# Patient Record
Sex: Male | Born: 1955
Health system: Western US, Academic
[De-identification: ages and names within clinical notes are randomized; demographics above are authoritative.]

---

## 2012-01-09 DIAGNOSIS — R269 Unspecified abnormalities of gait and mobility: Secondary | ICD-10-CM

## 2012-03-11 DIAGNOSIS — Z952 Presence of prosthetic heart valve: Secondary | ICD-10-CM

## 2019-12-23 IMAGING — MR COL LOMBAR^ROTINAS
5 series · 35 of 48 positions shown · non-contrast
Comparison: none

[Series 2: sagital_t2 · sagittal · 4.0mm · 0.65mm/px · 8 of 12 slices shown]
[im 1/12]
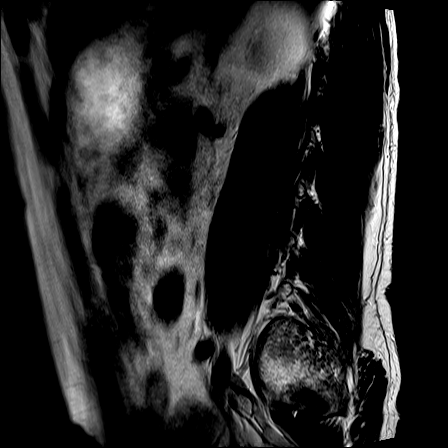
[im 2/12]
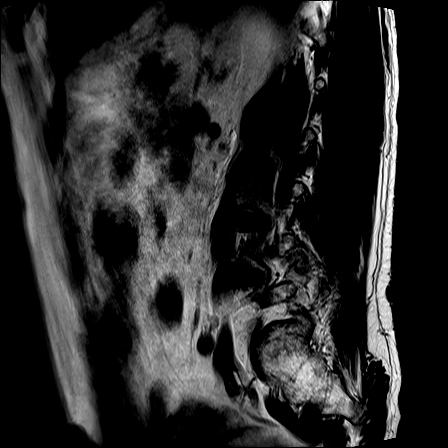
[im 4/12]
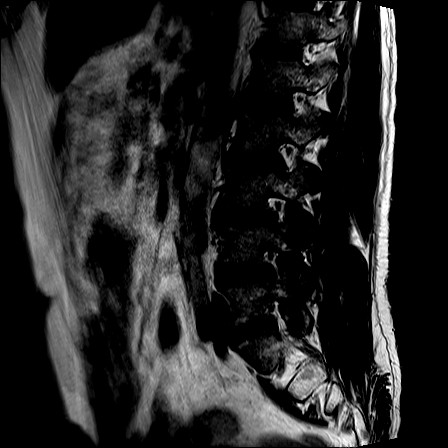
[im 5/12]
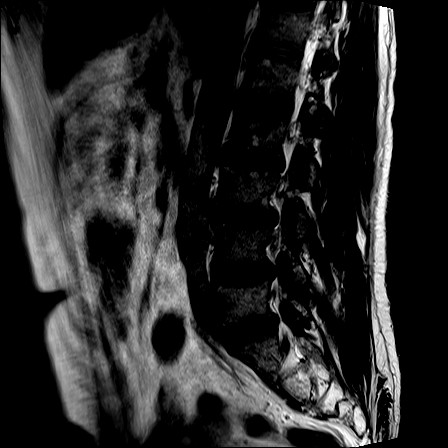
[im 7/12]
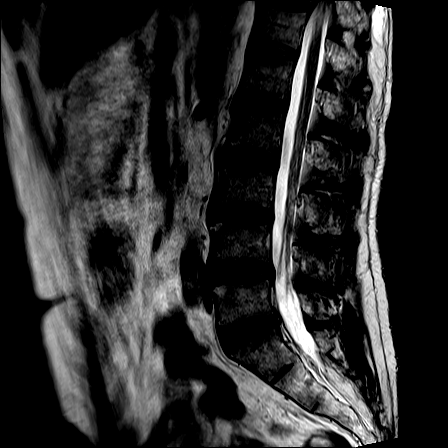
[im 8/12]
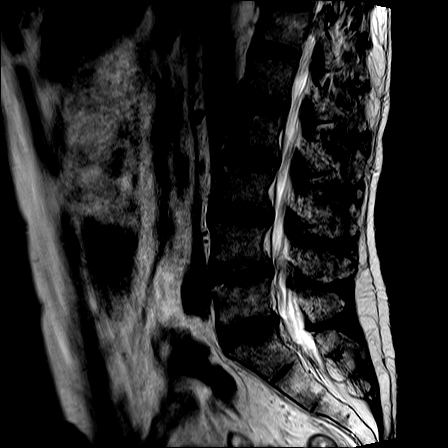
[im 10/12]
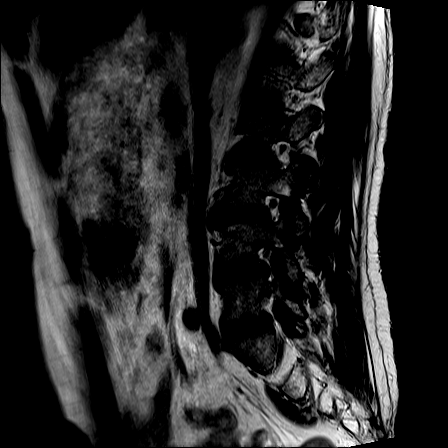
[im 12/12]
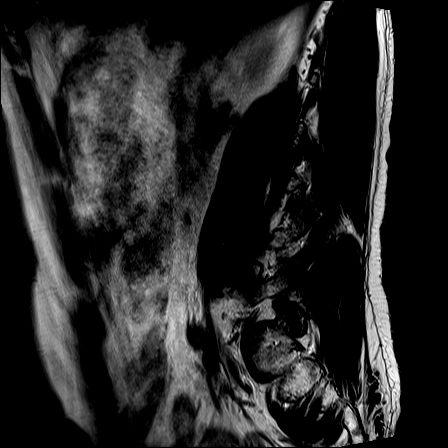

[Series 3: sagital_t1 · sagittal · 4.0mm · 0.76mm/px · 8 of 12 slices shown]
[im 1/12]
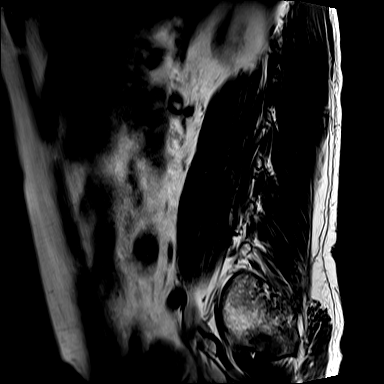
[im 2/12]
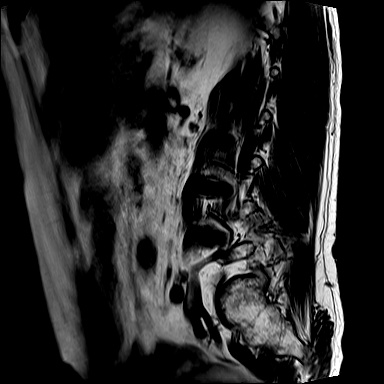
[im 4/12]
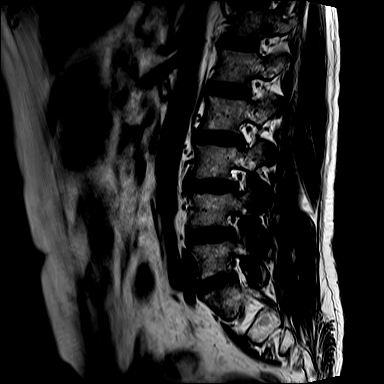
[im 5/12]
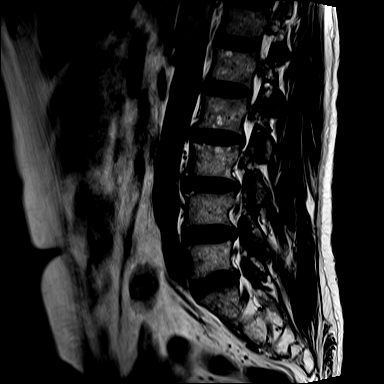
[im 7/12]
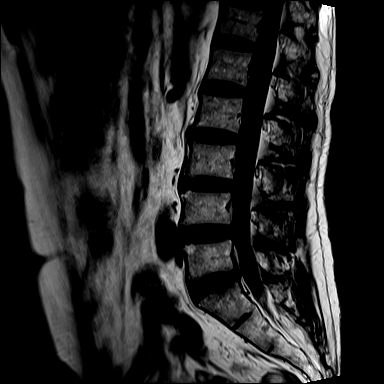
[im 8/12]
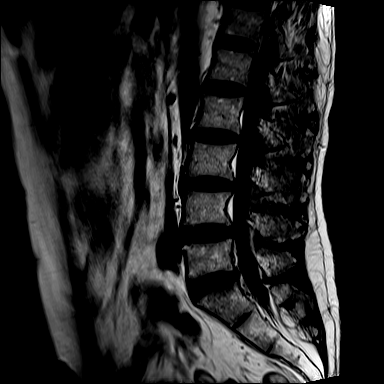
[im 10/12]
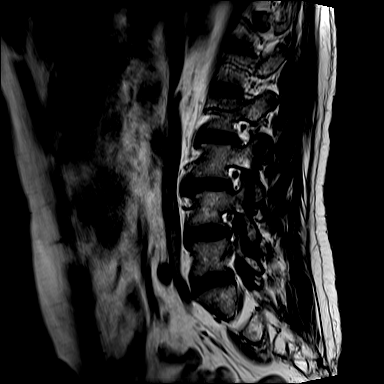
[im 12/12]
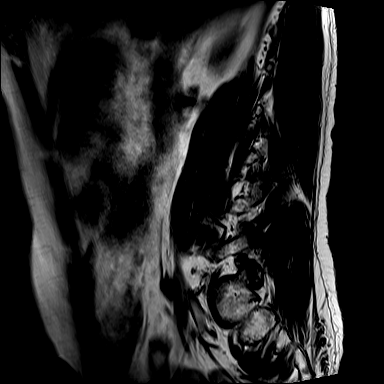

[Series 4: axial_t2_bloco · axial · 4.0mm · 0.62mm/px · z∈[-71,+62]mm · 10 of 30 slices shown]
[im 2/30]
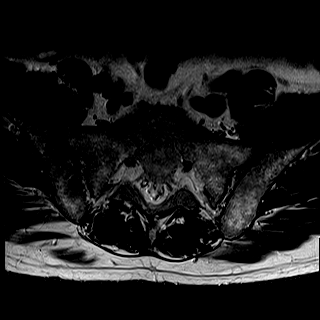
[im 6/30]
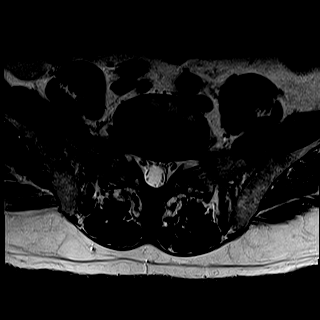
[im 9/30]
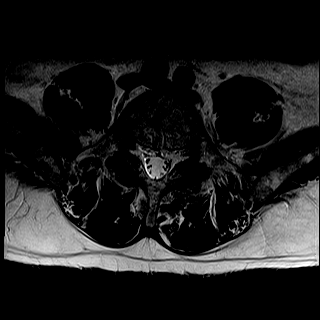
[im 12/30]
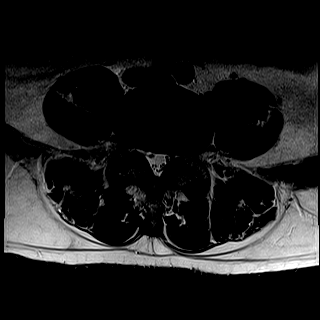
[im 16/30]
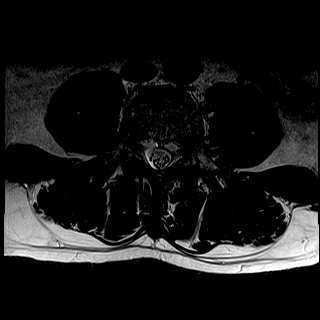
[im 18/30]
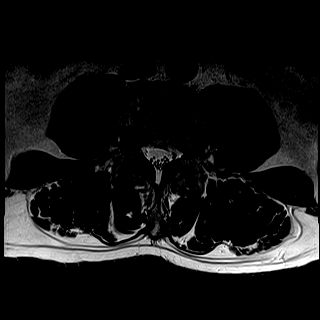
[im 21/30]
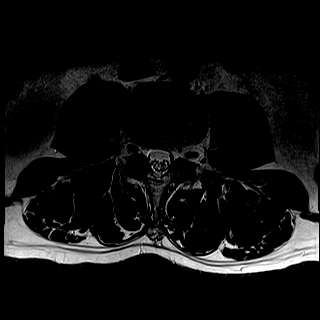
[im 24/30]
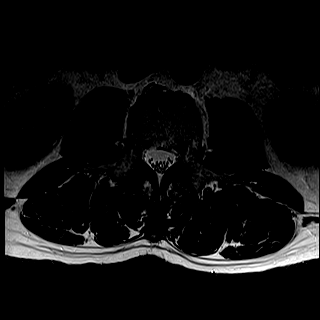
[im 26/30]
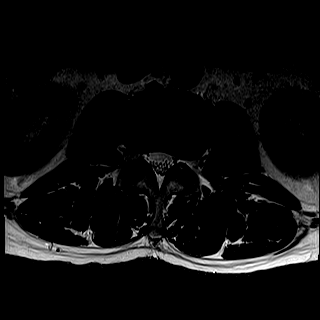
[im 28/30]
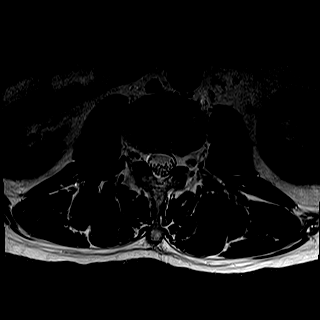

[Series 5: sagital_stir · sagittal · 4.0mm · 0.91mm/px · 7 of 12 slices shown]
[im 1/12]
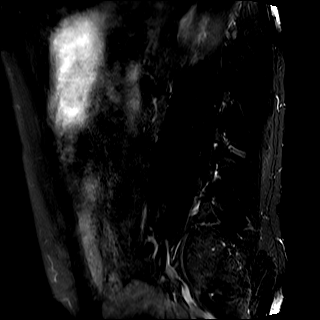
[im 2/12]
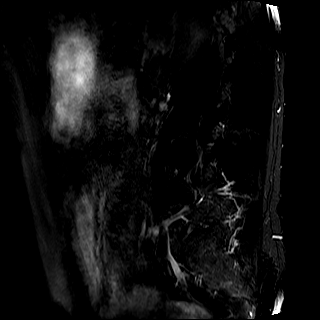
[im 4/12]
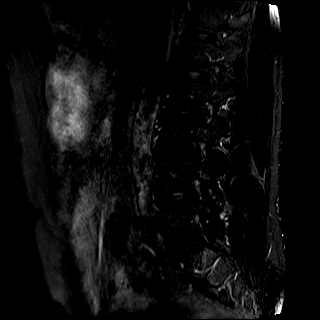
[im 6/12]
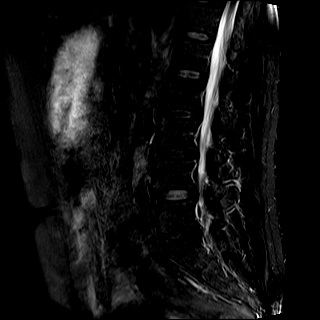
[im 8/12]
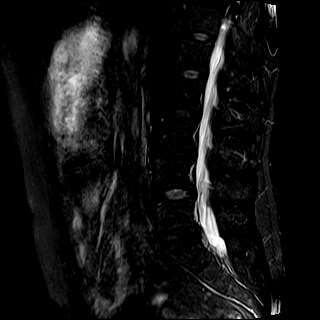
[im 10/12]
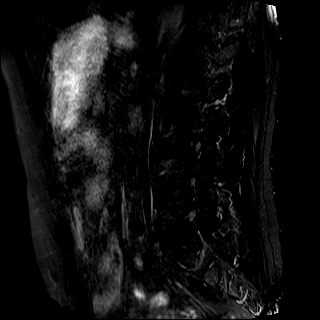
[im 12/12]
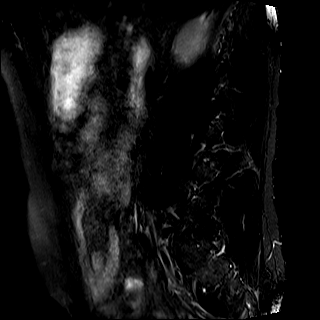

[Series 6: coronal_t2 · coronal · 4.0mm · 0.71mm/px · 2 of 12 slices shown]
[im 1/12]
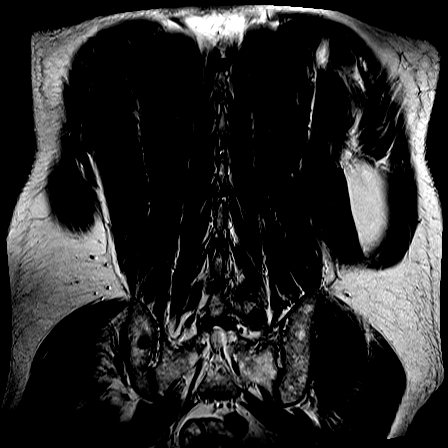
[im 2/12]
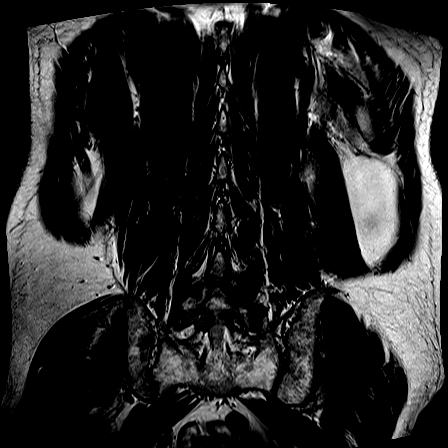

[35 of 48 positions shown; findings below may reference images not displayed]

METODOLOGIA:
Exame  realizado  com  sequências  ponderadas  em  T1  e  T2,  sem  a  administração  intravenosa  do  agente  de  contraste 
paramagnético.
ANÁLISE:
O canal vertebral exibe dimensões normais por toda extensão avaliada.
Os corpos vertebrais apresentam altura e alinhamento posterior preservados.
Discretas reações osteofitarias marginais anteriores.
RESSONÂNCIA MAGNÉTICA DA COLUNA LOMBOSSACRA
Desidratação discal em L2-L3, L3-L4, L4-L5 e L5-S1.
Protrusão discal posterior e difusa em L4-L5 que determina leve compressão sobre a face ventral do saco dural, com 
componentes ínferoforaminais, reduzindo de maneira discreta a amplitude dos respectivos forames de conjugação, sem 
aparentes repercussões radiculares.
Discreta protrusão discal posterior em L5-S1 que retifica a face ventral do saco dural, com fissuras do ânulo fibroso. 
Sinais de artrose facetaria bilateralmente em L3-L4, L4-L5 e L5-S1.
Demais forames de conjugação livres.
O cone medular é tópico, sendo de calibre e intensidade de sinal normais.
Raízes nervosas da cauda eqüina de morfologia e distribuição anatômicas.
IMPRESSÃO:
Avaliação por ressonância magnética da coluna lombossacra evidenciando:
Sinais de espondilodiscopatia degenerativa.
Protrusão discal posterior e difusa em L4-L5 que determina leve compressão sobre a face ventral do saco dural, com 
componentes ínferoforaminais, reduzindo de maneira discreta a amplitude dos respectivos forames de conjugação, sem 
aparentes repercussões radiculares.
Discreta protrusão discal posterior em L5-S1 que retifica a face ventral do saco dural, com fissuras do ânulo fibroso. 
Artrose facetaria bilateralmente em L3-L4, L4-L5 e L5-S1.

## 2020-02-07 IMAGING — MR QUADRIL^ESQUERDO
6 of 7 series · 44 of 48 positions shown · non-contrast
Comparison: none

[Series 2: coronal_dp_fs · coronal · left · 3.5mm · 0.62mm/px · 6 of 20 slices shown]
[im 1/20]
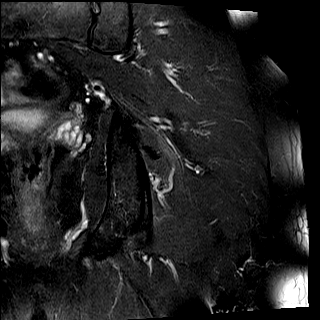
[im 4/20]
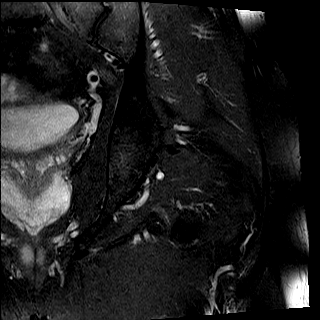
[im 8/20]
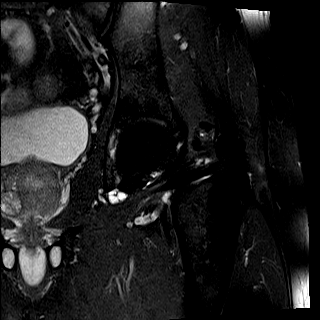
[im 12/20]
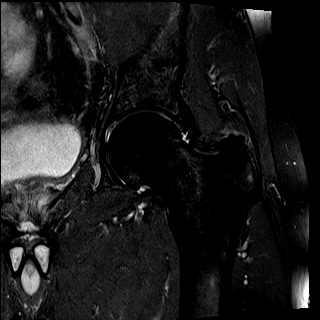
[im 16/20]
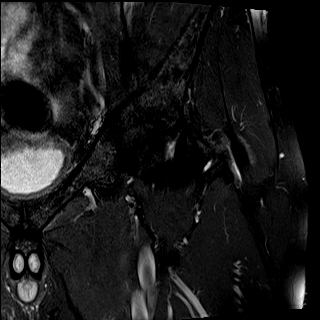
[im 20/20]
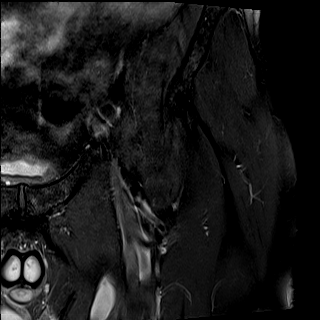

[Series 3: coronal_t1 · coronal · left · 3.5mm · 0.62mm/px · 7 of 20 slices shown]
[im 1/20]
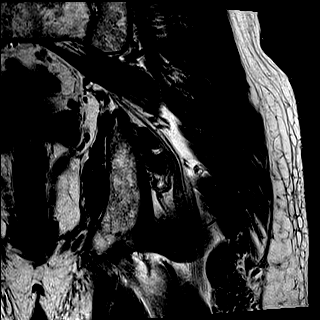
[im 4/20]
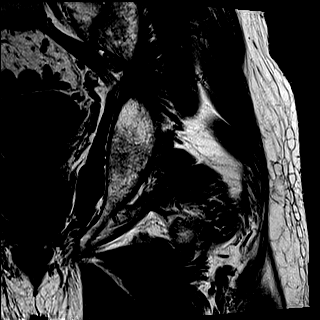
[im 7/20]
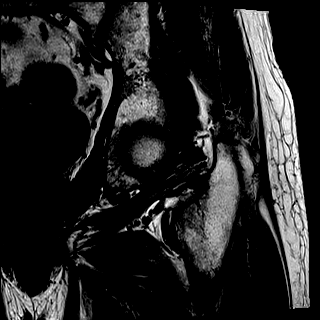
[im 10/20]
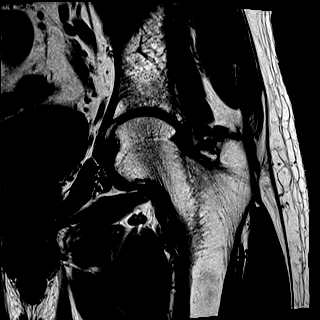
[im 13/20]
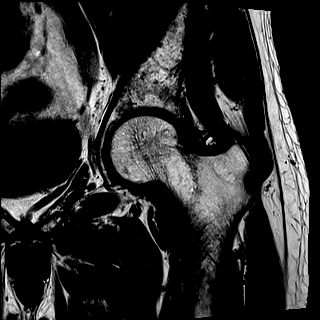
[im 16/20]
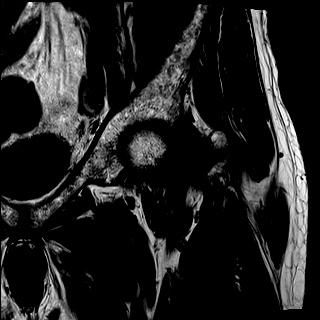
[im 20/20]
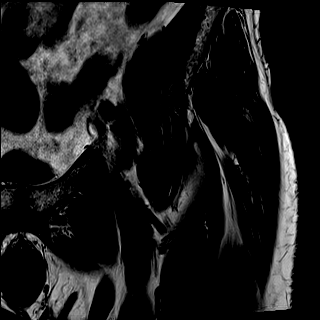

[Series 4: axial_dp_fs · axial · left · 3.5mm · 0.62mm/px · z∈[-57,+44]mm · 9 of 25 slices shown]
[im 1/25]
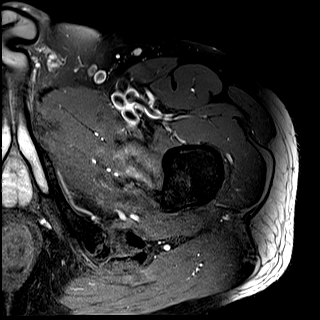
[im 4/25]
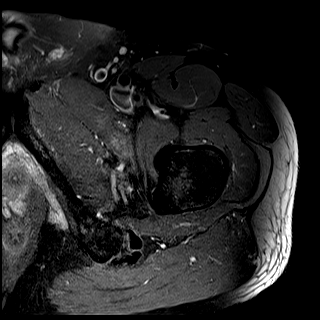
[im 7/25]
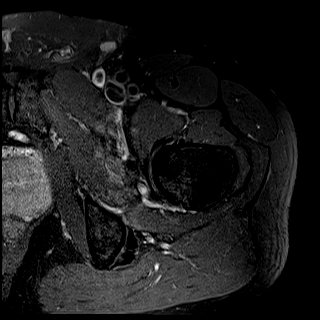
[im 10/25]
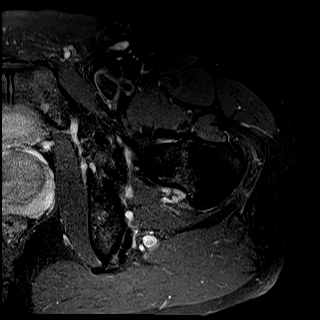
[im 13/25]
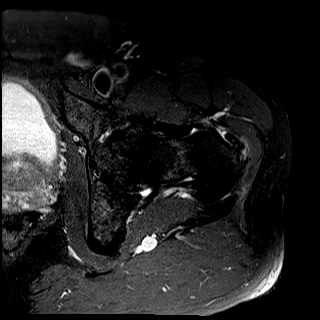
[im 16/25]
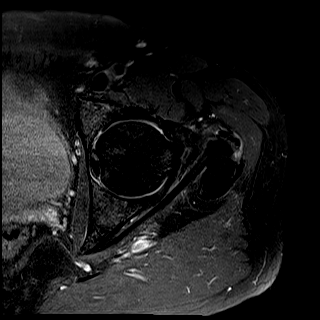
[im 19/25]
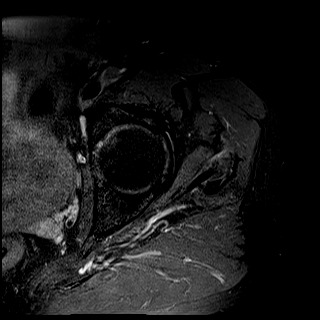
[im 22/25]
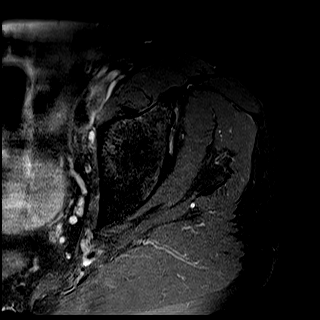
[im 25/25]
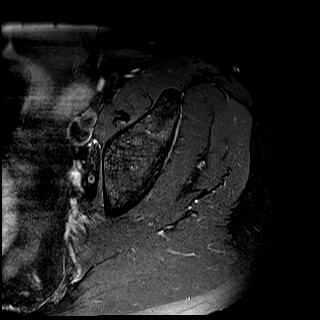

[Series 5: sagital_dp_fs · sagittal · left · 4.0mm · 0.62mm/px · 7 of 20 slices shown]
[im 1/20]
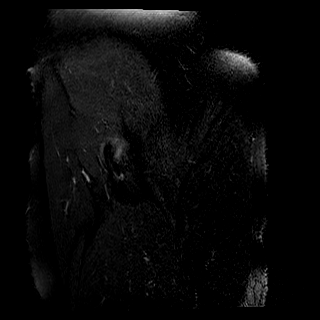
[im 4/20]
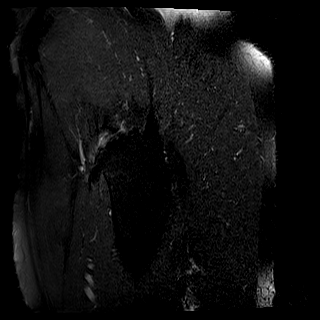
[im 7/20]
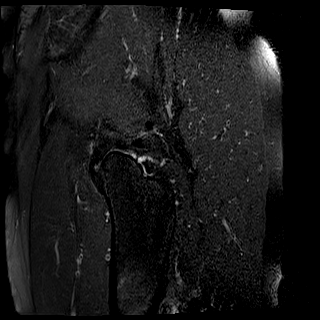
[im 10/20]
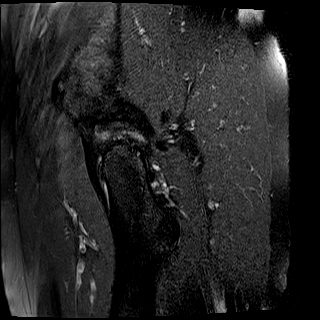
[im 13/20]
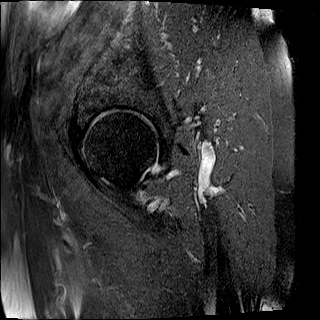
[im 16/20]
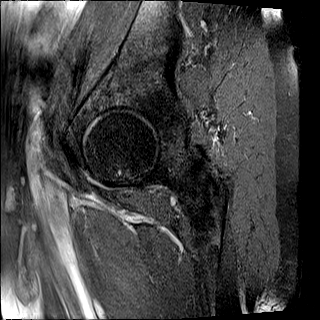
[im 20/20]
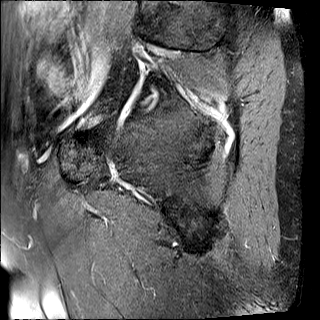

[Series 6: axial_t1 · axial · left · 3.5mm · 0.62mm/px · z∈[-57,+44]mm · 8 of 25 slices shown]
[im 1/25]
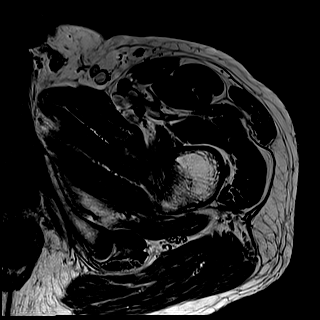
[im 4/25]
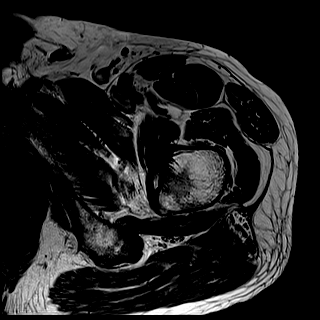
[im 7/25]
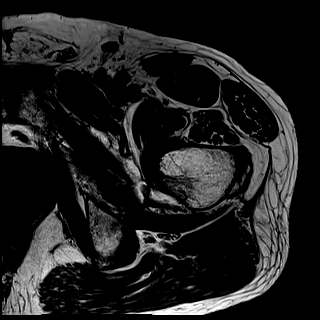
[im 10/25]
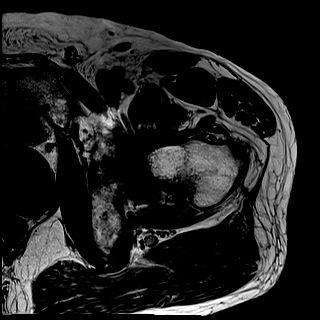
[im 16/25]
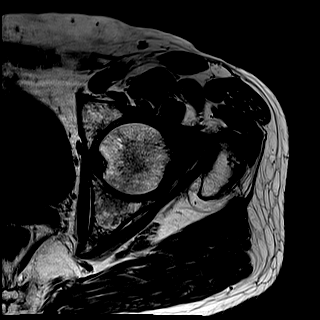
[im 19/25]
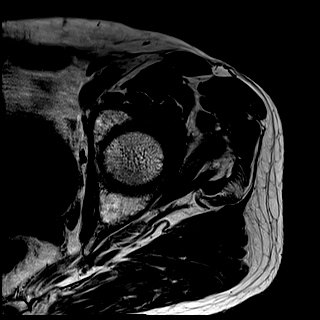
[im 22/25]
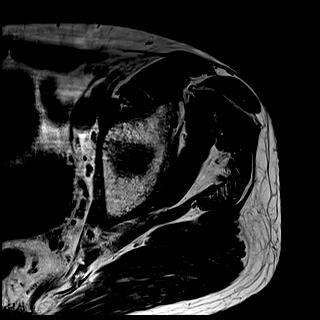
[im 25/25]
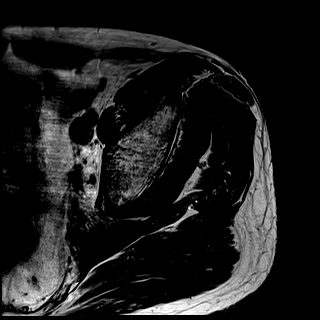

[Series 7: axial_dp_fs_obliquo · axial · left · 3.5mm · 0.62mm/px · z∈[+30,+93]mm · 7 of 20 slices shown]
[im 1/20]
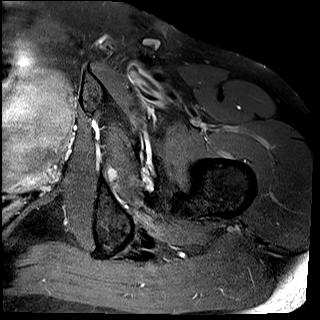
[im 4/20]
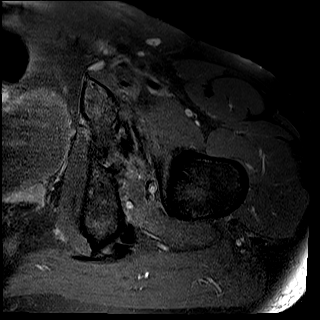
[im 7/20]
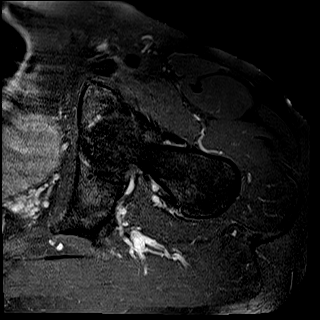
[im 10/20]
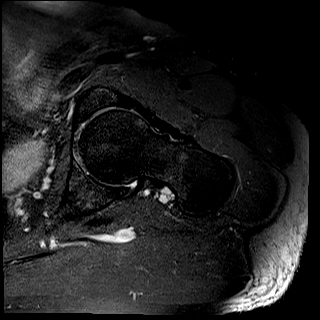
[im 13/20]
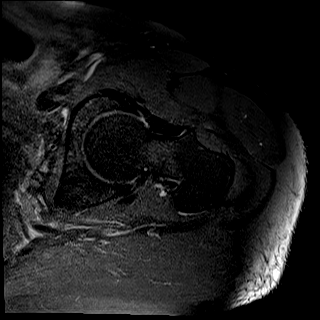
[im 16/20]
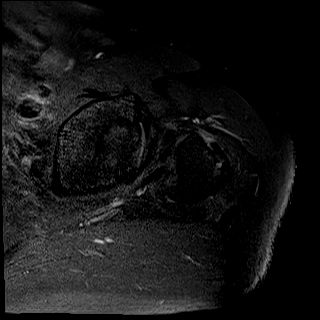
[im 20/20]
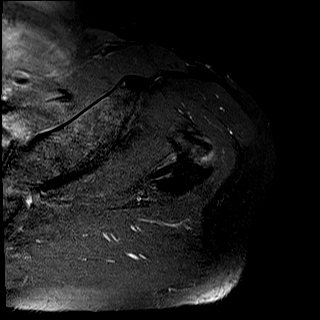

[44 of 48 positions shown; findings below may reference images not displayed]

TÉCNICA: 
Exame realizado com aquisições multiplanares, algumas com supressão de gordura, sem a administração do meio de 
contraste.
ANÁLISE:
Tendinopatia (tendinose) e peritendinite dos glúteos médio e mínimo, com lesão parcial intrassubstancial 
justainsercional do mínimo com 0,7 cm de largura e estendendo-se a junção miotendínea por 1,5 cm, sem desinserções, 
associando-se e distensão líquida das bursas peritrocantéricase e edema das junções miotendíneas, bem como lâminas 
líquidas peri faciais, estendendo-se desde a topografia da bursa trocantérica até a interface anterolateral da aponeurose 
glútea correspondente, adjacente a fascia lata.
RESSONÂNCIA MAGNÉTICA DO QUADRIL ESQUERDO
Tendinopatia e peritendinite da origem dos isquiotibiais, com fissuras intrínsecas na origem do tendão conjunto.
Afilamento condral nas porções posteroinferior, superolateral e superomedial de carga do quadril, com tênue edema 
medular óssea na margem externa acetabular, acompanhado de reações osteofitárias marginais em ambos os 
componentes, determinando aumento da cobertura acetabular. Diminutos osteófitos perifoveais.
Alteração de sinal, irregularidades dos contornos e áreas de redução volumétrica do lábio anterossuperior e 
posterossuperior, sem destacamentos.
Discreto aumento da quantidade de líquido intra-articular e espessamento inespecífico da cápsula articular anterior e 
superior do quadril.
O ângulo alfa mede cerca de 57º, e o ângulo de cobertura acetabular 48º.
Degeneração incipiente inserção foveal do ligamento redondo.
Pequeno derrame articular nos quadris.
Estrias de gordura de permeio a musculatura glútea.
Discretas alterações degenerativas da sacroilíaca esquerda e da sínfise púbica.
Demais estruturas ósseas apresentam corticais íntegras e intensidade de sinal medular usual.
Demais estruturas musculares com intensidade de sinal normal e aspecto simétrico.

## 2020-03-29 ENCOUNTER — Telehealth: Payer: Commercial Managed Care - HMO

## 2020-03-29 NOTE — Telephone Encounter
I contacted this patient by phone to notify them that. Patient did not answer, therefore, I left a voicemail with instructions to call back.     If patient calls back, please provide patient with the following information: Dr Jarrett Ables is no longer accepting new patients. Patients appointment was made in error after the cut off date of 03/02/20, therefore patient's appointment must be CANCELLED. Please pass along the clinics sincerest apologies for any inconvenience this may cause.    Please HELP patient schedule a new appointment w/ another available provider that is accepting new patients. Thank you.

## 2020-04-29 ENCOUNTER — Ambulatory Visit: Payer: Commercial Managed Care - HMO

## 2020-05-02 ENCOUNTER — Ambulatory Visit: Payer: Commercial Managed Care - HMO

## 2020-05-02 DIAGNOSIS — M109 Gout, unspecified: Secondary | ICD-10-CM

## 2020-05-02 DIAGNOSIS — E782 Mixed hyperlipidemia: Secondary | ICD-10-CM

## 2020-05-02 DIAGNOSIS — R350 Frequency of micturition: Secondary | ICD-10-CM

## 2020-05-02 DIAGNOSIS — I639 Cerebral infarction, unspecified: Secondary | ICD-10-CM

## 2020-05-02 DIAGNOSIS — Z952 Presence of prosthetic heart valve: Secondary | ICD-10-CM

## 2020-05-02 DIAGNOSIS — I350 Nonrheumatic aortic (valve) stenosis: Secondary | ICD-10-CM

## 2020-05-02 DIAGNOSIS — Z0000 Encounter for general adult medical examination without abnormal findings: Secondary | ICD-10-CM

## 2020-05-02 DIAGNOSIS — R7303 Prediabetes: Secondary | ICD-10-CM

## 2020-05-02 DIAGNOSIS — I10 Essential (primary) hypertension: Secondary | ICD-10-CM

## 2020-05-02 MED ORDER — ATORVASTATIN CALCIUM 20 MG PO TABS
20 mg | ORAL_TABLET | Freq: Every evening | ORAL | 1 refills | Status: AC
Start: 2020-05-02 — End: ?
  Filled 2020-06-02: qty 90, 90d supply, fill #0

## 2020-05-02 MED ORDER — AMLODIPINE BESYLATE 5 MG PO TABS
5 mg | ORAL_TABLET | Freq: Every day | ORAL | 3 refills | Status: AC
Start: 2020-05-02 — End: ?
  Filled 2020-06-02: qty 90, 90d supply, fill #0

## 2020-05-02 MED ORDER — LOSARTAN POTASSIUM 25 MG PO TABS
25 mg | ORAL_TABLET | Freq: Two times a day (BID) | ORAL | 12 refills | Status: AC
Start: 2020-05-02 — End: ?
  Filled 2020-06-02: qty 180, 90d supply, fill #0

## 2020-05-02 MED ORDER — ALLOPURINOL 100 MG PO TABS
200 mg | ORAL_TABLET | Freq: Every day | ORAL | 1 refills | Status: AC
Start: 2020-05-02 — End: ?
  Filled 2020-06-02: qty 180, 90d supply, fill #0

## 2020-05-02 NOTE — Progress Notes
DEPARTMENT OF MEDICINE INITIAL VISIT NOTE    PATIENT:  Andrew [name]  MRN:  [id number]  DOB:  [birth year]  DATE OF SERVICE:  05/02/2020  PRIMARY CARE PROVIDER: Thayer Headings., MD    Cc:    Chief Complaint   Patient presents with   ??? Establish Care       HPI:    Andrew [name] is a a 65 y.o. male who presents for establishment of care.    H/o bicuspid AV, s/p AVR Jan 2013 c/b peri-op CVA with left sided weakness.    Possible STEMI Mar 2013 with coronary angio with normal coronaries.    A1c 6.02 Nov 2019  LDL C 59 Aug 2021  UA 7.01 Nov 2019  PSA 0.04 Feb 2019  TSH 1.48 Nov 2020    Throbbing pain in the back. BP went up at that time, but improved with pain control with tylenol. Upper back, right side.    Urinary frequency, during day 6-10 times, 3 times at night, no sudden urges, incontinence at night because of difficulty getting to the bathroom, no leakage in between episodes of urge, urine comes out easily, no hesitancy, no stopping or starting.    Does stationary bike, 5 days a week, 20 minutes    Sometimes vegetable salad, heavy meal, not much fried food, always eats vegetable with every meal, cooks with canola oil, chicken and fish, sometimes beef and pork, air fryer     SOCIAL HISTORY:  Social History     Tobacco Use   ??? Smoking status: Former Smoker     Packs/day: 1.00     Start date: 05/03/1975     Quit date: 05/02/1990     Years since quitting: 30.0   ??? Smokeless tobacco: Never Used   Substance Use Topics   ??? Alcohol use: Not Currently     Comment: prior heavy use when he was much younger   ??? Drug use: Not Currently       PAST MEDICAL/SURGICAL HISTORY:   Past Medical History:   Diagnosis Date   ??? CVA (cerebral vascular accident) (HCC/RAF)    ??? H/O aortic valve replacement    ??? Kidney stone    ??? Urinary frequency       Past Surgical History:   Procedure Laterality Date   ??? AORTIC VALVE REPLACEMENT         FAMILY HISTORY:   Family History   Problem Relation Age of Onset   ??? No Known Problems Mother    ??? Other (gastric ulcer) Father    ??? Uterine cancer Sister    ??? Stroke Brother    ??? Kidney failure Brother    ??? Colon cancer Neg Hx    ??? Lung cancer Neg Hx    ??? Prostate cancer Neg Hx        REVIEW OF SYSTEMS:  14 point review of systems was performed which was negative with the exception of that noted in the HPI above.     PHYSICAL EXAMINATION:  Last Recorded Vital Signs:    05/02/20 1448   BP: 118/80   Pulse: 73   Resp: 19   Temp: 36.7 ???C (98 ???F)   SpO2: 97%     Vitals:    05/02/20 1448   Weight: 189 lb (85.7 kg)   Height: 5' 5'' (1.651 m)    Weight Change (lbs): 40.8  Wt Readings from Last 3 Encounters:   05/02/20 189 lb (85.7 kg)   03/11/12  148 lb 3.2 oz (67.2 kg)     Body mass index is 31.45 kg/m???.   General: well developed, in no acute distress  HEENT: sclera anicteric, oropharynx clear, moist mucous membranes  Neck: no jugular venous distention. No lymphadenopathy  Heart: regular rhythm, normal S1, S2. no rubs, murmurs, or gallops  Lungs: clear to auscultation bilaterally  Abd: soft, non tender, non distended  Ext: no cyanosis or edema  Skin: warm, dry, and well perfused  Psych: normal affect      LABORATORY/DIAGNOSTIC STUDIES:    All labs and diagnostic imaging were reviewed in care connect.     ---------------------------------------------------------------------------------------------------------------------    ASSESSMENT/PLAN:    Encounter Diagnoses and Orders    1. Aortic valve stenosis, etiology of cardiac valve disease unspecified  Referral to Cardiology   2. Primary hypertension     3. History of aortic valve replacement  Referral to Cardiology   4. Prediabetes     5. Chronic gout without tophus, unspecified cause, unspecified site     6. Mixed hyperlipidemia     7. Urinary frequency  Referral to Urology, Adult Urology   8. Cerebrovascular accident (CVA), unspecified mechanism (HCC/RAF)  Referral to Neurology, Stroke   9. H/O aortic valve replacement     10. Preventative health care  Hgb A1c - HPLC    Lipid Panel    Uric Acid    PSA,Screening    CBC & Plt & Diff    Comprehensive Metabolic Panel    Lipoprotein (a)    Apolipoprotein Evaluation    Fecal Occult Blood Immunoassay (FIT)       Updated Problem List    1. Aortic valve stenosis, etiology of cardiac valve disease unspecified  - Referral to Cardiology    2. Primary hypertension  Continue coreg, losartan. Controlled    3. History of aortic valve replacement  - Referral to Cardiology    4. Prediabetes  Check A1c    5. Chronic gout without tophus, unspecified cause, unspecified site  Check UA. Continue allopurinol.    6. Mixed hyperlipidemia  Continue statin. Recheck now    7. Urinary frequency  - Referral to Urology, Adult Urology    8. Cerebrovascular accident (CVA), unspecified mechanism (HCC/RAF)  Continue aspirin/statin  - Referral to Neurology, Stroke    9. H/O aortic valve replacement  Cardiology. Continue aspirin/statin    10. Preventative health care  Lifestyle discussed in detail  - Hgb A1c - HPLC; Future  - Lipid Panel; Future  - Uric Acid; Future  - PSA,Screening; Future  - CBC & Plt & Diff; Future  - Comprehensive Metabolic Panel; Future  - Lipoprotein (a); Future  - Apolipoprotein Evaluation; Future  - Fecal Occult Blood Immunoassay (FIT); Future      Follow-up: Return in about 3 months (around 07/30/2020).    The above plan of care, diagnosis, orders, and follow-up were discussed with the patient.  Questions related to this recommended plan of care were answered.  See AVS for additional information and counseling materials provided to the patient.    Author: Thayer Headings 05/02/2020 4:06 PM  [facility] Extensivist Physician  Department of Medicine

## 2020-05-02 NOTE — Patient Instructions
   Blood tests fasting    Stool test   Referrals to cardiology, urology, neurology

## 2020-05-10 ENCOUNTER — Telehealth: Payer: Commercial Managed Care - HMO

## 2020-05-10 NOTE — Telephone Encounter
New Neurology Specialty Intake    Reason for visit:  o Confirmed Diagnosis  YES [x]     I63.9 (ICD-10-CM) - Cerebrovascular accident (CVA), unspecified mechanism (HCC/RAF)   NO []   o Symptoms: Left side weakness    Have you seen a Neurologist before?  o YES [x]      NO []   o Name of Neurologist:Dr.  o Date: 10.13.21  o Reason for previous Neurology visit:Sleep Study    Are you self referring?     o YES []   NO [x]   o If no, is the referral in Care Connect? YES [x]   NO []   o Name of referring physician:Dr.  o Specialty of referring physician:PCP   Are pertinent records in Care Connect?  o YES [x]   o NO  []   Advised patient to fax records within 24 hours to: [phone]   Is a specific program being requested?     o YES [x]  Neu StrokeNO []    o Is a specific provider being requested?   YES []   ___________ NO [x]    For MS or Stroke  Were MRI Images Received?  YES []   NO []   N/A [x]      Reason for routing to coordinator pool:   o (Check box) [If at least 3 questions checked, route to coordinator pool]  - Diagnosis is appropriate: [x]   - Records in Care Connect: [x]   - Referral in Care Connect: [x]   - Patient requested a specific provider or subspecialty program? [x] 

## 2020-05-12 ENCOUNTER — Ambulatory Visit: Payer: PRIVATE HEALTH INSURANCE

## 2020-05-12 DIAGNOSIS — R350 Frequency of micturition: Secondary | ICD-10-CM

## 2020-05-12 DIAGNOSIS — R3915 Urgency of urination: Secondary | ICD-10-CM

## 2020-05-12 DIAGNOSIS — I639 Cerebral infarction, unspecified: Secondary | ICD-10-CM

## 2020-05-12 NOTE — Progress Notes
PATIENT:  Andrew [name]  MRN:  [id number]  DOB:  [birth year]  DATE OF SERVICE:  05/12/2020    REFERRING PRACTITIONER: Thayer Headings., MD  PRIMARY CARE PROVIDER: Thayer Headings., MD    REASON FOR REFERRAL: urinary frequency     CHIEF COMPLAINT: same    Subjective:     History of Present Illness:  Andrew [name] is a 65 y.o. 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 Denies any sudden urges or incontinence, leakage in between episodes of urge. These symptoms were previously attributed to BPH/OAB; he had been previously seen by an outside urologist, Dr. Lindie Spruce in June 2016. Previously on trospium 60mg  po qday.    Today, his wife confirms the history as above- his most bothersome complaint is urinary frequency. At night, he gets up to urinate at least 2x/nightly, previously 5x. In the daytime, he also urinates about 5x. This does not particularly disturb his sleep. Force of stream is good. He is no longer taking trospium, discontinued on advice from a previous urologist. Currently, he does not have any issues with urinary incontinence, although he sometimes has issues holding in urine as it is slower for him to get to the bathroom.    He is able to ambulate with a walker. Currently in a wheelchairdue to previous CVA in 2013 with residual left-sided weakness.  He can however walk with a walker    He is relatively active and rides a stationary bike for exercise, 5 days per week, 20 minutes daily.    PSA History:  02/28/19 - 0.4   04/28/20 - 0.4    MEDS  Outpatient Medications Prior to Visit   Medication Sig   ??? acetaminophen (TYLENOL) 325 mg tablet Take 650 mg by mouth every six (6) hours as needed. SKD TEST   ??? acetaminophen (TYLENOL) 500 mg tablet Take 500 mg by mouth every six (6) hours as needed.   ??? allopurinol 100 mg tablet Take 2 tablets (200 mg total) by mouth daily.   ??? amLODIPine 5 mg tablet Take 1 tablet (5 mg total) by mouth daily.   ??? aspirin 81 mg EC tablet Take 81 mg by mouth daily.   ??? atorvastatin 20 mg tablet Take 1 tablet (20 mg total) by mouth at bedtime.   ??? carvedilol (COREG) 6.25 mg tablet Take 1 tablet (6.25 mg total) by mouth two (2) times daily.   ??? FOLIC ACID PO Take 1,000 mcg by mouth daily.   ??? losartan 25 mg tablet Take 1 tablet (25 mg total) by mouth two (2) times daily.   ??? Multiple Vitamins-Minerals (HCA SUPER THERAVITE-M PO) Take by mouth daily.     No facility-administered medications prior to visit.       PMH:  Past Medical History:   Diagnosis Date   ??? CVA (cerebral vascular accident) (HCC/RAF)    ??? H/O aortic valve replacement    ??? Kidney stone    ??? Urinary frequency         PSH:  Past Surgical History:   Procedure Laterality Date   ??? AORTIC VALVE REPLACEMENT          Allergies:  No Known Allergies    FAM HX:   Family History   Problem  Relation Age of Onset   ??? No Known Problems Mother    ??? Other (gastric ulcer) Father    ??? Uterine cancer Sister    ??? Stroke Brother    ??? Kidney failure Brother    ??? Colon cancer Neg Hx    ??? Lung cancer Neg Hx    ??? Prostate cancer Neg Hx        Social HX:  Social History     Socioeconomic History   ??? Marital status: Married     Spouse name: Not on file   ??? Number of children: Not on file   ??? Years of education: Not on file   ??? Highest education level: Not on file   Occupational History   ??? Not on file   Tobacco Use   ??? Smoking status: Former Smoker     Packs/day: 1.00     Start date: 05/03/1975     Quit date: 05/02/1990     Years since quitting: 30.0   ??? Smokeless tobacco: Never Used   Substance and Sexual Activity   ??? Alcohol use: Not Currently     Comment: prior heavy use when he was much younger   ??? Drug use: Not Currently   ??? Sexual activity: Not on file   Other Topics Concern   ??? Not on file   Social History Narrative   ??? Not on file     Social Determinants of Health     Physical Activity: Not on file   Stress: Not on file   Financial Resource Strain: Not on file       Review of Systems:  Review of Systems: A 14 point review of systems was negative.  Pertinent items are noted in HPI.    Objective:     There were no vitals filed for this visit.    Physical Exam:  Constitutional: Patient is oriented. Appears well-developed and well-nourished.  HENT:   Head: Normocephalic and atraumatic.   Neck: Normal range of motion. Neck supple.  Pulmonary/Chest: Effort normal  Abdominal: Soft., Nontender, no masses   Musculoskeletal:  Left-sided weakness, patient has difficulty ambulating and uses a wheelchair  Neurological:  alert and oriented.   Skin: Skin is warm and dry.   Psychiatric: normal mood and affect and behavior. Judgment and thought content normal.  GU:  Both testes are descended and are small.  Phallus is normal.  There are no inguinal hernias.  Anal sphincter tone is normal.  Prostate is about 30 g smooth benign-feeling    Procedures:  Results for orders placed or performed in visit on 05/12/20   URO IN CLINIC UROFLOWMETRY   Result Value Ref Range    Peak Flow (Uroflow)      Mean Flow (Uroflow)      Voiding Time (Uroflow)      Flow Time (Uroflow)      Time to peak flow (Uroflow)      Voided Volume (Uroflow)      Voiding Time (Flowstar) 79.3 s    Flow Time (Flowstar) 31.8 s    Time peakflow (Flowstar) 7.1 s    Peak flowrate (Flowstar) 17.3 ml/s    Average Flowrate (Flowstar) 6.2 ml/s    Intervals (Flowstar) 6     Voideded Volume (Flowstar) 198 ml   URO IN CLINIC PVR   Result Value Ref Range    Urine Volume 65 ml     Normal bell-shaped curve    Assessment:   Andrew [name]  is a 65 y.o. male with history of prior STEMI 06/2011, bicuspid AV, hypertension, dyslipidemia, previous CVA in 2013 with left hemiparesis, kidney stones, pre-diabetes presenting here to establish urologic care.  He has an unremarkable physical exam with respect to GU tract.  The  His most bothersome complaint is urinary frequency. With normal UF and PVR of 65cc today, he is emptying well, more likely related to OAB than enlargement of the prostate.     I explained to the patient and his wife that is current number of voids per day, about 5 per day and 2 at night, is within the range of normal for his age.  He does not appear to have obstructive voiding based on his uroflow.  If he were bothered by urgency we could consider treating him for overactive bladder and therefore.  I offered to start him on an OAB medication to help with his lower urinary tract symptoms, however the patient declines and prefers to maintain a course of watchful waiting which is certainly reasonable.      He will contact me if he has any new concerns, and otherwise return in 6 months for recheck.    Plan/ Recommendation:     1. RTC in 6 months for symptoms reassessment    Medical Decision Making:  The patient's urinary frequency after CVA is a(n) stable chronic illness. Prior records, labs, and imaging were reviewed and analyzed. Tests were ordered as listed above. There is a moderate risk of morbidity from additional testing or treatment. Based on these criteria, the patient is of moderate complexity.     Sissy Hoff, MD  Professor of Urology and Obstetrics and Gynecology  [employer] at [facility] SIGNATURE:  I, Chestine Spore, have assisted Dr. Sissy Hoff, MD with the documentation for Andrew [name] on 05/11/2020.

## 2020-05-16 ENCOUNTER — Telehealth: Payer: Commercial Managed Care - HMO

## 2020-05-16 NOTE — Telephone Encounter
PDL Call to Practice    Reason for Call: Pt wife following up on scheduling    Appointment Related?  []  Yes  [x]  No     If yes;  Date:  Time:    Call warm transferred to PDL: [x]  Yes  []  No    Call Received by Practice Representative: Maricela

## 2020-05-16 NOTE — Telephone Encounter
CALLED [facility] TO INFORM THAT REFERRALS COMPLETED AND provided dept phone numbers

## 2020-05-16 NOTE — Telephone Encounter
Call Back Request    Reason for call back: [name] wife, Andrew [name], called and wanted to see if there is anyway that Dr. Raquel James or his team could please help update the referrals that were submitted, especially, for the referral to the Neuro Stroke team. Andrew [name] said that the patient needs to schedule, she called Neurology and they told her that they cannot scheduled, unless the referral is completed. At the moment, it is showing incomplete.     Andrew [name] is requesting a callback request once the referrals are completed.     Thank you.     Any Symptoms:  []  Yes  [x]  No       If yes, what symptoms are you experiencing:    o Duration of symptoms (how long):    o Have you taken medication for symptoms (OTC or Rx):      Patient or caller has been notified of the 24-48 hour turnaround time.

## 2020-05-16 NOTE — Telephone Encounter
Call Back Request      Reason for call back: Pt's spouse [name] called to follow up on appt request. [name] states she has a called a few times and has yet to hear back. In the meantime [name] will follow up to have auth completed. Please reach out to [facility] for scheduling, thank you.    Any Symptoms:  []  Yes  [x]  No       If yes, what symptoms are you experiencing:    o Duration of symptoms (how long):    o Have you taken medication for symptoms (OTC or Rx):      Patient or caller has been notified of the 24-48 hour turnaround time.

## 2020-05-17 NOTE — Telephone Encounter
Please assist with scheduling

## 2020-05-20 NOTE — Telephone Encounter
Patients spouse calling for assistance to schedule in stroke clinic

## 2020-06-01 MED ORDER — CARVEDILOL 6.25 MG PO TABS
6.25 mg | ORAL_TABLET | Freq: Two times a day (BID) | ORAL | 2 refills | Status: AC
Start: 2020-06-01 — End: ?

## 2020-06-03 ENCOUNTER — Telehealth: Payer: PRIVATE HEALTH INSURANCE

## 2020-06-03 NOTE — Telephone Encounter
Patient called to follow up on Intake. I advised to follow up Tuesday. I sent the referral in for urgent review.

## 2020-06-03 NOTE — Telephone Encounter
Opened in error

## 2020-06-07 ENCOUNTER — Ambulatory Visit: Payer: Commercial Managed Care - HMO | Attending: Cardiovascular Disease

## 2020-06-07 DIAGNOSIS — I2510 Atherosclerotic heart disease of native coronary artery without angina pectoris: Secondary | ICD-10-CM

## 2020-06-07 DIAGNOSIS — Z952 Presence of prosthetic heart valve: Secondary | ICD-10-CM

## 2020-06-07 NOTE — Telephone Encounter
Patient was scheduled this morning with Dr. Trixie Deis fellow on 4/21 at 10:30 am via zoom.

## 2020-06-07 NOTE — Progress Notes
Cardiology Clinic    PATIENT: Andrew [name]  MRN: [id number]  DOB: 09-14-1955  DATE OF SERVICE: 06/07/2020      REFERRING PHYSICIAN: Thayer Headings., MD    REASON FOR VISIT:  History of aortic valve replacement, initial consultation    HISTORY OF PRESENT ILLNESS:  Andrew [name] is a delightful 65 year old gentleman with past medical history significant for bicuspid aortic valve status post aortic valve replacement in January of 2013 with perioperative CVA, chronic ST elevation in inferior leads, hypertension.  He was referred to our Cardiology Clinic by Dr. Tora Duck to establish care.  I reviewed his previous records in detail.    Andrew [name] has a history of bicuspid aortic valve.  In January 2013 he underwent aortic valve replacement with bioprosthetic valve at [facility].  Unfortunately, after the procedure he had CVA with residual left leg paresis.  In the perioperative period he was found to have ST elevations in the inferior leads for which he underwent coronary angiogram which was normal.  He had repeat angiogram in May of 2013 for similar findings.  He also has a history of hypertension for which he is on amlodipine and losartan.    Andrew [name] reports of doing well from cardiovascular standpoint.  He denies of having any chest pain, shortness of breath, orthopnea or paroxysmal nocturnal dyspnea.  Furthermore, he denies of having any dizziness, palpitations or lower extremity edema.  His blood pressure has been well controlled at home.    Andrew [name] is originally from [place]  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  He lives in [place].  He stopped smoking 40 years ago.  He drinks alcohol seldomly in social situations.  There is no history of illicit drug use.  His family history significant for brother who had stroke in his 77s.    PAST MEDICAL HISTORY:  Patient Active Problem List   Diagnosis   ??? Abnormality of gait   ??? Hx of medication noncompliance   ??? Muscle weakness (generalized)   ??? 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)   ??? Aortic stenosis   ??? History of aortic valve replacement   ??? Hypertension   ??? Gout   ??? Seborrheic dermatitis   ??? Urinary frequency   ??? CVA (cerebral vascular accident) (HCC/RAF)   ??? H/O aortic valve replacement   ??? Bicuspid aortic valve       PAST SURGICAL HISTORY:  Past Surgical History:   Procedure Laterality Date   ??? AORTIC VALVE REPLACEMENT         ALLERGIES:  No Known Allergies    SOCIAL HISTORY:  Social History     Tobacco Use   ??? Smoking status: Former Smoker     Packs/day: 1.00     Start date: 05/03/1975     Quit date: 05/02/1990     Years since quitting: 30.1   ??? Smokeless tobacco: Never Used   Substance Use Topics   ??? Alcohol use: Not Currently     Comment: prior heavy use when he was much younger   ??? Drug use: Not Currently       FAMILY HISTORY:  Family History   Problem Relation Age of Onset   ??? No Known Problems Mother    ??? Other (gastric ulcer) Father    ??? Uterine cancer Sister    ??? Stroke Brother    ??? Kidney failure Brother    ???  Colon cancer Neg Hx    ??? Lung cancer Neg Hx    ??? Prostate cancer Neg Hx        REVIEW OF SYSTEMS:  All 14 systems were reviewed and were negative except as mentioned in the HPI    PHYSICAL EXAMINATION:  VITALS: BP 108/74  ~ Pulse 73  ~ Temp 36.5 ???C (97.7 ???F) (Forehead)  ~ Resp 16  ~ Ht 5' 5'' (1.651 m)  ~ Wt 193 lb 8 oz (87.8 kg)  ~ SpO2 94% Comment: RA ~ BMI 32.20 kg/m???    General: Alert and oriented, not in acute distress.  HEENT: Normocephalic, atraumatic. Sclerae are anicteric.   Neck: Supple, no lymphadenopathy, JVP flat  Respiratory: Clear to auscultation bilaterally, no wheezing or rales.   Cardiac: RRR, S1/S2 normal, no murmurs, rubs or gallops. Peripheral pulses present.  Abdomen: Soft, non-tender, non-distended, normoactive bowel sounds.  Extremities: No cyanosis, clubbing or edema.    Integument: Warm, no petechiae. No acute rash.  Neurologic/Psychiatric: Alert and oriented. Affect is appropriate.       OUTPATIENT MEDICATIONS:  Current Outpatient Medications   Medication Sig   ??? acetaminophen (TYLENOL) 325 mg tablet Take 650 mg by mouth every six (6) hours as needed. SKD TEST   ??? acetaminophen (TYLENOL) 500 mg tablet Take 500 mg by mouth every six (6) hours as needed.   ??? allopurinol 100 mg tablet Take 2 tablets (200 mg total) by mouth daily.   ??? amLODIPine 5 mg tablet Take 1 tablet (5 mg total) by mouth daily.   ??? aspirin 81 mg EC tablet Take 81 mg by mouth daily.   ??? atorvastatin 20 mg tablet Take 1 tablet (20 mg total) by mouth at bedtime.   ??? carvedilol 6.25 mg tablet Take 1 tablet (6.25 mg total) by mouth two (2) times daily.   ??? FOLIC ACID PO Take 1,000 mcg by mouth daily.   ??? losartan 25 mg tablet Take 1 tablet (25 mg total) by mouth two (2) times daily.   ??? Multiple Vitamins-Minerals (HCA SUPER THERAVITE-M PO) Take by mouth daily.     No current facility-administered medications for this visit.       LABORATORY:  No results found for: WBC, HGB, HCT, MCV, PLT   Lab Results   Component Value Date    CREAT 0.7 03/11/2012    BUN 16 03/11/2012    NA 141 03/11/2012    K 4.0 03/11/2012    CL 105 03/11/2012    CO2 28 03/11/2012    No results found for: HGBA1C   Lab Results   Component Value Date    ALT 34 03/11/2012    AST 27 03/11/2012    ALKPHOS 67 03/11/2012    BILITOT 0.6 03/11/2012    No results found for: TSH   Lab Results   Component Value Date    CALCIUM 10.0 03/11/2012      Lab Results   Component Value Date    CHOL 111 03/11/2012    CHOLHDL 39 03/11/2012    CHOLDLQ 61 03/11/2012           DIAGNOSTIC STUDIES:  ECHO 04/20/2019:  1. Left ventricle is normal in size with mild hypertrophy. Left ventricular   function is preserved and visually estimated to be 60-65%. There are no wall   motion abnormalities.   2. Normal biatrial???size.   3. Normal right ventricular size and systolic function.   4. Bioprosthetic valve (25-mm Carpentier-Edwards Magna Ease ;  1/13; Dr. Charyl Dancer)   is well seated in the aortic position. Peak velocity of 2.26 m/sec, mean   gradient of 11 mmHg, DI of 0.46, effective orifice area of 1.44 cm^2 (based   on LVOT diameter of 2cm)--consistent with normal prosthetic function.   5. Lack of an adequate TR signal precludes accurate estimation of right   ventricular systolic pressure. IVC diameter is = 2.1 cm with a > 50%   inspiratory collapse, suggestive of a right atrial pressure of 0-5 mmHg.     ECG 06/07/2020:  Normal sinus rhythm at ventricular rate of 74 beats per minute.  Mild ST elevation in inferior leads.  Described before.     []  Reviewed/ordered radiology  []  Independently interpreted studies    [x]  Reviewed/ordered labs  [x]  Reviewed & summarized old records    [x]  Reviewed/ordered diag med test   []  Decided to get outside medical records      []  Obtained history from someone other than patient    ASSESSMENT/PLAN:  1. Aortic valve replacement in 2013  Normal valve function on most recent echocardiogram in January 2021  Repeat echocardiogram prior to our next office visit.    We discussed that some degeneration of the bioprosthetic valve can be expected 10 years after surgery so he will need close follow-up with serial echocardiograms  Continue aspirin    2. Bicuspid aortic valve  We discussed with there is an increased risk of having aortic aneurysm in patients with bicuspid aortic valve  He will need screening with CT angiogram.  I will await lab results prior to ordering this test to make sure that kidney function is normal  First-degree family members should be screened with echocardiograms    3. History of CVA  Residual leg weakness    4. Hyperlipidemia  On atorvastatin    5. Abnormal EKG  Patient has ST elevations in the inferior leads which have been present before.  I reviewed the records.  No further testing is necessary in this regard.  He had 2 negative coronary angiograms    Please note, I spent a total of 60 minutes for this visit including chart review, test interpretation, history taking, physical examination, counseling, coordination of care and documentation.    Plan of care was discussed with the patient and family in detail. All questions answered.    Charlton Amor, MD  Advanced Heart Failure and Transplantation Cardiology  Pager [phone]

## 2020-06-07 NOTE — Telephone Encounter
PDL Call to Practice    Reason for Call: [name] spouse [name] calling back to follow up on encounter below. Caller would like to know if she can please schedule new patient appointment.    Appointment Related?[x] ? Yes  [] ? No    If yes;  Date: TBD  Time:    Call warm transferred to PDL: [x] ? Yes  [] ? No    Call Received by Practice Representative: 

## 2020-06-07 NOTE — Telephone Encounter
PDL Call to Practice    Reason for Call: [name] spouse [name] calling back to follow up on encounter below. Caller would like to know if she can please schedule new patient appointment.    Appointment Related?  [x]  Yes  []  No     If yes;  Date: TBD  Time:    Call warm transferred to PDL: [x]  Yes  []  No    Call Received by Practice Representative: 

## 2020-06-09 DIAGNOSIS — Q231 Congenital insufficiency of aortic valve: Secondary | ICD-10-CM

## 2020-06-09 DIAGNOSIS — I10 Essential (primary) hypertension: Secondary | ICD-10-CM

## 2020-06-09 DIAGNOSIS — Q2381 Bicuspid aortic valve: Secondary | ICD-10-CM

## 2020-06-09 DIAGNOSIS — I639 Cerebral infarction, unspecified: Secondary | ICD-10-CM

## 2020-07-19 ENCOUNTER — Ambulatory Visit: Payer: Commercial Managed Care - Pharmacy Benefit Manager

## 2020-07-21 ENCOUNTER — Telehealth: Payer: Commercial Managed Care - HMO

## 2020-07-21 DIAGNOSIS — I639 Cerebral infarction, unspecified: Secondary | ICD-10-CM

## 2020-07-21 NOTE — Progress Notes
Stroke Neurology Clinic Initial Visit - Teleneurology Encounter    The encounter was conducted via HIPAA-compliant Zoom conferencing due to the ongoing COVID-19 pandemic.  The patient's identity was verified using two unique identifiers.  The patient agreed to having the visit conducted virtually via Zoom prior to beginning the encounter.      DATE OF SERVICE: 07/21/2020  Andrew [name] MRN: [id number], DOB: 07/03/1955   REF Provider Thayer Headings., MD PCP: Thayer Headings., MD     Andrew [name] is a 65 y.o. right-handed male with a history of HTN, bicuspid aortic valve s/p replacement (Jan 2013) with perioperative ischemic stroke resulting in left leg weakness who presents to stroke clinic for evaluation.    Today, patient's partner reports having the stroke in 2013 after heart valve procedure. He had a seizure following the procedure, has since stopped keppra and has not any recurrence of episodes. His left side was affected by the stroke. He had trouble getting up and sitting initially but now he can walk with a walker, but only short distances, poor balance.     Prior imaging is not available today.    Patient is interested in physical therapy.  ???  Social History per prior records:  ''Mr. Gentle is originally from [place]  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  He lives in [place].  He stopped smoking 40 years ago.  He drinks alcohol seldomly in social situations.  There is no history of illicit drug use.  His family history significant for brother who had stroke in his 24s.''     =========================  Collapsible Objective section includes meds, problem list, history, vitals, exam, labs, imaging  Objective (click to expand/collapse)      Review of Systems: 14 point review of systems is as above in HPI or negative/non-contributory.    Active Problems/PMH:  Patient Active Problem List   Diagnosis   ??? Abnormality of gait   ??? Hx of medication noncompliance   ??? Muscle weakness (generalized)   ??? 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)   ??? Aortic stenosis   ??? History of aortic valve replacement   ??? Hypertension   ??? Gout   ??? Seborrheic dermatitis   ??? Urinary frequency   ??? CVA (cerebral vascular accident) (HCC/RAF)   ??? H/O aortic valve replacement   ??? Bicuspid aortic valve     Past Medical History:   Diagnosis Date   ??? CVA (cerebral vascular accident) (HCC/RAF)    ??? H/O aortic valve replacement    ??? Kidney stone    ??? Urinary frequency        Medications:  Current Outpatient Medications (Pain Meds)   Medication Sig Dispense Refill   ??? acetaminophen (TYLENOL) 325 mg tablet Take 650 mg by mouth every six (6) hours as needed. SKD TEST     ??? acetaminophen (TYLENOL) 500 mg tablet Take 500 mg by mouth every six (6) hours as needed.     ??? allopurinol 100 mg tablet Take 2 tablets (200 mg total) by mouth daily. 180 tablet 1   ??? aspirin 81 mg EC tablet Take 81 mg by mouth daily.       Current Outpatient Medications (Cardiac Meds)   Medication Sig Dispense Refill   ??? amLODIPine 5 mg tablet Take 1 tablet (5 mg total) by mouth daily. 90 tablet 3   ??? atorvastatin 20 mg tablet Take 1 tablet (20 mg  total) by mouth at bedtime. 90 tablet 1   ??? carvedilol 6.25 mg tablet Take 1 tablet (6.25 mg total) by mouth two (2) times daily. 180 tablet 2   ??? losartan 25 mg tablet Take 1 tablet (25 mg total) by mouth two (2) times daily. 180 tablet 12     Current Outpatient Medications (Nutrition)   Medication Sig Dispense Refill   ??? Multiple Vitamins-Minerals (HCA SUPER THERAVITE-M PO) Take by mouth daily.       Current Outpatient Medications (Heme Meds)   Medication Sig Dispense Refill   ??? FOLIC ACID PO Take 1,000 mcg by mouth daily.          Allergies: No Known Allergies     Family History   Problem Relation Age of Onset   ??? No Known Problems Mother    ??? Other (gastric ulcer) Father    ??? Uterine cancer Sister    ??? Stroke Brother    ??? Kidney failure Brother    ??? Colon cancer Neg Hx    ??? Lung cancer Neg Hx    ??? Prostate cancer Neg Hx        Social History     Socioeconomic History   ??? Marital status: Married     Spouse name: Not on file   ??? Number of children: Not on file   ??? Years of education: Not on file   ??? Highest education level: Not on file   Occupational History   ??? Not on file   Tobacco Use   ??? Smoking status: Former Smoker     Packs/day: 1.00     Start date: 05/03/1975     Quit date: 05/02/1990     Years since quitting: 30.2   ??? Smokeless tobacco: Never Used   Substance and Sexual Activity   ??? Alcohol use: Not Currently     Comment: prior heavy use when he was much younger   ??? Drug use: Not Currently   ??? Sexual activity: Not on file   Other Topics Concern   ??? Not on file   Social History Narrative   ??? Not on file     Social Determinants of Health     Physical Activity: Not on file   Stress: Not on file   Financial Resource Strain: Not on file        Vitals: There were no vitals taken for this visit.    Physical Exam  General:  Well nourished, well developed. Awake and alert.  NAD.  HEENT: Normocephalic, atraumatic, EOMI, nares patent, oropharynx clear  Pulm: Breathing easily on room air.   Extremities: Moving both upper extremities throughout the encounter.  Derm: No rashes noted on exposed skin surfaces that are visible on the video.    Neurologic:   Mental Status: Awake and alert.  Oriented to self, age, month, year, and place.  Language: Speech is fluent and without evidence of dysarthria or aphasia.  Naming and repetition intact.  Attention and Concentration: Good attention and concentration throughout.      Cranial Nerves:  CN II: VFF  CN III, IV, V: EOMI.   CN V: sensation intact V1-V3 to light touch  CN VII: Face is symmetric with symmetric smile.  CN VIII: Hearing is grossly intact.  CN IX, X: Unable to assess due to the limitations of teleneurology encounter.  CN XI: Turns head from side to side without difficulty.  Equal bilateral shoulder shrug.  CN XII: Tongue protrudes midline.  Extremities: No drift of the bilateral upper extremities    Neurologic Data:   Personally reviewed by me--  No results found for this or any previous visit.    Laboratory:           =========================  Assessment/Plan   Treyden Hakim is a 65 y.o. right-handed male with a history of HTN, bicuspid aortic valve s/p replacement (Jan 2013) with perioperative ischemic stroke resulting in left leg weakness who presents to stroke clinic for evaluation. Patient had periprocedural stroke in 2013 with no recurrent episodes. Currently on ASA 81mg . Patient has some contractures and using orthotics. We will refer to neuro-rehab and PT for further evaluation of functional impairment.    Recommendations as follows:  - referral to Neuro-rehab  - referral to Physical therapy  - continue ASA  - Goal BP <130/80 mmHg  - goal LDL <70  - goal HgbA1c <7%  - defer long term management of chronic secondary risk factors to PMD - goals as outlined above.    - counseled and encouraged healthy eating with a focus on a Mediterranean type diet  - counseled and encouraged regular exercise  - counseled the patient that should new signs or symptoms concerning for stroke occur, they should present immediately to the nearest emergency room for evaluation  - all questions answered to the patient's satisfaction prior to the conclusion of the visit.      Time spent: 25 minutes was spent with the patient, and more than half the time was spent on counseling or coordinating care.  An additional 37 minutes was spent prior to the encounter reviewing the chart, available imaging, labs, and reports in preparation for today's clinical encounter.    Case discussed with supervising attending physician, Dr. Sharman Crate, who also saw and evaluated the patient via HIPAA-compliant Zoom web-conferencing.    Charles B. Estelle Grumbles, MD  Vascular Neurology/Stroke Fellow  07/21/2020 10:20 AM    Cc:   Thayer Headings., MD  Thayer Headings., MD Thank you for this interesting consult. Total time spent 70 minutes, including chart review, face-to-face visit, care coordination and charting work on day of visit. We performed face to face counseling on risks, benefits, alternatives, and side effects of medications, treatments, and interventions pertaining to the patient's diagnoses. Mr. Maden has had all questions answered satisfactorily and is in agreement with this recommended plan of care. Expressed understanding of these and our contact information was given should further questions arise.    Author:  Teodora Medici, MD, [facility] 07/21/2020 2:18 PM

## 2020-08-01 ENCOUNTER — Ambulatory Visit: Payer: Commercial Managed Care - HMO

## 2020-08-01 DIAGNOSIS — Z0000 Encounter for general adult medical examination without abnormal findings: Secondary | ICD-10-CM

## 2020-08-01 DIAGNOSIS — I639 Cerebral infarction, unspecified: Secondary | ICD-10-CM

## 2020-08-01 DIAGNOSIS — I10 Essential (primary) hypertension: Secondary | ICD-10-CM

## 2020-08-01 DIAGNOSIS — R7303 Prediabetes: Secondary | ICD-10-CM

## 2020-08-01 DIAGNOSIS — R350 Frequency of micturition: Secondary | ICD-10-CM

## 2020-08-01 NOTE — Progress Notes
DEPARTMENT OF MEDICINE PROGRESS NOTE    PATIENT:  Andrew [name]  MRN:  [id number]  DOB:  06/10/1955  DATE OF SERVICE:  08/01/2020  PRIMARY CARE PROVIDER: Thayer Headings., MD    Cc:    Chief Complaint   Patient presents with   ??? Aortic Valve Stenosis   ??? Hyperlipidemia   ??? Hypertension       HPI:    Andrew [name] is a a 65 y.o. male who presents for establishment of care.    05/02/20:  H/o bicuspid AV, s/p AVR Jan 2013 c/b peri-op CVA with left sided weakness.  Possible STEMI Mar 2013 with coronary angio with normal coronaries.  A1c 6.02 Nov 2019  LDL C 59 Aug 2021  UA 7.01 Nov 2019  PSA 0.04 Feb 2019  TSH 1.48 Nov 2020  Throbbing pain in the back. BP went up at that time, but improved with pain control with tylenol. Upper back, right side.  Urinary frequency, during day 6-10 times, 3 times at night, no sudden urges, incontinence at night because of difficulty getting to the bathroom, no leakage in between episodes of urge, urine comes out easily, no hesitancy, no stopping or starting.  Does stationary bike, 5 days a week, 20 minutes  Sometimes vegetable salad, heavy meal, not much fried food, always eats vegetable with every meal, cooks with canola oil, chicken and fish, sometimes beef and pork, air fryer    08/01/20: Urination is OK. Exercising more 3 times a week, stationary bike, 20 minutes. Eating rice and other carbs.     SOCIAL HISTORY:  Social History     Tobacco Use   ??? Smoking status: Former Smoker     Packs/day: 1.00     Start date: 05/03/1975     Quit date: 05/02/1990     Years since quitting: 30.2   ??? Smokeless tobacco: Never Used   Substance Use Topics   ??? Alcohol use: Not Currently     Comment: prior heavy use when he was much younger   ??? Drug use: Not Currently       PAST MEDICAL/SURGICAL HISTORY:   Past Medical History:   Diagnosis Date   ??? CVA (cerebral vascular accident) (HCC/RAF)    ??? H/O aortic valve replacement    ??? Kidney stone    ??? Urinary frequency       Past Surgical History:   Procedure Laterality Date   ??? AORTIC VALVE REPLACEMENT         FAMILY HISTORY:   Family History   Problem Relation Age of Onset   ??? No Known Problems Mother    ??? Other (gastric ulcer) Father    ??? Uterine cancer Sister    ??? Stroke Brother    ??? Kidney failure Brother    ??? Colon cancer Neg Hx    ??? Lung cancer Neg Hx    ??? Prostate cancer Neg Hx        REVIEW OF SYSTEMS:  14 point review of systems was performed which was negative with the exception of that noted in the HPI above.     PHYSICAL EXAMINATION:  Last Recorded Vital Signs:    08/01/20 1242   BP: 104/62   Pulse: 67   Resp: 20   Temp: 36.3 ???C (97.4 ???F)   SpO2: 99%     Vitals:    08/01/20 1242   Weight: 198 lb 8 oz (90 kg)   Height: 5' 5'' (1.651 m)  Weight Change (lbs): 5  Wt Readings from Last 3 Encounters:   08/01/20 198 lb 8 oz (90 kg)   06/07/20 193 lb 8 oz (87.8 kg)   05/02/20 189 lb (85.7 kg)     Body mass index is 33.03 kg/m???.   GEN: NAD, conversive.   HEENT: anicteric sclerae, moist mucous membranes  PULM: no increased work of breathing, no audible wheezes  SKIN: No visible rash  PSYCH: normal affect and insight/mood, alert and oriented.      LABORATORY/DIAGNOSTIC STUDIES:    All labs and diagnostic imaging were reviewed in care connect.     ---------------------------------------------------------------------------------------------------------------------    ASSESSMENT/PLAN:    Encounter Diagnoses and Orders    1. Preventative health care  Fecal Occult Blood Immunoassay   2. Prediabetes     3. Cerebrovascular accident (CVA), unspecified mechanism (HCC/RAF)     4. Primary hypertension     5. Urinary frequency     6. Bicuspid aortic valve         Updated Problem List    #Aortic valve stenosis, etiology of cardiac valve disease unspecified  #History of aortic valve replacement  #H/O aortic valve replacement  Continue asa/statin. F/u Dr Burnard Bunting    #Primary hypertension  Continue coreg, losartan. Controlled    #Prediabetes  Diet/exercise discussed    #Chronic gout without tophus, unspecified cause, unspecified site  Check UA. Continue allopurinol.    #Mixed hyperlipidemia  Continue statin. At goal. Discussed lp(a) as risk marker.    #Urinary frequency  F/u Dr Aletha Halim. Improved.    #Cerebrovascular accident (CVA), unspecified mechanism (HCC/RAF)  Continue aspirin/statin. F/u Dr Sharman Crate. Referred for neuro-rehab and PT.    FIT given to him  PSA nl Apr 2022    Follow-up: Return in about 3 months (around 11/01/2020).    The above plan of care, diagnosis, orders, and follow-up were discussed with the patient.  Questions related to this recommended plan of care were answered.  See AVS for additional information and counseling materials provided to the patient.    Author: Thayer Headings 08/01/2020 12:57 PM  Scalp Level Extensivist Physician  Department of Medicine

## 2020-08-01 NOTE — Nursing Note
DOCUMENTATION FOR FIT TEST KIT     Fecal Occult Blood Immunoassay (FIT) test kit was provided to the patient by Abi Shoults C Alpha Mysliwiec, LVN at 1:10 PM   Beaker label was placed on the sampling bottle   Instructed the patient to write the collection date and time on the printed FIT test laboratory requisition form   Explained the collection and mailing process to the patient

## 2020-08-01 NOTE — Patient Instructions
To cut back on carbs, make sure at least half of your plate is non-starchy vegetables such as salad greens, tomatoes, peppers, onions, mushrooms, jicama, cucumbers, cabbage, brussel sprouts, asparagus, and many others. Avoid starchy vegetables such as potatoes, peas, and corn. Another quarter of your plate should be meat or tofu for protein. The final quarter of your plate can be from complex carbs. Complex carbs include whole grain breads, brown rice, lentils, quinoa, etc. These digest much more slowly, which will leave you satisfied for longer.    High Carb Foods (REDUCE):  ??? Potatoes  ??? Rice  ??? Pasta  ??? Bread  ??? Tortillas  ??? Sodas and Fruit Juices  ??? Sweets  ??? Pastries/Bakery Goods    Be careful of hidden sugar in products such as pasta sauce, yoghurt, salad dressing, breakfast cereal, and condiments such as ketchup.    Low Carb Foods (GOOD!):  ??? Meat (Lean Meats like Chicken are Better)  ??? Tofu  ??? Fish  ??? Non-Starchy Vegetables  ??? Eggs  ??? Nuts (except Peanuts)  ??? Avocados  ??? Berries  ??? Plant Fats like Olive Oil    Snack Ideas (Low Carbohydrate):  ??? [place] jerky  ??? Cottage cheese with tomatoes and cucumbers  ??? Raw vegetables with salsa  ??? 1/4 cup mixed nuts  ??? Celery or bell pepper slices with almond butter  ??? Hard boiled egg  ??? Scoop of tuna salad and a medium tomato  ??? Ralph's carbohydrate master yogurt  ??? Toasted seaweed snacks  ??? Kale chips  ??? Low fat string cheese  ??? Sunflower seeds  ??? Olives  ??? [place] wrapped in lettuce  ??? Guacamole and veggie strips

## 2020-08-09 ENCOUNTER — Telehealth: Payer: Commercial Managed Care - HMO

## 2020-08-09 ENCOUNTER — Ambulatory Visit: Payer: Commercial Managed Care - HMO

## 2020-08-09 NOTE — Telephone Encounter
New Neurology Specialty Intake    Reason for visit:  o Confirmed Diagnosis  YES [x]                           NO []   o Symptoms: Ischemic stroke     Have you seen a Neurologist before?  o YES [x]      NO []   o Name of Neurologist: Dr.   o Date:   o Reason for previous Neurology visit: same as above    Are you self referring?     o YES []   NO [x]   o If no, is the referral in [facility]? YES [x]   NO []   o Name of referring physician: Dr.  o Specialty of referring physician: neuro   Are pertinent records in [facility]?  o YES [x]   o NO  []   Advised patient to fax records to: [phone]   Is a specific program being requested?     o YES [x]   _____Rehab______ NO []    o Is a specific provider being requested?   YES []   ___________ NO [x]    For MS or Stroke  Were MRI Images Received?  YES []   NO []   N/A [x]      Reason for routing to coordinator pool:   o (Check box) [If at least 3 questions checked, route to coordinator pool]  - Diagnosis is appropriate: [x]   - Records in [facility]: [x]   - Referral in [facility]: [x]   - Patient requested a specific provider or subspecialty program? [x] 

## 2020-08-09 NOTE — Telephone Encounter
Appt sched w/spouse.

## 2020-08-12 ENCOUNTER — Inpatient Hospital Stay: Payer: PRIVATE HEALTH INSURANCE

## 2020-08-12 ENCOUNTER — Inpatient Hospital Stay: Payer: Commercial Managed Care - HMO

## 2020-08-12 ENCOUNTER — Inpatient Hospital Stay: Payer: Commercial Managed Care - Pharmacy Benefit Manager

## 2020-08-12 DIAGNOSIS — R6889 Other general symptoms and signs: Secondary | ICD-10-CM

## 2020-08-12 DIAGNOSIS — Z719 Counseling, unspecified: Secondary | ICD-10-CM

## 2020-08-18 ENCOUNTER — Ambulatory Visit: Payer: Commercial Managed Care - HMO

## 2020-08-18 DIAGNOSIS — G8194 Hemiplegia, unspecified affecting left nondominant side: Secondary | ICD-10-CM

## 2020-08-18 DIAGNOSIS — R2689 Other abnormalities of gait and mobility: Secondary | ICD-10-CM

## 2020-08-18 DIAGNOSIS — I639 Cerebral infarction, unspecified: Secondary | ICD-10-CM

## 2020-08-18 NOTE — Consults
NEURO-REHABILITATION CLINIC CONSULT NOTE  PATIENT:  Andrew [name]  MRN:  [id number]  DOB:  22-Sep-1955  DATE OF SERVICE:  08/18/2020    PHYSICIAN REQUESTING CONSULT: Dr. Sharman Crate  REASON FOR CONSULT: post-stroke rehab needs    Subjective:     History of Present Illness:  41 y RH M w/ a perioperative stroke in Jan 2013 with residual left LE weakness, HTN referred for post-stroke rehab needs.    The patient reports that the main issue at the moment is the gait and balance issue and left LE weakness. He can walk with a walker inside the home but generally uses a WC when going outside. He does only 30 min stationary bike exercises x3/week. He does not do any other exercises and reports gaining weight in the meantime.    Of note, when he first had the stroke, he had complete left hemiplegia. In the first six months, the left arm movements came back to ''almost normal levels''. The gait continued to improve until 2016 when he was able to walk with a walker.    No other complaints. Sleep, bowel/bladder/swallowing ok.      Past Medical History:   Diagnosis Date   ? CVA (cerebral vascular accident) (HCC/RAF)    ? H/O aortic valve replacement    ? Kidney stone    ? Urinary frequency      Past Surgical History:   Procedure Laterality Date   ? AORTIC VALVE REPLACEMENT         Social History:  Lives with wife in [place]. Sometimes goes to [place].   Retired from being Photographer.    Scheduled Meds:  Outpatient Medications Prior to Visit   Medication Sig   ? acetaminophen (TYLENOL) 325 mg tablet Take 650 mg by mouth every six (6) hours as needed. SKD TEST   ? acetaminophen (TYLENOL) 500 mg tablet Take 500 mg by mouth every six (6) hours as needed.   ? allopurinol 100 mg tablet Take 2 tablets (200 mg total) by mouth daily.   ? amLODIPine 5 mg tablet Take 1 tablet (5 mg total) by mouth daily.   ? aspirin 81 mg EC tablet Take 81 mg by mouth daily.   ? atorvastatin 20 mg tablet Take 1 tablet (20 mg total) by mouth at bedtime. ? carvedilol 6.25 mg tablet Take 1 tablet (6.25 mg total) by mouth two (2) times daily.   ? FOLIC ACID PO Take 1,000 mcg by mouth daily.   ? losartan 25 mg tablet Take 1 tablet (25 mg total) by mouth two (2) times daily.   ? Multiple Vitamins-Minerals (HCA SUPER THERAVITE-M PO) Take by mouth daily.     No facility-administered medications prior to visit.       Allergies:  Patient has no known allergies.    Review of Systems:  Fourteen point review of systems negative except as described in HPI.    Objective:   Vitals: BP 107/70  ~ Pulse 72  ~ Temp 36.4 ?C (97.5 ?F)  ~ Ht 5' 5'' (1.651 m)  ~ Wt 195 lb 8 oz (88.7 kg)  ~ BMI 32.53 kg/m?    General: Sitting in WC in no acute distress.   Head: Normocephalic, atraumatic. External ears and nose normal. Oral mucosa moist without exudate or ulceration.  Eyes: Pupils equal, round, reactive to light. Normal sclerae and conjunctivae bilaterally.  Cardiovascular: Regular rate and rhythm.   Musculoskeletal: No joint swelling or edema.  Skin: No rashes or skin  changes.  Psych: Normal mood with congruent affect.    Mental status: Alert and oriented x3. Speech clear and fluent without paraphasias. Answers questions and follows commands appropriately. Serial 7s only one. Fund of knowledge good. Registration and recall 3/3.   Cranial nerves: Visual fields intact. Extraocular movements intact. Facial sensation preserved. Face symmetrical with facial movements preserved. Hearing grossly intact. Palate raise symmetrical. Shoulder shrug/SCM 5/5. Tongue midline.  Motor: Normal bulk and tone. No spasticity. Strength 5/5 throughout except:  Left LE:  HF:4-  HE:4  KE, KF: 4  DF: couldn't evaluate well due to AFO    Reflexes: 2+ and symmetrical throughout.  Sensory: Normal to light touch throughout.  Coordination: No dysmetria on finger to nose   Gait/Station: can stand up, left hemiparetic gait. Especially having difficulty with flexion of the left hip joint.    Data:  No recent data    Assessment/Plan/ Recommendation:   64 y RH M w/ a perioperative stroke in Jan 2013 with residual left LE weakness, HTN referred for post-stroke rehab needs.    # Stroke recovery and left LE weakness. The patient has some movement in the left LE. This is a good sign for further improvement. He needs intense strengthening exercises as well as just regular gait exercises. We discussed extensively the strategies about this. He will walk twice a day each at least 30 min outside the house. I will also refer him to external PT so he can learn techniques for muscle strengthening.  - refer to PT  - start walking exercises as discussed    RTC in 3 months.      Author:  Verdell Carmine, MD 08/18/2020 1:39 PM  A total of 55 minutes were spent on this encounter.

## 2020-08-22 ENCOUNTER — Telehealth: Payer: PRIVATE HEALTH INSURANCE

## 2020-08-22 NOTE — Telephone Encounter
Call Back Request      Reason for call back: [name] wife advised she would like to have patient complete PT at [facility] so she would like a call once referral is placed so she may pick up a physical copy in office.    Wife is hoping to pick up referral on 5/26. Thank you.    CBN: [id number]    Any Symptoms:  []  Yes  []  No       If yes, what symptoms are you experiencing:    o Duration of symptoms (how long):    o Have you taken medication for symptoms (OTC or Rx):      Patient or caller has been notified of the 24-48 hour turnaround time.

## 2020-08-22 NOTE — Telephone Encounter
Called and spoke with patient's spouse. Notified her that the referral is in place but pending auth from [facility]. Andrew [name] notified me that the referral has already been approved. She understands to pick up from the front desk.    Referral in patient pick up area

## 2020-08-31 ENCOUNTER — Ambulatory Visit: Payer: Commercial Managed Care - HMO | Attending: Cardiovascular Disease

## 2020-08-31 ENCOUNTER — Ambulatory Visit: Payer: PRIVATE HEALTH INSURANCE | Attending: Cardiovascular Disease

## 2020-09-01 ENCOUNTER — Telehealth: Payer: PRIVATE HEALTH INSURANCE

## 2020-09-01 NOTE — Telephone Encounter
Orders Request    What is being requested? (Tests, Labs, Imaging, etc.): Covid Test     Reason for the request: Patient experiencing cough and would like to get tested     Where does the patient want to be seen?  [facility]      If outside [place], what is the fax number to the facility?      Has the patient seen their doctor for this matter? No    Last office visit: 08/01/2020    Patient or caller was offered an appointment but declined.    Patient or caller has been notified of the 24-48 hour turnaround time.

## 2020-09-02 ENCOUNTER — Telehealth: Payer: PRIVATE HEALTH INSURANCE

## 2020-09-02 ENCOUNTER — Ambulatory Visit: Payer: Commercial Managed Care - HMO

## 2020-09-02 ENCOUNTER — Ambulatory Visit: Payer: PRIVATE HEALTH INSURANCE

## 2020-09-02 DIAGNOSIS — U071 COVID-19 virus infection: Secondary | ICD-10-CM

## 2020-09-02 MED ORDER — IPRATROPIUM BROMIDE 0.03 % NA SOLN
2 | Freq: Two times a day (BID) | NASAL | 0 refills | Status: AC
Start: 2020-09-02 — End: ?

## 2020-09-02 MED ORDER — NIRMATRELVIR & RITONAVIR 20 X 150 MG & 10 X 100MG PO TBPK
0 refills | Status: AC
Start: 2020-09-02 — End: ?

## 2020-09-02 NOTE — Progress Notes
coRETURN PATIENT TELEPHONE VISIT  Provider has determined an electronic visit is appropriate and safe due to the COVID19 crisis    PATIENT:  Andrew [name]  MRN:  [id number]  DOB:  1955/08/15  DATE OF SERVICE:  09/02/2020  CHIEF COMPLAINT:   Chief Complaint   Patient presents with   ? COVID-19 Symptoms         PRIMARY CARE PROVIDER: Thayer Headings., MD    Pt verbally consented to have a telephone encounter in lieu of an in person appt    Subjective:   Andrew [name] is a 65 y.o. 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 Presents today to discuss the following issue(s):    * COVID positive Thursday    - symptoms started Monday  - cough, runny nose, feverish, change in taste, fatigue     Review of Systems   Per HPI  []  10-point ROS reviewed: normal unless stated in HPI    History:     Patient Active Problem List   Diagnosis   ? Abnormality of gait   ? Hx of medication noncompliance   ? Muscle weakness (generalized)   ? 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 Aortic stenosis   ? History of aortic valve replacement   ? Hypertension   ? Gout   ? Seborrheic dermatitis   ? Urinary frequency   ? CVA (cerebral vascular accident) (HCC/RAF)   ? H/O aortic valve replacement   ? Bicuspid aortic valve     Past Medical History:   Diagnosis Date   ? CVA (cerebral vascular accident) (HCC/RAF)    ? H/O aortic valve replacement    ? Kidney stone    ? Urinary frequency      Current Outpatient Medications   Medication Sig   ? acetaminophen (TYLENOL) 325 mg tablet Take 650 mg by mouth every six (6) hours as needed. SKD TEST   ? acetaminophen (TYLENOL) 500 mg tablet Take 500 mg by mouth every six (6) hours as needed.   ? allopurinol 100 mg tablet Take 2 tablets (200 mg total) by mouth daily.   ? amLODIPine 5 mg tablet Take 1 tablet (5 mg total) by mouth daily.   ? aspirin 81 mg EC tablet Take 81 mg by mouth daily.   ? atorvastatin 20 mg tablet Take 1 tablet (20 mg total) by mouth at bedtime.   ? carvedilol 6.25 mg tablet Take 1 tablet (6.25 mg total) by mouth two (2) times daily.   ? FOLIC ACID PO Take 1,000 mcg by mouth daily.   ? ipratropium 0.03% nasal spray Spray 2 sprays in each nare every twelve (12) hours.   ? losartan 25 mg tablet Take 1 tablet (25 mg total) by mouth two (2) times daily.   ? Multiple Vitamins-Minerals (HCA SUPER THERAVITE-M PO) Take by mouth daily.   ? nirmatrelvir & ritonavir (PAXLOVID) 20 x 150 mg & 10 x 100 mg tablet Take 300 mg nirmatrelvir (two 150 mg tablets) with 100 mg ritonavir (one 100 mg tablet). Take all three tablets together by mouth twice daily for 5 days..     No current facility-administered medications for this visit.     Social History  Socioeconomic History   ? Marital status: Married   Tobacco Use   ? Smoking status: Former Smoker     Packs/day: 1.00     Start date: 05/03/1975     Quit date: 05/02/1990     Years since quitting: 30.3   ? Smokeless tobacco: Never Used   Substance and Sexual Activity   ? Alcohol use: Not Currently     Comment: prior heavy use when he was much younger   ? Drug use: Not Currently     Family History   Problem Relation Age of Onset   ? No Known Problems Mother    ? Other (gastric ulcer) Father    ? Uterine cancer Sister    ? Stroke Brother    ? Kidney failure Brother    ? Colon cancer Neg Hx    ? Lung cancer Neg Hx    ? Prostate cancer Neg Hx        Objective:   Physical Exam:     Vitals: none, given telephone encounter  GEN: Alert, does not sound to be in acute distress  RESP: Speaking in full sentences without difficulty  PSYCH: Affect appropriate  All other aspects deferred due to nature of phone visit  Labs / Imaging   I have:    []  Reviewed/ordered radiology  []  Independently interpreted studies    []  Reviewed/ordered labs  []  Reviewed & summarized old records    []  Reviewed/ordered diag med test   []  Decided to get outside medical records      []  Obtained history from someone other than patient    Lab Visit on 08/18/2020   Component Date Value Ref Range Status   ??? Fecal Occult Blood Immunoassay 08/18/2020 Negative  Negative Final          Assessment & Plan:     Andrew [name] is a 65 y.o. male presenting for telephone visit to discuss   Chief Complaint   Patient presents with   ??? COVID-19 Symptoms         Diagnoses and all orders for this visit:    COVID-19 virus infection  Comments:  1/2 dose of amlodipine for 8 days   hold atorvastatin for 5 days   Orders:  -     nirmatrelvir & ritonavir (PAXLOVID) 20 x 150 mg & 10 x 100 mg tablet; Take 300 mg nirmatrelvir (two 150 mg tablets) with 100 mg ritonavir (one 100 mg tablet). Take all three tablets together by mouth twice daily for 5 days.Marland Kitchen  -     ipratropium 0.03% nasal spray; Spray 2 sprays in each nare every twelve (12) hours.          Follow-up:   Return if symptoms worsen or fail to improve.  The above plan of care, diagnosis, order, and follow-up were discussed with the patient. Questions related to this recommended plan of care were answered.    I spent 15 minutes counseling and coordinating care for the above medical issue via telephone with the consent of the patient.       Raford Pitcher, MD    09/02/2020 9:24 AM  Family Medicine   [facility], [facility]  Clinical Instructor, [facility] [employer]  [address]  [place], [place] [address]  Office: [phone]  Fax: [phone]

## 2020-09-02 NOTE — Telephone Encounter
Patient had a visit via telephone with Urgent Care.

## 2020-09-04 ENCOUNTER — Telehealth: Payer: PRIVATE HEALTH INSURANCE

## 2020-09-04 NOTE — Progress Notes
Extensivist Telephone Call:    CC: left ingrown toe nail    SUBJECTIVE:  Left big toe is red and painful.  Onset of one week.  No fevers.   Unsuccessfully attempted to cut nail  Has not been more painful.   Has not awoken patient from sleep.   No changes in symptoms tonight.  No pus or drainage from area.     Recently diagnosed with COVID and started Paxlovid.   No changes in symptoms.   Patient otherwise comfortable.     IMP/PLAN:  2M with ingrown toe nail without evidence of systemic infection, clinically stable. Query presence of paronychia. Defer antibiotics at this time. Will need in-person exam, which I believe can wait until off COVID isolation. Strict precautions given for new onset fevers.     Recommended level of triage: outpatient  Recommend follow-up: with PCP on Monday    I have spent 10 minutes discussing the above symptoms, providing patient counseling and detailing pertinent anticipatory guidance.

## 2020-09-21 ENCOUNTER — Inpatient Hospital Stay: Payer: PRIVATE HEALTH INSURANCE | Attending: Cardiovascular Disease

## 2020-09-21 ENCOUNTER — Ambulatory Visit: Payer: PRIVATE HEALTH INSURANCE | Attending: Cardiovascular Disease

## 2020-09-21 DIAGNOSIS — Z952 Presence of prosthetic heart valve: Secondary | ICD-10-CM

## 2020-09-21 NOTE — Progress Notes
Cardiology Clinic    PATIENT: Andrew [name]  MRN: [id number]  DOB: Dec 17, 1955  DATE OF SERVICE: 09/21/2020      REFERRING PHYSICIAN: Thayer Headings., MD    REASON FOR VISIT:  History of aortic valve replacement, routine follow-up    HISTORY OF PRESENT ILLNESS:  [name] is a delightful 65 y.o. gentleman with past medical history significant for bicuspid aortic valve status post aortic valve replacement in January of 2013 with perioperative CVA, chronic ST elevation in inferior leads, hypertension.  He was referred to our Cardiology Clinic by Dr. Tora Duck to establish care.  I reviewed his previous records in detail.    [name] has a history of bicuspid aortic valve.  In January 2013 he underwent aortic valve replacement with bioprosthetic valve at [facility].  Unfortunately, after the procedure he had CVA with residual left leg paresis.  In the perioperative period he was found to have ST elevations in the inferior leads for which he underwent coronary angiogram which was normal.  He had repeat angiogram in May of 2013 for similar findings.  He also has a history of hypertension for which he is on amlodipine and losartan.    [name] reports of doing well from cardiovascular standpoint.  He denies of having any chest pain, shortness of breath, orthopnea or paroxysmal nocturnal dyspnea.  Furthermore, he denies of having any dizziness, palpitations or lower extremity edema.  His blood pressure has been well controlled at home.    [name] is originally from [place]  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  He lives in [place].  He stopped smoking 40 years ago.  He drinks alcohol seldomly in social situations.  There is no history of illicit drug use.  His family history significant for brother who had stroke in his 70s.    INTERVAL HISTORY:  Visit 09/21/2020  No change from cardiovascular standpoint.  He denies of having any chest pain, shortness of breath, orthopnea paroxysmal nocturnal dyspnea.  Furthermore, he denies of having any lightheadedness, lower extremity edema or palpitations.  Limited in ambulation due to left-sided hemiparesis as a consequence of stroke.  Had echocardiogram this morning.  Final read pending.  Normal valve function.  Good blood pressure control at home.    PAST MEDICAL HISTORY:  Patient Active Problem List   Diagnosis   ? Abnormality of gait   ? Hx of medication noncompliance   ? Muscle weakness (generalized)   ? 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 Aortic stenosis   ? History of aortic valve replacement   ? Hypertension   ? Gout   ? Seborrheic dermatitis   ? Urinary frequency   ? CVA (cerebral vascular accident) (HCC/RAF)   ? H/O aortic valve replacement   ? Bicuspid aortic valve       PAST SURGICAL HISTORY:  Past Surgical History:   Procedure Laterality Date   ? AORTIC VALVE REPLACEMENT         ALLERGIES:  No Known Allergies    SOCIAL HISTORY:  Social History     Tobacco Use   ? Smoking status: Former Smoker     Packs/day: 1.00     Start date: 05/03/1975     Quit date: 05/02/1990     Years since quitting: 30.4   ? Smokeless tobacco: Never Used   Substance Use Topics   ? Alcohol use: Not Currently  Comment: prior heavy use when he was much younger   ? Drug use: Not Currently       FAMILY HISTORY:  Family History   Problem Relation Age of Onset   ? No Known Problems Mother    ? Other (gastric ulcer) Father    ? Uterine cancer Sister    ? Stroke Brother    ? Kidney failure Brother    ? Colon cancer Neg Hx    ? Lung cancer Neg Hx    ? Prostate cancer Neg Hx        REVIEW OF SYSTEMS:  All 14 systems were reviewed and were negative except as mentioned in the HPI    PHYSICAL EXAMINATION:  VITALS: BP 128/87  ~ Pulse 77  ~ Temp (!) 35.9 ?C (96.6 ?F) (Forehead)  ~ Resp 14  ~ Ht 5' 5'' (1.651 m)  ~ Wt 192 lb 11.2 oz (87.4 kg)  ~ SpO2 95% Comment: RA ~ BMI 32.07 kg/m?    General: Alert and oriented, not in acute distress.  HEENT: Normocephalic, atraumatic. Sclerae are anicteric.   Neck: Supple, no lymphadenopathy, JVP flat  Respiratory: Clear to auscultation bilaterally, no wheezing or rales.   Cardiac: RRR, S1/S2 normal, no murmurs, rubs or gallops. Peripheral pulses present.  Abdomen: Soft, non-tender, non-distended, normoactive bowel sounds.  Extremities: No cyanosis, clubbing or edema.    Integument: Warm, no petechiae. No acute rash.  Neurologic/Psychiatric: Alert and oriented. Affect is appropriate.       OUTPATIENT MEDICATIONS:  Current Outpatient Medications   Medication Sig   ? acetaminophen (TYLENOL) 325 mg tablet Take 650 mg by mouth every six (6) hours as needed. SKD TEST   ? acetaminophen (TYLENOL) 500 mg tablet Take 500 mg by mouth every six (6) hours as needed.   ? allopurinol 100 mg tablet Take 2 tablets (200 mg total) by mouth daily.   ? amLODIPine 5 mg tablet Take 1 tablet (5 mg total) by mouth daily.   ? aspirin 81 mg EC tablet Take 81 mg by mouth daily.   ? atorvastatin 20 mg tablet Take 1 tablet (20 mg total) by mouth at bedtime.   ? carvedilol 6.25 mg tablet Take 1 tablet (6.25 mg total) by mouth two (2) times daily.   ? FOLIC ACID PO Take 1,000 mcg by mouth daily.   ? ipratropium 0.03% nasal spray Spray 2 sprays in each nare every twelve (12) hours.   ? losartan 25 mg tablet Take 1 tablet (25 mg total) by mouth two (2) times daily.   ? Multiple Vitamins-Minerals (HCA SUPER THERAVITE-M PO) Take by mouth daily.     No current facility-administered medications for this visit.       LABORATORY:  Lab Results   Component Value Date    WBC 5.48 07/23/2020    HGB 15.9 07/23/2020    HCT 47.0 07/23/2020    MCV 86.9 07/23/2020    PLT 203 07/23/2020      Lab Results   Component Value Date    CREAT 0.67 07/23/2020    BUN 14 07/23/2020    NA 139 07/23/2020    K 3.8 07/23/2020    CL 102 07/23/2020    CO2 24 07/23/2020      Lab Results   Component Value Date    HGBA1C 6.1 (H) 07/23/2020      Lab Results Component Value Date    ALT 28 07/23/2020    AST 22 07/23/2020  ALKPHOS 52 07/23/2020    BILITOT 0.7 07/23/2020    No results found for: TSH   Lab Results   Component Value Date    CALCIUM 9.2 07/23/2020      Lab Results   Component Value Date    CHOL 122 07/23/2020    CHOLHDL 38 (L) 07/23/2020    CHOLDLCAL 71 07/23/2020    CHOLDLQ 61 03/11/2012    TRIGLY 67 07/23/2020           DIAGNOSTIC STUDIES:  ECHO 04/20/2019:  1. Left ventricle is normal in size with mild hypertrophy. Left ventricular   function is preserved and visually estimated to be 60-65%. There are no wall   motion abnormalities.   2. Normal biatrial?size.   3. Normal right ventricular size and systolic function.   4. Bioprosthetic valve (25-mm Carpentier-Edwards Magna Ease ; 1/13; Dr. Charyl Dancer)   is well seated in the aortic position. Peak velocity of 2.26 m/sec, mean   gradient of 11 mmHg, DI of 0.46, effective orifice area of 1.44 cm^2 (based   on LVOT diameter of 2cm)--consistent with normal prosthetic function.   5. Lack of an adequate TR signal precludes accurate estimation of right   ventricular systolic pressure. IVC diameter is = 2.1 cm with a > 50%   inspiratory collapse, suggestive of a right atrial pressure of 0-5 mmHg.     ECG 06/07/2020:  Normal sinus rhythm at ventricular rate of 74 beats per minute.  Mild ST elevation in inferior leads.  Described before.     []  Reviewed/ordered radiology  []  Independently interpreted studies    [x]  Reviewed/ordered labs  [x]  Reviewed & summarized old records    [x]  Reviewed/ordered diag med test   []  Decided to get outside medical records      []  Obtained history from someone other than patient    ASSESSMENT/PLAN:  1. Aortic valve replacement in 2013  Normal valve function on most recent echocardiogram in January 2021  Had echocardiogram this morning, final read pending.  I reviewed the images.  Normal valve function.  We discussed that some degeneration of the bioprosthetic valve can be expected 10 years after surgery so he will need close follow-up with serial echocardiograms  Continue aspirin    2. Bicuspid aortic valve  We discussed with there is an increased risk of having aortic aneurysm in patients with bicuspid aortic valve  Order CT angiogram to evaluate for presence of aortic aneurysm.  First-degree family members should be screened with echocardiograms    3. History of CVA  Residual leg weakness    4. Hyperlipidemia  On atorvastatin    5. Abnormal EKG  Patient has ST elevations in the inferior leads which have been present before.  I reviewed the records.  No further testing is necessary in this regard.  He had 2 negative coronary angiograms    Please note, I spent a total of 30 minutes for this visit including chart review, test interpretation, history taking, physical examination, counseling, coordination of care and documentation.    Plan of care was discussed with the patient and family in detail. All questions answered.    Charlton Amor, MD  Advanced Heart Failure and Transplantation Cardiology  Pager [phone]

## 2020-09-22 DIAGNOSIS — Z952 Presence of prosthetic heart valve: Secondary | ICD-10-CM

## 2020-09-22 DIAGNOSIS — I639 Cerebral infarction, unspecified: Secondary | ICD-10-CM

## 2020-09-22 DIAGNOSIS — I350 Nonrheumatic aortic (valve) stenosis: Secondary | ICD-10-CM

## 2020-09-22 DIAGNOSIS — I10 Essential (primary) hypertension: Secondary | ICD-10-CM

## 2020-09-29 MED ORDER — IPRATROPIUM BROMIDE 0.03 % NA SOLN
2 | Freq: Two times a day (BID) | NASAL
Start: 2020-09-29 — End: ?

## 2020-10-10 ENCOUNTER — Ambulatory Visit: Payer: PRIVATE HEALTH INSURANCE

## 2020-10-10 DIAGNOSIS — I639 Cerebral infarction, unspecified: Secondary | ICD-10-CM

## 2020-10-17 ENCOUNTER — Telehealth: Payer: PRIVATE HEALTH INSURANCE

## 2020-10-17 ENCOUNTER — Ambulatory Visit: Payer: PRIVATE HEALTH INSURANCE | Attending: Student in an Organized Health Care Education/Training Program

## 2020-10-17 ENCOUNTER — Non-Acute Institutional Stay: Payer: PRIVATE HEALTH INSURANCE | Attending: Cardiovascular Disease

## 2020-10-17 DIAGNOSIS — I639 Cerebral infarction, unspecified: Secondary | ICD-10-CM

## 2020-10-17 NOTE — Telephone Encounter
Pt is scheduled for his CT scan on 10/24/2020.     Radiology requesting creatinine lab order prior to his scan.

## 2020-10-17 NOTE — Telephone Encounter
Good afternoon Dr. Burnard Bunting,       Radiology requesting recent labs creatinene

## 2020-10-17 NOTE — Treatment Summary
PHYSICAL THERAPY TREATMENT NOTE    PATIENT: Andrew [name]  GENDER: male  AGE: 65 y.o.  DOB: Sep 11, 1955  MRN: [id number]    Diagnosis:     ICD-10-CM    1. Cerebrovascular accident (CVA), unspecified mechanism (HCC/RAF)  I63.9      HMO:    Visit Number: 2 of 16  Plan of Care Expires: 01/08/2021  Authorization Expires: 02/07/2021    Precautions: left hemiparesis     Pain:No    SUBJECTIVE: Standing 5 minutes for 3x per day. Walked 5-10 minutes per day with walker.     OBJECTIVE:   - seated on mat table, with 4 inch step below leg:   1. Trunk rotation exercise to the right, okay to use right arm support to increase range of motion and stability   2. Trunk rotation to the left, using right arm for reach with good stability    3. Trunk rotation to the left, using left arm for far stretched item reach    4. Shoulder flexion to end range, stretch 6 x 10 seconds    5. Seated forward bending, target on elevated surface and then progressed to floor    6. Seated marching, heavy cuing to maintain neutral posture and avoid trunk lean  - standing marching with walker : cuing to perform short range hip flexion  - stand pivot transfer training with walker x 2 bouts each direction    Patient Education:   Education Content: home exercise program    Education Delivery Method: verbal and demonstration   Education Response: verbalizes understanding    ASSESSMENT: majority of session focused on core strengthening and increasing confidence at trunk. He is over reliant on right upper extremity and trunk for stability with seated marching and good improvement noted with training. Similarly, he has difficulty with trunk rotation without use of arm for stability and improved confidence with training. While performing forward bending, he reports his abdominal girth is limiting his movement as well as his lack of endurance. In marching, he demonstrate ability to have adequate left lower extremity strength with right hip flexion and use of walking. However, when performing step pivot transfers, he has poor weight shifting and drags his right foot. Improvement in cuing and sequencing noted with training and encouraged to continue performing at home.    PLAN: review step pivot transfer, in parallel bars standing forward stepping     PTA Communication: All portions of the treatment plan may be delegated to the PTA with the exception of spinal joint mobilizations and grade IV and above peripheral joint mobilizations.     Patient was seen for a total of 45 minutes of treatment time.  45 minutes was in services represented by timed CPT codes.    Ewen Varnell HALANI Apryll Hinkle, PT

## 2020-10-18 ENCOUNTER — Non-Acute Institutional Stay: Payer: PRIVATE HEALTH INSURANCE

## 2020-10-18 NOTE — Telephone Encounter
Call Back Request      Reason for call back: Per wife patient needs to have creatinine lab entered before test below and also would like to get information about it.      Any Symptoms:  []  Yes  [x]  No       If yes, what symptoms are you experiencing:    o Duration of symptoms (how long):    o Have you taken medication for symptoms (OTC or Rx):      Patient or caller has been notified of the 24-48 hour turnaround time.

## 2020-10-18 NOTE — Telephone Encounter
Informed pt's wife via telephone regarding lab order.

## 2020-10-18 NOTE — Telephone Encounter
Reply by: Michael Litter Rockne Dearinger  Order has been placed.   Thank you

## 2020-10-19 ENCOUNTER — Telehealth: Payer: Commercial Managed Care - HMO

## 2020-10-19 NOTE — Telephone Encounter
Message to Practice/Provider      Message: [name] spouse called informing PT Dr. Ramiro Harvest had sent over a request for a Prosthetic order regarding patient's left leg. Spouse is requesting Dr. Hampton Abbot to approve & place order.    CB # [id number]    Return call is not being requested by the patient or caller.    Patient or caller has been notified of the 24-48 hour processing turnaround time if applicable.

## 2020-10-20 ENCOUNTER — Inpatient Hospital Stay: Payer: Commercial Managed Care - HMO | Attending: Cardiovascular Disease

## 2020-10-20 DIAGNOSIS — Q231 Congenital insufficiency of aortic valve: Secondary | ICD-10-CM

## 2020-10-21 ENCOUNTER — Ambulatory Visit: Payer: PRIVATE HEALTH INSURANCE

## 2020-10-21 ENCOUNTER — Telehealth: Payer: Commercial Managed Care - HMO

## 2020-10-21 MED ADMIN — IOHEXOL 350 MG/ML IV SOLN: 100 mL | INTRAVENOUS | Stop: 2020-10-21 | NDC 00407141491

## 2020-10-21 NOTE — Telephone Encounter
Call Back Request      Reason for call back:  [name] spouse [name] requesting a call back regarding previous encounter please. Per caller, the patient's current prosthetic has not been updated since 2013 and has been advised to follow up on order urgently.    Any Symptoms:  []  Yes  [x]  No       If yes, what symptoms are you experiencing:    o Duration of symptoms (how long):    o Have you taken medication for symptoms (OTC or Rx):      Patient or caller has been notified of the 24-48 hour turnaround time.

## 2020-10-24 ENCOUNTER — Ambulatory Visit: Payer: PRIVATE HEALTH INSURANCE | Attending: Student in an Organized Health Care Education/Training Program

## 2020-10-24 DIAGNOSIS — I639 Cerebral infarction, unspecified: Secondary | ICD-10-CM

## 2020-10-24 NOTE — Treatment Summary
PHYSICAL THERAPY TREATMENT NOTE    PATIENT: Andrew [name]  GENDER: male  AGE: 65 y.o.  DOB: 1955-05-12  MRN: [id number]    Diagnosis:     ICD-10-CM    1. Cerebrovascular accident (CVA), unspecified mechanism (HCC/RAF)  I63.9      HMO:    Visit Number: 3 of 16  Plan of Care Expires: 01/08/2021  Authorization Expires: 02/07/2021    Precautions: left hemiparesis     Pain:No    SUBJECTIVE: patient continues to having good report with home exercise program of standing, is performing the dishwashing and is walking within the house.      OBJECTIVE:   - In parallel bars:    1. Step training x 20 minutes    Right forward: cuing to increase left lower extremity weight bearing and less reliance on upper extremity     Left forward: cuing to avoid posterior trunk lean   2. Gait training x 10 minutes    - heavy cuing on proper weight bearing and weight shifting   - standing against mat table, weight shifting towards the left with right upper extremity -- incorporate with activities at home       Patient Education:   Education Content: home exercise program    Education Delivery Method: verbal and demonstration   Education Response: verbalizes understanding    ASSESSMENT: patient is noted to having poor left lower extremity weight bearing in standing and walking positions. He is encouraged to increase trunk rotation the left when he is washing the dishes or making dinner to increase weight bearing. While gait training, he required heavy verbal cuing on proper weight shifting and foot mechanics with fair to poor carryover. He has difficulty maintaining neutral posture due to weakness and lack of practice. Wife [name]) present throughout session and has good understanding on proper form desired for home.    PLAN: review step pivot transfer, in parallel bars standing forward stepping     PTA Communication: All portions of the treatment plan may be delegated to the PTA with the exception of spinal joint mobilizations and grade IV and above peripheral joint mobilizations.     Patient was seen for a total of 40 minutes of treatment time.  40 minutes was in services represented by timed CPT codes.    Kynslee Baham HALANI Jasleen Riepe, PT

## 2020-10-26 ENCOUNTER — Non-Acute Institutional Stay: Payer: PRIVATE HEALTH INSURANCE

## 2020-10-26 DIAGNOSIS — I639 Cerebral infarction, unspecified: Secondary | ICD-10-CM

## 2020-10-26 DIAGNOSIS — G8194 Hemiplegia, unspecified affecting left nondominant side: Secondary | ICD-10-CM

## 2020-10-26 NOTE — Telephone Encounter
:   1 week ago  Arac, Clearance Coots, MD  Thurston Hole, LVN  Hi Keeanna Villafranca,   Could you please take care of this? I approve this referral.   Thanks,   Ahmet            Previous Messages      ----- Message -----   From: Nathaneil Canary, PT   Sent: 10/10/2020  8:42 AM PDT   To: Verdell Carmine, MD   Subject: referral needs                    Hi Dr. Candyce Churn mentioned his current ankle foot orthosis is 65 years old and the original one received post stroke in 2013. Would you be able to place a referral for a new one?     Thanks,   [employer]

## 2020-10-26 NOTE — Telephone Encounter
Order placed today. Called and notified patient and spouse. I suggested that she call the rehab department to obtain more information about scheduling an appt for the AFO.

## 2020-10-31 ENCOUNTER — Ambulatory Visit: Payer: PRIVATE HEALTH INSURANCE

## 2020-10-31 DIAGNOSIS — I639 Cerebral infarction, unspecified: Secondary | ICD-10-CM

## 2020-10-31 NOTE — Treatment Summary
PHYSICAL THERAPY TREATMENT NOTE    PATIENT: Andrew [name]  GENDER: male  AGE: 65 y.o.  DOB: 06-02-1955  MRN: [id number]    Diagnosis:     ICD-10-CM    1. Cerebrovascular accident (CVA), unspecified mechanism (HCC/RAF)  I63.9      HMO:    Visit Number: 4 of 16  Plan of Care Expires: 01/08/2021  Authorization Expires: 02/07/2021    Precautions: left hemiparesis     Pain:No    SUBJECTIVE: patient went walking at the park over the weekend with improvement in endurance. Has his ankle foot orthosis evaluation this week.     OBJECTIVE:   - sit to stand: independent onto therapist hand  - stand step transfer to the right with therapy assist: with minimal to moderate assistance  - sit to supine: maximum assistance by wife to transfer left lower extremity  - supine straight leg raise on left: unable  - supine marching x 10 with heavy cuing to control eccentric movement -- home exercise program   - supine with legs grey therapy ball, supine knee flexion 2 x 20  - supine bridges (with no ankle foot orthosis), x 20 bouts -- home exercise program   - supine bridges with legs grey therapy ball, 2 x 20 bouts   - hooklying shoulder flexion with 4 pound ball, 2 x 20 bouts  - supine to sit with minimal assistance with lower extremity   - patient able to don left ankle foot orthosis independently  - stand pivot with  Movement towards right with moderate assistance     Patient Education:   Education Content: home exercise program    Education Delivery Method: verbal and demonstration   Education Response: verbalizes understanding    ASSESSMENT: continues to having good participation in therapy with treatment session focused on lower extremity strengthening program. He is educated on importance of performing bed exercises to improve tolerance for standing endurance and walking posture. Upon removing the ankle foot orthosis, he is noted to having dark bruising at the top of the brace as well as indentations in the skin. Informed to inform orthotist this week during evaluation. While performing lower extremity strengthening with therapy ball, noted to having fair core control with ball.    PLAN: standing exercises     PTA Communication: All portions of the treatment plan may be delegated to the PTA with the exception of spinal joint mobilizations and grade IV and above peripheral joint mobilizations.     Patient was seen for a total of 40 minutes of treatment time.  40 minutes was in services represented by timed CPT codes.    Aima Mcwhirt HALANI Corneluis Allston, PT

## 2020-11-02 ENCOUNTER — Telehealth: Payer: PRIVATE HEALTH INSURANCE

## 2020-11-02 NOTE — Telephone Encounter
PDL Call to Practice    Reason for Call:Pt's wife calling in regards to return visit on 08/11, showing reschedule needed  MD:Dr Nitti    Appointment Related?  [x]  Yes  []  No     If yes;  Date: 11/10/20  Time:    Call warm transferred to PDL: []  Yes  [x]  No    Call Received by Practice Representative: Fleet Contras (Error Dr Aletha Halim will be in office 08/11)

## 2020-11-04 ENCOUNTER — Ambulatory Visit: Payer: PRIVATE HEALTH INSURANCE

## 2020-11-04 DIAGNOSIS — I639 Cerebral infarction, unspecified: Secondary | ICD-10-CM

## 2020-11-04 DIAGNOSIS — G8194 Hemiplegia, unspecified affecting left nondominant side: Secondary | ICD-10-CM

## 2020-11-04 NOTE — Progress Notes
PATIENT: Andrew [name]  MRN: [id number]  DOB: March 31, 1956  DATE OF SERVICE: 11/04/2020      SUBJECTIVE       Andrew [name] was seen at [facility] for eval for a new left AFO.    Pain: No pain.  Fall History: 1 fall in past 12 months; no injuries from this fall.  Physical Therapy: OP PT with Kamini at [facility].  AFO History: 2013 left custom hinged AFO with PF stop; has excessive wear and tear and too tight/small leaving skin indentations on his leg; in need of replacement to control left foot drop.     ADL's: Patient is a retired Advice worker. Patient has ADL's in home and community.     OBJECTIVE       Patient accompanied by his wife, Andrew [name].      Patient arrived in manual wheelchair; uses a walker for household mobility and WC for community ambulation.      Current Height & Weight: 5 ft 5 in. 190 lbs.    ASSESSMENT       Andrew [name] is a pleasant 65 y.o. male with dx of left foot drop and LLE weakness 2/2 CVA in Jan 2013. LLE exhibits equinovarus positioning with pes cavus foot and hindfoot varus. Left ankle exhibits no dorsiflexion ROM; can be ranged to almost neutral position. MMT RLE is WNL. Patient has fair strength of left KE, KF, HF, and HE. Patient exhibits weakness of left dorsiflexors (1/5 MMT) and plantarflexors (3/5 MMT). Skin check reveals a dark discoloration on posterior calf; skin injury from the too-tight AFO.    Current left AFO is too tight upon inspection and 26-36 years old; in need of replacement for optimal fit and function. Current AFO has a broken PF stop allowing patient to excessively plantarflex to 10 deg during late stance with reduced left step length. Patient walks with walker + left hinged AFO, exhibiting short step lengths (left shorter than right) and reduced hip extension during stance.     Took fiberglass cast for LT custom AFO without incidence. Proper AFO height is 38 cm (2 cm shorter than current AFO). Patient prefers AFO calf padding to reduce risk of skin breakdown. GOALS / RECOMMENDATION        Orthosis Recommendation: left custom plastic AFO     - Features: varus-control mods, [place] straight joints, and Snapstop (PF stop).    - Ankle Angle: near neutral position to accommodate ankle tightness / limited ROM   - Footplate length: Full-length  - Interface: Pelite padding adjacent to posterior calf     Clinician-Directed Goals / Medical Necessity: Left custom hinged AFO is required to improve stability and alignment of ankle-foot-complex in multiple planes, controlling gait deviations: left foot drop and triplanar excessive supination. Patient has diagnosis of left hemiparesis and left foot drop secondary to CVA. AFO must have varus-control modifications for optimal AFO control of excessive supination.     Patient Goals: [name] functional goals include the following: control LT foot drop / over-supination, well-fitting AFO, improve mobility.    PLAN      Education: Educated patient and wife on AFO function / Estate agent, limitations, casting/measurement protocols, purchasing/fabrication, plan of care, & how to contact office as needed for inquiries. Barrier: None. Needs: Knows well  Teaching Method: Verbal instruction. Outcome: Able to repeat information and/or demonstrate what was taught.     Fabrication: In-house AFO fab    Next Visit: left custom AFO fitting/delivery at [facility] in 2-3  weeks   Loleta Chance, [facility]    Author:  Loleta Chance 11/04/2020 1:06 PM

## 2020-11-07 ENCOUNTER — Ambulatory Visit: Payer: PRIVATE HEALTH INSURANCE

## 2020-11-07 ENCOUNTER — Non-Acute Institutional Stay: Payer: Commercial Managed Care - HMO

## 2020-11-07 DIAGNOSIS — I639 Cerebral infarction, unspecified: Secondary | ICD-10-CM

## 2020-11-07 DIAGNOSIS — G8194 Hemiplegia, unspecified affecting left nondominant side: Secondary | ICD-10-CM

## 2020-11-07 NOTE — Progress Notes
PATIENT:  Andrew [name]  MRN:  [id number]  DOB:  Dec 30, 1955  DATE OF SERVICE:  11/10/2020    REFERRING PRACTITIONER: Sissy Hoff., MD  PRIMARY CARE PROVIDER: Thayer Headings., MD    REASON FOR REFERRAL: urinary frequency     CHIEF COMPLAINT: same    Subjective:     History of Present Illness:  Andrew [name] is a 65 y.o. 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 Denies any sudden urges or incontinence, leakage in between episodes of urge. These symptoms were previously attributed to BPH/OAB; he had been previously seen by an outside urologist, Dr. Lindie Spruce in June 2016. Previously on trospium 60mg  po qday.    Today, his wife confirms the history as above- his most bothersome complaint is urinary frequency. At night, he gets up to urinate at least 2x/nightly, previously 5x. In the daytime, he also urinates about 5x. This does not particularly disturb his sleep. Force of stream is good. He is no longer taking trospium, discontinued on advice from a previous urologist. Currently, he does not have any issues with urinary incontinence, although he sometimes has issues holding in urine as it is slower for him to get to the bathroom.    He is able to ambulate with a walker. Currently in a wheelchairdue to previous CVA in 2013 with residual left-sided weakness.  He can however walk with a walker    He is relatively active and rides a stationary bike for exercise, 5 days per week, 20 minutes daily.    PSA History:  02/28/19 - 0.4   04/28/20 - 0.4    Last visit 05/12/20:   He has an unremarkable physical exam with respect to GU tract.  His most bothersome complaint is urinary frequency. With normal UF and PVR of 65cc today, he is emptying well, more likely related to OAB than enlargement of the prostate.     I explained to the patient and his wife that is current number of voids per day, about 5 per day and 2 at night, is within the range of normal for his age.  He does not appear to have obstructive voiding based on his uroflow.  If he were bothered by urgency we could consider treating him for overactive bladder and therefore.  I offered to start him on an OAB medication to help with his lower urinary tract symptoms, however the patient declines and prefers to maintain a course of watchful waiting which is certainly reasonable.    He will contact me if he has any new concerns, and otherwise return in 6 months for recheck.    Interval History 11/10/2020:  The patient reports improvement in symptoms. He only gets up once a night now. Not bothered by an LUTS.      MEDS  Outpatient Medications Prior to Visit   Medication Sig   ? acetaminophen (TYLENOL) 325 mg tablet Take 650 mg by mouth every six (6) hours as needed. SKD TEST   ? acetaminophen (TYLENOL) 500 mg tablet Take 500 mg by mouth every six (6) hours as needed.   ? allopurinol 100 mg tablet Take 2 tablets (200 mg total) by mouth daily.   ? amLODIPine 5 mg tablet Take 1 tablet (5 mg total) by  mouth daily.   ? aspirin 81 mg EC tablet Take 81 mg by mouth daily.   ? atorvastatin 20 mg tablet Take 1 tablet (20 mg total) by mouth at bedtime.   ? carvedilol 6.25 mg tablet Take 1 tablet (6.25 mg total) by mouth two (2) times daily.   ? FOLIC ACID PO Take 1,000 mcg by mouth daily.   ? ipratropium 0.03% nasal spray Spray 2 sprays in each nare every twelve (12) hours.   ? losartan 25 mg tablet Take 1 tablet (25 mg total) by mouth two (2) times daily.   ? Multiple Vitamins-Minerals (HCA SUPER THERAVITE-M PO) Take by mouth daily.     No facility-administered medications prior to visit.       PMH:  Past Medical History:   Diagnosis Date   ? CVA (cerebral vascular accident) (HCC/RAF)    ? H/O aortic valve replacement    ? Kidney stone    ? Urinary frequency         PSH:  Past Surgical History:   Procedure Laterality Date   ? AORTIC VALVE REPLACEMENT          Allergies:  No Known Allergies    FAM HX:   Family History   Problem Relation Age of Onset   ? No Known Problems Mother    ? Other (gastric ulcer) Father    ? Uterine cancer Sister    ? Stroke Brother    ? Kidney failure Brother    ? Colon cancer Neg Hx    ? Lung cancer Neg Hx    ? Prostate cancer Neg Hx        Social HX:  Social History     Socioeconomic History   ? Marital status: Married   Tobacco Use   ? Smoking status: Former Smoker     Packs/day: 1.00     Start date: 05/03/1975     Quit date: 05/02/1990     Years since quitting: 30.5   ? Smokeless tobacco: Never Used   Substance and Sexual Activity   ? Alcohol use: Not Currently     Comment: prior heavy use when he was much younger   ? Drug use: Not Currently       Review of Systems:  Review of Systems: A 14 point review of systems was negative.  Pertinent items are noted in HPI.    Objective:     There were no vitals filed for this visit.    Physical Exam:  Constitutional: Patient is oriented. Appears well-developed and well-nourished.  HENT:   Head: Normocephalic and atraumatic.   Neck: Normal range of motion. Neck supple.  Pulmonary/Chest: Effort normal  Abdominal: Soft., Nontender, no masses   Musculoskeletal: Normal range of motion.   Neurological:  alert and oriented.   Skin: Skin is warm and dry.   Psychiatric: normal mood and affect and behavior. Judgment and thought content normal.  Results for orders placed or performed in visit on 11/10/20   POCT urinalysis dipstick   Result Value Ref Range    Glucose Negative Negative    Bilirubin Negative Negative    Ketone Negative Negative    Specific Gravity 1.025 <1.005, 1.005, 1.010, 1.015, 1.020, 1.025, 1.030    Blood Trace (A) Negative     Comment: intact    pH 6.0 <5.0, 5.0, 5.5, 6.0, 6.5, 7.0, 7.5, 8.0, 8.5    Protein Negative Negative Urobilinogen 0.2 mg/dL 0.2 mg/dL, 1.0 mg/dL    Nitrite  Negative Negative    Leukocyte Negative Negative   URO IN CLINIC PVR   Result Value Ref Range    Urine Volume 15 ml     Results for orders placed or performed in visit on 11/10/20   URO IN CLINIC PVR   Result Value Ref Range    Urine Volume 15 ml            Assessment:   Andrew [name] is a 65 y.o. 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      Had mild LUTS 6 months ago, which actually improved. No need for treament.  UA with trace blood today     Plan/ Recommendation:   Formal UA  RTC in one year if UA negative  PSA next visit (0.4 03/2019)    I spent a total of 20 minutes today for this  visit which includes some or all of the following components: face-to-face time, non-face-to-face time, preparation to see the patient, review of tests, obtaining and or reviewing separately obtained history performing a medically  appropriate  examination  and/or evaluation, counseling and educating the  patient/family/caregiver,  ordering medications, tests, or procedures, referring and communicating with other healthcare professionals, documenting clinical information in the EHR and independently interpreting results and communicating results to patient.    Sissy Hoff, MD  Professor of Urology and Obstetrics and Gynecology  [employer] [facility] SIGNATURE: I, Jacquiline Doe, have assisted Dr. Sissy Hoff, MD with the documentation for Andrew [name] on 11/07/2020

## 2020-11-07 NOTE — Treatment Summary
PHYSICAL THERAPY TREATMENT NOTE    PATIENT: Andrew [name]  GENDER: male  AGE: 65 y.o.  DOB: [birth year]  MRN: [id number]    Diagnosis:     ICD-10-CM    1. Cerebrovascular accident (CVA), unspecified mechanism (HCC/RAF)  I63.9      HMO:    Visit Number: 5 of 16  Plan of Care Expires: 01/08/2021  Authorization Expires: 02/07/2021    Precautions: left hemiparesis     Pain:No    SUBJECTIVE: Patient practiced sit to stand and walking at the park. He was casted for a new ankle foot orthosis last Friday and is scheduled to get in couple weeks.    OBJECTIVE:   Wheelchair: poor fitting (patient and wife made aware previously), poor brakes and appear to being a safety concern    - sit to stand: independent onto walker (heavy cuing on sequencing)  - standing with railing in front: lateral weight shifting x 4 minutes // heavy cuing to increase left lower extremity weight bearing   - 10 meter walk test:   1st bout: 36 seconds   2nd bout: 33 seconds   3rd bout: 31 seconds   - left hand shoulder spider crawls against wall and cue for hips to the wall // good left lower extremity weight acceptance x 5 bouts    Patient Education:   Education Content: home exercise program    Education Delivery Method: verbal and demonstration   Education Response: verbalizes understanding    ASSESSMENT: patient's left ankle foot orthosis is making audible sounds throughout session, he reports to brace being tight and okay to continue with session. This is noted impacting his quality of ambulation and weight bearing but patient stating it is not hindering him. While performing weight shifting, patient is noted to having difficulty with weight shifting and weight acceptance on the left leg and is noted to performing right trunk bend to push hips to the left. Wife present and reports to being able to guide him at home with this. Good weight shifting noted with 1st bout of ten meter walk test but poor endurance/fatigue noted with other bouts despite having a sitting rest break during each session. The wheelchair is poor fitting, too small for him, and the brakes are not stable and requires a 2nd person present for safety. Also, he does not always reach back to identify the chair's position and is educated on importance of holding the chair with one hand, especially due to poor brakes.    PLAN: standing strengthening exercises , practice turns    PTA Communication: All portions of the treatment plan may be delegated to the PTA with the exception of spinal joint mobilizations and grade IV and above peripheral joint mobilizations.     Patient was seen for a total of 40 minutes of treatment time.  40 minutes was in services represented by timed CPT codes.    Isela Stantz HALANI Valerye Kobus, PT

## 2020-11-10 ENCOUNTER — Non-Acute Institutional Stay: Payer: PRIVATE HEALTH INSURANCE

## 2020-11-10 ENCOUNTER — Ambulatory Visit: Payer: PRIVATE HEALTH INSURANCE

## 2020-11-10 DIAGNOSIS — R350 Frequency of micturition: Secondary | ICD-10-CM

## 2020-11-10 LAB — UA,Microscopic: RBCS: 6 {cells}/uL (ref 0–11)

## 2020-11-14 ENCOUNTER — Non-Acute Institutional Stay: Payer: PRIVATE HEALTH INSURANCE

## 2020-11-15 NOTE — Treatment Summary
PHYSICAL THERAPY TREATMENT NOTE    PATIENT: Andrew [name]  GENDER: male  AGE: 65 y.o.  DOB: 1955/05/06  MRN: [id number]    Diagnosis:     ICD-10-CM    1. Cerebrovascular accident (CVA), unspecified mechanism (HCC/RAF)  I63.9         HMO:    Visit Number: 6 of 16  Plan of Care Expires: 01/08/2021  Authorization Expires: 02/07/2021    Precautions: left hemiparesis     Pain:No    SUBJECTIVE:  Received in wheel chair with wife, [name], present. Denies any recent falls nor changes in medications. Patient reports he is still waiting for his new ankle foot orthosis. Doing exercises daily. Patient reports his balance is still off. According to wife patient walks slow but can go farther than he used to be.     OBJECTIVE:  **gait belt used during session. Next session start with gait training.    sit to stand from wheel chair **poor eccentric control noted due to lack of hip flexion strategy     - seated hip flexion strategy with hands on arm rest 10 x 10 second hold **new home exercise program. Moderate to maximum cues and facilitation for proper technique.   - sit to stand from wheel chair with hand support 2 x 5 **maximum cues and facilitation for proper upper extremity use and support and or proper hip flexion strategy. Patient fatigues quickly. Required 2-3 minute sitting break in between sets. Also cued to keep his weight forward to avoid his toes coming up.  - gait training using front wheel walker with minim,al to moderate assist with wife following with wheel chair x 25 feet **moderate to maximum cues and facilitation for proper adaptive equipment management and to keep trunk upright. In structed wife to have a 2nd person assisting patient during gait while another person follows with his wheel chair for safety.         Patient Education:   Education Content: home exercise program    Education Delivery Method: verbal and demonstration   Education Response: verbalizes understanding    ASSESSMENT:  Patient presents with movement, transfer and gait dysfunction. Required moderate to maximum cues and facilitation for proper hip flexion strategy during transfers. Patient with poor endurance noted during transfers. Required fair amount of sitting breaks after each short set of transfer training. Required moderate to maximum cues and facilitation during gait training.       PLAN: standing strengthening exercises , practice turns    PTA Communication: All portions of the treatment plan may be delegated to the PTA with the exception of spinal joint mobilizations and grade IV and above peripheral joint mobilizations.     Patient was seen for a total of 45 minutes of treatment time.  45 minutes was in services represented by timed CPT codes.    Dacoda [name] Olive Bass, PTA

## 2020-11-16 ENCOUNTER — Ambulatory Visit: Payer: Commercial Managed Care - HMO

## 2020-11-16 DIAGNOSIS — I639 Cerebral infarction, unspecified: Secondary | ICD-10-CM

## 2020-11-23 ENCOUNTER — Ambulatory Visit: Payer: PRIVATE HEALTH INSURANCE

## 2020-11-23 ENCOUNTER — Non-Acute Institutional Stay: Payer: PRIVATE HEALTH INSURANCE

## 2020-11-23 DIAGNOSIS — I639 Cerebral infarction, unspecified: Secondary | ICD-10-CM

## 2020-11-23 NOTE — Treatment Summary
PHYSICAL THERAPY TREATMENT NOTE    PATIENT: Andrew [name]  GENDER: male  AGE: 65 y.o.  DOB: [birth year]  MRN: [id number]    Diagnosis:     ICD-10-CM    1. Cerebrovascular accident (CVA), unspecified mechanism (HCC/RAF)  I63.9         HMO:    Visit Number: 7 of 16  Plan of Care Expires: 01/08/2021  Authorization Expires: 02/07/2021    Precautions: left hemiparesis     Pain:No    SUBJECTIVE: He has not received his ankle foot orthosis yet, wife will follow up. He would like to focus on gait training at this.     OBJECTIVE:    - gait training: x 35 minutes   1. Cuing to increase step length bilaterally   2. Cuing to increase left hip flexion and decrease circumduction pattern   3. Cuing to increase left lower extremity weight bearing   - standing weight shifting and weight bearing with use of metronome on 50 beats      Patient Education:   Education Content: home exercise program    Education Delivery Method: verbal and demonstration   Education Response: verbalizes understanding    ASSESSMENT:  Treatment session focused on gait training and improvement noted towards end of session, limited by fatigue and tight ankle foot orthosis. He has decreased left lower extremity weight bearing that has impacted his ambulation pattern as well as sit to stand. Improvement in sit to stand noted compared to previous session. While performing weight shifting and weight bearing, patient has difficulty performing at a rhythmic movement pattern and reports good understanding of performing at home.    PLAN: reassess gait pattern    PTA Communication: All portions of the treatment plan may be delegated to the PTA with the exception of spinal joint mobilizations and grade IV and above peripheral joint mobilizations.     Patient was seen for a total of 40 minutes of treatment time.  40 minutes was in services represented by timed CPT codes.    Cotina Freedman HALANI Melbourne Jakubiak, PT

## 2020-11-24 ENCOUNTER — Ambulatory Visit: Payer: PRIVATE HEALTH INSURANCE

## 2020-11-24 DIAGNOSIS — G8194 Hemiplegia, unspecified affecting left nondominant side: Secondary | ICD-10-CM

## 2020-11-25 NOTE — Treatment Summary
PHYSICAL THERAPY TREATMENT NOTE    PATIENT: Andrew [name]  GENDER: male  AGE: 65 y.o.  DOB: Jun 20, 1955  MRN: [id number]    Diagnosis:     ICD-10-CM    1. Cerebrovascular accident (CVA), unspecified mechanism (HCC/RAF)  I63.9         HMO:    Visit Number: 8 of 16  Plan of Care Expires: 01/08/2021  Authorization Expires: 02/07/2021    Precautions: left hemiparesis     Pain:No    SUBJECTIVE:  Received in wheel chair with wife, [name], present. Denies any recent falls nor changes in medications. Patient reports gait improving. Wearing old ankle foot orthosis. Still waiting for new ankle foot orthosis. Wife reports they will pick it up this Friday. Patient brought his own gait belt.     OBJECTIVE:  **used gait bel during session.   - gait training using front wheel walker with minimal assist with wife following with wheel chair x 100 feet   ** takes long steps on left lower extremity and short steps on right lower extremity. Noted mild shuffling tendency on left lower extremity.     - sit to stand with minimal to contact guard assist with moderate cues and facilitation for upper extremity use and hip flexion strategy     Parallel bars: **instructed to do this at home next to kitchen counter   - pre-gait: left lower extremity stationary working on heel-toe rocker and increased stance time on left lower extremity: right leg forward and back **moderate to maximum cues and facilitation for pero technique. Required minimal assist.   - pre-gait: right lower extremity stationary working on heel-toe rocker: left lower extremity forward and back working on increased hip flexion and foot clearance to avoid shuffling **moderate to maximum cues and facilitation for pero technique. Required minimal assist.     - gait training using front wheel walker with minimal assist with wife following with wheel chair x 100 feet with maximum cues and facilitation for left hip flexion and left foot clearance and for step through pattern with right lower extremity.       Patient Education:   Education Content: gait training    Education Delivery Method: verbal and demonstration   Education Response: verbalizes understanding    ASSESSMENT:  Patient still presents with gait dysfunction. Patient required minimal assist during gait on parallel bars and with front wheel walker. Required maximum cues and facilitation to correct gait compensations.       PLAN: reassess gait pattern    PTA Communication: All portions of the treatment plan may be delegated to the PTA with the exception of spinal joint mobilizations and grade IV and above peripheral joint mobilizations.     Patient was seen for a total of 40 minutes of treatment time.  40 minutes was in services represented by timed CPT codes.    Deandra Gadson Olive Bass, PTA

## 2020-11-29 ENCOUNTER — Ambulatory Visit: Payer: PRIVATE HEALTH INSURANCE

## 2020-11-29 DIAGNOSIS — I639 Cerebral infarction, unspecified: Secondary | ICD-10-CM

## 2020-11-30 ENCOUNTER — Non-Acute Institutional Stay: Payer: Commercial Managed Care - HMO

## 2020-12-02 ENCOUNTER — Ambulatory Visit: Payer: PRIVATE HEALTH INSURANCE

## 2020-12-02 DIAGNOSIS — G8194 Hemiplegia, unspecified affecting left nondominant side: Secondary | ICD-10-CM

## 2020-12-02 DIAGNOSIS — I639 Cerebral infarction, unspecified: Secondary | ICD-10-CM

## 2020-12-02 NOTE — Progress Notes
PATIENT: Andrew [name]  MRN: [id number]  DOB: 1955/07/07  DATE OF SERVICE: 12/02/2020      SUBJECTIVE       Andrew [name] was seen at [facility] for fitting/delivery for a left custom AAFO (articulated ankle foot orthosis with PF stop).      Pain: No Pain    OBJECTIVE      Patient accompanied by his wife, [name]. Patient arrived in [place]; ambulates with LT AFO + FWW.    ASSESSMENT     New left AAFO was adjusted today:  - Switched chafe to lateral side to ease strap donning / doffing.   - Heat-flared lateral malleolus to avoid excessive pressure.  - Installed Bocklite pad to increase effectiveness of plastic lateral Sabolich (varus control).  -------------------------------------------------  Following AFO adjustments, patient walked with new left AAFO + FWW for 10 feet. New AFO fit/function deemed appropriate and beneficial to patient. Orthosis was examined for structural integrity and found to be sound. Skin check is unremarkable. Patient reported satisfaction with fit/function and denied pain with orthosis. Patient tolerated well with all procedures.    Fit/delivered a left custom articulated AFO w/ PF stop today.  OUTCOMES      When walking with FWW and new left custom AAFO, patient exhibits increased / improved LT step length as well as heel-to-toe gait with proper foot swing clearance and less LT over-supination during stance.    GOALS / RECOMMENDATION        Orthosis Recommendation: left custom plastic articulated AFO     - Features: varus-control mods, [place] straight joints, and Snapstop (PF stop).    - Ankle Angle: near neutral position to accommodate ankle tightness / limited ROM   - Footplate length: Full-length  - Interface: Pelite padding adjacent to posterior calf  ?   Clinician-Directed Goals / Medical Necessity: Left custom articulated AFO is required to improve stability and alignment of ankle-foot-complex in multiple planes, controlling gait deviations: left foot drop and triplanar excessive supination. Patient has diagnosis of left hemiparesis and left foot drop secondary to CVA. AFO must have varus-control modifications for optimal AFO control of excessive supination.   ?  Patient Goals: [name] functional goals include the following: control LT foot drop / over-supination, well-fitting AFO, improve mobility.    PLAN     Education: Educated patient on orthosis: function, limitations, donning/doffing, skin checks following use of orthosis, use and care, & how to contact office as needed for questions/concerns. Barrier: None. Needs: Knows well. Teaching Method: Demonstration, Corporate investment banker. Outcome: Able to repeat information and/or demonstrate what was taught.    Plan: Will follow-up as warranted. Please call at [phone].   Loleta Chance, [facility]  Author:  Loleta Chance 12/02/2020 3:07 PM

## 2020-12-14 ENCOUNTER — Ambulatory Visit: Payer: PRIVATE HEALTH INSURANCE

## 2020-12-14 DIAGNOSIS — I639 Cerebral infarction, unspecified: Secondary | ICD-10-CM

## 2020-12-14 NOTE — Treatment Summary
PHYSICAL THERAPY TREATMENT NOTE    PATIENT: Andrew [name]  GENDER: male  AGE: 65 y.o.  DOB: 1955-05-29  MRN: [id number]    Diagnosis:     ICD-10-CM    1. Cerebrovascular accident (CVA), unspecified mechanism (HCC/RAF)  I63.9         HMO:    Visit Number: 9 of 16  Plan of Care Expires: 01/08/2021  Authorization Expires: 02/07/2021    Precautions: left hemiparesis     Pain:No    SUBJECTIVE:  Received in wheel chair with wife, [name], present. He got a new ankle foot orthosis and reports its not as uncomfortable. He has not been able to practice walking at the park lately due to the extreme heat wave. [name] reports the neurologist office has provided them with contacts for a wheelchair and she has to follow up with the offices.     OBJECTIVE:   - in parallel bars:   1. Normal stance, eyes open: 30 seconds   2. Normal stance, eyes closed: up to 5-8 seconds prior to sway and therapist provided tactile feedback of appropriate boundaries    3. Narrow stance, eyes closed: unable to hold stability greater than 3 seconds   4. Forward support, 2 hands, unilateral hip flexion onto 10 inch step x 15 bouts each side when moderate to heavy verbal cuing on weight shifting and proper form   5. Lateral support, 1 hand, unilateral step up onto 6 inch step, x 15 bouts with improvement in weight shifting    6. Lateral support, 2 hands, forward stepping then diagonal stepping onto stepping stones x 10 minutes    - per request, additional information provided for local wheelchair vendors      Patient Education:   Education Content: gait training    Education Delivery Method: verbal and demonstration   Education Response: verbalizes understanding    ASSESSMENT:  Improvement noted towards the end of the session with weight shifting and increased trust and reliance on left lower extremity, did have 1 episode of buckling but patient able to self recover. He is pleased with knowing proper technique for diagonal stepping and states he will practice this at home as this will be easier for him to complete tasks at home.   Ample rest breaks provided throughout the session as needed    PLAN: reassess standing weight shifting     PTA Communication: All portions of the treatment plan may be delegated to the PTA with the exception of spinal joint mobilizations and grade IV and above peripheral joint mobilizations.     Patient was seen for a total of 40 minutes of treatment time.  40 minutes was in services represented by timed CPT codes.    Takari Duncombe HALANI Mykal Batiz, PT

## 2020-12-21 ENCOUNTER — Ambulatory Visit: Payer: PRIVATE HEALTH INSURANCE

## 2020-12-21 DIAGNOSIS — I639 Cerebral infarction, unspecified: Secondary | ICD-10-CM

## 2020-12-21 NOTE — Treatment Summary
PHYSICAL THERAPY TREATMENT NOTE    PATIENT: Andrew [name]  GENDER: male  AGE: 65 y.o.  DOB: 1955/08/19  MRN: [id number]    Diagnosis:     ICD-10-CM    1. Cerebrovascular accident (CVA), unspecified mechanism (HCC/RAF)  I63.9         HMO:    Visit Number: 10 of 16  Plan of Care Expires: 01/08/2021  Authorization Expires: 02/07/2021    Precautions: left hemiparesis     Pain:No    SUBJECTIVE:  Received in wheel chair with wife, [name], present. He was unable practice balance exercises at home but has continued practicing his gait pattern. Reports to being more tired today    OBJECTIVE:   - wheelchair to bed transfer: modified independence with walker  - supine hooklying hip adduction ball squeeze x 20  - supine hooklying hip adduction ball squeeze + reverse curl 3 x 10 with heavy cuing to sequence breathing pattern with exhalation on effort  - supine hooklying hip adduction ball squeeze+ trunk rotation with heavy cuing to increase range, 2 x 20 bouts  - supine hamstring curl with foot on ball, x20 bouts  - gait training with walker x 70 feet with minimal cuing to improve sequencing     ** home exercise program to perform exercises above     Patient Education:   Education Content: gait training    Education Delivery Method: verbal and demonstration   Education Response: verbalizes understanding    ASSESSMENT:  Good tolerance to treatment session with heavy cuing to maintain form and stability while performing bed exercises. They are educated on importance of increasing compliance to strengthening program at home as well as continuing the gait training and balance exercises.     PLAN: reassess standing weight shifting     PTA Communication: All portions of the treatment plan may be delegated to the PTA with the exception of spinal joint mobilizations and grade IV and above peripheral joint mobilizations.     Patient was seen for a total of 40 minutes of treatment time.  40 minutes was in services represented by timed CPT codes.    Silus Lanzo HALANI Hannelore Bova, PT

## 2020-12-28 ENCOUNTER — Ambulatory Visit: Payer: PRIVATE HEALTH INSURANCE

## 2020-12-28 DIAGNOSIS — I639 Cerebral infarction, unspecified: Secondary | ICD-10-CM

## 2020-12-28 NOTE — Treatment Summary
PHYSICAL THERAPY TREATMENT NOTE    PATIENT: Andrew [name]  GENDER: male  AGE: 65 y.o.  DOB: 05-21-1955  MRN: [id number]    Diagnosis:     ICD-10-CM    1. Cerebrovascular accident (CVA), unspecified mechanism (HCC/RAF)  I63.9         HMO:    Visit Number: 11 of 16  Plan of Care Expires: 01/08/2021  Authorization Expires: 02/07/2021    Precautions: left hemiparesis     Pain:No    SUBJECTIVE:  Received in wheel chair with wife, [name], present. He is requesting to focus on gait training but reports has not been able to perform much at home due to heat wave and usually walks in the local park. Reports to performing home exercise program.     OBJECTIVE:   - in parallel bars:    1. Lateral support, 1 hands, forward stepping then diagonal then right lateral stepping onto stepping stones x 25 minutes   - with emphasis on increasing left lower extremity weight bearing tolerance as well as left hip flexion  - double limb leg press at 70 pounds x 20 repetitions  - left single limb leg press at 50 pounds x 10 repetitions       Patient Education:   Education Content: gait training    Education Delivery Method: verbal and demonstration   Education Response: verbalizes understanding    ASSESSMENT:  Majority of treatment session focused in parallel bars with emphasis on weight shifting, increasing reliance on left lower extremity, increasing lower extremity strength as well as sequencing. He requires heavy verbal cuing but good efforts noted. He has decreased left hip flexion and often compensates with a thrust and right trunk lean. He is unable to clear the stone upon completion of the activity and drags his foot off. He states that he is trying to increase weight shifting activities at home. I recommended they look into hiring help to increase activity level and exercises as his wife is working. He notes to having good fatigue at the end of the session and enjoyed use of leg press, continues to having difficulty with avoiding thrusting pattern at the left knee.    PLAN: gait training     PTA Communication: All portions of the treatment plan may be delegated to the PTA with the exception of spinal joint mobilizations and grade IV and above peripheral joint mobilizations.     Patient was seen for a total of 40 minutes of treatment time.  40 minutes was in services represented by timed CPT codes.    Madie Cahn HALANI Noelia Lenart, PT

## 2021-01-04 ENCOUNTER — Non-Acute Institutional Stay: Payer: Commercial Managed Care - HMO

## 2021-01-06 ENCOUNTER — Ambulatory Visit: Payer: PRIVATE HEALTH INSURANCE

## 2021-01-07 DIAGNOSIS — M21372 Foot drop, left foot: Secondary | ICD-10-CM

## 2021-01-08 NOTE — Progress Notes
PATIENT: Andrew [name]  MRN: [id number]  DOB: 25-Sep-1955  DATE OF SERVICE: 01/06/2021    SUBJECTIVE       Chrishun Scheer was seen at [facility] for adjustment of his left custom thermoplastic AAFO (articulated ankle foot orthosis with PF stop).   ?   Pain: No Pain     Reason for visit: Discomfort of lateral malleolus with AFO.  ?  OBJECTIVE      Patient accompanied by his wife, [name]. Patient arrived in [place]; ambulates with LT AFO + FWW.  ?  ASSESSMENT   ?  Orthotic Adjustments Today: Left AAFO was re-fabricated with neutral hindfoot position (excess hindfoot inversion in original AFO). Added 1/8'' Aliplast adjacent and proximal to lateral malleolus on AFO. Trimmed AFO footplate proximal to met heads and to proper length. Lowered PF (plantarflexion) stop to near neutral ankle position to accommodate ankle tightness.   -------------------------------------------------  After AFO adjustments, patient walked with new left AAFO + FWW for about 10 feet. No further pain/discomfort with AFO. AFO fit/function deemed appropriate and beneficial to patient. Orthosis was examined for structural integrity and found to be sound. Skin check is unremarkable.Patient tolerated well with all procedures.   ?  GOALS / RECOMMENDATION   ??  ?Orthosis Recommendation:?left custom plastic articulated AFO  ?  - Features:?varus-control mods, [place] straight joints, and Snapstop?(PF stop).??  - Ankle Angle:?near neutral position to?accommodate ankle tightness / limited ROM   - Footplate length: Full-length  - Interface: Pelite padding adjacent to posterior calf  ?  ?Clinician-Directed Goals / Medical Necessity: Left custom articulated AFO is required to improve stability and alignment of ankle-foot-complex in multiple planes, controlling gait deviations: left foot drop and triplanar excessive supination. Patient has diagnosis of left hemiparesis and left foot drop secondary to CVA. AFO must have varus-control modifications for optimal AFO control of excessive supination.?  ?  Patient Goals: [name] functional goals include the following:?control LT foot drop / over-supination, well-fitting AFO, improve mobility.  ?  PLAN   ?  Education: Verbally educated patient on AFO adjustments, AFO function, skin checks following use of orthosis, use and care, & how to contact office as needed for questions/follow-up. Barrier: None. Needs: Knows wellOutcome: Able to repeat information and/or demonstrate what was taught.  ?  Plan: Will follow-up as warranted. Please call at [phone].   Loleta Chance, [facility]    Author:  Loleta Chance 01/07/2021 6:36 PM

## 2021-01-18 ENCOUNTER — Ambulatory Visit: Payer: PRIVATE HEALTH INSURANCE

## 2021-01-18 DIAGNOSIS — M21372 Foot drop, left foot: Secondary | ICD-10-CM

## 2021-01-18 NOTE — Progress Notes
PATIENT: Andrew [name]  MRN: [id number]  DOB: Nov 23, 1955  DATE OF SERVICE: 01/18/2021      PATIENT: Andrew [name]  MRN: [id number]  DOB: [birth year]    Date of Visit: 01/18/2021    SUBJECTIVE            Reason for visit: Broken rivet on AFO calf strap.     No pain with LT custom hinged AFO.    OBJECTIVE       [name] wife, [name], brought AFO for minor adjustment.    ASSESSMENT        Replaced rivet on AFO. Following adjustment, AFO has sound structural integrity and fit/function deemed appropriate.      Patient and wife informed to contact P&O if there are further questions or follow-up/adjustment needed.    PLAN      F/U as needed. [id number]      Date of Visit: 01/18/2021    Loleta Chance, [facility]        Author:  Loleta Chance 01/18/2021 1:36 PM

## 2021-01-19 ENCOUNTER — Telehealth: Payer: Commercial Managed Care - HMO

## 2021-01-19 NOTE — Telephone Encounter
Call Back Request      Reason for call back: [name] wife, [name], is calling stating that the referral for the wheelchair and walker was incomplete.   Please advise. Thank You.     Any Symptoms:  []  Yes  [x]  No       If yes, what symptoms are you experiencing:    o Duration of symptoms (how long):    o Have you taken medication for symptoms (OTC or Rx):      If call was taken outside of clinic hours:    [] Patient or caller has been notified that this message was sent outside of normal clinic hours.     [] Patient or caller has been warm transferred to the physician's answering service. If applicable, patient or caller informed to please call back if symptoms progress.  Patient or caller has been notified of the turnaround time of 1-2 business day(s).

## 2021-01-25 NOTE — Treatment Summary
PHYSICAL THERAPY TREATMENT NOTE    PATIENT: Andrew [name]  GENDER: male  AGE: 65 y.o.  DOB: 11-02-1955  MRN: [id number]    Diagnosis:     ICD-10-CM    1. Cerebrovascular accident (CVA), unspecified mechanism (HCC/RAF)  I63.9         HMO:    Visit Number: 12 of 16  Plan of Care Expires:  05/01/2021  Authorization Expires: 02/07/2021    Precautions: left hemiparesis     Pain:No    SUBJECTIVE:  Received in wheel chair with wife, [name], present. Patient reports doing exercises at home.     OBJECTIVE:   Outcome Measure(s):   Activities-Specific Balance Confidence (ABC) Scale Score - % confidence: 36.25                                                     Outcome Measure(s):   Timed Up and Go Score (seconds): 63.13 seconds                             **used gait belt and front wheel walker. Required minimal assist.       Outcome Measure(s):   Five Times Sit to Stand Test Score (seconds): 38.38 seconds        **used gait belt and front wheel walker. Required contact guard assist.         Patient Education:   Education Content: outcome measure    Education Delivery Method: verbal and demonstration   Education Response: verbalizes understanding    ASSESSMENT:  Treatment today focused on getting outcome measures. Patient required contact guard to minimal assist with out ome measure tests.     PLAN: gait training     PTA Communication: All portions of the treatment plan may be delegated to the PTA with the exception of spinal joint mobilizations and grade IV and above peripheral joint mobilizations.     Patient was seen for a total of 40 minutes of treatment time.  40 minutes was in services represented by timed CPT codes.    Clinten Howk Olive Bass, PTA

## 2021-01-26 NOTE — Telephone Encounter
Call Back Request      Reason for call back:     Patients wife, Andrew [name], is calling in regards to encounter below she is stating there are some missing items for the referral to be approved  The missing items for referral #[id number] wheelchair and walker #[id number] procedure codes and refer to location     Please advise and follow up with wife  Thank you     Any Symptoms:  []  Yes  [x]  No       If yes, what symptoms are you experiencing:    o Duration of symptoms (how long):    o Have you taken medication for symptoms (OTC or Rx):      If call was taken outside of clinic hours:    [] Patient or caller has been notified that this message was sent outside of normal clinic hours.     [] Patient or caller has been warm transferred to the physician's answering service. If applicable, patient or caller informed to please call back if symptoms progress.  Patient or caller has been notified of the turnaround time of 1-2 business day(s).

## 2021-01-26 NOTE — Telephone Encounter
Message to Practice/Provider      Message:   [name] wife is calling in regards to encounter below stating the insurance told her the refer to is item missing it to [place]    Return call is not being requested by the patient or caller.    Patient or caller has been notified of the turnaround time of 1-2 business day(s).

## 2021-01-27 ENCOUNTER — Ambulatory Visit: Payer: PRIVATE HEALTH INSURANCE

## 2021-01-27 DIAGNOSIS — I639 Cerebral infarction, unspecified: Secondary | ICD-10-CM

## 2021-01-27 NOTE — Telephone Encounter
Referrals updated with the appropriate CPT codes. Currently pending for authorization

## 2021-01-27 NOTE — Telephone Encounter
Referrals will be updated once I receive the appropriate CPT codes for a standard WC and front wheel walker. Reached out to Clute at [employer] to inquire. Waiting for a response.

## 2021-01-31 NOTE — Progress Notes
PHYSICAL THERAPY PROGRESS REPORT     Reporting Period: 10/10/2020 to 01/08/2021    PATIENT: Andrew [name]  GENDER: male  AGE: 65 y.o.  DOB: 1955/04/27  MRN: [id number]    Diagnosis:     ICD-10-CM    1. Cerebrovascular accident (CVA), unspecified mechanism (HCC/RAF)  I63.9        Plan of Care Expires: 05/01/2021  Authorization Expires: 04/01/2021    Precautions: left hemiparesis     OBJECTIVE:   Outcome Measure(s):   Activities-Specific Balance Confidence (ABC) Scale Score - % confidence: 36.25                                                 ?  ?  Outcome Measure(s):   Timed Up and Go Score (seconds): 63.13 seconds                             **used gait belt and front wheel walker. Required minimal assist.   ?  ?  Outcome Measure(s):   Five Times Sit to Stand Test Score (seconds): 38.38 seconds        **used gait belt and front wheel walker. Required contact guard assist.         Short Term Goals: 6 weeks -- progressing towards  Patient will demonstrate improvement in core strength by maintaining neutral posture while performing seated marching bilaterally.  ?  Patient will perform 5 time sit to stand within 18 seconds while demonstrating proper hip flexion posture.  ?  Patient will demonstrate improvement in single limb stance up to 5 seconds bilaterally to decrease risk of falls.   ?    Long Term Goals: 12 weeks --  progressing towards  Patient will be able to perform timed up and go under 60 seconds with front wheeled walker to demonstrate improvement in endurance  ?  Patient will be able to increase ambulation endurance by walking minimum of 5 minute bouts.  ?  Patient will be able to perform 180 ? turn within 8 steps or less to decrease risk of falls.   ?  Patient will be independent with home exercise program.      ASSESSMENT:  Patient presents with Cerebrovascular accident (CVA), unspecified mechanism (HCC/RAF)  (primary encounter diagnosis) since 2013. During the initial evaluation, he is noted to having poor left lower extremity weight bearing, flexion synergy pattern with decreased core strength. He lack motivation to perform activities within the clinic and at home and is informed this will be a barrier to his progress. Since the initial evaluation, patient has noted to having improvement in left lower extremity weight bearing and gait pattern with notable improvement in walking pattern and speed but had some barriers in the warm weathered days to practice his walk. He is encouraged to perform increased standing activities at the house to improve equal weight bearing pattern. Patient can benefit from continued physical therapy to address goals and continue to make independence with functional mobility and independence. The patient's primary impairment(s) include decreased range of motion and decreased strength which is limiting their ability to walk community distances , complete work activities and perform a fitness routine. The patient requires skilled therapy that can be safely and effective performed only by a qualified therapist to address the above deficits.    INTERVENTION:  Therapeutic Exercise, Manual Therapy, Neuromuscular Re-education, Therapeutic Activities, Electrical Stimulation, Iontophoresis, Heat Therapy, Cold Therapy, Patient/Caregiver Education, Self Care Training and Gait Training  ?  All portions of the treatment plan can be delegated to the Physical Therapist Assistant with the exception of joint mobilization/manipulation.  ?  Patient to be seen 1 times per week for 12 week(s).  ?  Patient agrees with goals and treatment plan as outlined above:Yes    PLAN: review home exercise program and compliance, add balance exercise progresion    PTA Communication: All portions of the treatment plan may be delegated to the PTA with the exception of spinal joint mobilizations and grade IV and above peripheral joint mobilizations.       Gates Jividen HALANI Adeyemi Hamad, PT

## 2021-01-31 NOTE — Addendum Note
Addended by: Nathaneil Canary on: 01/31/2021 09:26 AM     Modules accepted: Orders

## 2021-02-02 NOTE — Treatment Summary
PHYSICAL THERAPY TREATMENT NOTE    PATIENT: Andrew [name]  GENDER: male  AGE: 65 y.o.  DOB: 04/01/1956  MRN: [id number]    Diagnosis:     ICD-10-CM    1. Cerebrovascular accident (CVA), unspecified mechanism (HCC/RAF)  I63.9         HMO:    Visit Number: 13 of 16  Plan of Care Expires: 05/01/2021  Authorization Expires: 04/01/2021    Precautions: left hemiparesis     Pain:No    SUBJECTIVE:  Received in wheel chair with wife, [name], present. Patient reports doing exercises.        OBJECTIVE:   Good form when standing . Noted poor eccentric control when sitting. Patient lacks hip flexion strategy and with noted posterior weight shift when sitting down.     **gait belt used during session     in parallel bars:  - sit to stand with focus on hip flexion strategy and to allow for knees to bend more for increased anterior weight shift when sitting for better eccentric control x 12 **required moderate to maximum cues and facilitation to correct.   - left lower extremity step up/down on 2 inch step 2 x 10 with minimal assist + occasional minimal to moderate cues and facilitation for increased left knee stepping down.   - left lower extremity step up/down on 4 inch step 1 x 10 with minimal assist + occasional maximum cues and facilitation for increased left knee stepping down.     - Gait training using front wheel walker with focus on increased left hip flexion for better foot clearance and for proper left heel-toe rocker x 15 minutes **moderate to maximum cues and facilitation for proper gait mechanics and for proper adaptive equipment management. Instructed to slow down and take smaller steps.       Patient Education:   Education Content: transfer training, step training, gait training     Education Delivery Method: verbal and demonstration   Education Response: verbalizes understanding    ASSESSMENT:  Patient presents with gait and movement dysfunction. Patient required intermittent moderate to maximum cues and facilitation during gait and transfers to maintain proper form and to correct compensations.      PLAN: gait training     PTA Communication: All portions of the treatment plan may be delegated to the PTA with the exception of spinal joint mobilizations and grade IV and above peripheral joint mobilizations.     Patient was seen for a total of 45 minutes of treatment time.  45 minutes was in services represented by timed CPT codes.    Rickey Sadowski Olive Bass, PTA

## 2021-02-03 ENCOUNTER — Ambulatory Visit: Payer: Commercial Managed Care - HMO

## 2021-02-03 DIAGNOSIS — I639 Cerebral infarction, unspecified: Secondary | ICD-10-CM

## 2021-02-06 NOTE — Telephone Encounter
Call Back Request      Reason for call back:[facility] with [facility] is requesting the patient's authorization along with a prescription signed and dated by Dr. Hampton Abbot to be sent to them for the patiens wheelchair and walker.     FAX: [phone]    Any Symptoms:  []  Yes  [x]  No       If yes, what symptoms are you experiencing:    o Duration of symptoms (how long):    o Have you taken medication for symptoms (OTC or Rx):      If call was taken outside of clinic hours:    [] Patient or caller has been notified that this message was sent outside of normal clinic hours.     [] Patient or caller has been warm transferred to the physician's answering service. If applicable, patient or caller informed to please call back if symptoms progress.  Patient or caller has been notified of the turnaround time of 1-2 business day(s).

## 2021-02-06 NOTE — Telephone Encounter
Requested information faxed to [facility] att[facility] this afternoon

## 2021-02-07 ENCOUNTER — Non-Acute Institutional Stay: Payer: Commercial Managed Care - HMO

## 2021-02-08 ENCOUNTER — Ambulatory Visit: Payer: Commercial Managed Care - HMO

## 2021-02-09 DIAGNOSIS — M6281 Muscle weakness (generalized): Secondary | ICD-10-CM

## 2021-02-09 DIAGNOSIS — I639 Cerebral infarction, unspecified: Secondary | ICD-10-CM

## 2021-02-10 NOTE — Progress Notes
PATIENT: Andrew [name]  MRN: [id number]  DOB: [birth year]  DATE OF SERVICE: 02/08/2021    SUBJECTIVE     [name] AA[facility] (articulated ankle foot orthosis with PF stop).    Pain: No Pain     Reason for visit: Discomfort of lateral malleolus with AFO.    OBJECTIVE     Patient accompanied byhis wife, Andrew [name].Patient arrived in [place]; ambulates with LT AFO + FWW.    ASSESSMENT     Chief Patient Complaint(s): Patient describing excessive pressure of lateral malleolus 2/2 inversion. Patient also prefers AFO to be tighter in width.   AFO was modified: Heat-narrowed calf section. Added padding adjacent and proximal to lateral malleolus.  -------------------------------------------------  After AFO adjustments, patient walked with new left AAFO + FWW. No further pain/discomfort with AFO.   AFO fit/function improved. AFO was examined for structural integrity and found to be sound. Skin check is unremarkable. Patient tolerated well with all procedures.   PLAN     Education:Verbally educated patienton AFO adjustments, AFO function, skin checks following use of orthosis, use and care, &how to contact office as needed for questions/follow-up. Barrier: None. Needs:Knows wellOutcome:Able to repeat information and/or demonstrate what was taught.    Plan: Will follow-up as warranted. Please call at [phone].  Loleta Chance, [facility]  Author:  Loleta Chance 02/09/2021 4:29 PM

## 2021-02-11 MED ORDER — ATORVASTATIN CALCIUM 20 MG PO TABS
ORAL_TABLET | 1 refills
Start: 2021-02-11 — End: ?

## 2021-02-11 MED ORDER — ALLOPURINOL 100 MG PO TABS
ORAL_TABLET | 1 refills
Start: 2021-02-11 — End: ?

## 2021-02-11 MED ORDER — CARVEDILOL 6.25 MG PO TABS
ORAL_TABLET | 2 refills
Start: 2021-02-11 — End: ?

## 2021-02-15 MED ORDER — CARVEDILOL 6.25 MG PO TABS
ORAL_TABLET | 2 refills | Status: AC
Start: 2021-02-15 — End: ?

## 2021-02-15 MED ORDER — ATORVASTATIN CALCIUM 20 MG PO TABS
ORAL_TABLET | 1 refills | Status: AC
Start: 2021-02-15 — End: ?

## 2021-02-15 MED ORDER — ALLOPURINOL 100 MG PO TABS
ORAL_TABLET | 1 refills | Status: AC
Start: 2021-02-15 — End: ?

## 2021-02-21 NOTE — Telephone Encounter
Call Back Request      Reason for call back: Patient spouse calling in regards to wheelchair orders, needs it to be a lightweight 18 inch so she can carry.        Please call patient to assist .     Any Symptoms:  []  Yes  [x]  No       If yes, what symptoms are you experiencing:    o Duration of symptoms (how long):    o Have you taken medication for symptoms (OTC or Rx):      If call was taken outside of clinic hours:    [] Patient or caller has been notified that this message was sent outside of normal clinic hours.     [] Patient or caller has been warm transferred to the physician's answering service. If applicable, patient or caller informed to please call back if symptoms progress.  Patient or caller has been notified of the turnaround time of 1-2 business day(s).

## 2021-02-28 NOTE — Telephone Encounter
Call Back Request      Reason for call back: Pt wife [name] is calling to f/u on encounter below.     Any Symptoms:  []  Yes  [x]  No       If yes, what symptoms are you experiencing:    o Duration of symptoms (how long):    o Have you taken medication for symptoms (OTC or Rx):      If call was taken outside of clinic hours:    [] Patient or caller has been notified that this message was sent outside of normal clinic hours.     [] Patient or caller has been warm transferred to the physician's answering service. If applicable, patient or caller informed to please call back if symptoms progress.  Patient or caller has been notified of the turnaround time of 1-2 business day(s).

## 2021-03-01 NOTE — Treatment Summary
PHYSICAL THERAPY TREATMENT NOTE    PATIENT: Andrew [name]  GENDER: male  AGE: 65 y.o.  DOB: 1955/10/02  MRN: [id number]    Diagnosis:     ICD-10-CM    1. Cerebrovascular accident (CVA), unspecified mechanism (HCC/RAF)  I63.9            HMO:    Visit Number: 14 of 16  Plan of Care Expires: 05/01/2021  Authorization Expires: 04/01/2021    Precautions: left hemiparesis     Pain:No    SUBJECTIVE:  Received in wheel chair with wife, [name], present. Patient reports doing sit to stand exercises and walking. [name] wife report step up exercises was helpful for patient. Patient reports he is able to go up/down stairs at home by himself using rail + front wheel walker.     OBJECTIVE: **gait belt used during session   Transfer assessment: still with slight posterior lean tendency noted when sitting down.       in parallel bars:  - sit to stand using hand support on wheel chair x 10 **using minimal to moderate cues and facilitation to correct posterior lean when sitting down.   - left lower extremity step up/down on 2 inch step 1 x 10 with close standby/ contact guard assist  + occasional minimal cues and facilitation for left lower extremity placement.   - left lower extremity step up/down on 4 inch step 1 x 10 with minimal/ contact guard assist + occasional minimal to moderate cues and facilitation for left lower extremity placement and moderate cues and facilitation for left foot clearance when stepping down.   - left lower extremity step up/down on 6 inch step 1 x 8 with minimal assist + occasional moderate cues and facilitation for left lower extremity placement and moderate to maximum cues and facilitation for left foot clearance when stepping down. left lower extremity was shaky going down after 8 reps.     - patient education: discussed that above step sequencing is for left lower extremity strengthening to be used in clinic only for now due to patient needed 2nd person assist for safety. For home, patient recommended to go up with right lower extremity and down with left lower extremity.     **patient required sitting break in between transfers and in between step training activities due to fatigue.      Regular stairs:   - step up/down on 6 inch step using 2 rails: right leg up, left leg down x 3 with minimal to moderate assist + moderate cues and facilitation for pero sequencing.  **instructed patient to do this at home. Instructed wife to stay close to patient when going up/down stairs at home.       Patient Education:   Education Content: transfer training, gait training     Education Delivery Method: verbal and demonstration   Education Response: verbalizes understanding    ASSESSMENT:  Patient still presents with gait and movement dysfunction. Patient still required intermittent moderate to maximum cues and facilitation during step eactivity to maintain proper form and sequencing and to correct compensations. Improved ability to go up and down steps from last session. Patient required less assist.       PLAN: gait training     PTA Communication: All portions of the treatment plan may be delegated to the PTA with the exception of spinal joint mobilizations and grade IV and above peripheral joint mobilizations.     Patient was seen for a total of 45 minutes of treatment  time.  45 minutes was in services represented by timed CPT codes.    Jalin Alicea Olive Bass, PTA

## 2021-03-03 ENCOUNTER — Ambulatory Visit: Payer: Commercial Managed Care - HMO

## 2021-03-03 DIAGNOSIS — I639 Cerebral infarction, unspecified: Secondary | ICD-10-CM

## 2021-03-03 NOTE — Telephone Encounter
Patients wife was in clinic and was inquiring about the authorization for a wheel chair , spoke to [place] and the fax was sent over to [employer] , pt will follow up with [employer] to see if there is a specific code for a light wheelchair that she is requesting.

## 2021-03-08 ENCOUNTER — Ambulatory Visit: Payer: PRIVATE HEALTH INSURANCE | Attending: Cardiovascular Disease

## 2021-03-08 DIAGNOSIS — Z952 Presence of prosthetic heart valve: Secondary | ICD-10-CM

## 2021-03-08 DIAGNOSIS — I350 Nonrheumatic aortic (valve) stenosis: Secondary | ICD-10-CM

## 2021-03-08 DIAGNOSIS — I639 Cerebral infarction, unspecified: Secondary | ICD-10-CM

## 2021-03-08 DIAGNOSIS — I10 Essential (primary) hypertension: Secondary | ICD-10-CM

## 2021-03-08 NOTE — Progress Notes
Cardiology Clinic    PATIENT: Andrew [name]  MRN: [id number]  DOB: 11-17-1955  DATE OF SERVICE: 03/08/2021      REFERRING PHYSICIAN: Thayer Headings., MD    REASON FOR VISIT:  History of aortic valve replacement, routine follow-up    HISTORY OF PRESENT ILLNESS:  Andrew [name] is a delightful 65 y.o. gentleman with past medical history significant for bicuspid aortic valve status post aortic valve replacement in January of 2013 with perioperative CVA, chronic ST elevation in inferior leads, hypertension.  He was referred to our Cardiology Clinic by Dr. Tora Duck to establish care.  I reviewed his previous records in detail.    Andrew [name] has a history of bicuspid aortic valve.  In January 2013 he underwent aortic valve replacement with bioprosthetic valve at [facility].  Unfortunately, after the procedure he had CVA with residual left leg paresis.  In the perioperative period he was found to have ST elevations in the inferior leads for which he underwent coronary angiogram which was normal.  He had repeat angiogram in May of 2013 for similar findings.  He also has a history of hypertension for which he is on amlodipine and losartan.    Andrew [name] reports of doing well from cardiovascular standpoint.  He denies of having any chest pain, shortness of breath, orthopnea or paroxysmal nocturnal dyspnea.  Furthermore, he denies of having any dizziness, palpitations or lower extremity edema.  His blood pressure has been well controlled at home.    Andrew [name] is originally from [place]  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  He lives in [place].  He stopped smoking 40 years ago.  He drinks alcohol seldomly in social situations.  There is no history of illicit drug use.  His family history significant for brother who had stroke in his 11s.    Visit 09/21/2020  No change from cardiovascular standpoint.  He denies of having any chest pain, shortness of breath, orthopnea paroxysmal nocturnal dyspnea.  Furthermore, he denies of having any lightheadedness, lower extremity edema or palpitations.  Limited in ambulation due to left-sided hemiparesis as a consequence of stroke.  Had echocardiogram this morning.  Final read pending.  Normal valve function.  Good blood pressure control at home.    INTERVAL HISTORY:  Visit 03/08/2021:  Doing well. Continues to do physical therapy. Feels stronger.   No chest pain, shortness of breath, palpitations or dizziness.   BP has been normal at home.  Limited ambulation due to hemiparesis, can walk with a walker.   Echocardiogram and CT chest stable.  Planning to leave for [place] in the next few weeks. Will stay for three months.     PAST MEDICAL HISTORY:  Patient Active Problem List   Diagnosis   ? Abnormality of gait   ? Hx of medication noncompliance   ? Muscle weakness (generalized)   ? 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 Aortic stenosis   ? History of aortic valve replacement   ? Hypertension   ? Gout   ? Seborrheic dermatitis   ? Urinary frequency   ? CVA (cerebral vascular accident) (HCC/RAF)   ? H/O aortic valve replacement   ? Bicuspid aortic valve       PAST SURGICAL HISTORY:  Past Surgical History:   Procedure Laterality Date   ? AORTIC VALVE REPLACEMENT         ALLERGIES:  No Known  Allergies    SOCIAL HISTORY:  Social History     Tobacco Use   ? Smoking status: Former     Packs/day: 1.00     Types: Cigarettes     Start date: 05/03/1975     Quit date: 05/02/1990     Years since quitting: 30.8   ? Smokeless tobacco: Never   Substance Use Topics   ? Alcohol use: Not Currently     Comment: prior heavy use when he was much younger   ? Drug use: Not Currently       FAMILY HISTORY:  Family History   Problem Relation Age of Onset   ? No Known Problems Mother    ? Other (gastric ulcer) Father    ? Uterine cancer Sister    ? Stroke Brother    ? Kidney failure Brother    ? Colon cancer Neg Hx    ? Lung cancer Neg Hx ? Prostate cancer Neg Hx        REVIEW OF SYSTEMS:  All 14 systems were reviewed and were negative except as mentioned in the HPI    PHYSICAL EXAMINATION:  VITALS: BP 114/79  ~ Pulse 73  ~ Temp 36.3 ?C (97.4 ?F) (Forehead)  ~ Resp 16  ~ Ht 5' 2'' (1.575 m)  ~ SpO2 97%  ~ BMI 35.03 kg/m?    General: Alert and oriented, not in acute distress.  HEENT: Normocephalic, atraumatic. Sclerae are anicteric.   Neck: Supple, no lymphadenopathy, JVP flat  Respiratory: Clear to auscultation bilaterally, no wheezing or rales.   Cardiac: RRR, S1/S2 normal, no murmurs, rubs or gallops. Peripheral pulses present.  Abdomen: Soft, non-tender, non-distended, normoactive bowel sounds.  Extremities: No cyanosis, clubbing or edema.    Integument: Warm, no petechiae. No acute rash.  Neurologic/Psychiatric: Alert and oriented. Affect is appropriate.       OUTPATIENT MEDICATIONS:  Current Outpatient Medications   Medication Sig   ? ALLOPURINOL 100 mg tablet TAKE 2 TABLET BY MOUTH DAILY   ? amLODIPine 5 mg tablet Take 1 tablet (5 mg total) by mouth daily.   ? aspirin 81 mg EC tablet Take 81 mg by mouth daily.   ? ATORVASTATIN 20 mg tablet TAKE 1 TABLET BY MOUTH AT BEDTIME   ? CARVEDILOL 6.25 mg tablet TAKE 1 TABLET BY MOUTH TWO TIMES DAILY.   ? FOLIC ACID PO Take 1,000 mcg by mouth daily.   ? losartan 25 mg tablet Take 1 tablet (25 mg total) by mouth two (2) times daily.   ? Multiple Vitamins-Minerals (HCA SUPER THERAVITE-M PO) Take by mouth daily.   ? acetaminophen (TYLENOL) 325 mg tablet Take 650 mg by mouth every six (6) hours as needed. SKD TEST (Patient not taking: Reported on 11/10/2020.)   ? acetaminophen (TYLENOL) 500 mg tablet Take 500 mg by mouth every six (6) hours as needed. (Patient not taking: Reported on 11/10/2020.)   ? ipratropium 0.03% nasal spray Spray 2 sprays in each nare every twelve (12) hours. (Patient not taking: Reported on 03/08/2021.)     No current facility-administered medications for this visit. LABORATORY:  Lab Results   Component Value Date    WBC 5.48 07/23/2020    HGB 15.9 07/23/2020    HCT 47.0 07/23/2020    MCV 86.9 07/23/2020    PLT 203 07/23/2020      Lab Results   Component Value Date    CREAT 0.82 10/18/2020    BUN 14 10/18/2020  NA 141 10/18/2020    K 4.0 10/18/2020    CL 102 10/18/2020    CO2 27 10/18/2020      Lab Results   Component Value Date    HGBA1C 6.1 (H) 07/23/2020      Lab Results   Component Value Date    ALT 23 10/18/2020    AST 19 10/18/2020    ALKPHOS 50 10/18/2020    BILITOT 0.7 10/18/2020    No results found for: TSH   Lab Results   Component Value Date    CALCIUM 9.7 10/18/2020      Lab Results   Component Value Date    CHOL 122 07/23/2020    CHOLHDL 38 (L) 07/23/2020    CHOLDLCAL 71 07/23/2020    CHOLDLQ 61 03/11/2012    TRIGLY 67 07/23/2020           DIAGNOSTIC STUDIES:  ECHO 04/20/2019:  1. Left ventricle is normal in size with mild hypertrophy. Left ventricular   function is preserved and visually estimated to be 60-65%. There are no wall   motion abnormalities.   2. Normal biatrial?size.   3. Normal right ventricular size and systolic function.   4. Bioprosthetic valve (25-mm Carpentier-Edwards Magna Ease ; 1/13; Dr. Charyl Dancer)   is well seated in the aortic position. Peak velocity of 2.26 m/sec, mean   gradient of 11 mmHg, DI of 0.46, effective orifice area of 1.44 cm^2 (based   on LVOT diameter of 2cm)--consistent with normal prosthetic function.   5. Lack of an adequate TR signal precludes accurate estimation of right   ventricular systolic pressure. IVC diameter is = 2.1 cm with a > 50%   inspiratory collapse, suggestive of a right atrial pressure of 0-5 mmHg    ECHO 09/21/2020:   1. Normal LV size, mild septal hypertrophy, normal systolic function, normal LV diastolic function.   2. Left ventricular ejection fraction is approximately 60 to 65%.   3. Normal right ventricular size and normal systolic function.   4. A 25 mm Carpentier bioprosthetic valve is present in the aortic valve position. Echo findings are consistent with normal structure and function of the aortic prosthesis.   5. Normal pulmonary artery systolic pressure.   6. There are no prior echocardiograms available for comparison.    CT chest 09/21/2020:  Postoperative changes of aortic valve replacement. Ectasia of the ascending aorta measures up to 3.9 cm in the proximal arch grossly unchanged since 2013    ECG 06/07/2020:  Normal sinus rhythm at ventricular rate of 74 beats per minute.  Mild ST elevation in inferior leads.  Described before.     []  Reviewed/ordered radiology  []  Independently interpreted studies    [x]  Reviewed/ordered labs  [x]  Reviewed & summarized old records    [x]  Reviewed/ordered diag med test   []  Decided to get outside medical records      []  Obtained history from someone other than patient    ASSESSMENT/PLAN:  1. Aortic valve replacement in 2013  Normal valve function on most recent echocardiogram in June 2022. Repeat annually.  We discussed that some degeneration of the bioprosthetic valve can be expected 10 years after surgery so he will need close follow-up with serial echocardiograms  Continue aspirin    2. Bicuspid aortic valve  We discussed with there is an increased risk of having aortic aneurysm in patients with bicuspid aortic valve  Will need serial imaging. Most recent test in 08/2020. Stable from 2013. Will screen  biannually  First-degree family members should be screened with echocardiograms    3. History of CVA  Residual leg weakness    4. Hyperlipidemia  On atorvastatin    5. Abnormal EKG  Patient has ST elevations in the inferior leads which have been present before.  I reviewed the records.  No further testing is necessary in this regard.  He had 2 negative coronary angiograms    6. Hypertension  Good control on Coreg, anlodipine and Losartan.     Please note, I spent a total of 30 minutes for this visit including chart review, test interpretation, history taking, physical examination, counseling, coordination of care and documentation.    Plan of care was discussed with the patient and family in detail. All questions answered.    Charlton Amor, MD  Advanced Heart Failure and Transplantation Cardiology  Pager [phone]

## 2021-03-09 ENCOUNTER — Non-Acute Institutional Stay: Payer: PRIVATE HEALTH INSURANCE

## 2021-03-09 ENCOUNTER — Telehealth: Payer: PRIVATE HEALTH INSURANCE

## 2021-03-09 DIAGNOSIS — G8194 Hemiplegia, unspecified affecting left nondominant side: Secondary | ICD-10-CM

## 2021-03-09 NOTE — Telephone Encounter
New DME order completed and submitted to [facility]. Approval pending    Called and notified Mrs.Dugar.

## 2021-03-09 NOTE — Telephone Encounter
Call Back Request      Reason for call back: Please see signed encounter on 01/19/21. Pt's wife stated the current wheel chair that was order it too heavy for her to lift and is requesting a new order for a lighter wheel chair. Wife requests to please submit a new order to [employer] with code #[id number] weight 18 in wheel chair.     Any Symptoms:  []  Yes  [x]  No       If yes, what symptoms are you experiencing:    o Duration of symptoms (how long):    o Have you taken medication for symptoms (OTC or Rx):      If call was taken outside of clinic hours:    [] Patient or caller has been notified that this message was sent outside of normal clinic hours.     [] Patient or caller has been warm transferred to the physician's answering service. If applicable, patient or caller informed to please call back if symptoms progress.  Patient or caller has been notified of the turnaround time of 1-2 business day(s).  Thank you

## 2021-03-13 NOTE — Telephone Encounter
Referral approved. Referral and auth faxed to [facility]- [phone] and [phone]    Called and notified [name]

## 2021-03-17 ENCOUNTER — Ambulatory Visit: Payer: PRIVATE HEALTH INSURANCE

## 2021-03-22 ENCOUNTER — Ambulatory Visit: Payer: PRIVATE HEALTH INSURANCE

## 2021-03-22 ENCOUNTER — Non-Acute Institutional Stay: Payer: PRIVATE HEALTH INSURANCE

## 2021-03-22 DIAGNOSIS — I639 Cerebral infarction, unspecified: Secondary | ICD-10-CM

## 2021-03-22 NOTE — Treatment Summary
PHYSICAL THERAPY TREATMENT NOTE    PATIENT: Andrew [name]  GENDER: male  AGE: 65 y.o.  DOB: 1956/01/11  MRN: [id number]    Diagnosis:     ICD-10-CM    1. Cerebrovascular accident (CVA), unspecified mechanism (HCC/RAF)  I63.9            HMO:    Visit Number: 15 of 16  Plan of Care Expires: 05/01/2021  Authorization Expires: 04/01/2021    Precautions: left hemiparesis     Pain:No    SUBJECTIVE:  Received in wheel chair with wife, [name], present. Patient is walking to and from bed to living room to bathroom with front wheeled walker.  He does not feel out of breathe but is challenging.  He uses front wheeled walker with ambulation.  When asked he is walking intermittently throughout the day versus using the wheelchair.     OBJECTIVE: **gait belt used during session   Discussed walking amount to make sure patient is getting enough frequency with walking during the day even though stays mostly in the house.    Sit to stand from wheelchair with front wheeled walker  - scooting back posterior push into the back rest  - sit to stand x 5 with proper scooting    Gait with front wheeled walker x 30 feet from mat to the pbs  Turning observation:    In parallel bars:  - standing posture without increased right side weight shift and left trunk/pelvic rotation  - single limb balance with right hand hold // cued for less posterior weight shift with hip flexion   - double limb balance without bilateral upper extremities    - eyes open   - eyes closed   - standing forward and backward weight shift  - turning with high knees to help with stepping pattern     Walking from parallel bars back to the mat x 30 fee with cues to avoid left trunk and pelvic rotation and cued to stay closer to the walker with arms relaxed  // seated rest break    Hands across the chest:  Seated with backward lean  Seated with back-right lean  Seated with back-left lean  // helped with left lower extremity stability    Reviewed importance of standing and sitting posture to help with stability and balance.  Reviewed stepping motions with wife to help reinforce better habits with walking and turning at home.    march he is back from [place] ***    Patient Education:   Education Content: transfer training, gait training     Education Delivery Method: verbal and demonstration   Education Response: verbalizes understanding    ASSESSMENT:  Patient still presents with gait and movement dysfunction. Patient still required intermittent moderate to maximum cues and facilitation during step eactivity to maintain proper form and sequencing and to correct compensations. Improved ability to go up and down steps from last session. Patient required less assist.       PLAN: gait training; stair training; balance training - double limb and single limb stance stability with even weight bearing     PTA Communication: All portions of the treatment plan may be delegated to the PTA with the exception of spinal joint mobilizations and grade IV and above peripheral joint mobilizations.     Patient was seen for a total of 45 minutes of treatment time.  45 minutes was in services represented by timed CPT codes.    Angelly Spearing D. Vannesa Abair, PT

## 2021-03-28 ENCOUNTER — Non-Acute Institutional Stay: Payer: PRIVATE HEALTH INSURANCE | Attending: Cardiovascular Disease

## 2021-05-17 NOTE — Progress Notes
Physical Therapy Discharge Summary    Reason for Discharge: no contact from patient in over 60 days and insurance coverage issues    Refer to previous information regarding functional status       Discharge physical therapy at this time.  Disposition: unable to assess     Nathaneil Canary, PT

## 2021-06-23 MED ORDER — AMLODIPINE BESYLATE 5 MG PO TABS
ORAL_TABLET | ORAL | 0 refills | 60.00000 days
Start: 2021-06-23 — End: ?

## 2021-06-23 MED ORDER — LOSARTAN POTASSIUM 25 MG PO TABS
ORAL_TABLET | 0 refills
Start: 2021-06-23 — End: ?

## 2021-06-26 MED ORDER — AMLODIPINE BESYLATE 5 MG PO TABS
ORAL_TABLET | 0 refills | Status: AC
Start: 2021-06-26 — End: ?

## 2021-06-26 MED ORDER — LOSARTAN POTASSIUM 25 MG PO TABS
ORAL_TABLET | 0 refills | Status: AC
Start: 2021-06-26 — End: ?

## 2021-07-25 MED ORDER — ALLOPURINOL 100 MG PO TABS
ORAL_TABLET | 0 refills | Status: AC
Start: 2021-07-25 — End: ?

## 2021-07-25 MED ORDER — ATORVASTATIN CALCIUM 20 MG PO TABS
ORAL_TABLET | 0 refills | Status: AC
Start: 2021-07-25 — End: ?

## 2021-08-29 ENCOUNTER — Telehealth: Payer: Commercial Managed Care - HMO

## 2021-08-30 MED ORDER — LOSARTAN POTASSIUM 25 MG PO TABS
25 mg | ORAL_TABLET | Freq: Two times a day (BID) | ORAL | 3 refills | Status: AC
Start: 2021-08-30 — End: ?

## 2021-08-30 MED ORDER — LOSARTAN POTASSIUM 25 MG PO TABS
ORAL_TABLET | 3 refills | 90.00000 days | Status: AC
Start: 2021-08-30 — End: ?

## 2021-08-30 NOTE — Telephone Encounter
Call Back Request      Reason for call back:  Patients wife, Andrew [name] is calling in regards to refill for LOSARTAN 25 mg tablet.    Andrew [name] was advise by their pharmacy that medication request was denied.    Patient is all out of medication and has a appt with Dr. Burnard Bunting scheduled for 09/06/21.    Please call patients wife back to advise if they will be able to get medication approved tonight or before his visit.    Andrew [name] is requesting a call back asap   Thank you       Any Symptoms:  []  Yes  [x]  No       If yes, what symptoms are you experiencing:    o Duration of symptoms (how long):    o Have you taken medication for symptoms (OTC or Rx):      If call was taken outside of clinic hours:    [] Patient or caller has been notified that this message was sent outside of normal clinic hours.     [] Patient or caller has been warm transferred to the physician's answering service. If applicable, patient or caller informed to please call back if symptoms progress.  Patient or caller has been notified of the turnaround time of 1-2 business day(s).

## 2021-08-30 NOTE — Telephone Encounter
I called and spoke to pt's wife to let her know that medication was signed yesterday by Dr. Burnard Hawthorne. Wife says they bought medication yesterday but will have refills for the next time.

## 2021-09-06 ENCOUNTER — Ambulatory Visit: Payer: Commercial Managed Care - HMO | Attending: Cardiovascular Disease

## 2021-09-18 MED ORDER — ALLOPURINOL 100 MG PO TABS
ORAL_TABLET | 0 refills
Start: 2021-09-18 — End: ?

## 2021-09-18 MED ORDER — ATORVASTATIN CALCIUM 20 MG PO TABS
ORAL_TABLET | 0 refills
Start: 2021-09-18 — End: ?

## 2021-09-19 MED ORDER — ATORVASTATIN CALCIUM 20 MG PO TABS
ORAL_TABLET | 0 refills | Status: AC
Start: 2021-09-19 — End: ?

## 2021-09-19 MED ORDER — ALLOPURINOL 100 MG PO TABS
ORAL_TABLET | 0 refills | Status: AC
Start: 2021-09-19 — End: ?

## 2021-09-28 MED ORDER — AMLODIPINE BESYLATE 5 MG PO TABS
ORAL_TABLET | ORAL | 3 refills | 60.00000 days | Status: AC
Start: 2021-09-28 — End: ?

## 2021-10-10 ENCOUNTER — Ambulatory Visit: Payer: PRIVATE HEALTH INSURANCE | Attending: Cardiovascular Disease

## 2021-10-10 NOTE — Progress Notes
Cardiology Clinic    PATIENT: Andrew [name]  MRN: [id number]  DOB: 1955/12/08  DATE OF SERVICE: 10/10/2021      REFERRING PHYSICIAN: Thayer Headings., MD    REASON FOR VISIT:  History of aortic valve replacement, routine follow-up    HISTORY OF PRESENT ILLNESS:  Andrew [name] is a delightful 66 y.o. gentleman with past medical history significant for bicuspid aortic valve status post aortic valve replacement in January of 2013 with perioperative CVA, chronic ST elevation in inferior leads, hypertension.  He was referred to our Cardiology Clinic by Dr. Tora Duck to establish care.  I reviewed his previous records in detail.    Andrew [name] has a history of bicuspid aortic valve.  In January 2013 he underwent aortic valve replacement with bioprosthetic valve at [facility].  Unfortunately, after the procedure he had CVA with residual left leg paresis.  In the perioperative period he was found to have ST elevations in the inferior leads for which he underwent coronary angiogram which was normal.  He had repeat angiogram in May of 2013 for similar findings.  He also has a history of hypertension for which he is on amlodipine and losartan.    Andrew [name] reports of doing well from cardiovascular standpoint.  He denies of having any chest pain, shortness of breath, orthopnea or paroxysmal nocturnal dyspnea.  Furthermore, he denies of having any dizziness, palpitations or lower extremity edema.  His blood pressure has been well controlled at home.    Andrew [name] is originally from [place]  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  He lives in [place].  He stopped smoking 40 years ago.  He drinks alcohol seldomly in social situations.  There is no history of illicit drug use.  His family history significant for brother who had stroke in his 75s.    Visit 09/21/2020  No change from cardiovascular standpoint.  He denies of having any chest pain, shortness of breath, orthopnea paroxysmal nocturnal dyspnea.  Furthermore, he denies of having any lightheadedness, lower extremity edema or palpitations.  Limited in ambulation due to left-sided hemiparesis as a consequence of stroke.  Had echocardiogram this morning.  Final read pending.  Normal valve function.  Good blood pressure control at home.    Visit 03/08/2021:  Doing well. Continues to do physical therapy. Feels stronger.   No chest pain, shortness of breath, palpitations or dizziness.   BP has been normal at home.  Limited ambulation due to hemiparesis, can walk with a walker.   Echocardiogram and CT chest stable.  Planning to leave for [place] in the next few weeks. Will stay for three months.     INTERVAL HISTORY:  Visit 10/11/2021:  He continues to do well from cardiovascular perspective. More specifically he denies of having any chest pain, shortness of breath, orthopnea or paroxysmal nocturnal dyspnea. Furthermore, he denies of having any palpitations, LE edema or lightheadedness.   Completed physical theapy.   Limited ambulation due to hemiparesis, can walk with a walker.   BP has been normal  Stayed in [place] for 3 months    PAST MEDICAL HISTORY:  Patient Active Problem List   Diagnosis   ? Abnormality of gait   ? Hx of medication noncompliance   ? Muscle weakness (generalized)   ? 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 Aortic stenosis   ? History of aortic valve replacement   ?  Hypertension   ? Gout   ? Seborrheic dermatitis   ? Urinary frequency   ? CVA (cerebral vascular accident) (HCC/RAF)   ? H/O aortic valve replacement   ? Bicuspid aortic valve       PAST SURGICAL HISTORY:  Past Surgical History:   Procedure Laterality Date   ? AORTIC VALVE REPLACEMENT         ALLERGIES:  No Known Allergies    SOCIAL HISTORY:  Social History     Tobacco Use   ? Smoking status: Former     Packs/day: 1.00     Types: Cigarettes     Start date: 05/03/1975     Quit date: 05/02/1990     Years since quitting: 31.4   ? Smokeless tobacco: Never   Substance Use Topics   ? Alcohol use: Not Currently     Comment: prior heavy use when he was much younger   ? Drug use: Not Currently       FAMILY HISTORY:  Family History   Problem Relation Age of Onset   ? No Known Problems Mother    ? Other (gastric ulcer) Father    ? Uterine cancer Sister    ? Stroke Brother    ? Kidney failure Brother    ? Colon cancer Neg Hx    ? Lung cancer Neg Hx    ? Prostate cancer Neg Hx        REVIEW OF SYSTEMS:  All 14 systems were reviewed and were negative except as mentioned in the HPI    PHYSICAL EXAMINATION:  VITALS: BP 100/67  ~ Pulse 64  ~ Temp 36.6 ?C (97.8 ?F) (Forehead)  ~ Resp 16  ~ SpO2 98%    General: Alert and oriented, not in acute distress.  HEENT: Normocephalic, atraumatic. Sclerae are anicteric.   Neck: Supple, no lymphadenopathy, JVP flat  Respiratory: Clear to auscultation bilaterally, no wheezing or rales.   Cardiac: RRR, S1/S2 normal, no murmurs, rubs or gallops. Peripheral pulses present.  Abdomen: Soft, non-tender, non-distended, normoactive bowel sounds.  Extremities: No cyanosis, clubbing or edema.    Integument: Warm, no petechiae. No acute rash.  Neurologic/Psychiatric: Alert and oriented. Affect is appropriate.       OUTPATIENT MEDICATIONS:  Current Outpatient Medications   Medication Sig   ? ALLOPURINOL 100 mg tablet TAKE 2 TABLETS BY MOUTH EVERY DAY   ? AMLODIPINE 5 mg tablet TAKE 1 TABLET BY MOUTH EVERY DAY   ? aspirin 81 mg EC tablet Take 81 mg by mouth daily.   ? ATORVASTATIN 20 mg tablet TAKE 1 TABLET BY MOUTH EVERYDAY AT BEDTIME   ? CARVEDILOL 6.25 mg tablet TAKE 1 TABLET BY MOUTH TWO TIMES DAILY.   ? FOLIC ACID PO Take 1,000 mcg by mouth daily.   ? LOSARTAN 25 mg tablet TAKE 1 TABLET BY MOUTH TWICE A DAY   ? losartan 25 mg tablet Take 1 tablet (25 mg total) by mouth two (2) times daily.   ? Multiple Vitamins-Minerals (HCA SUPER THERAVITE-M PO) Take by mouth daily.     No current facility-administered medications for this visit.       LABORATORY:  Lab Results   Component Value Date    WBC 5.48 07/23/2020    HGB 15.9 07/23/2020    HCT 47.0 07/23/2020    MCV 86.9 07/23/2020    PLT 203 07/23/2020      Lab Results   Component Value Date    CREAT 0.82 10/18/2020  BUN 14 10/18/2020    NA 141 10/18/2020    K 4.0 10/18/2020    CL 102 10/18/2020    CO2 27 10/18/2020      Lab Results   Component Value Date    HGBA1C 6.1 (H) 07/23/2020      Lab Results   Component Value Date    ALT 23 10/18/2020    AST 19 10/18/2020    ALKPHOS 50 10/18/2020    BILITOT 0.7 10/18/2020    No results found for: ''TSH''   Lab Results   Component Value Date    CALCIUM 9.7 10/18/2020      Lab Results   Component Value Date    CHOL 122 07/23/2020    CHOLHDL 38 (L) 07/23/2020    CHOLDLCAL 71 07/23/2020    CHOLDLQ 61 03/11/2012    TRIGLY 67 07/23/2020           DIAGNOSTIC STUDIES:  ECHO 04/20/2019:  1. Left ventricle is normal in size with mild hypertrophy. Left ventricular   function is preserved and visually estimated to be 60-65%. There are no wall   motion abnormalities.   2. Normal biatrial?size.   3. Normal right ventricular size and systolic function.   4. Bioprosthetic valve (25-mm Carpentier-Edwards Magna Ease ; 1/13; Dr. Charyl Dancer)   is well seated in the aortic position. Peak velocity of 2.26 m/sec, mean   gradient of 11 mmHg, DI of 0.46, effective orifice area of 1.44 cm^2 (based   on LVOT diameter of 2cm)--consistent with normal prosthetic function.   5. Lack of an adequate TR signal precludes accurate estimation of right   ventricular systolic pressure. IVC diameter is = 2.1 cm with a > 50%   inspiratory collapse, suggestive of a right atrial pressure of 0-5 mmHg    ECHO 09/21/2020:   1. Normal LV size, mild septal hypertrophy, normal systolic function, normal LV diastolic function.   2. Left ventricular ejection fraction is approximately 60 to 65%.   3. Normal right ventricular size and normal systolic function.   4. A 25 mm Carpentier bioprosthetic valve is present in the aortic valve position. Echo findings are consistent with normal structure and function of the aortic prosthesis.   5. Normal pulmonary artery systolic pressure.   6. There are no prior echocardiograms available for comparison.    ECHO 09/06/2021:   1. Normal LV size, mild septal hypertrophy, normal wall motion and systolic function.   2. Left ventricular ejection fraction is approximately 60 to 65%.   3. Mild LV diastolic dysfunction (grade I, impaired relaxation).   4. Normal right ventricular size and grossly normal systolic function.   5. A 25 mm Carpentier bioprosthetic valve is present in the aortic valve position. It appears well seated. No paravalvular regurgitation. Peak aortic velocity is 1.9 m/sec and mean gradient is 8.0 mm Hg. By the continuity equation, the aortic valve area   is 1.74 cm? (Indexed AVA is 0.93 cm?/m?).   6. Upper normal aortic root (3.8 cm). Mildly enlarged mid ascending aorta (4.2 cm).   7. No significant valvular regurgitation.   8. Tricuspid regurgitant jet inadequate to assess pulmonary artery systolic pressure.   9. A prior echo performed on 09/21/2020 was reviewed for comparison. No significant changes noted since the previous study.    CT chest 09/21/2020:  Postoperative changes of aortic valve replacement. Ectasia of the ascending aorta measures up to 3.9 cm in the proximal arch grossly unchanged since 2013    ECG  06/07/2020:  Normal sinus rhythm at ventricular rate of 74 beats per minute.  Mild ST elevation in inferior leads.  Described before.     []  Reviewed/ordered radiology  []  Independently interpreted studies    [x]  Reviewed/ordered labs  [x]  Reviewed & summarized old records    [x]  Reviewed/ordered diag med test   []  Decided to get outside medical records      []  Obtained history from someone other than patient    ASSESSMENT/PLAN:  1. Aortic valve replacement in 2013  Normal valve function on most recent echocardiogram in June 2023. Repeat annually.  We discussed that some degeneration of the bioprosthetic valve can be expected 10 years after surgery so he will need close follow-up with serial echocardiograms  Continue aspirin    2. Bicuspid aortic valve  We discussed with there is an increased risk of having aortic aneurysm in patients with bicuspid aortic valve  Will need serial imaging. Most recent test in 08/2020. Stable from 2013. Will screen biannually  First-degree family members should be screened with echocardiograms    3. History of CVA  Residual leg weakness    4. Hyperlipidemia  On atorvastatin    5. Abnormal EKG  Patient has ST elevations in the inferior leads which have been present before.  I reviewed the records.  No further testing is necessary in this regard.  He had 2 negative coronary angiograms    6. Hypertension  Good control on Coreg, anlodipine and Losartan.     Follow up visit in 3 months    Please note, I spent a total of 20 minutes for this visit including chart review, test interpretation, history taking, physical examination, counseling, coordination of care and documentation.    Plan of care was discussed with the patient and family in detail. All questions answered.    Charlton Amor, MD  Advanced Heart Failure and Transplantation Cardiology  Pager [phone]

## 2021-10-11 DIAGNOSIS — I639 Cerebral infarction, unspecified: Secondary | ICD-10-CM

## 2021-10-11 DIAGNOSIS — I10 Essential (primary) hypertension: Secondary | ICD-10-CM

## 2021-11-09 NOTE — Progress Notes
PATIENT:  Andrew [name]  MRN:  [id number]  DOB:  09/28/1955  DATE OF SERVICE:  11/16/2021  PRIMARY CARE PROVIDER: Thayer Headings., MD    CHIEF COMPLAINT: Urinary Frequency     Subjective:     History of Present Illness:  Navraj Dreibelbis is a 66 y.o. 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 Denies any sudden urges or incontinence, leakage in between episodes of urge. These symptoms were previously attributed to BPH/OAB; he had been previously seen by an outside urologist, Dr. Lindie Spruce in June 2016. Previously on trospium 60mg  po qday.    Today, his wife confirms the history as above- his most bothersome complaint is urinary frequency. At night, he gets up to urinate at least 2x/nightly, previously 5x. In the daytime, he also urinates about 5x. This does not particularly disturb his sleep. Force of stream is good. He is no longer taking trospium, discontinued on advice from a previous urologist. Currently, he does not have any issues with urinary incontinence, although he sometimes has issues holding in urine as it is slower for him to get to the bathroom.    He is able to ambulate with a walker. Currently in a wheelchairdue to previous CVA in 2013 with residual left-sided weakness.  He can however walk with a walker    He is relatively active and rides a stationary bike for exercise, 5 days per week, 20 minutes daily.    PSA History:  02/28/19 - 0.4   04/28/20 - 0.4    05/12/20:   He has an unremarkable physical exam with respect to GU tract.  His most bothersome complaint is urinary frequency. With normal UF and PVR of 65cc today, he is emptying well, more likely related to OAB than enlargement of the prostate.     I explained to the patient and his wife that is current number of voids per day, about 5 per day and 2 at night, is within the range of normal for his age.  He does not appear to have obstructive voiding based on his uroflow.  If he were bothered by urgency we could consider treating him for overactive bladder and therefore.  I offered to start him on an OAB medication to help with his lower urinary tract symptoms, however the patient declines and prefers to maintain a course of watchful waiting which is certainly reasonable.    He will contact me if he has any new concerns, and otherwise return in 6 months for recheck.    11/10/2020:  The patient reports improvement in symptoms. He only gets up once a night now. Not bothered by an LUTS.    Had mild LUTS 6 months ago, which actually improved. No need for treament.  UA with trace blood today     Plan:  Formal UA  RTC in one year if UA negative  PSA next visit (0.4 03/2019)    Interval History 11/16/2021:   ***       MEDS  Outpatient Medications Prior to Visit   Medication Sig   ? ALLOPURINOL 100 mg tablet TAKE 2 TABLETS BY MOUTH EVERY DAY   ? AMLODIPINE 5 mg tablet TAKE 1 TABLET BY MOUTH EVERY DAY   ? Ascorbic Acid (VITAMIN C) 1000  MG tablet Take 1 tablet (1,000 mg total) by mouth daily.   ? aspirin 81 mg EC tablet Take 1 tablet (81 mg total) by mouth daily.   ? ATORVASTATIN 20 mg tablet TAKE 1 TABLET BY MOUTH EVERYDAY AT BEDTIME   ? CALCIUM PO Take by mouth.   ? CARVEDILOL 6.25 mg tablet TAKE 1 TABLET BY MOUTH TWO TIMES DAILY.   ? Cholecalciferol 50 mcg (2000 units) TABS Take 1 tablet (50 mcg total) by mouth daily.   ? FOLIC ACID PO Take 1,000 mcg by mouth daily.   ? LOSARTAN 25 mg tablet TAKE 1 TABLET BY MOUTH TWICE A DAY   ? Multiple Vitamins-Minerals (HCA SUPER THERAVITE-M PO) Take by mouth daily.     No facility-administered medications prior to visit.       PMH:  Past Medical History:   Diagnosis Date   ? CVA (cerebral vascular accident) (HCC/RAF)    ? H/O aortic valve replacement    ? Kidney stone    ? Urinary frequency         PSH:  Past Surgical History:   Procedure Laterality Date   ? AORTIC VALVE REPLACEMENT          Allergies:  No Known Allergies    FAM HX:   Family History   Problem Relation Age of Onset   ? No Known Problems Mother    ? Other (gastric ulcer) Father    ? Uterine cancer Sister    ? Stroke Brother    ? Kidney failure Brother    ? Colon cancer Neg Hx    ? Lung cancer Neg Hx    ? Prostate cancer Neg Hx        Social HX:  Social History     Socioeconomic History   ? Marital status: Married   Tobacco Use   ? Smoking status: Former     Packs/day: 1.00     Types: Cigarettes     Start date: 05/03/1975     Quit date: 05/02/1990     Years since quitting: 31.5   ? Smokeless tobacco: Never   Substance and Sexual Activity   ? Alcohol use: Not Currently     Comment: occasionally has red wine   ? Drug use: Not Currently       Review of Systems:  Review of Systems: A 14 point review of systems was negative.  Pertinent items are noted in HPI.    Objective:     There were no vitals filed for this visit.  Physical Exam:  Constitutional: Patient is oriented. Appears well-developed and well-nourished.  HENT:   Head: Normocephalic and atraumatic.   Neck: Normal range of motion. Neck supple.  Pulmonary/Chest: Effort normal  Abdominal: Soft., Nontender, no masses   Musculoskeletal: Normal range of motion.   Neurological:  alert and oriented.   Skin: Skin is warm and dry.   Psychiatric: normal mood and affect and behavior. Judgment and thought content normal.  GU: Phallus is uncircumcised without lesions.  Both testes are descended and normal. There are no inguinal hernias. ***     PREVIOUS PHYSICAL EXAM  Constitutional: Patient is oriented. Appears well-developed and well-nourished.  HENT:   Head: Normocephalic and atraumatic.   Neck: Normal range of motion. Neck supple.  Pulmonary/Chest: Effort normal  Abdominal: Soft., Nontender, no masses   Musculoskeletal: Normal range of motion. Neurological:  alert and oriented.   Skin: Skin is warm and dry.   Psychiatric: normal mood  and affect and behavior. Judgment and thought content normal.      Assessment:   Daisean Brodhead is a 66 y.o. 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      Plan/ Recommendation:   ***    I spent a total of 20 minutes today for this  visit which includes some or all of the following components: face-to-face time, non-face-to-face time, preparation to see the patient, review of tests, obtaining and or reviewing separately obtained history performing a medically  appropriate  examination  and/or evaluation, counseling and educating the  patient/family/caregiver,  ordering medications, tests, or procedures, referring and communicating with other healthcare professionals, documenting clinical information in the EHR and independently interpreting results and communicating results to patient.    Sissy Hoff, MD  Professor of Urology and Obstetrics and Gynecology  [employer] at [facility] SIGNATURE: I, Florina Ou, have assisted Dr. Sissy Hoff, MD with the documentation for Wilburt Finlay

## 2021-11-16 ENCOUNTER — Ambulatory Visit: Payer: Commercial Managed Care - HMO

## 2021-11-16 DIAGNOSIS — R350 Frequency of micturition: Secondary | ICD-10-CM

## 2021-12-05 MED ORDER — ALLOPURINOL 100 MG PO TABS
ORAL_TABLET | 0 refills
Start: 2021-12-05 — End: ?

## 2021-12-05 MED ORDER — CARVEDILOL 6.25 MG PO TABS
6.25 mg | ORAL_TABLET | Freq: Two times a day (BID) | ORAL | 2 refills
Start: 2021-12-05 — End: ?

## 2021-12-06 MED ORDER — ALLOPURINOL 100 MG PO TABS
ORAL_TABLET | 0 refills
Start: 2021-12-06 — End: ?

## 2021-12-06 MED ORDER — CARVEDILOL 6.25 MG PO TABS
6.25 mg | ORAL_TABLET | Freq: Two times a day (BID) | ORAL | 0 refills
Start: 2021-12-06 — End: ?

## 2021-12-13 ENCOUNTER — Ambulatory Visit: Payer: PRIVATE HEALTH INSURANCE

## 2021-12-13 DIAGNOSIS — Z760 Encounter for issue of repeat prescription: Secondary | ICD-10-CM

## 2021-12-13 MED ORDER — ALLOPURINOL 100 MG PO TABS
200 mg | ORAL_TABLET | Freq: Every day | ORAL | 0 refills
Start: 2021-12-13 — End: ?

## 2021-12-14 MED ORDER — ATORVASTATIN CALCIUM 20 MG PO TABS
20 mg | ORAL_TABLET | Freq: Every evening | ORAL | 0 refills | Status: AC
Start: 2021-12-14 — End: ?

## 2021-12-14 MED ORDER — CARVEDILOL 6.25 MG PO TABS
6.25 mg | ORAL_TABLET | Freq: Two times a day (BID) | ORAL | 0 refills | 30.00000 days | Status: AC
Start: 2021-12-14 — End: ?

## 2021-12-14 MED ORDER — LOSARTAN POTASSIUM 25 MG PO TABS
25 mg | ORAL_TABLET | Freq: Two times a day (BID) | ORAL | 0 refills | Status: AC
Start: 2021-12-14 — End: ?

## 2021-12-14 MED ORDER — ALLOPURINOL 100 MG PO TABS
200 mg | ORAL_TABLET | Freq: Every day | ORAL | 0 refills | Status: AC
Start: 2021-12-14 — End: ?

## 2021-12-14 NOTE — Progress Notes
[facility]     Name: Bogdan Vivona  MRN: [id number]  Date: 12/13/2021     Chief Complaint   Patient presents with   ? Medication Refill     Pt is here requesting refills.    Pt needs: Losartan, Carvedilol, Allopurinol, Atorvastatin        HPI:      Keithon Mccoin is a 66 y.o. male     Here for refills. Hasn't seen PCP in a while but does see cardiologist regularly, next appointment in a few weeks  Doing well without any complaints      Medications that the patient states to be currently taking   Medication Sig   ? ALLOPURINOL 100 mg tablet TAKE 2 TABLETS BY MOUTH EVERY DAY   ? AMLODIPINE 5 mg tablet TAKE 1 TABLET BY MOUTH EVERY DAY   ? Ascorbic Acid (VITAMIN C) 1000 MG tablet Take 1 tablet (1,000 mg total) by mouth daily.   ? aspirin 81 mg EC tablet Take 1 tablet (81 mg total) by mouth daily.   ? ATORVASTATIN 20 mg tablet TAKE 1 TABLET BY MOUTH EVERYDAY AT BEDTIME   ? CALCIUM PO Take by mouth.   ? CARVEDILOL 6.25 mg tablet TAKE 1 TABLET BY MOUTH TWO TIMES DAILY.   ? FOLIC ACID PO Take 1,000 mcg by mouth daily.   ? LOSARTAN 25 mg tablet TAKE 1 TABLET BY MOUTH TWICE A DAY   ? Multiple Vitamins-Minerals (HCA SUPER THERAVITE-M PO) Take by mouth daily.     No Known Allergies  Patient Active Problem List   Diagnosis   ? Abnormality of gait   ? Hx of medication noncompliance   ? Muscle weakness (generalized)   ? 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 Aortic stenosis   ? History of aortic valve replacement   ? Hypertension   ? Gout   ? Seborrheic dermatitis   ? Urinary frequency   ? CVA (cerebral vascular accident) (HCC/RAF)   ? H/O aortic valve replacement   ? Bicuspid aortic valve     Past Medical History:   Diagnosis Date   ? CVA (cerebral vascular accident) (HCC/RAF)    ? H/O aortic valve replacement    ? Kidney stone    ? Urinary frequency         Objective:     Physical Examination:  BP 103/70  ~ Pulse 73  ~ Temp 36.6 ?C (97.8 ?F) (Temporal)  ~ Resp 17  ~ SpO2 95%     General:   Alert,  in NAD  CV:         RRR, no murmur/ectopy                 Chest:    No tenderness, deformity  Lungs:   good air exchange, no wheezes or ronchi or adventitious sounds  Abd:       No CVAT, soft, + BS, no mass/megaly, no rebound or guarding, no tenderness        Lab at this visit:     Imaging:  No imaging has been resulted in the last 30 days     Assessment and Plan:     1. Encounter for medication refill          - refilled losartan, coreg, atorvastatin, allopurinol  - instructed to get labwork done  - instructed to re-establish care with PCP for further medication management    The above diagnosis, orders, and follow-up  were discussed with the patient. The patient had all questions answered satisfactorily and understands this recommended plan of care.    Noel Gerold, MD  12/13/2021   5:25 PM

## 2021-12-17 MED ORDER — ALLOPURINOL 100 MG PO TABS
200 mg | ORAL_TABLET | Freq: Every day | ORAL | 0 refills
Start: 2021-12-17 — End: ?

## 2021-12-19 MED ORDER — ALLOPURINOL 100 MG PO TABS
200 mg | ORAL_TABLET | Freq: Every day | ORAL | 0 refills
Start: 2021-12-19 — End: ?

## 2021-12-22 ENCOUNTER — Telehealth: Payer: Commercial Managed Care - HMO

## 2021-12-22 ENCOUNTER — Telehealth: Payer: PRIVATE HEALTH INSURANCE

## 2021-12-22 MED ORDER — AMLODIPINE BESYLATE 5 MG PO TABS
5 mg | ORAL_TABLET | Freq: Every day | ORAL | 3 refills | 60.00000 days
Start: 2021-12-22 — End: ?

## 2021-12-22 MED ORDER — LOSARTAN POTASSIUM 25 MG PO TABS
25 mg | ORAL_TABLET | Freq: Two times a day (BID) | ORAL | 0 refills
Start: 2021-12-22 — End: ?

## 2021-12-22 MED ORDER — ALLOPURINOL 100 MG PO TABS
200 mg | ORAL_TABLET | Freq: Every day | ORAL | 0 refills
Start: 2021-12-22 — End: ?

## 2021-12-22 MED ORDER — CARVEDILOL 6.25 MG PO TABS
6.25 mg | ORAL_TABLET | Freq: Two times a day (BID) | ORAL | 0 refills
Start: 2021-12-22 — End: ?

## 2021-12-22 MED ORDER — ATORVASTATIN CALCIUM 20 MG PO TABS
20 mg | ORAL_TABLET | Freq: Every evening | ORAL | 0 refills
Start: 2021-12-22 — End: ?

## 2021-12-22 NOTE — Telephone Encounter
Patient will need to schedule an appointment or go to [facility]. Left message with wife.

## 2021-12-22 NOTE — Telephone Encounter
Appointment Accommodation Request      Appointment Type: Well Physical     Reason for sooner request: Pt is requesting to have an annual physical.      Date/Time Requested (If any): any    Last seen by MD: 08/01/2020    Any Symptoms:  []  Yes  [x]  No       If yes, what symptoms are you experiencing:   o Duration of symptoms (how long):     Patient or caller was offered an appointment but declined.    Patient or caller was advised to seek emergency services if conditions are urgent or emergent.    Patient or caller has been notified of the turnaround time of 1-2 business (days).

## 2021-12-22 NOTE — Telephone Encounter
Refill Request    Spouse Eliezer Champagne wanted to schedule a Physical for her husband, she's aware he hasnt been seen since 04/2020 . I explained we cant gaurantee a refill at this time since he hasnt been seen within the past 6 months, she understood. I also sent another encounter for an appointment accomodation request for the physical. Thank you!      Verified patient was seen within the last 6 months. If not seen within 6 months, offered appointment.     Collected prescription information from patient.     Pended orders for provider to review and sign.     Pharmacy location was confirmed and class was set for pended orders.    Patient or caller has been notified of the turnaround time of 1-2 business day(s).

## 2021-12-27 ENCOUNTER — Telehealth: Payer: Commercial Managed Care - HMO

## 2021-12-29 ENCOUNTER — Ambulatory Visit: Payer: Commercial Managed Care - HMO | Attending: Cardiovascular Disease

## 2021-12-29 ENCOUNTER — Inpatient Hospital Stay: Payer: Commercial Managed Care - HMO | Attending: Cardiovascular Disease

## 2021-12-29 DIAGNOSIS — Z952 Presence of prosthetic heart valve: Secondary | ICD-10-CM

## 2021-12-29 DIAGNOSIS — I639 Cerebral infarction, unspecified: Secondary | ICD-10-CM

## 2021-12-29 DIAGNOSIS — I350 Nonrheumatic aortic (valve) stenosis: Secondary | ICD-10-CM

## 2021-12-29 MED ORDER — LOSARTAN POTASSIUM 25 MG PO TABS
25 mg | ORAL_TABLET | Freq: Two times a day (BID) | ORAL | 3 refills | Status: AC
Start: 2021-12-29 — End: ?

## 2021-12-29 MED ORDER — AMLODIPINE BESYLATE 5 MG PO TABS
5 mg | ORAL_TABLET | Freq: Every day | ORAL | 3 refills | Status: AC
Start: 2021-12-29 — End: ?

## 2021-12-29 MED ORDER — CARVEDILOL 6.25 MG PO TABS
6.25 mg | ORAL_TABLET | Freq: Two times a day (BID) | ORAL | 3 refills | Status: AC
Start: 2021-12-29 — End: ?

## 2021-12-29 NOTE — Progress Notes
Cardiology Clinic    PATIENT: Andrew [name]  MRN: [id number]  DOB: [birth year]  DATE OF SERVICE: 12/29/2021      REFERRING PHYSICIAN: Thayer Headings., MD    REASON FOR VISIT:  History of aortic valve replacement, routine follow-up    HISTORY OF PRESENT ILLNESS:  Andrew [name] is a delightful 66 y.o. gentleman with past medical history significant for bicuspid aortic valve status post aortic valve replacement in January of 2013 with perioperative CVA, chronic ST elevation in inferior leads, hypertension.  He was referred to our Cardiology Clinic by Dr. Tora Duck to establish care.  I reviewed his previous records in detail.    Andrew [name] has a history of bicuspid aortic valve.  In January 2013 he underwent aortic valve replacement with bioprosthetic valve at [facility].  Unfortunately, after the procedure he had CVA with residual left leg paresis.  In the perioperative period he was found to have ST elevations in the inferior leads for which he underwent coronary angiogram which was normal.  He had repeat angiogram in May of 2013 for similar findings.  He also has a history of hypertension for which he is on amlodipine and losartan.    Andrew [name] reports of doing well from cardiovascular standpoint.  He denies of having any chest pain, shortness of breath, orthopnea or paroxysmal nocturnal dyspnea.  Furthermore, he denies of having any dizziness, palpitations or lower extremity edema.  His blood pressure has been well controlled at home.    Andrew [name] is originally from [place]  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  He lives in [place].  He stopped smoking 40 years ago.  He drinks alcohol seldomly in social situations.  There is no history of illicit drug use.  His family history significant for brother who had stroke in his 73s.    Visit 09/21/2020  No change from cardiovascular standpoint.  He denies of having any chest pain, shortness of breath, orthopnea paroxysmal nocturnal dyspnea.  Furthermore, he denies of having any lightheadedness, lower extremity edema or palpitations.  Limited in ambulation due to left-sided hemiparesis as a consequence of stroke.  Had echocardiogram this morning.  Final read pending.  Normal valve function.  Good blood pressure control at home.    Visit 03/08/2021:  Doing well. Continues to do physical therapy. Feels stronger.   No chest pain, shortness of breath, palpitations or dizziness.   BP has been normal at home.  Limited ambulation due to hemiparesis, can walk with a walker.   Echocardiogram and CT chest stable.  Planning to leave for [place] in the next few weeks. Will stay for three months.     Visit 10/11/2021:  He continues to do well from cardiovascular perspective. More specifically he denies of having any chest pain, shortness of breath, orthopnea or paroxysmal nocturnal dyspnea. Furthermore, he denies of having any palpitations, LE edema or lightheadedness.   Completed physical theapy.   Limited ambulation due to hemiparesis, can walk with a walker.   BP has been normal  Stayed in [place] for 3 months    INTERVAL HISTORY:  Visit 12/29/2021:  He continues to do well from cardiovascular perspective. More specifically he denies of having any chest pain, shortness of breath, orthopnea or paroxysmal nocturnal dyspnea. Furthermore, he denies of having any palpitations, LE edema or lightheadedness.   Can walk a little bit a walker.   BP has been in the  100 range.      PAST MEDICAL HISTORY:  Patient Active Problem List   Diagnosis   ? Abnormality of gait   ? Hx of medication noncompliance   ? Muscle weakness (generalized)   ? 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 Aortic stenosis   ? History of aortic valve replacement   ? Hypertension   ? Gout   ? Seborrheic dermatitis   ? Urinary frequency   ? CVA (cerebral vascular accident) (HCC/RAF)   ? H/O aortic valve replacement   ? Bicuspid aortic valve PAST SURGICAL HISTORY:  Past Surgical History:   Procedure Laterality Date   ? AORTIC VALVE REPLACEMENT         ALLERGIES:  No Known Allergies    SOCIAL HISTORY:  Social History     Tobacco Use   ? Smoking status: Former     Packs/day: 1     Types: Cigarettes     Start date: 05/03/1975     Quit date: 05/02/1990     Years since quitting: 31.6   ? Smokeless tobacco: Never   Substance Use Topics   ? Alcohol use: Not Currently     Comment: occasionally has red wine   ? Drug use: Not Currently       FAMILY HISTORY:  Family History   Problem Relation Age of Onset   ? No Known Problems Mother    ? Other (gastric ulcer) Father    ? Uterine cancer Sister    ? Stroke Brother    ? Kidney failure Brother    ? Colon cancer Neg Hx    ? Lung cancer Neg Hx    ? Prostate cancer Neg Hx        REVIEW OF SYSTEMS:  All 14 systems were reviewed and were negative except as mentioned in the HPI    PHYSICAL EXAMINATION:  VITALS: BP 127/80  ~ Pulse 63  ~ Temp 36.4 ?C (97.5 ?F) (Forehead)  ~ Resp 16  ~ Ht 5' 6'' (1.676 m)  ~ Wt 189 lb (85.7 kg)  ~ SpO2 95% Comment: ra ~ BMI 30.51 kg/m?    General: Alert and oriented, not in acute distress.  HEENT: Normocephalic, atraumatic. Sclerae are anicteric.   Neck: Supple, no lymphadenopathy, JVP flat  Respiratory: Clear to auscultation bilaterally, no wheezing or rales.   Cardiac: RRR, S1/S2 normal, no murmurs, rubs or gallops. Peripheral pulses present.  Abdomen: Soft, non-tender, non-distended, normoactive bowel sounds.  Extremities: No cyanosis, clubbing or edema.    Integument: Warm, no petechiae. No acute rash.  Neurologic/Psychiatric: Alert and oriented. Affect is appropriate.       OUTPATIENT MEDICATIONS:  Current Outpatient Medications   Medication Sig   ? allopurinol 100 mg tablet Take 2 tablets (200 mg total) by mouth daily.   ? AMLODIPINE 5 mg tablet TAKE 1 TABLET BY MOUTH EVERY DAY   ? Ascorbic Acid (VITAMIN C) 1000 MG tablet Take 1 tablet (1,000 mg total) by mouth daily.   ? aspirin 81 mg EC tablet Take 1 tablet (81 mg total) by mouth daily.   ? atorvastatin 20 mg tablet Take 1 tablet (20 mg total) by mouth at bedtime.   ? CALCIUM PO Take by mouth.   ? carvedilol 6.25 mg tablet Take 1 tablet (6.25 mg total) by mouth two (2) times daily.   ? Cholecalciferol 50 mcg (2000 units) TABS Take 1 tablet (50 mcg total) by mouth daily.   ? FOLIC ACID  PO Take 1,000 mcg by mouth daily.   ? losartan 25 mg tablet Take 1 tablet (25 mg total) by mouth two (2) times daily.   ? Multiple Vitamins-Minerals (HCA SUPER THERAVITE-M PO) Take by mouth daily.     No current facility-administered medications for this visit.       LABORATORY:  Lab Results   Component Value Date    WBC 5.48 07/23/2020    HGB 15.9 07/23/2020    HCT 47.0 07/23/2020    MCV 86.9 07/23/2020    PLT 203 07/23/2020      Lab Results   Component Value Date    CREAT 0.76 12/23/2021    BUN 15 12/23/2021    NA 141 12/23/2021    K 4.4 12/23/2021    CL 103 12/23/2021    CO2 27 12/23/2021      Lab Results   Component Value Date    HGBA1C 6.3 (H) 12/23/2021      Lab Results   Component Value Date    ALT 24 12/23/2021    AST 23 12/23/2021    ALKPHOS 56 12/23/2021    BILITOT 0.9 12/23/2021    No results found for: ''TSH''   Lab Results   Component Value Date    CALCIUM 9.7 12/23/2021      Lab Results   Component Value Date    CHOL 119 12/23/2021    CHOLHDL 37 (L) 12/23/2021    CHOLDLCAL 67 12/23/2021    CHOLDLQ 61 03/11/2012    TRIGLY 74 12/23/2021       Lab Results  (Last 360 days)      09/23 0833    Result             46                 DIAGNOSTIC STUDIES:  ECHO 04/20/2019:  1. Left ventricle is normal in size with mild hypertrophy. Left ventricular   function is preserved and visually estimated to be 60-65%. There are no wall   motion abnormalities.   2. Normal biatrial?size.   3. Normal right ventricular size and systolic function.   4. Bioprosthetic valve (25-mm Carpentier-Edwards Magna Ease ; 1/13; Dr. Charyl Dancer)   is well seated in the aortic position. Peak velocity of 2.26 m/sec, mean   gradient of 11 mmHg, DI of 0.46, effective orifice area of 1.44 cm^2 (based   on LVOT diameter of 2cm)--consistent with normal prosthetic function.   5. Lack of an adequate TR signal precludes accurate estimation of right   ventricular systolic pressure. IVC diameter is = 2.1 cm with a > 50%   inspiratory collapse, suggestive of a right atrial pressure of 0-5 mmHg    ECHO 09/21/2020:   1. Normal LV size, mild septal hypertrophy, normal systolic function, normal LV diastolic function.   2. Left ventricular ejection fraction is approximately 60 to 65%.   3. Normal right ventricular size and normal systolic function.   4. A 25 mm Carpentier bioprosthetic valve is present in the aortic valve position. Echo findings are consistent with normal structure and function of the aortic prosthesis.   5. Normal pulmonary artery systolic pressure.   6. There are no prior echocardiograms available for comparison.    ECHO 09/06/2021:   1. Normal LV size, mild septal hypertrophy, normal wall motion and systolic function.   2. Left ventricular ejection fraction is approximately 60 to 65%.   3. Mild LV diastolic dysfunction (grade I, impaired relaxation).  4. Normal right ventricular size and grossly normal systolic function.   5. A 25 mm Carpentier bioprosthetic valve is present in the aortic valve position. It appears well seated. No paravalvular regurgitation. Peak aortic velocity is 1.9 m/sec and mean gradient is 8.0 mm Hg. By the continuity equation, the aortic valve area   is 1.74 cm? (Indexed AVA is 0.93 cm?/m?).   6. Upper normal aortic root (3.8 cm). Mildly enlarged mid ascending aorta (4.2 cm).   7. No significant valvular regurgitation.   8. Tricuspid regurgitant jet inadequate to assess pulmonary artery systolic pressure.   9. A prior echo performed on 09/21/2020 was reviewed for comparison. No significant changes noted since the previous study.    CT chest 09/21/2020:  Postoperative changes of aortic valve replacement. Ectasia of the ascending aorta measures up to 3.9 cm in the proximal arch grossly unchanged since 2013    ECG 06/07/2020:  Normal sinus rhythm at ventricular rate of 74 beats per minute.  Mild ST elevation in inferior leads.  Described before.     []  Reviewed/ordered radiology  []  Independently interpreted studies    [x]  Reviewed/ordered labs  [x]  Reviewed & summarized old records    [x]  Reviewed/ordered diag med test   []  Decided to get outside medical records      []  Obtained history from someone other than patient    ASSESSMENT/PLAN:  1. Aortic valve replacement in 2013  Normal valve function on most recent echocardiogram in June 2023. Repeat annually.   We discussed that some degeneration of the bioprosthetic valve can be expected 10 years after surgery so he will need close follow-up with serial echocardiograms  Continue aspirin    2. Bicuspid aortic valve  We discussed with there is an increased risk of having aortic aneurysm in patients with bicuspid aortic valve  Will need serial imaging. Most recent test in 08/2020. Stable from 2013. Will screen biannually. No change on echo in 08/2021  First-degree family members should be screened with echocardiograms    3. History of CVA  Residual leg weakness    4. Hyperlipidemia  On atorvastatin    5. Abnormal EKG  Patient has ST elevations in the inferior leads which have been present before.  I reviewed the records.  No further testing is necessary in this regard.  He had 2 negative coronary angiograms    6. Hypertension  Good control on Coreg, anlodipine and Losartan.     Follow up visit in 3 months    Please note, I spent a total of 20 minutes for this visit including chart review, test interpretation, history taking, physical examination, counseling, coordination of care and documentation.    Plan of care was discussed with the patient and family in detail. All questions answered.    Charlton Amor, MD  [facility] Trans[facility] Cardiology  Pager [phone]

## 2022-01-08 ENCOUNTER — Ambulatory Visit: Payer: PRIVATE HEALTH INSURANCE

## 2022-01-22 ENCOUNTER — Ambulatory Visit: Payer: Commercial Managed Care - HMO | Attending: Student in an Organized Health Care Education/Training Program

## 2022-01-25 ENCOUNTER — Ambulatory Visit: Payer: PRIVATE HEALTH INSURANCE

## 2022-01-25 DIAGNOSIS — I639 Cerebral infarction, unspecified: Secondary | ICD-10-CM

## 2022-01-25 DIAGNOSIS — Z1211 Encounter for screening for malignant neoplasm of colon: Secondary | ICD-10-CM

## 2022-01-25 DIAGNOSIS — I10 Essential (primary) hypertension: Secondary | ICD-10-CM

## 2022-01-25 DIAGNOSIS — Z1159 Encounter for screening for other viral diseases: Secondary | ICD-10-CM

## 2022-01-25 DIAGNOSIS — M810 Age-related osteoporosis without current pathological fracture: Secondary | ICD-10-CM

## 2022-01-25 DIAGNOSIS — M1000 Idiopathic gout, unspecified site: Secondary | ICD-10-CM

## 2022-01-25 DIAGNOSIS — Z23 Encounter for immunization: Secondary | ICD-10-CM

## 2022-01-25 MED ORDER — ALLOPURINOL 100 MG PO TABS
200 mg | ORAL_TABLET | Freq: Every day | ORAL | 3 refills | Status: AC
Start: 2022-01-25 — End: ?

## 2022-01-25 MED ORDER — ATORVASTATIN CALCIUM 20 MG PO TABS
20 mg | ORAL_TABLET | Freq: Every evening | ORAL | 3 refills | Status: AC
Start: 2022-01-25 — End: ?

## 2022-01-25 MED ORDER — PNEUMOCOCCAL 20-VAL CONJ VACC 0.5 ML IM SUSY
.5 mL | Freq: Once | INTRAMUSCULAR | 0 refills | Status: AC
Start: 2022-01-25 — End: ?

## 2022-01-25 MED ORDER — TETANUS-DIPHTH-ACELL PERTUSSIS 5-2.5-18.5 LF-MCG/0.5 IM SUSY
.5 mL | Freq: Once | INTRAMUSCULAR | 0 refills | Status: AC
Start: 2022-01-25 — End: ?

## 2022-01-25 MED ORDER — ZOSTER VAC RECOMB ADJUVANTED 50 MCG/0.5ML IM SUSR
1 refills | Status: AC
Start: 2022-01-25 — End: ?

## 2022-01-25 MED ORDER — ASPIRIN 81 MG PO TBEC
81 mg | ORAL_TABLET | Freq: Every day | ORAL | 2 refills | Status: AC
Start: 2022-01-25 — End: ?

## 2022-01-25 NOTE — H&P
Internal Medicine Outpatient History & Physical      Patient: Andrew [name], [id number]  DOB: Dec 29, 1955  Primary Care Provider: Remo Lipps, MD  Date of Service: 01/25/2022    Chief Concern:   Chief Complaint   Patient presents with   ? Establish Care     Provider transfer, will get flu vaccine after visit with MD     History obtained from: [x]  patient [x]  family member (wife) []  caregiver  []  chart review      History of Present Illness   Andrew [name] is a 66 y.o. male who presents to clinic to establish care and for med refills.    #Hx aortic valve replacement  - followed regularly by Dr. Burnard Bunting here. Per recent note:   He continues to do well from cardiovascular perspective. More specifically he denies of having any chest pain, shortness of breath, orthopnea or paroxysmal nocturnal dyspnea. Furthermore, he denies of having any palpitations, LE edema or lightheadedness.   Can walk a little bit a walker.   BP has been in the 100 range.       #Hx gout  - no recent episodes since 2013  - requests allopurinol refill     #Hx stroke in 2013  - taking asa 81 daily and atorva. Reports adherence to all meds    #Elevated a1c pre-diabetes  - diet includes white rice, sometimes brown rice, breads, pasta, no soda, no candies  - red wine 4-7 drinks per week  - exercise limited by poor balance and LE weakness residual from stroke. Used to walk in park but not so much lately, only 1-2x per week  - declines further PT at this time, doing exercises on his own regularly every week, states commitment to increase frequency    #Hx low bone density on DEXA at OSH ~10 years ago after presenting for broken rib  - started on Vit D and Ca but did not have any follow up imaging. No falls or other fractures since then     [facility]  - amenable to flu vaccine today. Declines pcv, shingles, tdap, or covid vaccines today because he doesn't want to get multiple shots in one day. Amenable to receive these vaccines at pharmacy    I reviewed these specialist notes that directly relate to the patient's acute and/or chronic medical problems with documentation of the salient findings, if AVW:UJWJX VUCICEVIC, MD in Medicine, Cardiovascular Disease on 12/29/2021.}  Review of Systems     Review of Systems   Constitutional: Negative for chills, fever, malaise/fatigue and weight loss.   HENT: Negative for hearing loss, sinus pain, sore throat and tinnitus.    Respiratory: Negative for cough, shortness of breath and wheezing.    Cardiovascular: Negative for chest pain, palpitations, orthopnea and PND.   Gastrointestinal: Negative for abdominal pain, blood in stool, diarrhea and vomiting.   Genitourinary: Negative for dysuria and flank pain.   Musculoskeletal: Negative for falls.   Neurological: Positive for focal weakness (residual from stroke 2013, unchanged recently). Negative for tremors, seizures, loss of consciousness and headaches.        (click to expand/collapse)    Past Medical History     Past Medical History:   Diagnosis Date   ? CVA (cerebral vascular accident) (HCC/RAF)    ? H/O aortic valve replacement    ? Kidney stone    ? Urinary frequency      Past Surgical History     Past Surgical History:   Procedure  Laterality Date   ? AORTIC VALVE REPLACEMENT        Family History     Family History   Problem Relation Age of Onset   ? No Known Problems Mother    ? Other (gastric ulcer) Father    ? Uterine cancer Sister    ? Stroke Brother    ? Kidney failure Brother    ? Colon cancer Neg Hx    ? Lung cancer Neg Hx    ? Prostate cancer Neg Hx      Social History     Social History     Socioeconomic History   ? Marital status: Married   Tobacco Use   ? Smoking status: Former     Packs/day: 1     Types: Cigarettes     Start date: 05/03/1975     Quit date: 05/02/1990     Years since quitting: 31.7   ? Smokeless tobacco: Never   Substance and Sexual Activity   ? Alcohol use: Not Currently     Comment: occasionally has red wine   ? Drug use: Not Currently Allergies   No Known Allergies  Immunizations     Immunization History   Administered Date(s) Administered   ? COVID-19, mRNA, (Moderna) 100 mcg/0.5 mL 06/10/2019, 07/08/2019, 02/04/2020   ? Hepatitis A, Adult 09/29/2012   ? Influenza vaccine IM quadrivalent (Afluria Quad) (PF) SYR (64 years of age and older) 11/18/2015, 11/20/2016, 12/20/2017, 12/09/2019   ? Tdap 05/25/2011   ? influenza vaccine IM cell culture quadrivalent (Flucelvax QUAD) (PF) SYR (40 months of age and older) 02/11/2019   ? influenza vaccine IM quadrivalent (Fluzone Quad) MDV (32 months of age and older) 01/06/2015   ? influenza vaccine IM quadrivalent high dose (Fluzone High Dose Quad) (PF) SYR (65 years of age and older) 01/15/2021, 01/25/2022   ? influenza, unspecified formulation 01/10/2008, 12/31/2009, 04/03/2011, 12/18/2011, 03/06/2014, 03/06/2014, 01/06/2015, 11/18/2015, 11/20/2016, 12/20/2017, 12/09/2019   ? pneumococcal polysaccharide vaccine 23-valent (Pneumovax) 05/25/2011     Health Maintenance     Health Maintenance   Topic Date Due   ? Hepatitis B Screening  Never done   ? Hepatitis C Screening  Never done   ? Shingles (Shingrix) Vaccine (1 of 2) Never done   ? COVID-19 Vaccine(Tracks primary and booster doses, not sup/immunocomp) (4 - Moderna series) 03/31/2020   ? Advance Directive  Never done   ? Pneumococcal Vaccine (2 - PCV) 09/11/2020   ? Annual Preventive Wellness Visit  05/02/2021   ? Tdap/Td Vaccine (2 - Td or Tdap) 05/24/2021   ? Colorectal Cancer Screening  08/18/2021   ? Prediabetes Screening (See hover text)  12/23/2024   ? Influenza Vaccine  Completed   ? Aspirin for Secondary Prevention  Completed   ? Statin prescribed for ASCVD Prevention or Treatment  Completed          Home Medications   The below medication list was reconciled at this visit.   Medications that the patient states to be currently taking   Medication Sig   ? amLODIPine 5 mg tablet Take 1 tablet (5 mg total) by mouth daily.   ? amoxicillin 500 mg capsule Prior to dentalappointment.   ? ascorbic acid 500 mg tablet Take 1 tablet (500 mg total) by mouth daily.   ? CALCIUM PO Take 500 mg by mouth two (2) times daily.   ? carvedilol 6.25 mg tablet Take 1 tablet (6.25 mg total) by mouth two (2) times  daily.   ? Cholecalciferol 50 mcg (2000 units) TABS Take 1 tablet (50 mcg total) by mouth daily.   ? FOLIC ACID PO Take 1,000 mcg by mouth daily.   ? losartan 25 mg tablet Take 1 tablet (25 mg total) by mouth two (2) times daily.   ? Multiple Vitamins-Minerals (HCA SUPER THERAVITE-M PO) Take by mouth daily.   ? [DISCONTINUED] allopurinol 100 mg tablet Take 2 tablets (200 mg total) by mouth daily.   ? [DISCONTINUED] aspirin 81 mg EC tablet Take 1 tablet (81 mg total) by mouth daily.   ? [DISCONTINUED] atorvastatin 20 mg tablet Take 1 tablet (20 mg total) by mouth at bedtime.       Physical Exam   Patient informed of chaperone program: Patient Declined       Vitals  BP 115/80 (BP Location: Left arm, Patient Position: Sitting, Cuff Size: Large)  ~ Pulse 65  ~ Temp 36.5 ?C (97.7 ?F) (Temporal)  ~ Resp 18  ~ Ht 5' 6'' (1.676 m)  ~ Wt 191 lb (86.6 kg)  ~ SpO2 96%  ~ BMI 30.83 kg/m?     Weight:   Wt Readings from Last 3 Encounters:   01/25/22 191 lb (86.6 kg)   12/29/21 189 lb (85.7 kg)   11/24/20 191 lb 8 oz (86.9 kg)      BMI Readings from Last 1 Encounters:   01/25/22 30.83 kg/m?     BP Trend:   BP Readings from Last 3 Encounters:   01/25/22 115/80   12/29/21 127/80   12/13/21 103/70         System Check if Normal Positive or additional negative findings   Constit  [x]  General appearance     Eyes  [x]  Conj/Lids [x]  Pupils  []  Fundi     ENMT  []  External ears/nose []  Otoscopy   []  Hearing []  Nasal mucosa   []  Lips/teeth/gums []  Oropharynx     Neck  []  Inspection/palpation []  Thyroid     Resp  [x]  Normal effort [x]  No Wheezing    [x]  Auscultation  [] No Crackles     CV  [x]  RRR   [x]  No  Murmurs  []  No Edema   [x]  JVP non-elevated    Normal pulses:   []  Abd aorta []  Femoral []  Pedal     Breast  []  Inspection []  Palpation     GI  [x]  No abd masses    [x]  No tenderness   [x]  Liver/spleen []  Rectal     GU  M: []  Scrotum []  Penis []  Prostate   F:  []  External []  Bladder []  Cervix         []  Uterus    []  Adnexa      Lymph  [x]  Neck []  Axillae []  Groin     MS Specify site examined:    []  Inspect/palp []  ROM   []  Stability []  Strength/tone         Skin  []  Inspection []  Palpation     Neuro  [x]  CN2-12 intact    [x]  Oriented x 3   []  Motor strength intact  []  Sensory intact to light touch      []  Patellar DTR  []  Brachial DTR       []  Sensation   []  Normal Gait  Motor strength 5/5 in b/l UE, 5/5 RLE, residual weakness 4/5 in LLE consistent with previous per pt's report  No pronator drift. No tremor, no asterixis.  Psych  [x]  Insight/judgement     [x]  Mood/affect           Laboratory Data/ Imaging      (click to expand/collapse)    Lab Studies:  CBC:   Results for orders placed or performed in visit on 07/23/20   CBC   Result Value Ref Range    White Blood Cell Count 5.48 4.16 - 9.95 x10E3/uL    Red Blood Cell Count 5.41 4.41 - 5.95 x10E6/uL    Hemoglobin 15.9 13.5 - 17.1 g/dL    Hematocrit 86.5 78.4 - 52.0 %    Mean Corpuscular Volume 86.9 79.3 - 98.6 fL    Mean Corpuscular Hemoglobin 29.4 26.4 - 33.4 pg    MCH Concentration 33.8 31.5 - 35.5 g/dL    Red Cell Distribution Width-SD 40.8 36.9 - 48.3 fL    Red Cell Distribution Width-CV 13.1 11.1 - 15.5 %    Platelet Count, Auto 203 143 - 398 x10E3/uL    Mean Platelet Volume 11.2 9.3 - 13.0 fL    Nucleated RBC%, automated 0.0 No Ref. Range %    Absolute Nucleated RBC Count 0.00 0.00 - 0.00 x10E3/uL    Neutrophil Abs (Prelim) 2.63 See Absolute Neut Ct. x10E3/uL   Differential, Automated   Result Value Ref Range    Neutrophil Percent, Auto 48.0 No Ref. Range %    Lymphocyte Percent, Auto 37.8 No Ref. Range %    Monocyte Percent, Auto 10.6 No Ref. Range %    Eosinophil Percent, Auto 2.9 No Ref. Range %    Basophil Percent, Auto 0.5 No Ref. Range % Immature Granulocytes% 0.2 No Reference Range %    Absolute Neut Count 2.63 1.80 - 6.90 x10E3/uL    Absolute Lymphocyte Count 2.07 1.30 - 3.40 x10E3/uL    Absolute Mono Count 0.58 0.20 - 0.80 x10E3/uL    Absolute Eos Count 0.16 0.00 - 0.50 x10E3/uL    Absolute Baso Count 0.03 0.00 - 0.10 x10E3/uL    Absolute Immature Gran Count 0.01 0.00 - 0.04 x10E3/uL   CBC & Plt & Diff    Narrative    The following orders were created for panel order CBC & Plt & Diff.  Procedure                               Abnormality         Status                     ---------                               -----------         ------                     ONG[[id number]]                                              Final result               Differential, Automated[[id number]]                          Final result  Please view results for these tests on the individual orders.       CMP:   Results for orders placed or performed in visit on 12/23/21   Comprehensive Metabolic Panel   Result Value Ref Range    Sodium 141 135 - 146 mmol/L    Potassium 4.4 3.6 - 5.3 mmol/L    Chloride 103 96 - 106 mmol/L    Total CO2 27 20 - 30 mmol/L    Anion Gap 11 8 - 19 mmol/L    Glucose 104 (H) 65 - 99 mg/dL    Creatinine 0.27 2.53 - 1.30 mg/dL    Estimated GFR >66 See GFR Additional Information mL/min/1.62m2    GFR Additional Information See Comment     Urea Nitrogen 15 7 - 22 mg/dL    Calcium 9.7 8.6 - 44.0 mg/dL    Total Protein 7.9 6.1 - 8.2 g/dL    Albumin 4.7 3.9 - 5.0 g/dL    Bilirubin,Total 0.9 0.1 - 1.2 mg/dL    Alkaline Phosphatase 56 37 - 113 U/L    Aspartate Aminotransferase 23 13 - 62 U/L    Alanine Aminotransferase 24 8 - 70 U/L     Last LFTs:     Hgb A1C:   Lab Results   Component Value Date/Time    HGBA1C 6.3 (H) 12/23/2021 08:33 AM     Lipids:   Results for orders placed or performed in visit on 12/23/21   Lipid Panel   Result Value Ref Range    Cholesterol 119 See Comment mg/dL    Cholesterol,LDL,Calc 67 <100 mg/dL Cholesterol, HDL 37 (L) >40 mg/dL    Triglycerides 74 <347 mg/dL    Non-HDL,Chol,Calc 82 <130 mg/dL      BNP:   Lab Results   Component Value Date/Time    BNP 46 12/23/2021 08:33 AM        Imaging & Procedures     Imaging Studies:   Imaging in last 90d: Echo adult transthoracic complete    Result Date: 01/01/2022  TRANSTHORACIC ECHOCARDIOGRAM REPORT  Patient Name:   Andrew [name] Date of Exam:     12/29/2021 Medical Rec #:  [id number]        Patient Type:     OP Accession #:    [id number]       [facility] Date of Birth:  22-Jul-1955      Height:           62.0 in Patient Age:    66 years       Weight:           191.5 lb Patient Gender: M              BSA:              1.88 m?                                BP:               103/70  Diagnosis:  Z95.2-S/P AVR (aortic valve replacement) Indication: Hx of Bicuspid aortic valve replacement 2013, HTN, CVA, chronic ST             elevation in inferior leads Referring Provider: [id number] [facility] VUCICEVIC Sonographer 1:      Boone Master RDCS  Report prepared/completed by Boone Master RDCS on 12/29/2021 at 10:09:38 AM Other Study Information: Good image  quality.  --------------------------------------------------------------------------  2D AND M-MODE MEASUREMENTS (normal ranges within parentheses): Left Ventricle:               Aorta/Left Atrium: IVSd (2D):      1.4 cm        Aortic Root, d (2D): 3.8 cm LVPWd (2D):     1.4 cm        Right Ventricle: LVIDd (2D):     4.2 cm        RV major, A4C       7.0 cm LVIDs (2D):     3.2 cm        RV basal minor, A4C 3.5 cm LV FS (2D):     24.5 % (>25%) LV SYSTOLIC FUNCTION BY 2D PLANIMETRY (MOD): EF-A4C View: 56 % EF-A2C View: 67 % EF-Biplane: 60 % WMSI         1.0 LV Vol A4C:      EDV 63 ml ESV 28 ml LV Vol A2C:      EDV 72 ml ESV 24 ml LV Vol: Biplane: EDV 68 ml ESV 27 ml LV DIASTOLIC FUNCTION: MV Peak E: 77 cm/s  e' medial   4.1 MV Peak A: 101 cm/s e' lateral  4.4 E/A Ratio: 0.8      e' average  4.2 Decel Time: 264 msec  Right Ventricle: TAPSE:           2.2 cm RA Volumes: RA VOL A4C: 41 ml RA Vol A4C index: 22 ml/m? RA area: LA Volumes: LA Vol A4C: 41 ml LA Vol A4C index: 22 ml/m? LA area: Aortic Valve: AoV Max Vel: 2.34 m/s AoV Peak PG: 22 mmHg AoV Mean PG: 12 mmHg LVOT Vmax: 0.78 m/s LVOT VTI: 22 cm AoV VTI:       54 cm                                     LVOT Diameter: 2.1 cm AoV Area, Vmax: 1.16 cm? AoV Area, VTI: 1.39 cm?                          AoV area index 0.74 cm?/m? Dimensionless index 0.40 Tricuspid Valve and PA/RV Systolic Pressure: TR Max Velocity: 2.2 m/s RA Pressure: 8 mmHg RVSP/PASP: 28 mmHg Pulmonic Valve: PV Max Velocity: 0.6 m/s  PV Max PG: 1 mmHg PV Mean PG: PV AT:           127 msec Aorta: Ao Asc: 3.6 cm Systemic Veins: IVC Diameter: 1.61 cm -------------------------------------------------------------------------- PHYSICIAN INTERPRETATION: LEFT VENTRICLE: Normal left ventricular size. Mild concentric left ventricular hypertrophy. Normal systolic function. Visually estimated left ventricular ejection fraction 60-65%. Indeterminate diastolic function. LV Wall Motion: All segments are normal. RIGHT VENTRICLE: Normal right ventricular size and normal systolic function TAPSE 2.2 cm, (DTI 10.3 cm/s). LEFT ATRIUM: Normal left atrial size. Pulmonary vein spectral Doppler profile consistent with normal left atrial pressure. RIGHT ATRIUM: Normal right atrial size. Right atrial pressure is estimated at 8 mmHg. MITRAL VALVE: Thickened mitral valve leaflets. Mitral valve annular calcification is present. No evidence of mitral valve regurgitation. TRICUSPID VALVE: The tricuspid valve is not well seen. Trace tricuspid valve regurgitation. The tricuspid regurgitant velocity is 2.2 m/s, and with an assumed right atrial pressure of 8 mmHg, the estimated right ventricular systolic pressure is normal at  28 mmHg. AORTIC VALVE: A 25 mm Carpentier  bioprosthetic valve is present in the aortic valve position. Echo findings are consistent with normal structure and function, allowing for the limitations of transthoracic echo techniques of the aortic prosthesis. LVOT stroke volume index 40 ml/m? consistent with normal flow. Normal prosthetic valve function. The aortic valve area is 1.39 cm? (Indexed AVA is 0.74 cm?/m?). The aortic valve maximum velocity is 2.34 m/s. The peak gradient across the aortic valve is 21.9 mmHg. The mean gradient is 12.0 mmHg. No evidence of aortic valve regurgitation. No evidence of paravalvular aortic valve regurgitation. PULMONIC VALVE: The pulmonic valve was not well visualized. The pulmonary regurgitation jet was not well seen. Normal mean pulmonary artery pressure by RVOT spectral signal. AORTA: Normal descending aorta, normal aortic arch and normal mid ascending aorta. SYSTEMIC VEINS: The inferior vena cava is normal in size and exhibits less than 50% respiratory change. PERICARDIUM: There is no evidence of pericardial effusion.       1. Normal LV size, mild concentric left ventricular hypertrophy, normal systolic function, indeterminate diastolic function.  2. Left ventricular ejection fraction is approximately 60 to 65%.  3. Normal right ventricular size and normal systolic function.  4. Status post Carpentier bioprosthetic placement in the aortic valve position without regurgitation. Echo findings are consistent with normal structure and function of the aortic prosthesis. The aortic valve area is 1.39 cm? (Indexed AVA is 0.74 cm?/m?).  5. Trace tricuspid regurgitation and normal pulmonary artery systolic pressure.  6. A prior echo performed on 09/06/2021 was reviewed for comparison. The mean AV gradient has increased from 8 mmHg to 12 mmHg. Aortic valve was 1.74 cm2 in the previous study. [id number] Karolee Stamps MD Electronically signed by [id number] Karolee Stamps MD on 01/01/2022 at 3:54:45 PM  cc: [id number] [facility] VUCICEVIC   Final            Assessment & Problem-Based Plan   Andrew [name] is a 66 y.o. male who presents to clinic to establish care.      Diagnoses and all orders for this visit:    Hypertension, unspecified type       -     On losartan, amlodipine, carvedilol. Refilled by cardiologist. Cont current regimen.    Need for immunization against influenza  -     Cancel: Influenza vaccine IM;   PF  -     Influenza Vaccine Quadrivalent High Dose (Fluzone) IM PF (65+)    Cerebrovascular accident (CVA), unspecified mechanism (HCC/RAF)  -     atorvastatin 20 mg tablet; Take 1 tablet (20 mg total) by mouth at bedtime.  -     aspirin 81 mg EC tablet; Take 1 tablet (81 mg total) by mouth daily.    Idiopathic gout, unspecified chronicity, unspecified site  -     allopurinol 100 mg tablet; Take 2 tablets (200 mg total) by mouth daily.    Screening for colon cancer  -     Fecal Immunochemical Test  (For colorectal cancer screening); Future    Vaccine for diphtheria-tetanus-pertussis, combined  -     Tdap vaccine (BOOSTRIX) 5-2.5-18.5 LF-MCG/0.5 injection (Age 43 years and older); Inject 0.5 mLs into the muscle once May substitute with Adacel if Boostrix is not available. for 1 dose.    Need for Streptococcus pneumoniae vaccination  -     pneumococcal 20-valent vaccine (PREVNAR 20) 0.5 ml injection; Inject 0.5 mLs into the muscle once for 1 dose.    Need for vaccination for zoster  -  zoster vac recomb adjuvanted (SHINGRIX) 50 mcg injection; Administer 50 MCG IM now and repeat in 2 - 6 months.    Need for hepatitis C screening test  -     HCV Antibody Screen with Reflex to Quantitative PCR/Genotype; Future    Need for hepatitis B screening test  -     HBS Antigen; Future    Age-related osteoporosis without current pathological fracture  -     DXA lumbar spine+hip; Future; Expected date: 01/25/2022  -     Assess need for alendronate - will make f/u appt if persistent osteoporosis on DEXA    Andrew [name] has no Advance Directive or POLST in [facility] and has the capacity to identify a surrogate decision maker. Next steps in advance care planning-: No time to address advance care planning at this visit.     RTC:   Return in about 6 months (around 07/27/2022).    The above diagnosis, plan, orders, and follow-up were discussed with the patient. See After-Visit Summary for additional information and counseling materials provided to the patient.    This patient was evaluated and treated under the supervision of the attending, Dr. August Luz.     Author:  Remo Lipps, MD  [facility] Internal Medicine Resident  01/25/2022

## 2022-01-25 NOTE — Patient Instructions
A note from your doctor:    Thank you for choosing [facility] for your care.    Our plan, as discussed today:    Have your labs drawn today. I will message you about the results.  Receive your flu vaccine today. [facility] your other vaccines, including PCV20 (pneumococcal), shinges/VZV vaccine, TDAP  [facility] to [facility] your prescribed [facility]  Please schedule your bone scan as you exit the clinic today.  Please perform your fecal immunochemical test that you received from our nurses today.     Your return appointment should be scheduled for Return in about 6 months (around 07/27/2022).    Follow-up: Checkout staff will schedule your follow-up visit.   If you prefer to schedule at a later time, you can call our Call Center at [phone] or request an appointment online through [facility].    ----------------------------------------------------------------------------------------------------------------------    Frequently asked questions:    1. How do I return for laboratory tests?  [place] [employer] [facility] [employer], [address]):   Monday to Friday (6:00 am to 7:00 pm)   Weekends and holidays (7:00 am to 3:30 pm)  [facility] [phone] [address], [address]):  Monday to Friday (8:00 am to 6:00 pm)  On request, we can send to outside labs (e.g. [facility] or [facility])  For other [place] labs, see: [email or web address]    2. How do I get my test results from the visit?  If you sign up for [facility] online ([email or web address]), we will release test results online with comments once your test results are available, usually within 1 week. Some specialized test results may take several weeks.  If you are not signed up for [facility] when your results become available, we will send your test results by letter within 1-2 weeks of your visit.    If another doctor orders a test for you, it is best to speak first with that doctor directly about the result.    3. How can I reach my doctor after the visit?  For non-emergency contact, you may reach your doctor two ways:  Online: send non-urgent medical questions through the [facility] website ([email or web address]). These are usually returned within 2 business days.  Call the [facility] Internal Medicine Call Center at [phone] during business hours to leave a message (or schedule a return appointment).    4. How do I get after-hours care?    For serious and life-threatening concerns you should call 911.  Weekdays: Call during business hours to request an urgent appointment: [phone].   Evenings/weekends: Our colleagues offer access to after-hours care at our walk-in clinic:    [facility]  [address] [place] [address], [place] [address]   [phone]  Monday - Friday: 5:00pm to 9:00pm  Saturday - Sunday and Holidays: 9:00 am to 5:00 pm  [email or web address]    The [facility] has additional urgent care sites, see locations at: [email or web address]

## 2022-01-25 NOTE — Progress Notes
The patient was seen and discussed with housestaff in clinic on the date of service.  I agree with the findings and plan of care as documented in the resident's note above.Patient had not eaten breakfast and had vasovagal symptoms with blood draw. He did not lose consciousness or hit his head. We had him lay down, eat crackers/juice. Repeat BP after 10 minutes was reviewed with nursing staff and deemed adequate for d/c with SBP above 110.    Signed:  Cheral Marker. Audrie Lia 07/02/2014 9:43 AM

## 2022-01-25 NOTE — Progress Notes
Phelan Schadt has no Advance Directive or POLST in [place] and has the capacity to identify a surrogate decision maker. {Next steps in advance care planning-:40161}  Adonys Wildes has no Advance Directive or POLST in [place] and has the capacity to identify a surrogate decision maker. {Next steps in advance care planning-:40161}

## 2022-03-09 MED ORDER — ATORVASTATIN CALCIUM 20 MG PO TABS
20 mg | ORAL_TABLET | Freq: Every evening | ORAL | 3 refills | Status: AC
Start: 2022-03-09 — End: ?

## 2022-03-29 ENCOUNTER — Ambulatory Visit: Payer: Commercial Managed Care - HMO

## 2022-03-29 DIAGNOSIS — M810 Age-related osteoporosis without current pathological fracture: Secondary | ICD-10-CM

## 2022-04-05 ENCOUNTER — Telehealth: Payer: Commercial Managed Care - HMO

## 2022-04-05 DIAGNOSIS — M810 Age-related osteoporosis without current pathological fracture: Secondary | ICD-10-CM

## 2022-04-05 NOTE — Telephone Encounter
Called patient to inform him of his recent DEXA scan results. [name] wife answered and gave phone to patient. I informed patient that his recent DEXA scan showed that he had very low bone mass (osteoporosis), and that we would recommend medical therapy to treat his osteoporosis. Patient agreed and was amenable to the plan of care. I informed the patient that he should come in for an appointment and in-person evaluation prior to starting medical therapy for osteoporosis, and that our schedulers will reach out to him to make an appointment in the coming weeks. I further informed patient that we can check his serum testosterone in lab to determine whether low testosterone may be contributing to his osteoporosis. Patient was agreeable to coming in for an appointment for evaluation and commencement of medical therapy.

## 2022-04-11 ENCOUNTER — Telehealth: Payer: Commercial Managed Care - HMO

## 2022-04-12 NOTE — Telephone Encounter
Call Back Request      Reason for call back: [name] wife called and stated she would like a call back to discuss commencement of medical therapy after reviewing dexa scan results with MD.  Please advise.     Any Symptoms:  []  Yes  [x]  No      If yes, what symptoms are you experiencing:    Duration of symptoms (how long):    Have you taken medication for symptoms (OTC or Rx):      If call was taken outside of clinic hours:    [] Patient or caller has been notified that this message was sent outside of normal clinic hours.     [] Patient or caller has been warm transferred to the physician's answering service. If applicable, patient or caller informed to please call us back if symptoms progress.  Patient or caller has been notified of the turnaround time of 1-2 business day(s).

## 2022-04-19 NOTE — Telephone Encounter
The patient was seen and discussed with housestaff in clinic on the date of service.  I agree with the findings and plan of care as documented in the resident's note above.    Signed:  Devonia Farro P. Hale Chalfin 07/02/2014 9:43 AM

## 2022-05-04 ENCOUNTER — Ambulatory Visit: Payer: PRIVATE HEALTH INSURANCE | Attending: Cardiovascular Disease

## 2022-05-04 DIAGNOSIS — I10 Essential (primary) hypertension: Secondary | ICD-10-CM

## 2022-05-04 DIAGNOSIS — I639 Cerebral infarction, unspecified: Secondary | ICD-10-CM

## 2022-05-04 NOTE — Patient Instructions
Please do labs one week prior to our next appointment.

## 2022-05-04 NOTE — Progress Notes
Cardiology Clinic    PATIENT: Andrew [name]  MRN: [id number]  DOB: 03/31/1956  DATE OF SERVICE: 05/04/2022      REFERRING PHYSICIAN: Remo Lipps, MD    REASON FOR VISIT:  History of aortic valve replacement, routine follow-up    HISTORY OF PRESENT ILLNESS:  [name] is a delightful 67 y.o. gentleman with past medical history significant for bicuspid aortic valve status post aortic valve replacement in January of 2013 with perioperative CVA, chronic ST elevation in inferior leads, hypertension.  He was referred to our Cardiology Clinic by Dr. Tora Duck to establish care.  I reviewed his previous records in detail.    [name] has a history of bicuspid aortic valve.  In January 2013 he underwent aortic valve replacement with bioprosthetic valve at [facility].  Unfortunately, after the procedure he had CVA with residual left leg paresis.  In the perioperative period he was found to have ST elevations in the inferior leads for which he underwent coronary angiogram which was normal.  He had repeat angiogram in May of 2013 for similar findings.  He also has a history of hypertension for which he is on amlodipine and losartan.    [name] reports of doing well from cardiovascular standpoint.  He denies of having any chest pain, shortness of breath, orthopnea or paroxysmal nocturnal dyspnea.  Furthermore, he denies of having any dizziness, palpitations or lower extremity edema.  His blood pressure has been well controlled at home.    [name] is originally from [place]  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  He lives in [place].  He stopped smoking 40 years ago.  He drinks alcohol seldomly in social situations.  There is no history of illicit drug use.  His family history significant for brother who had stroke in his 38s.    Visit 09/21/2020  No change from cardiovascular standpoint.  He denies of having any chest pain, shortness of breath, orthopnea paroxysmal nocturnal dyspnea.  Furthermore, he denies of having any lightheadedness, lower extremity edema or palpitations.  Limited in ambulation due to left-sided hemiparesis as a consequence of stroke.  Had echocardiogram this morning.  Final read pending.  Normal valve function.  Good blood pressure control at home.    Visit 03/08/2021:  Doing well. Continues to do physical therapy. Feels stronger.   No chest pain, shortness of breath, palpitations or dizziness.   BP has been normal at home.  Limited ambulation due to hemiparesis, can walk with a walker.   Echocardiogram and CT chest stable.  Planning to leave for [place] in the next few weeks. Will stay for three months.     Visit 10/11/2021:  He continues to do well from cardiovascular perspective. More specifically he denies of having any chest pain, shortness of breath, orthopnea or paroxysmal nocturnal dyspnea. Furthermore, he denies of having any palpitations, LE edema or lightheadedness.   Completed physical theapy.   Limited ambulation due to hemiparesis, can walk with a walker.   BP has been normal  Stayed in [place] for 3 months    Visit 12/29/2021:  He continues to do well from cardiovascular perspective. More specifically he denies of having any chest pain, shortness of breath, orthopnea or paroxysmal nocturnal dyspnea. Furthermore, he denies of having any palpitations, LE edema or lightheadedness.   Can walk a little bit a walker.   BP has been in the 100 range.  INTERVAL HISTORY:  Visit 05/04/2022:  Doing well. No chest pain, shortness of breath, palpitations or dizziness.   Can walk a little bit with a walker.   BP has been normal at home. Checking twice a day.   Still taking carvedilol, losartan and amlodipine.     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) Aortic stenosis    History of aortic valve replacement    Hypertension    Gout    Seborrheic dermatitis    Urinary frequency    CVA (cerebral vascular accident) (HCC/RAF)    H/O aortic valve replacement    Bicuspid aortic valve       PAST SURGICAL HISTORY:  Past Surgical History:   Procedure Laterality Date    AORTIC VALVE REPLACEMENT         ALLERGIES:  No Known Allergies    SOCIAL HISTORY:  Social History     Tobacco Use    Smoking status: Former     Packs/day: 1     Types: Cigarettes     Start date: 05/03/1975     Quit date: 05/02/1990     Years since quitting: 32.0    Smokeless tobacco: Never   Substance Use Topics    Alcohol use: Not Currently     Comment: occasionally has red wine    Drug use: Not Currently       FAMILY HISTORY:  Family History   Problem Relation Age of Onset    No Known Problems Mother     Other (gastric ulcer) Father     Uterine cancer Sister     Stroke Brother     Kidney failure Brother     Colon cancer Neg Hx     Lung cancer Neg Hx     Prostate cancer Neg Hx        REVIEW OF SYSTEMS:  All 14 systems were reviewed and were negative except as mentioned in the HPI    PHYSICAL EXAMINATION:  VITALS: BP 129/90  ~ Pulse 74  ~ Temp 36.4 ?C (97.5 ?F) (Forehead)  ~ Resp 16  ~ Ht 5' 6'' (1.676 m)  ~ Wt 194 lb 1.6 oz (88 kg)  ~ SpO2 95% Comment: RA ~ BMI 31.33 kg/m?    General: Alert and oriented, not in acute distress.  HEENT: Normocephalic, atraumatic. Sclerae are anicteric.   Neck: Supple, no lymphadenopathy, JVP flat  Respiratory: Clear to auscultation bilaterally, no wheezing or rales.   Cardiac: RRR, S1/S2 normal, no murmurs, rubs or gallops.   Abdomen: Soft, non-tender, non-distended, normoactive bowel sounds.  Extremities: No cyanosis, clubbing or edema.    Integument: Warm, no petechiae. No acute rash.  Neurologic/Psychiatric: Alert and oriented. Affect is appropriate.       OUTPATIENT MEDICATIONS:  Current Outpatient Medications   Medication Sig    allopurinol 100 mg tablet Take 2 tablets (200 mg total) by mouth daily.    amLODIPine 5 mg tablet Take 1 tablet (5 mg total) by mouth daily.    amoxicillin 500 mg capsule Prior to dentalappointment.    ascorbic acid 500 mg tablet Take 1 tablet (500 mg total) by mouth daily.    aspirin 81 mg EC tablet Take 1 tablet (81 mg total) by mouth daily.    ATORVASTATIN 20 mg tablet TAKE 1 TABLET  BY MOUTH EVERYDAY AT BEDTIME    CALCIUM PO Take 500 mg by mouth two (2) times daily.    carvedilol 6.25 mg tablet Take 1 tablet (6.25 mg total) by mouth two (2) times daily.    Cholecalciferol 50 mcg (2000 units) TABS Take 1 tablet (50 mcg total) by mouth daily.    FOLIC ACID PO Take 1,000 mcg by mouth daily.    losartan 25 mg tablet Take 1 tablet (25 mg total) by mouth two (2) times daily.    Multiple Vitamins-Minerals (HCA SUPER THERAVITE-M PO) Take by mouth daily.    zoster vac recomb adjuvanted (SHINGRIX) 50 mcg injection Administer 50 MCG IM now and repeat in 2 - 6 months.     No current facility-administered medications for this visit.       LABORATORY:  Lab Results   Component Value Date    WBC 5.48 07/23/2020    HGB 15.9 07/23/2020    HCT 47.0 07/23/2020    MCV 86.9 07/23/2020    PLT 203 07/23/2020      Lab Results   Component Value Date    CREAT 0.76 12/23/2021    BUN 15 12/23/2021    NA 141 12/23/2021    K 4.4 12/23/2021    CL 103 12/23/2021    CO2 27 12/23/2021      Lab Results   Component Value Date    HGBA1C 6.3 (H) 12/23/2021      Lab Results   Component Value Date    ALT 24 12/23/2021    AST 23 12/23/2021    ALKPHOS 56 12/23/2021    BILITOT 0.9 12/23/2021    No results found for: ''TSH''   Lab Results   Component Value Date    CALCIUM 9.7 12/23/2021      Lab Results   Component Value Date    CHOL 119 12/23/2021    CHOLHDL 37 (L) 12/23/2021    CHOLDLCAL 67 12/23/2021    CHOLDLQ 61 03/11/2012    TRIGLY 74 12/23/2021       Lab Results  (Last 360 days)        12/23/21 0833    Result             46                      DIAGNOSTIC STUDIES:  ECHO 04/20/2019:  1. Left ventricle is normal in size with mild hypertrophy. Left ventricular   function is preserved and visually estimated to be 60-65%. There are no wall   motion abnormalities.   2. Normal biatrial size.   3. Normal right ventricular size and systolic function.   4. Bioprosthetic valve (25-mm Carpentier-Edwards Magna Ease ; 1/13; Dr. Charyl Dancer)   is well seated in the aortic position. Peak velocity of 2.26 m/sec, mean   gradient of 11 mmHg, DI of 0.46, effective orifice area of 1.44 cm^2 (based   on LVOT diameter of 2cm)--consistent with normal prosthetic function.   5. Lack of an adequate TR signal precludes accurate estimation of right   ventricular systolic pressure. IVC diameter is = 2.1 cm with a > 50%   inspiratory collapse, suggestive of a right atrial pressure of 0-5 mmHg    ECHO 09/21/2020:   1. Normal LV size, mild septal hypertrophy, normal systolic function, normal LV diastolic function.   2. Left ventricular ejection fraction is approximately 60 to 65%.   3. Normal right ventricular size and normal systolic  function.   4. A 25 mm Carpentier bioprosthetic valve is present in the aortic valve position. Echo findings are consistent with normal structure and function of the aortic prosthesis.   5. Normal pulmonary artery systolic pressure.   6. There are no prior echocardiograms available for comparison.    ECHO 09/06/2021:   1. Normal LV size, mild septal hypertrophy, normal wall motion and systolic function.   2. Left ventricular ejection fraction is approximately 60 to 65%.   3. Mild LV diastolic dysfunction (grade I, impaired relaxation).   4. Normal right ventricular size and grossly normal systolic function.   5. A 25 mm Carpentier bioprosthetic valve is present in the aortic valve position. It appears well seated. No paravalvular regurgitation. Peak aortic velocity is 1.9 m/sec and mean gradient is 8.0 mm Hg. By the continuity equation, the aortic valve area  is 1.74 cm? (Indexed AVA is 0.93 cm?/m?).   6. Upper normal aortic root (3.8 cm). Mildly enlarged mid ascending aorta (4.2 cm).   7. No significant valvular regurgitation.   8. Tricuspid regurgitant jet inadequate to assess pulmonary artery systolic pressure.   9. A prior echo performed on 09/21/2020 was reviewed for comparison. No significant changes noted since the previous study.    ECHO 12/29/2021:   1. Normal LV size, mild concentric left ventricular hypertrophy, normal systolic function, indeterminate diastolic function.   2. Left ventricular ejection fraction is approximately 60 to 65%.   3. Normal right ventricular size and normal systolic function.   4. Status post Carpentier bioprosthetic placement in the aortic valve position without regurgitation. Echo findings are consistent with normal structure and function of the aortic prosthesis. The aortic valve area is 1.39 cm? (Indexed AVA is 0.74 cm?/m?).   5. Trace tricuspid regurgitation and normal pulmonary artery systolic pressure.   6. A prior echo performed on 09/06/2021 was reviewed for comparison. The mean AV gradient has increased from 8 mmHg to 12 mmHg. Aortic valve was 1.74 cm2 in the previous study.    CT chest 09/21/2020:  Postoperative changes of aortic valve replacement. Ectasia of the ascending aorta measures up to 3.9 cm in the proximal arch grossly unchanged since 2013    ECG 06/07/2020:  Normal sinus rhythm at ventricular rate of 74 beats per minute.  Mild ST elevation in inferior leads.  Described before.     []  Reviewed/ordered radiology  []  Independently interpreted studies    [x]  Reviewed/ordered labs  [x]  Reviewed & summarized old records    [x]  Reviewed/ordered diag med test   []  Decided to get outside medical records      []  Obtained history from someone other than patient    ASSESSMENT/PLAN:  1. Aortic valve replacement in 2013  Normal valve function on most recent echocardiogram in September 2023. Repeat with our next visit.   We discussed that some degeneration of the bioprosthetic valve can be expected 10 years after surgery so he will need close follow-up with serial echocardiograms  Continue aspirin    2. Bicuspid aortic valve  We discussed with there is an increased risk of having aortic aneurysm in patients with bicuspid aortic valve  Will need serial imaging. Most recent test in 08/2020. Stable from 2013. No change on echo in 08/2021. Repeat with our next visit.   First-degree family members should be screened with echocardiograms    3. History of CVA  Residual leg weakness    4. Hyperlipidemia  On atorvastatin. Repeat lipid panel with our next  visit.     5. Abnormal EKG  Patient has ST elevations in the inferior leads which have been present before.  I reviewed the records.  No further testing is necessary in this regard.  He had 2 negative coronary angiograms    6. Hypertension  Good control on Coreg, anlodipine and Losartan.     Follow up visit in 3 months    Please note, I spent a total of 30 minutes for this visit including chart review, test interpretation, history taking, physical examination, counseling, coordination of care and documentation.    Plan of care was discussed with the patient and family in detail. All questions answered.    Charlton Amor, MD  Advanced Heart Failure and Transplantation Cardiology  Pager [phone]

## 2022-07-02 ENCOUNTER — Telehealth: Payer: PRIVATE HEALTH INSURANCE

## 2022-07-02 NOTE — Telephone Encounter
Orders Request    What is being requested? (Tests, Labs, Imaging, etc.): labs    Reason for the request: patient had previous stroke per spouse. Requesting to check A1C, also to check cholesterol and check kidney. Also whatever labs doctor thinks is necessary and will be able to discuss at upcoming appt on 07/20/22    Where does the patient want to be seen?  [facility]    If outside [place], what is the fax number to the facility?      Has the patient seen their doctor for this matter? yes    Last office visit: 01/25/22    Patient or caller was offered an appointment but declined.    Patient or caller has been notified of the turnaround time of 1-2 business day(s).   Patient spouse called requesting for patient to complete labs prior to appt on 07/20/22. Once orders are placed please let patient and patient spouse know so they may provide sample.

## 2022-07-12 NOTE — Telephone Encounter
Call Back Request      Reason for call back: patient wife would like a status update on request below. Would like for blood work to be done this weekend but needs an order. Requesting if another md can look at request and place. Please advise.    Any Symptoms:  []  Yes  [x]  No      If yes, what symptoms are you experiencing:    Duration of symptoms (how long):    Have you taken medication for symptoms (OTC or Rx):      If call was taken outside of clinic hours:    [] Patient or caller has been notified that this message was sent outside of normal clinic hours.     [] Patient or caller has been warm transferred to the physician's answering service. If applicable, patient or caller informed to please call us back if symptoms progress.  Patient or caller has been notified of the turnaround time of 1-2 business day(s).

## 2022-07-12 NOTE — Telephone Encounter
Call Back Request    Reason for call back: Patient spouse called to follow up on previous request, Please call on preferred line once orders are placed so patient may provide sample. Patient spouse requesting to escalate.     Any Symptoms:  []  Yes  [x]  No      If yes, what symptoms are you experiencing:    Duration of symptoms (how long):    Have you taken medication for symptoms (OTC or Rx):      If call was taken outside of clinic hours:    [] Patient or caller has been notified that this message was sent outside of normal clinic hours.     [] Patient or caller has been warm transferred to the physician's answering service. If applicable, patient or caller informed to please call us back if symptoms progress.  Patient or caller has been notified of the turnaround time of 1-2 business day(s).

## 2022-07-13 NOTE — Telephone Encounter
Call Back Request      Reason for call back: per pt wife, she is calling to check on those lab orders that she would like MD to enter before the pt physical appt next Friday 4/19.  Please advise    Any Symptoms:  []  Yes  [x]  No      If yes, what symptoms are you experiencing:    Duration of symptoms (how long):    Have you taken medication for symptoms (OTC or Rx):      If call was taken outside of clinic hours:    [] Patient or caller has been notified that this message was sent outside of normal clinic hours.     [] Patient or caller has been warm transferred to the physician's answering service. If applicable, patient or caller informed to please call us back if symptoms progress.  Patient or caller has been notified of the turnaround time of 1-2 business day(s).

## 2022-07-20 ENCOUNTER — Ambulatory Visit: Payer: PRIVATE HEALTH INSURANCE

## 2022-07-20 DIAGNOSIS — Z136 Encounter for screening for cardiovascular disorders: Secondary | ICD-10-CM

## 2022-07-20 DIAGNOSIS — R7303 Prediabetes: Secondary | ICD-10-CM

## 2022-07-20 DIAGNOSIS — Z23 Encounter for immunization: Secondary | ICD-10-CM

## 2022-07-20 DIAGNOSIS — M810 Age-related osteoporosis without current pathological fracture: Secondary | ICD-10-CM

## 2022-07-20 NOTE — Patient Instructions
A note from your doctor:    Thank you for choosing [facility] for your care.    Our plan, as discussed today:    Please go to the lab and have your labs drawn. I will message you about the results  Please START taking alendronate daily for your osteoporosis.     Your return appointment should be scheduled for Return in about 6 months (around 01/19/2023).       ----------------------------------------------------------------------------------------------------------------------    Frequently asked questions:    1. How do I follow through on the plan from my office visit?    1. How do I return for laboratory tests?  Main laboratory ([address], [address]):   Monday to Friday (6:00 am to 7:00 pm)   Weekends and holidays (7:00 am to 3:30 pm)  [facility] [phone] [address])  Monday to Friday (8:00 am to 6:00 pm).  On request, we can send to outside labs (e.g. [facility] or [facility])    Follow-up: Checkout staff will schedule your follow-up visit. If you prefer to schedule at a later time, you can call our Call Center at [phone] or request an appointment online through [facility].     2. How do I get my test results from the visit?    If you sign up for [facility] online ([email or web address]), we will release test results online with comments once your test results are available, usually within 1 week. Some specialized test results may take several weeks.  If you are not signed up for [facility] when your results become available, we will send your test results by letter within 1-2 weeks of your visit.  .  If another doctor orders a test for you, it is best to speak first with that doctor directly about the result.  Please contact our office if you have not received a result in the expected time frame.  Please make sure your address and phone number are up-to-date in our system when you check in. We use these to contact you about important health information, including test results.    3. How can I reach my doctor after the visit?    For non-emergency contact, you may reach your doctor two ways:    Online: send non-urgent medical questions through the [facility] website ([email or web address]). These are usually returned within 2 business days.  Call the [facility] Call Center at [phone] during business hours to leave a message (or schedule a return appointment).    4. How do I get after-hours care?    For serious and life-threatening concerns you should call 911.    Weekdays: Call during business hours to request an urgent appointment: [phone].     Evenings/weekends: Our colleagues offer access to after-hours care at our walk-in clinic:    [facility]  [address], [place]   [phone]  Monday - Friday: 10:00 am to 9:00 pm  Saturday - Sunday: 9:00 am to 5:00 pm  Laboratory and x-ray services available    [facility] has additional urgent care sites, see locations at: [email or web address]      Emergency Services    [facility]   Emergency Department  [address]  [place], [place] [address]  Hospital Information: [phone]  Emergency Department: [phone]    [facility]   Emergency Department  [address]  [place]  [place], [place] [address]  Hospital Information: [phone]  Emergency Department: [phone]

## 2022-07-20 NOTE — Progress Notes
Internal Medicine Progress Note      Patient: Andrew [name], [id number]  DOB: 24-Jun-1955  Primary Care Provider: Remo Lipps, MD  Date of Service: 07/20/2022    Chief Concern:   Chief Complaint   Patient presents with    Hypertension     Follow up    Travel Consult     Per patient, he is travelling to the [place] and needs a letter/certificate that he is taking medications on chart to bring with him in case immigration/customs asks about his prescriptions     History obtained from: [x]  patient []  family member []  caregiver  []  chart review    History of Present Illness   Jayon Matton is a 67 y.o. male who presents to clinic for   Chief Complaint   Patient presents with    Hypertension     Follow up    Travel Consult     Per patient, he is travelling to the [place] and needs a letter/certificate that he is taking medications on chart to bring with him in case immigration/customs asks about his prescriptions     Last visit with me 01/25/22:  #Hx aortic valve replacement  - followed regularly by Dr. Burnard Bunting here. Per recent note:   He continues to do well from cardiovascular perspective. More specifically he denies of having any chest pain, shortness of breath, orthopnea or paroxysmal nocturnal dyspnea. Furthermore, he denies of having any palpitations, LE edema or lightheadedness.   Can walk a little bit a walker.   BP has been in the 100 range.        #Hx gout  - no recent episodes since 2013  - requests allopurinol refill      #Hx stroke in 2013  - taking asa 81 daily and atorva. Reports adherence to all meds     #Elevated a1c pre-diabetes  - diet includes white rice, sometimes brown rice, breads, pasta, no soda, no candies  - red wine 4-7 drinks per week  - exercise limited by poor balance and LE weakness residual from stroke. Used to walk in park but not so much lately, only 1-2x per week  - declines further PT at this time, doing exercises on his own regularly every week, states commitment to increase frequency     #Hx low bone density on DEXA at OSH ~10 years ago after presenting for broken rib  - started on Vit D and Ca but did not have any follow up imaging. No falls or other fractures since then      [facility]  - amenable to flu vaccine today. Declines pcv, shingles, tdap, or covid vaccines today because he doesn't want to get multiple shots in one day. Amenable to receive these vaccines at pharmacy      Interval:   - DEXA 10/26 with osteoporosis of hip and L fem  - appt w cardiology 05/04/22: Cont ASA, atorva, coreg, amlo, losartan; serial imaging for bicuspid aortic valve, EKG with stable STE      Today, 07/20/22:   #Osteoporosis  - No recent falls  - Fractures: None recent. In 2016 broke rib. Carrying heavy pot. No more rib pain. Was told it would heal by itself. Got DEXA at the time showing osteoporosis and was started on fosamax, but stopped after ~1 year for unclear reason. Pt believes they were told they don't need to be taking it anymore.   - Seen dentist earlier this month, was told he had cavities and he needs fillings.  Planning to get filling in June after trip to [place] and will follow up here to start fosamax after that     #HCM   - Amenable to Pneumococcal vax and Shingrix today, as well as [place] abd for aneurysm.   - Smoked previously but quit 25 years ago, started age 51.     #PreDM  - Eating brown rice, adjusting diet, trying to be more active. No soda, moderate sweets and fruit intake. Wants to revisit medical therapy at next visit but try lifestyle mod for now     Trip to [place] soon. Does not need refills.    I reviewed these specialist notes that directly relate to the patient's acute and/or chronic medical problems with documentation of the salient findings, if KGM:WNUUV VUCICEVIC, MD in Medicine, Cardiovascular Disease on 05/04/2022.}  Review of Systems     Review of Systems   Constitutional:  Negative for chills, fever and weight loss.   HENT:  Negative for congestion and sore throat.    Respiratory:  Negative for cough and shortness of breath.    Cardiovascular:  Negative for chest pain and palpitations.   Gastrointestinal:  Negative for abdominal pain, nausea and vomiting.   Genitourinary:  Negative for dysuria and flank pain.   Musculoskeletal:  Negative for falls and neck pain.   Skin:  Negative for rash.   Neurological:  Negative for dizziness, loss of consciousness and headaches.        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)    Aortic stenosis    History of aortic valve replacement    Hypertension    Gout    Seborrheic dermatitis    Urinary frequency    CVA (cerebral vascular accident) (HCC/RAF)    H/O aortic valve replacement    Bicuspid aortic valve    Age related osteoporosis    Prediabetes         Health Maintenance   Topic Date Due    Shingles (Shingrix) Vaccine (1 of 2) Never done    Advance Directive  Never done    AAA Screening  Never done    Preventive Wellness Visit  05/02/2021    COVID-19 Vaccine(Tracks primary and booster doses, not sup/immunocomp) (4 - 2023-24 season) 12/01/2021    Colorectal Cancer Screening  07/16/2023    Prediabetes Screening (See hover text)  07/14/2025    Tdap/Td Vaccine (3 - Td or Tdap) 07/19/2032    Hepatitis B Screening  Completed    Influenza Vaccine  Completed    Pneumococcal Vaccine  Completed    Hepatitis C Screening  Completed    Anti-platelet therapy  Completed    Statin prescribed for ASCVD Prevention or Treatment  Completed         Home Medications   The below medication list was reconciled at this visit.   Medications that the patient states to be currently taking   Medication Sig    allopurinol 100 mg tablet Take 2 tablets (200 mg total) by mouth daily.    amLODIPine 5 mg tablet Take 1 tablet (5 mg total) by mouth daily. amoxicillin 500 mg capsule Prior to dentalappointment.    ascorbic acid 500 mg tablet Take 1 tablet (500 mg total) by mouth  daily.    aspirin 81 mg EC tablet Take 1 tablet (81 mg total) by mouth daily.    ATORVASTATIN 20 mg tablet TAKE 1 TABLET BY MOUTH EVERYDAY AT BEDTIME    CALCIUM PO Take 500 mg by mouth two (2) times daily.    carvedilol 6.25 mg tablet Take 1 tablet (6.25 mg total) by mouth two (2) times daily.    Cholecalciferol 50 mcg (2000 units) TABS Take 1 tablet (50 mcg total) by mouth daily.    FOLIC ACID PO Take 1,000 mcg by mouth daily.    losartan 25 mg tablet Take 1 tablet (25 mg total) by mouth two (2) times daily.    Multiple Vitamins-Minerals (HCA SUPER THERAVITE-M PO) Take by mouth daily.       Allergy  No Known Allergies    Physical Exam     Patient informed of chaperone program: Patient/Legal Guardian Accepted    Vitals  BP 116/77 (BP Location: Right arm, Patient Position: Sitting, Cuff Size: Regular)  ~ Pulse 67  ~ Temp 36.6 ?C (97.8 ?F) (Temporal)  ~ Resp 18  ~ Ht 1.676 m (5' 6'')  ~ Wt 85.7 kg (189 lb)  ~ SpO2 96%  ~ BMI 30.51 kg/m?   Weight:   Wt Readings from Last 3 Encounters:   07/20/22 85.7 kg (189 lb)   05/04/22 88 kg (194 lb 1.6 oz)   01/25/22 86.6 kg (191 lb)      BMI Readings from Last 1 Encounters:   07/20/22 30.51 kg/m?     BP Trend:   BP Readings from Last 3 Encounters:   07/20/22 116/77   05/04/22 129/90   01/25/22 115/80         System Check if Normal Positive or additional negative findings   Constit  [x]  General appearance     Eyes  [x]  Conj/Lids [x]  Pupils  []  Fundi     ENMT  [x]  External ears/nose []  Otoscopy   [x]  Hearing []  Nasal mucosa   [x]  Lips/teeth/gums []  Oropharynx     Neck  [x]  Inspection/palpation []  Thyroid     Resp  [x]  Normal effort [x]  No Wheezing    []  Auscultation  [] No Crackles     CV  [x]  RRR   []  No  Murmurs  []  No Edema   [x]  JVP non-elevated    Normal pulses:   []  Abd aorta []  Femoral  []  Pedal     Breast  []  Inspection []  Palpation breast and axilla GI  [x]  No abd masses    [x]  No tenderness   [x]  No Liver/spleen enlargement    []  Rectal-no masses     GU  M: []  Scrotum []  Penis []  Prostate   F:  []  External []  Bladder []  Cervix         []  Uterus    []  Adnexa      Lymph  []  Neck []  Axillae []  Groin     MS Specify site examined:    [x]  Inspect/palp []  ROM   []  Stability []  Strength/tone         Skin  [x]  Inspection []  Palpation     Neuro  [x]  CN2-12 intact    [x]  Oriented x 3   []  Motor strength    []  Sensory intact to light touch   []  Patellar DTR  []  Brachial DTR          [x]  Normal Gait     Psych  [x]  Insight/judgement     [  x] Mood/affect           Laboratory Data/ Imaging      (click to expand/collapse)    Lab Studies:  CBC:   Results for orders placed or performed in visit on 07/23/20   CBC   Result Value Ref Range    White Blood Cell Count 5.48 4.16 - 9.95 x10E3/uL    Red Blood Cell Count 5.41 4.41 - 5.95 x10E6/uL    Hemoglobin 15.9 13.5 - 17.1 g/dL    Hematocrit 14.7 82.9 - 52.0 %    Mean Corpuscular Volume 86.9 79.3 - 98.6 fL    Mean Corpuscular Hemoglobin 29.4 26.4 - 33.4 pg    MCH Concentration 33.8 31.5 - 35.5 g/dL    Red Cell Distribution Width-SD 40.8 36.9 - 48.3 fL    Red Cell Distribution Width-CV 13.1 11.1 - 15.5 %    Platelet Count, Auto 203 143 - 398 x10E3/uL    Mean Platelet Volume 11.2 9.3 - 13.0 fL    Nucleated RBC%, automated 0.0 No Ref. Range %    Absolute Nucleated RBC Count 0.00 0.00 - 0.00 x10E3/uL    Neutrophil Abs (Prelim) 2.63 See Absolute Neut Ct. x10E3/uL   Differential, Automated   Result Value Ref Range    Neutrophil Percent, Auto 48.0 No Ref. Range %    Lymphocyte Percent, Auto 37.8 No Ref. Range %    Monocyte Percent, Auto 10.6 No Ref. Range %    Eosinophil Percent, Auto 2.9 No Ref. Range %    Basophil Percent, Auto 0.5 No Ref. Range %    Immature Granulocytes% 0.2 No Reference Range %    Absolute Neut Count 2.63 1.80 - 6.90 x10E3/uL    Absolute Lymphocyte Count 2.07 1.30 - 3.40 x10E3/uL    Absolute Mono Count 0.58 0.20 - 0.80 x10E3/uL    Absolute Eos Count 0.16 0.00 - 0.50 x10E3/uL    Absolute Baso Count 0.03 0.00 - 0.10 x10E3/uL    Absolute Immature Gran Count 0.01 0.00 - 0.04 x10E3/uL   CBC & Plt & Diff    Narrative    The following orders were created for panel order CBC & Plt & Diff.  Procedure                               Abnormality         Status                     ---------                               -----------         ------                     FAO[[id number]]                                              Final result               Differential, Automated[[id number]]                          Final result  Please view results for these tests on the individual orders.       CMP:   Results for orders placed or performed in visit on 07/15/22   Comprehensive Metabolic Panel   Result Value Ref Range    Sodium 142 135 - 146 mmol/L    Potassium 3.8 3.6 - 5.3 mmol/L    Chloride 103 96 - 106 mmol/L    Total CO2 26 20 - 30 mmol/L    Anion Gap 13 8 - 19 mmol/L    Glucose 103 (H) 65 - 99 mg/dL    Creatinine 1.61 0.96 - 1.30 mg/dL    Estimated GFR >04 See GFR Additional Information mL/min/1.63m2    GFR Additional Information See Comment     Urea Nitrogen 14 7 - 22 mg/dL    Calcium 9.1 8.6 - 54.0 mg/dL    Total Protein 7.5 6.1 - 8.2 g/dL    Albumin 4.4 3.9 - 5.0 g/dL    Bilirubin,Total 0.5 0.1 - 1.2 mg/dL    Alkaline Phosphatase 56 37 - 113 U/L    Aspartate Aminotransferase 23 13 - 62 U/L    Alanine Aminotransferase 24 8 - 70 U/L     Last LFTs:     BMP:    Hgb A1C:   Lab Results   Component Value Date/Time    HGBA1C 6.1 (H) 07/15/2022 08:22 AM     Lipids:   Results for orders placed or performed in visit on 07/15/22   Lipid Panel   Result Value Ref Range    Cholesterol 131 See Comment mg/dL    Cholesterol,LDL,Calc 73 <100 mg/dL    Cholesterol, HDL 41 >40 mg/dL    Triglycerides 86 <981 mg/dL    Non-HDL,Chol,Calc 90 <130 mg/dL      Vit-D: No results found for: ''[facility]''    Imaging & Procedures     Imaging Studies:   Last DXA: Results for orders placed or performed in visit on 03/29/22   DXA lumbar spine+hip    Narrative    =================================================================                         Bone Density Report                         =================================================================    Name:          ELMOR, KOST  Patient ID:    [id number]  Age:           59  Sex:           Male  Ethnicity:     Asian  Date of Birth: 1955-08-05     -----------------------------------------------------------------    Indication: Prior fracture;    Referring Provider: August Luz, DAVID P.    Study: Bone densitometry was performed.    Exam Date: March 29, 2022    Accession number: [id number]      Bone Density:  -----------------------------------------------------------------  Region                   BMD    T-score  Z-score   Classification  -----------------------------------------------------------------  AP Spine(L1-L4)          0.884   -1.9     -1.1       Osteopenia  Femoral Neck (Left)      0.548   -2.8     -1.7       Osteoporosis  Total  Hip (Left)         0.654   -2.5     -2.0       Osteoporosis  -----------------------------------------------------------------    World Health Organization criteria for BMD impression   classify patients as:   Normal (T-score at or above -1.0),   Osteopenia (T-score between -1.0 and -2.5), or   Osteoporosis (T-score at or below -2.5).     10-year Fracture Risk:  -----------------------------------------------------------------  FRAX not reported because:    Some T-score for Spine Total or Hip Total or Femoral Neck at   or below -2.5     Prior hip or vertebral fracture  -----------------------------------------------------------------        Impression: The patient has established osteoporosis, based on   the Left Femoral Neck T-score and the existence of a prior   fracture. The patient has risk factors, including: previous   fracture.       Reported by: Quin Hoop on 03/29/2022 4:37:00 PM.         Assessment & Problem-Based Plan   Mr. Clancy Mullarkey is a 67 y.o. male  presenting for follow up.       Diagnoses and all orders for this visit:    Age related osteoporosis, unspecified pathological fracture presence        -     Told pt to follow up with dentist and after cavity fillings to make another appointment with [place] to start alendronate therapy   -     Vitamin D 25-OH; COMMON, deficiency status; Future    Screening for abdominal aortic aneurysm  -     [place] abd aorta screening non-vascular; Future    Vaccine for diphtheria-tetanus-pertussis, combined  -     Tdap vaccine >= to 67yo IM    Need for Streptococcus pneumoniae vaccination  -     Pneumococcal conjugate vaccine 20-valent (Prevnar 20) - Preferred if available.        RTC:   Return in about 6 months (around 01/19/2023).    The above diagnosis, plan, orders, and follow-up were discussed with the patient. See After-Visit Summary for additional information and counseling materials provided to the patient.    This patient was evaluated and treated under the supervision of the attending, Dr. Park Breed.     Author:  Remo Lipps, MD  [facility] Internal Medicine Resident  07/20/2022  Moe Graca has no Advance Directive or POLST in [facility] and has the capacity to identify a surrogate decision maker. Next steps in advance care planning-: No time to address advance care planning at this visit.

## 2022-07-23 NOTE — Progress Notes
I have seen and examined the patient. I agree with the findings, assessment, and plan as documented in the resident's note. The plan of care, diagnoses, orders, and follow up were discussed with the patient. Questions related to this recommended plan of care were answered.      , MD   Clinical Instructor  Internal Medicine & Primary Care   Internal Medicine Suites ~ [address], [address]  ~ [place], [place] [address] ~ [phone]

## 2022-08-19 MED ORDER — ALLOPURINOL 100 MG PO TABS
200 mg | ORAL_TABLET | Freq: Every day | ORAL | 2 refills
Start: 2022-08-19 — End: ?

## 2022-08-20 MED ORDER — ALLOPURINOL 100 MG PO TABS
200 mg | ORAL_TABLET | Freq: Every day | ORAL | 2 refills | Status: AC
Start: 2022-08-20 — End: ?

## 2022-09-07 ENCOUNTER — Ambulatory Visit: Payer: PRIVATE HEALTH INSURANCE | Attending: Cardiovascular Disease

## 2022-09-07 ENCOUNTER — Inpatient Hospital Stay: Payer: PRIVATE HEALTH INSURANCE | Attending: Cardiovascular Disease

## 2022-09-07 DIAGNOSIS — I639 Cerebral infarction, unspecified: Secondary | ICD-10-CM

## 2022-09-07 DIAGNOSIS — Z952 Presence of prosthetic heart valve: Secondary | ICD-10-CM

## 2022-09-07 DIAGNOSIS — I10 Essential (primary) hypertension: Secondary | ICD-10-CM

## 2022-09-07 DIAGNOSIS — Q231 Congenital insufficiency of aortic valve: Secondary | ICD-10-CM

## 2022-09-07 NOTE — Progress Notes
Cardiology Clinic    PATIENT: Andrew [name]  MRN: [id number]  DOB: 05-21-1955  DATE OF SERVICE: 09/07/2022      REFERRING PHYSICIAN: Yolande Jolly, MD    REASON FOR VISIT:  History of aortic valve replacement, routine follow-up    HISTORY OF PRESENT ILLNESS:  Andrew [name] is a delightful 67 y.o. gentleman with past medical history significant for bicuspid aortic valve status post aortic valve replacement in January of 2013 with perioperative CVA, chronic ST elevation in inferior leads, hypertension.  He was referred to our Cardiology Clinic by Dr. Tora Duck to establish care.  I reviewed his previous records in detail.    Andrew [name] has a history of bicuspid aortic valve.  In January 2013 he underwent aortic valve replacement with bioprosthetic valve at [facility].  Unfortunately, after the procedure he had CVA with residual left leg paresis.  In the perioperative period he was found to have ST elevations in the inferior leads for which he underwent coronary angiogram which was normal.  He had repeat angiogram in May of 2013 for similar findings.  He also has a history of hypertension for which he is on amlodipine and losartan.    Andrew [name] reports of doing well from cardiovascular standpoint.  He denies of having any chest pain, shortness of breath, orthopnea or paroxysmal nocturnal dyspnea.  Furthermore, he denies of having any dizziness, palpitations or lower extremity edema.  His blood pressure has been well controlled at home.    Andrew [name] is originally from [place]  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  He lives in [place].  He stopped smoking 40 years ago.  He drinks alcohol seldomly in social situations.  There is no history of illicit drug use.  His family history significant for brother who had stroke in his 35s.    Visit 09/21/2020  No change from cardiovascular standpoint.  He denies of having any chest pain, shortness of breath, orthopnea paroxysmal nocturnal dyspnea.  Furthermore, he denies of having any lightheadedness, lower extremity edema or palpitations.  Limited in ambulation due to left-sided hemiparesis as a consequence of stroke.  Had echocardiogram this morning.  Final read pending.  Normal valve function.  Good blood pressure control at home.    Visit 03/08/2021:  Doing well. Continues to do physical therapy. Feels stronger.   No chest pain, shortness of breath, palpitations or dizziness.   BP has been normal at home.  Limited ambulation due to hemiparesis, can walk with a walker.   Echocardiogram and CT chest stable.  Planning to leave for [place] in the next few weeks. Will stay for three months.     Visit 10/11/2021:  He continues to do well from cardiovascular perspective. More specifically he denies of having any chest pain, shortness of breath, orthopnea or paroxysmal nocturnal dyspnea. Furthermore, he denies of having any palpitations, LE edema or lightheadedness.   Completed physical theapy.   Limited ambulation due to hemiparesis, can walk with a walker.   BP has been normal  Stayed in [place] for 3 months    Visit 12/29/2021:  He continues to do well from cardiovascular perspective. More specifically he denies of having any chest pain, shortness of breath, orthopnea or paroxysmal nocturnal dyspnea. Furthermore, he denies of having any palpitations, LE edema or lightheadedness.   Can walk a little bit a walker.   BP has been in the 100 range.  Visit 05/04/2022:  Doing well. No chest pain, shortness of breath, palpitations or dizziness.   Can walk a little bit with a walker.   BP has been normal at home. Checking twice a day.   Still taking carvedilol, losartan and amlodipine.     INTERVAL HISTORY:  Visit 09/07/2022:  Just came back from [place]   Can walk with a walker.   BP normal at home.   No CP, SOB, palpitations or dizziness.   Had echocardiogram today. Final read pending.     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)    Aortic stenosis    History of aortic valve replacement    Hypertension    Gout    Seborrheic dermatitis    Urinary frequency    CVA (cerebral vascular accident) (HCC/RAF)    H/O aortic valve replacement    Bicuspid aortic valve    Age related osteoporosis    Prediabetes       PAST SURGICAL HISTORY:  Past Surgical History:   Procedure Laterality Date    AORTIC VALVE REPLACEMENT         ALLERGIES:  No Known Allergies    SOCIAL HISTORY:  Social History     Tobacco Use    Smoking status: Former     Current packs/day: 0.00     Average packs/day: 1 pack/day for 15.0 years (15.0 ttl pk-yrs)     Types: Cigarettes     Start date: 05/03/1975     Quit date: 05/02/1990     Years since quitting: 32.3    Smokeless tobacco: Never   Substance Use Topics    Alcohol use: Not Currently     Comment: occasionally has red wine    Drug use: Not Currently       FAMILY HISTORY:  Family History   Problem Relation Age of Onset    No Known Problems Mother     Other (gastric ulcer) Father     Uterine cancer Sister     Stroke Brother     Kidney failure Brother     Colon cancer Neg Hx     Lung cancer Neg Hx     Prostate cancer Neg Hx        REVIEW OF SYSTEMS:  All 14 systems were reviewed and were negative except as mentioned in the HPI    PHYSICAL EXAMINATION:  VITALS: BP 131/90  ~ Pulse 69  ~ Temp 36.2 ?C (97.2 ?F) (Forehead)  ~ Resp 16  ~ Ht 5' 6'' (1.676 m)  ~ Wt 191 lb 3.2 oz (86.7 kg)  ~ SpO2 95% Comment: RA ~ BMI 30.86 kg/m?    General: Alert and oriented, not in acute distress.  HEENT: Normocephalic, atraumatic. Sclerae are anicteric.   Neck: Supple, no lymphadenopathy, JVP flat  Respiratory: Clear to auscultation bilaterally, no wheezing or rales.   Cardiac: RRR, S1/S2 normal, no murmurs, rubs or gallops.   Abdomen: Soft, non-tender, non-distended, normoactive bowel sounds.  Extremities: No cyanosis, clubbing or edema.    Integument: Warm, no petechiae. No acute rash.  Neurologic/Psychiatric: Alert and oriented. Affect is appropriate.       OUTPATIENT MEDICATIONS:  Current Outpatient Medications   Medication Sig    ALLOPURINOL 100 mg tablet TAKE 2 TABLETS BY MOUTH EVERY DAY  amLODIPine 5 mg tablet Take 1 tablet (5 mg total) by mouth daily.    amoxicillin 500 mg capsule Prior to dentalappointment.    ascorbic acid 500 mg tablet Take 1 tablet (500 mg total) by mouth daily.    aspirin 81 mg EC tablet Take 1 tablet (81 mg total) by mouth daily.    ATORVASTATIN 20 mg tablet TAKE 1 TABLET BY MOUTH EVERYDAY AT BEDTIME    CALCIUM PO Take 500 mg by mouth two (2) times daily.    carvedilol 6.25 mg tablet Take 1 tablet (6.25 mg total) by mouth two (2) times daily.    Cholecalciferol 50 mcg (2000 units) TABS Take 1 tablet (50 mcg total) by mouth daily.    FOLIC ACID PO Take 1,000 mcg by mouth daily.    losartan 25 mg tablet Take 1 tablet (25 mg total) by mouth two (2) times daily.    Multiple Vitamins-Minerals (HCA SUPER THERAVITE-M PO) Take by mouth daily.     No current facility-administered medications for this visit.       LABORATORY:  Lab Results   Component Value Date    WBC 5.48 07/23/2020    HGB 15.9 07/23/2020    HCT 47.0 07/23/2020    MCV 86.9 07/23/2020    PLT 203 07/23/2020      Lab Results   Component Value Date    CREAT 0.73 07/15/2022    BUN 14 07/15/2022    NA 142 07/15/2022    K 3.8 07/15/2022    CL 103 07/15/2022    CO2 26 07/15/2022      Lab Results   Component Value Date    HGBA1C 6.1 (H) 07/15/2022      Lab Results   Component Value Date    ALT 24 07/15/2022    AST 23 07/15/2022    ALKPHOS 56 07/15/2022    BILITOT 0.5 07/15/2022    No results found for: ''TSH''   Lab Results   Component Value Date    CALCIUM 9.1 07/15/2022      Lab Results   Component Value Date    CHOL 131 07/15/2022    CHOLHDL 41 07/15/2022    CHOLDLCAL 73 07/15/2022    CHOLDLQ 61 03/11/2012 TRIGLY 86 07/15/2022       Lab Results  (Last 360 days)        04/14 0822 12/23/21 0833    Result             25                       46                         DIAGNOSTIC STUDIES:  ECHO 04/20/2019:  1. Left ventricle is normal in size with mild hypertrophy. Left ventricular   function is preserved and visually estimated to be 60-65%. There are no wall   motion abnormalities.   2. Normal biatrial size.   3. Normal right ventricular size and systolic function.   4. Bioprosthetic valve (25-mm Carpentier-Edwards Magna Ease ; 1/13; Dr. Charyl Dancer)   is well seated in the aortic position. Peak velocity of 2.26 m/sec, mean   gradient of 11 mmHg, DI of 0.46, effective orifice area of 1.44 cm^2 (based   on LVOT diameter of 2cm)--consistent with normal prosthetic function.   5. Lack of an adequate TR signal precludes accurate estimation of right   ventricular  systolic pressure. IVC diameter is = 2.1 cm with a > 50%   inspiratory collapse, suggestive of a right atrial pressure of 0-5 mmHg    ECHO 09/21/2020:   1. Normal LV size, mild septal hypertrophy, normal systolic function, normal LV diastolic function.   2. Left ventricular ejection fraction is approximately 60 to 65%.   3. Normal right ventricular size and normal systolic function.   4. A 25 mm Carpentier bioprosthetic valve is present in the aortic valve position. Echo findings are consistent with normal structure and function of the aortic prosthesis.   5. Normal pulmonary artery systolic pressure.   6. There are no prior echocardiograms available for comparison.    ECHO 09/06/2021:   1. Normal LV size, mild septal hypertrophy, normal wall motion and systolic function.   2. Left ventricular ejection fraction is approximately 60 to 65%.   3. Mild LV diastolic dysfunction (grade I, impaired relaxation).   4. Normal right ventricular size and grossly normal systolic function.   5. A 25 mm Carpentier bioprosthetic valve is present in the aortic valve position. It appears well seated. No paravalvular regurgitation. Peak aortic velocity is 1.9 m/sec and mean gradient is 8.0 mm Hg. By the continuity equation, the aortic valve area  is 1.74 cm? (Indexed AVA is 0.93 cm?/m?).   6. Upper normal aortic root (3.8 cm). Mildly enlarged mid ascending aorta (4.2 cm).   7. No significant valvular regurgitation.   8. Tricuspid regurgitant jet inadequate to assess pulmonary artery systolic pressure.   9. A prior echo performed on 09/21/2020 was reviewed for comparison. No significant changes noted since the previous study.    ECHO 12/29/2021:   1. Normal LV size, mild concentric left ventricular hypertrophy, normal systolic function, indeterminate diastolic function.   2. Left ventricular ejection fraction is approximately 60 to 65%.   3. Normal right ventricular size and normal systolic function.   4. Status post Carpentier bioprosthetic placement in the aortic valve position without regurgitation. Echo findings are consistent with normal structure and function of the aortic prosthesis. The aortic valve area is 1.39 cm? (Indexed AVA is 0.74 cm?/m?).   5. Trace tricuspid regurgitation and normal pulmonary artery systolic pressure.   6. A prior echo performed on 09/06/2021 was reviewed for comparison. The mean AV gradient has increased from 8 mmHg to 12 mmHg. Aortic valve was 1.74 cm2 in the previous study.    CT chest 09/21/2020:  Postoperative changes of aortic valve replacement. Ectasia of the ascending aorta measures up to 3.9 cm in the proximal arch grossly unchanged since 2013    ECG 06/07/2020:  Normal sinus rhythm at ventricular rate of 74 beats per minute.  Mild ST elevation in inferior leads.  Described before.     []  Reviewed/ordered radiology  []  Independently interpreted studies    [x]  Reviewed/ordered labs  [x]  Reviewed & summarized old records    [x]  Reviewed/ordered diag med test   []  Decided to get outside medical records      []  Obtained history from someone other than patient    ASSESSMENT/PLAN:  1. Aortic valve replacement in 2013  Normal valve function on most recent echocardiogram in September 2023. ECHO repeated this morning. Final read pending, I reviewed the images. No change.   We discussed that some degeneration of the bioprosthetic valve can be expected 10 years after surgery so he will need close follow-up with serial echocardiograms  Continue aspirin    2. Bicuspid aortic valve  We  discussed with there is an increased risk of having aortic aneurysm in patients with bicuspid aortic valve  Will need serial imaging. Most recent test in 08/2020. Stable from 2013. No change on echo in 08/2021. Repeat echo today with no change. .   First-degree family members should be screened with echocardiograms    3. History of CVA  Residual leg weakness    4. Hyperlipidemia  On atorvastatin. LDL stable.     5. Abnormal EKG  Patient has ST elevations in the inferior leads which have been present before.  I reviewed the records.  No further testing is necessary in this regard.  He had 2 negative coronary angiograms    6. Hypertension  Good control on Coreg, anlodipine and Losartan. CtM    Follow up visit in 6 months    Please note, I spent a total of 40 minutes for this visit including chart review, test interpretation, history taking, physical examination, counseling, coordination of care and documentation.    Plan of care was discussed with the patient and family in detail. All questions answered.    Charlton Amor, MD  Advanced Heart Failure and Transplantation Cardiology  Pager [phone]

## 2022-11-26 MED ORDER — ASPIRIN LOW DOSE 81 MG PO TBEC
81 mg | ORAL_TABLET | Freq: Every day | ORAL | 8 refills | 30.00000 days | Status: AC
Start: 2022-11-26 — End: ?

## 2022-11-28 MED ORDER — ASPIRIN 81 MG PO TBEC
81 mg | ORAL_TABLET | Freq: Every day | ORAL | 2 refills | Status: AC
Start: 2022-11-28 — End: ?

## 2022-12-27 MED ORDER — AMLODIPINE BESYLATE 5 MG PO TABS
5 mg | ORAL_TABLET | Freq: Every day | ORAL | 3 refills | 60.00000 days | Status: AC
Start: 2022-12-27 — End: ?

## 2022-12-28 MED ORDER — AMLODIPINE BESYLATE 5 MG PO TABS
5 mg | ORAL_TABLET | Freq: Every day | ORAL | 3 refills | Status: AC
Start: 2022-12-28 — End: ?

## 2023-01-01 ENCOUNTER — Telehealth: Payer: PRIVATE HEALTH INSURANCE

## 2023-01-01 NOTE — Telephone Encounter
New Rx Request      Last seen by MD: 09/07/2022    Reason for the request: Medication not showing on current list of meds. Please send to [id number] - [place], [place] - [address] [[id number]]    Any Symptoms:  []  Yes  [x]  No      If yes, what symptoms are you experiencing:    Duration of symptoms (how long):    Have you taken medication for symptoms (OTC or Rx):      Is a particular medication being requested? losartan 25 mg tablet; carvedilol 6.25 mg tablet     Was an appointment offered? no    Patient or caller has been notified of the turnaround time of 1-2 business day(s).

## 2023-01-02 MED ORDER — LOSARTAN POTASSIUM 25 MG PO TABS
25 mg | ORAL_TABLET | Freq: Every day | ORAL | 3 refills | Status: AC
Start: 2023-01-02 — End: ?

## 2023-01-02 MED ORDER — CARVEDILOL 6.25 MG PO TABS
6.25 mg | ORAL_TABLET | Freq: Two times a day (BID) | ORAL | 3 refills | Status: AC
Start: 2023-01-02 — End: ?

## 2023-01-28 ENCOUNTER — Telehealth: Payer: PRIVATE HEALTH INSURANCE

## 2023-01-28 NOTE — Telephone Encounter
Call Back Request      Reason for call back: wife reporting swollen left feet, past 2 weeks. states previously had stroke 2013 that affected left side of body. scheduled for appt on 12/20, wanted to let md know to get guidance    cb: [phone]    Any Symptoms:  [x]  Yes  []  No      If yes, what symptoms are you experiencing:    Symptom: Foot or Ankle Swelling  Outcome: Schedule Appointment  Reason: Caller denied all higher acuity questions  Duration of symptoms (how long):  2 weeks  Have you taken medication for symptoms (OTC or Rx):  n/a    If call was taken outside of clinic hours:    [] Patient or caller has been notified that this message was sent outside of normal clinic hours.     [] Patient or caller has been warm transferred to the physician's answering service. If applicable, patient or caller informed to please call us back if symptoms progress.  Patient or caller has been notified of the turnaround time of 1-2 business day(s).

## 2023-02-12 ENCOUNTER — Telehealth: Payer: PRIVATE HEALTH INSURANCE

## 2023-02-12 ENCOUNTER — Ambulatory Visit: Payer: PRIVATE HEALTH INSURANCE

## 2023-02-12 NOTE — Telephone Encounter
Called patient today to check how is he doing since wife reported some LE swelling. No answer, left voicemail.

## 2023-03-09 MED ORDER — ALLOPURINOL 100 MG PO TABS
200 mg | ORAL_TABLET | Freq: Every day | ORAL | 2 refills
Start: 2023-03-09 — End: ?

## 2023-03-11 MED ORDER — ALLOPURINOL 100 MG PO TABS
200 mg | ORAL_TABLET | Freq: Every day | ORAL | 1 refills | Status: AC
Start: 2023-03-11 — End: ?

## 2023-03-22 ENCOUNTER — Ambulatory Visit: Payer: PRIVATE HEALTH INSURANCE | Attending: Cardiovascular Disease

## 2023-05-01 ENCOUNTER — Inpatient Hospital Stay: Payer: PRIVATE HEALTH INSURANCE | Attending: Student in an Organized Health Care Education/Training Program

## 2023-05-01 DIAGNOSIS — Z136 Encounter for screening for cardiovascular disorders: Secondary | ICD-10-CM

## 2023-05-21 ENCOUNTER — Other Ambulatory Visit: Payer: Commercial Managed Care - HMO

## 2023-05-23 ENCOUNTER — Ambulatory Visit: Payer: Commercial Managed Care - HMO

## 2023-05-29 ENCOUNTER — Ambulatory Visit: Payer: Commercial Managed Care - HMO | Attending: Cardiovascular Disease

## 2023-05-29 DIAGNOSIS — Z952 Presence of prosthetic heart valve: Secondary | ICD-10-CM

## 2023-05-29 DIAGNOSIS — I10 Essential (primary) hypertension: Secondary | ICD-10-CM

## 2023-05-29 NOTE — Progress Notes
 Cardiology Clinic    PATIENT: Andrew [name]  MRN: [id number]  DOB: 1955/07/19  DATE OF SERVICE: 05/29/2023      REFERRING PHYSICIAN: No primary care provider on file.    REASON FOR VISIT:  History of aortic valve replacement, routine follow-up    HISTORY OF PRESENT ILLNESS:  Andrew [name] is a delightful 68 y.o. gentleman with past medical history significant for bicuspid aortic valve status post aortic valve replacement in January of 2013 with perioperative CVA, chronic ST elevation in inferior leads, hypertension.  He was referred to our Cardiology Clinic by Dr. Tora Duck to establish care.  I reviewed his previous records in detail.    Andrew [name] has a history of bicuspid aortic valve.  In January 2013 he underwent aortic valve replacement with bioprosthetic valve at [facility].  Unfortunately, after the procedure he had CVA with residual left leg paresis.  In the perioperative period he was found to have ST elevations in the inferior leads for which he underwent coronary angiogram which was normal.  He had repeat angiogram in May of 2013 for similar findings.  He also has a history of hypertension for which he is on amlodipine and losartan.    Andrew [name] reports of doing well from cardiovascular standpoint.  He denies of having any chest pain, shortness of breath, orthopnea or paroxysmal nocturnal dyspnea.  Furthermore, he denies of having any dizziness, palpitations or lower extremity edema.  His blood pressure has been well controlled at home.    Andrew [name] is originally from [place]  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  He lives in [place].  He stopped smoking 40 years ago.  He drinks alcohol seldomly in social situations.  There is no history of illicit drug use.  His family history significant for brother who had stroke in his 56s.    Visit 09/21/2020  No change from cardiovascular standpoint.  He denies of having any chest pain, shortness of breath, orthopnea paroxysmal nocturnal dyspnea.  Furthermore, he denies of having any lightheadedness, lower extremity edema or palpitations.  Limited in ambulation due to left-sided hemiparesis as a consequence of stroke.  Had echocardiogram this morning.  Final read pending.  Normal valve function.  Good blood pressure control at home.    Visit 03/08/2021:  Doing well. Continues to do physical therapy. Feels stronger.   No chest pain, shortness of breath, palpitations or dizziness.   BP has been normal at home.  Limited ambulation due to hemiparesis, can walk with a walker.   Echocardiogram and CT chest stable.  Planning to leave for [place] in the next few weeks. Will stay for three months.     Visit 10/11/2021:  He continues to do well from cardiovascular perspective. More specifically he denies of having any chest pain, shortness of breath, orthopnea or paroxysmal nocturnal dyspnea. Furthermore, he denies of having any palpitations, LE edema or lightheadedness.   Completed physical theapy.   Limited ambulation due to hemiparesis, can walk with a walker.   BP has been normal  Stayed in [place] for 3 months    Visit 12/29/2021:  He continues to do well from cardiovascular perspective. More specifically he denies of having any chest pain, shortness of breath, orthopnea or paroxysmal nocturnal dyspnea. Furthermore, he denies of having any palpitations, LE edema or lightheadedness.   Can walk a little bit a walker.   BP has been in the 100  range.    Visit 05/04/2022:  Doing well. No chest pain, shortness of breath, palpitations or dizziness.   Can walk a little bit with a walker.   BP has been normal at home. Checking twice a day.   Still taking carvedilol, losartan and amlodipine.     Visit 09/07/2022:  Just came back from [place]   Can walk with a walker.   BP normal at home.   No CP, SOB, palpitations or dizziness.   Had echocardiogram today. Final read pending.     INTERVAL HISTORY:  Visit 05/29/2023:  Doing well from cardiovascular standpoint. Denies chest pain, SOB, palpitations or dizziness.   Walking very little with a walker.   BP has been normal at home.   Echocardiogram has been stable.    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)    Aortic stenosis    History of aortic valve replacement    Hypertension    Gout    Seborrheic dermatitis    Urinary frequency    CVA (cerebral vascular accident) (HCC/RAF)    H/O aortic valve replacement    Bicuspid aortic valve    Age related osteoporosis    Prediabetes       PAST SURGICAL HISTORY:  Past Surgical History:   Procedure Laterality Date    AORTIC VALVE REPLACEMENT         ALLERGIES:  No Known Allergies    SOCIAL HISTORY:  Social History     Tobacco Use    Smoking status: Former     Current packs/day: 0.00     Average packs/day: 1 pack/day for 15.0 years (15.0 ttl pk-yrs)     Types: Cigarettes     Start date: 05/03/1975     Quit date: 05/02/1990     Years since quitting: 33.0    Smokeless tobacco: Never   Substance Use Topics    Alcohol use: Not Currently     Comment: occasionally has red wine    Drug use: Not Currently       FAMILY HISTORY:  Family History   Problem Relation Age of Onset    No Known Problems Mother     Other (gastric ulcer) Father     Uterine cancer Sister     Stroke Brother     Kidney failure Brother     Colon cancer Neg Hx     Lung cancer Neg Hx     Prostate cancer Neg Hx        REVIEW OF SYSTEMS:  All 14 systems were reviewed and were negative except as mentioned in the HPI    PHYSICAL EXAMINATION:  VITALS: BP 102/70  ~ Pulse 80  ~ Temp 36.7 ?C (98.1 ?F) (Temporal)  ~ Resp 16  ~ SpO2 94%    General: Alert and oriented, not in acute distress.  HEENT: Normocephalic, atraumatic. Sclerae are anicteric.   Neck: Supple, no lymphadenopathy, JVP flat  Respiratory: Clear to auscultation bilaterally, no wheezing or rales.   Cardiac: RRR, S1/S2 normal, no murmurs, rubs or gallops.   Abdomen: Soft, non-tender, non-distended, normoactive bowel sounds.  Extremities: No cyanosis, clubbing or edema.    Integument: Warm, no petechiae. No acute rash.  Neurologic/Psychiatric: Alert and oriented. Affect is appropriate.       OUTPATIENT MEDICATIONS:  Current Outpatient  Medications   Medication Sig    ALLOPURINOL 100 mg tablet TAKE 2 TABLETS BY MOUTH EVERY DAY    AMLODIPINE 5 mg tablet TAKE 1 TABLET (5 MG TOTAL) BY MOUTH DAILY.    amoxicillin 500 mg capsule Prior to dentalappointment.    ascorbic acid 500 mg tablet Take 1 tablet (500 mg total) by mouth daily.    aspirin 81 mg EC tablet Take 1 tablet (81 mg total) by mouth daily.    ATORVASTATIN 20 mg tablet TAKE 1 TABLET BY MOUTH EVERYDAY AT BEDTIME    CALCIUM PO Take 500 mg by mouth two (2) times daily.    carvedilol 6.25 mg tablet Take 1 tablet (6.25 mg total) by mouth two (2) times daily.    FOLIC ACID PO Take 1,000 mcg by mouth daily.    losartan 25 mg tablet Take 1 tablet (25 mg total) by mouth daily.    Multiple Vitamins-Minerals (HCA SUPER THERAVITE-M PO) Take by mouth daily.     No current facility-administered medications for this visit.       LABORATORY:  Lab Results   Component Value Date    WBC 5.48 07/23/2020    HGB 15.9 07/23/2020    HCT 47.0 07/23/2020    MCV 86.9 07/23/2020    PLT 203 07/23/2020      Lab Results   Component Value Date    CREAT 0.69 05/25/2023    BUN 12 05/25/2023    NA 137 05/25/2023    K 3.8 05/25/2023    CL 100 05/25/2023    CO2 28 05/25/2023      Lab Results   Component Value Date    HGBA1C 6.4 (H) 05/25/2023      Lab Results   Component Value Date    ALT 27 05/25/2023    AST 24 05/25/2023    ALKPHOS 67 05/25/2023    BILITOT 0.6 05/25/2023    No results found for: ''TSH''   Lab Results   Component Value Date    CALCIUM 8.9 05/25/2023      Lab Results   Component Value Date    CHOL 123 05/25/2023    CHOLHDL 38 (L) 05/25/2023    CHOLDLCAL 68 05/25/2023    CHOLDLQ 61 03/11/2012    TRIGLY 84 05/25/2023       Lab Results  (Last 360 days)        02/22 0919 07/15/22 0822    Result             56                       25                       DIAGNOSTIC STUDIES:  ECHO 04/20/2019:  1. Left ventricle is normal in size with mild hypertrophy. Left ventricular   function is preserved and visually estimated to be 60-65%. There are no wall   motion abnormalities.   2. Normal biatrial size.   3. Normal right ventricular size and systolic function.   4. Bioprosthetic valve (25-mm Carpentier-Edwards Magna Ease ; 1/13; Dr. Charyl Dancer)   is well seated in the aortic position. Peak velocity of 2.26 m/sec, mean   gradient of 11 mmHg, DI of 0.46, effective orifice area of 1.44 cm^2 (based   on LVOT diameter of 2cm)--consistent with normal prosthetic function.   5. Lack of an adequate TR signal precludes accurate estimation of right  ventricular systolic pressure. IVC diameter is = 2.1 cm with a > 50%   inspiratory collapse, suggestive of a right atrial pressure of 0-5 mmHg    ECHO 09/21/2020:   1. Normal LV size, mild septal hypertrophy, normal systolic function, normal LV diastolic function.   2. Left ventricular ejection fraction is approximately 60 to 65%.   3. Normal right ventricular size and normal systolic function.   4. A 25 mm Carpentier bioprosthetic valve is present in the aortic valve position. Echo findings are consistent with normal structure and function of the aortic prosthesis.   5. Normal pulmonary artery systolic pressure.   6. There are no prior echocardiograms available for comparison.    ECHO 09/06/2021:   1. Normal LV size, mild septal hypertrophy, normal wall motion and systolic function.   2. Left ventricular ejection fraction is approximately 60 to 65%.   3. Mild LV diastolic dysfunction (grade I, impaired relaxation).   4. Normal right ventricular size and grossly normal systolic function.   5. A 25 mm Carpentier bioprosthetic valve is present in the aortic valve position. It appears well seated. No paravalvular regurgitation. Peak aortic velocity is 1.9 m/sec and mean gradient is 8.0 mm Hg. By the continuity equation, the aortic valve area  is 1.74 cm? (Indexed AVA is 0.93 cm?/m?).   6. Upper normal aortic root (3.8 cm). Mildly enlarged mid ascending aorta (4.2 cm).   7. No significant valvular regurgitation.   8. Tricuspid regurgitant jet inadequate to assess pulmonary artery systolic pressure.   9. A prior echo performed on 09/21/2020 was reviewed for comparison. No significant changes noted since the previous study.    ECHO 12/29/2021:   1. Normal LV size, mild concentric left ventricular hypertrophy, normal systolic function, indeterminate diastolic function.   2. Left ventricular ejection fraction is approximately 60 to 65%.   3. Normal right ventricular size and normal systolic function.   4. Status post Carpentier bioprosthetic placement in the aortic valve position without regurgitation. Echo findings are consistent with normal structure and function of the aortic prosthesis. The aortic valve area is 1.39 cm? (Indexed AVA is 0.74 cm?/m?).   5. Trace tricuspid regurgitation and normal pulmonary artery systolic pressure.   6. A prior echo performed on 09/06/2021 was reviewed for comparison. The mean AV gradient has increased from 8 mmHg to 12 mmHg. Aortic valve was 1.74 cm2 in the previous study.    ECHO 09/07/2022  1. Normal LV systolic function. Normal left ventricular regional wall motion.  Left ventricular ejection fraction is approximately 60-65%.  2. Small LV size and moderate concentric left ventricular hypertrophy.  3. Mild LV diastolic dysfunction (grade I, impaired relaxation).  4. Not well seen right ventricular size and normal systolic function.  5. Normal atrial sizes.  6. A 25 mm Carpentier bioprosthetic valve is present in the aortic valve  position. Echo findings are consistent with normal structure and function of  the aortic prosthesis with mean gradient of 14 mmHg and AVA 1.26cm2.  7. Trace mitral valve regurgitation.  8. Trace to mild tricuspid valve regurgitation.  9. Tricuspid regurgitant jet inadequate to assess pulmonary artery systolic  pressure.  10. Normal mean pulmonary artery pressure by PVAT.  11. Mildly enlarged mid ascending aorta (4.1 cm).  12. A prior echo performed on 12/29/2021 was reviewed for comparison.  Carpentier valve measurements and findings as above similar to prior study.    CT chest 09/21/2020:  Postoperative changes of aortic valve replacement. Ectasia of  the ascending aorta measures up to 3.9 cm in the proximal arch grossly unchanged since 2013    ECG 06/07/2020:  Normal sinus rhythm at ventricular rate of 74 beats per minute.  Mild ST elevation in inferior leads.  Described before.     []  Reviewed/ordered radiology  []  Independently interpreted studies    [x]  Reviewed/ordered labs  [x]  Reviewed & summarized old records    [x]  Reviewed/ordered diag med test   []  Decided to get outside medical records      []  Obtained history from someone other than patient    ASSESSMENT/PLAN:  1. Aortic valve replacement in 2013  Normal valve function on most recent echocardiogram in 09/2022. Repeat echo yearly. Schedule with our next office visit.   We discussed that some degeneration of the bioprosthetic valve can be expected 10 years after surgery so he will need close follow-up with serial echocardiograms  Continue aspirin    2. Bicuspid aortic valve  We discussed with there is an increased risk of having aortic aneurysm in patients with bicuspid aortic valve  Will need serial imaging. Most recent test in 08/2020. Stable from 2013. No change on echo in 09/2022. Repeat echo with our next visit.    First-degree family members should be screened with echocardiograms    3. History of CVA  Residual leg weakness    4. Hyperlipidemia  On atorvastatin. LDL stable.     5. Abnormal EKG  Patient has ST elevations in the inferior leads which have been present before.  I reviewed the records.  No further testing is necessary in this regard.  He had 2 negative coronary angiograms    6. Hypertension  Good control on Coreg, anlodipine and Losartan. CtM    Follow up visit in 6 months    Please note, I spent a total of 30 minutes for this visit including chart review, test interpretation, history taking, physical examination, counseling, coordination of care and documentation.    Plan of care was discussed with the patient and family in detail. All questions answered.    Charlton Amor, MD  Advanced Heart Failure and Transplantation Cardiology  Pager [phone]

## 2023-06-01 MED ORDER — ATORVASTATIN CALCIUM 20 MG PO TABS
20 mg | ORAL_TABLET | Freq: Every evening | ORAL | 3 refills | Status: AC
Start: 2023-06-01 — End: ?

## 2023-06-17 ENCOUNTER — Telehealth: Payer: Commercial Managed Care - HMO

## 2023-06-17 NOTE — Telephone Encounter
 Call Back Request      Reason for call back: [name] pt's wife calling in states there has been a change to pt's rx:       losartan 25 mg tablet      Per wife the rx directions should be take 1tab twice daily since 2013. States his rx last refill in March he picked up his last rx but it was short. Per wife she was told the rx was changed to take 1tab daily. Please call them back to advise. Thank you    Any Symptoms:  []  Yes  [x]  No      If yes, what symptoms are you experiencing:    Duration of symptoms (how long):    Have you taken medication for symptoms (OTC or Rx):      If call was taken outside of clinic hours:    [x] Patient or caller has been notified that this message was sent outside of normal clinic hours.     [] Patient or caller has been warm transferred to the physician's answering service. If applicable, patient or caller informed to please call us back if symptoms progress.  Patient or caller has been notified of the turnaround time of 1-2 business day(s).

## 2023-06-21 ENCOUNTER — Ambulatory Visit: Payer: Commercial Managed Care - HMO

## 2023-06-21 MED ORDER — LOSARTAN POTASSIUM 25 MG PO TABS
25 mg | ORAL_TABLET | Freq: Two times a day (BID) | ORAL | 3 refills | Status: AC
Start: 2023-06-21 — End: ?

## 2023-06-22 MED ORDER — LOSARTAN POTASSIUM 25 MG PO TABS
25 mg | ORAL_TABLET | Freq: Every day | ORAL | 3 refills
Start: 2023-06-22 — End: ?

## 2023-06-24 MED ORDER — LOSARTAN POTASSIUM 25 MG PO TABS
25 mg | ORAL_TABLET | Freq: Every day | ORAL | 3 refills
Start: 2023-06-24 — End: ?

## 2023-07-17 ENCOUNTER — Other Ambulatory Visit: Payer: Commercial Managed Care - Pharmacy Benefit Manager

## 2023-07-17 ENCOUNTER — Telehealth: Payer: Commercial Managed Care - Pharmacy Benefit Manager

## 2023-07-17 NOTE — Telephone Encounter
 Appointment Accommodation Request      Appointment Type:Well exam     Reason for sooner request: Patient would like to be seen ASAP     Date/Time Requested (If any): ASAP    Last seen by MD:     Any Symptoms:  [x]  Yes  []  No  Symptoms: Abdominal Pain - Male, Mouth - Unusual Taste  Outcome: Schedule Appointment  Reason: Caller denied all higher acuity questions    If yes, what symptoms are you experiencing:   Duration of symptoms (how long):     Patient or caller was offered an appointment but declined.    Patient or caller was advised to seek emergency services if conditions are urgent or emergent.    Patient or caller has been notified of the turnaround time of 1-2 business (days).

## 2023-07-17 NOTE — Telephone Encounter
 LVM for patient to callback to schedule annual wellness exam. Dr. Connor Deiters will not be back in clinic until 5/15 but patient can be scheduled with another resident doctor if they need a sooner appointment.

## 2023-07-30 ENCOUNTER — Ambulatory Visit: Payer: Commercial Managed Care - Pharmacy Benefit Manager

## 2023-07-30 DIAGNOSIS — R0789 Other chest pain: Secondary | ICD-10-CM

## 2023-07-30 NOTE — Progress Notes
 Aaron Aas  PATIENT: Andrew [name]   MRN: [id number]   DOB: 13-Jan-1956   DATE OF SERVICE:  07/30/2023   CARE TEAM: Patient Care Team:  Pcp, Unassigned Generic [name] as PCP - Plan Assigned    Subjective:     Chief Complaint   Patient presents with    Chest Pain    Shortness of Breath    Generalized Weakness    Hypertension     Pt is a 67yM w/ pmhx of HTN, aortic stenosis and aortic valve replacement, CVA.   The patient presents for chest pain, shortness of breath, and generalized weakness for the past two weeks. Per patient pain is worse at night and with exertion. Denies hx of similar pain in the past.       No outpatient medications have been marked as taking for the 07/30/23 encounter (Office Visit) with Karell Tukes, MD.     No Known Allergies  Social History     Tobacco Use    Smoking status: Former     Current packs/day: 0.00     Average packs/day: 1 pack/day for 15.0 years (15.0 ttl pk-yrs)     Types: Cigarettes     Start date: 05/03/1975     Quit date: 05/02/1990     Years since quitting: 33.2    Smokeless tobacco: Never   Substance Use Topics    Alcohol use: Not Currently     Comment: occasionally has red wine    Drug use: Not Currently       Review of Systems: neg except HPI       Objective:     BP 110/72  ~ Pulse 73  ~ Temp 36.3 ?C (97.3 ?F) (Tympanic)  ~ Resp 20  ~ SpO2 93%      .Physical Exam  HENT:      Mouth/Throat:      Mouth: Mucous membranes are moist.   Cardiovascular:      Rate and Rhythm: Normal rate.   Pulmonary:      Effort: Pulmonary effort is normal.   Neurological:      General: No focal deficit present.      Mental Status: He is alert and oriented to person, place, and time.      Comments: In wheelchair, endorses weakness           MedicalDecisionMaking/Assessment/Plan:         1. Other chest pain        67yM pmhx of aortic stenosis and aortic valve replacement and hx of CVA presents with exertional chest pain and SOB for 2 weeks.   EKG with R bundle branch block, questionable st elevation and inferior injury     In patient with exertional chest pain with significant pmhx, will send to ED for evaluation     New or Modified Medications for this Encounter   New    No medications on file   Modified    No medications on file   Discontinued Medications    No medications on file         No follow-ups on file. sooner if symptoms worse or fail to resolve       The above plan of care, diagnosis, orders, and follow-up were discussed with the patient.  The patient had all questions answered satisfactorily and understands this recommended plan of care.  See AVS for additional information and counseling materials provided to the patient.     Author:  Lasandra Batley,  MD 07/30/2023 4:15 PM

## 2023-07-30 NOTE — Nursing Note
 [facility] Immediate Care Nurse Hand off to ED    Patient: Andrew [name] is a 68 y.o. male     Chief Complaint: Chest Pain, Shortness of Breath, Generalized Weakness, and Hypertension      Assessment: not currently having chest pain.       VS: BP 110/72  ~ Pulse 73  ~ Temp 36.3 ?C (97.3 ?F) (Tympanic)  ~ Resp 20  ~ SpO2 93%     [x]    EKG.  Read by MD: Dr. S. Yusa        Medications given during this Encounter: n/a           Indication for upgrade to higher level of care: History of Aortic valve replacement, CVA, needs thorough cardiac evaluation.       By MD: Dr. Renelle Carte    Transport to [facility]  via:     [x]  Wheelchair with patient transport per MD Dr. Renelle Carte        Report given to RN [facility] : Delma Fern, RN      Joe Murders notified of plan of care.

## 2023-07-30 NOTE — ED Provider Notes
 Andrew [name] [facility]  Emergency Department Service Report    Triage     Andrew [name], a 68 y.o. male, presents with Chest Pain (chest pain x 2 weeks, worsens at night, SOB, weakness, elevated BP at home, h/o aortic valve replacement with subsequent CVA/coming from Immediate Care /) and Shortness of Breath    Arrived on 07/30/2023 at 4:54 PM   Arrived by Wheelchair [4]    ED Triage Vitals   Temp Temp Source BP Heart Rate Resp SpO2 O2 Device Pain Score Weight   07/30/23 1700 07/30/23 1659 07/30/23 1659 07/30/23 1659 07/30/23 1950 07/30/23 1659 07/30/23 2309 07/30/23 2309 07/30/23 1700   36.8 ?C (98.3 ?F) Oral 109/72 75 19 94 % None (Room air) Zero 86.2 kg (190 lb)       No Known Allergies     Initial Physician Contact            History     Patient is a 68 year old male with history of aortic valve replacement in 2023 at [facility], CVA with left leg paresis, presenting for chest pain and shortness of breath    Endorses 2 weeks of left-sided chest pain that is nonexertional, lasts for a few hours.  Normally starts at night when he lays down. Denies any leg swelling, paroxysmal nocturnal dyspnea, orthopnea. Is wondering if he can get an echo to evaluate for possible new aortic valve. No recent travel or illnesses, no fever or chills.    Past Medical History:   Diagnosis Date    CVA (cerebral vascular accident) (HCC/RAF)     H/O aortic valve replacement     Kidney stone     Urinary frequency         Past Surgical History:   Procedure Laterality Date    AORTIC VALVE REPLACEMENT          Past Family History   family history includes Kidney failure in his brother; No Known Problems in his mother; Stroke in his brother; Uterine cancer in his sister; gastric ulcer in his father.     Past Social History   he reports that he quit smoking about 33 years ago. His smoking use included cigarettes. He started smoking about 48 years ago. He has a 15 pack-year smoking history. He has never used smokeless tobacco. He reports that he does not currently use alcohol. He reports that he does not currently use drugs. No history on file for sexual activity.       Physical Exam     ?Constitutional: Well appearing, no acute distress  Eyes: No conjunctival injection, no scleral icterus  HENT: External nose and ears atraumatic, mucous membranes moist  Neck: Supple, trachea midline  Cardiovascular: RRR, no murmurs/rubs/gallops, no cyanosis  Pulmonary/Chest: Breathing comfortably on room air, equal bilateral chest rise, CTAB, no crackles/wheezes/rhonchi  Abdominal: Non-distended, soft, non-tender, no rebound/guarding  Musculoskeletal: Atraumatic, extremities without gross deformity  Neurologic: A/Ox3, no facial droop, moving all extremities willfully, answering questions appropriately   Skin: Warm & dry  Psychiatric: Normal affect & behavior    Triage vitals noted      Medical Decision Making   Andrew [name] is a 68 y.o. male with hx of aortic valve replacement 2018 presenting for Chest pain and shortness of breath x2 weeks.  Vital signs are stable.  Labs notable for troponin of 15, 15.  EKG with normal sinus, stable from prior, and without signs of active ischemia. At this time, low suspicion for acute cardiopulmonary  emergencies including ACS, CHF or COPD exacerbations, PE, pneumothorax, dissection. Lower suspicion for sever signs of aortic insufficiency or outflow insufficiencies given no syncope, normal vital signs. Will send for cards f/u for expedited outpatient echo.    - cardiac labs  - EKG, cxr  - dc with cardiology f/u per CM       ED Course/ Progress Notes / Reassessments     ED Course as of 07/31/23 0113   Tue Jul 30, 2023   1710 EKG stable from 2022 [WS]   2144 EKG appears similar to prior. St elevations in 3, AVF, depression in AVL. NSR rate 66.  [LC]   2220 Chest x-ray   Trace left pleural effusion with flattening of the left hemidiaphragm. No focal consolidation.   [TT]   2220 Tests ordered/reviewed:   Electrolytes within normal limits   No elevated white blood cell count   Normal hemoglobin   Lfts within normal limits   Ca mag phos normal  Troponin   [TT]   Wed Jul 31, 2023   0106 High Sensitivity Troponin I(!): 15 [LC]      ED Course User Index  [LC] Mirta Ammon., MD  [TT] Glennette Lanius., MD  [WS] Elisha Guillaume, MD              Laboratory Results     Labs Reviewed   HS TROPONIN I + REFLEX IF >= 5 NG/L - Abnormal; Notable for the following components:       Result Value    High Sensitivity Troponin I 15 (*)     All other components within normal limits   HS TROPONIN I (REFLEXED) - Abnormal; Notable for the following components:    High Sensitivity Troponin I 15 (*)     All other components within normal limits   ELECTROLYTE PANEL - Normal   GLUCOSE - Normal   UREA NITROGEN - Normal   HEPATIC FUNCT PANEL - Normal   PHOSPHORUS - Normal   CALCIUM - Normal   MAGNESIUM - Normal   BNP - Normal   CBC   CREATININE,WHOLE BLOOD       Imaging Results     XR chest ap portable (1 view)   Preliminary Result by Mervyn Ace., MD (04/29 2211)   IMPRESSION:        Trace left pleural effusion with flattening of the left hemidiaphragm. No focal consolidation.            Dictated by: Delroy Fields   07/30/2023 10:11 PM          Consults     Consult Orders Placed This Encounter       None            Clinical Impressions           Chest pain, unspecified type (Primary)       Disposition and Follow-up     Disposition: Discharge [1]    Future Appointments   Date Time Provider Department Center   08/15/2023  2:45 PM Maggie Schooner, MD [facility] [id number] [place]/Cen   10/09/2023  2:00 PM [facility] ECHO 3-CARDIAC IMAGING CI [facility] [place]/Cen   10/09/2023  3:00 PM Vucicevic, Darko, MD [facility] 630 /Cen       Follow up with:  No follow-up provider specified.    Return precautions are specified on After Visit Summary.    New Prescriptions    No medications on file       Medications  Administered This Encounter       None                           Resident Signature          Mirta Ammon., MD  Resident  07/31/23 [phone]    ATTENDING NOTE    I was present with the resident during the key/critical portions of this service. I have discussed the management with the resident, have reviewed the resident note and agree with the documented findings and plan of care.        Katalina Magri K., MD  08/01/23 [phone]

## 2023-07-31 ENCOUNTER — Inpatient Hospital Stay
Admit: 2023-07-31 | Discharge: 2023-07-31 | Disposition: A | Discharge status: Home / Self Care | Payer: Commercial Managed Care - Pharmacy Benefit Manager | Source: Home / Self Care

## 2023-07-31 DIAGNOSIS — R079 Chest pain, unspecified: Secondary | ICD-10-CM

## 2023-07-31 LAB — Urea Nitrogen: UREA NITROGEN: 17 mg/dL (ref 7–22)

## 2023-07-31 LAB — Electrolyte Panel: SODIUM: 143 mmol/L (ref 135–146)

## 2023-07-31 LAB — CREATININE: ESTIMATED GFR 2021 CKD-EPI: >89 mL/min/{1.73_m2} (ref 0.60–1.30)

## 2023-07-31 LAB — Glucose, Whole Blood: GLUCOSE, WHOLE BLOOD: 91 mg/dL (ref 65–99)

## 2023-07-31 LAB — HS Troponin I + Reflex If >=  5 ng/L: HIGH SENSITIVITY TROPONIN I: 15 ng/L — ABNORMAL HIGH (ref <5)

## 2023-07-31 LAB — CBC: HEMOGLOBIN: 14.7 g/dL (ref 13.5–17.1)

## 2023-07-31 LAB — Phosphorus: PHOSPHORUS: 3.4 mg/dL (ref 2.5–4.5)

## 2023-07-31 LAB — Hepatic Funct Panel: ALKALINE PHOSPHATASE: 66 U/L (ref 37–133)

## 2023-07-31 LAB — Magnesium: MAGNESIUM: 1.9 meq/L (ref 1.4–1.9)

## 2023-07-31 LAB — B-Type Natriuretic Peptide: BNP: 36 pg/mL (ref <100)

## 2023-07-31 LAB — HS Troponin I (Reflexed): HIGH SENSITIVITY TROPONIN I: 15 ng/L — ABNORMAL HIGH (ref <5)

## 2023-07-31 LAB — Calcium: CALCIUM: 8.7 mg/dL (ref 8.6–10.4)

## 2023-07-31 NOTE — Discharge Instructions
 Emergency Department Discharge Instructions      Summary of your visit  You have been evaluated in the [facility] Emergency Department today for chest pain.  Your evaluation included a physical exam, chest X-ray, and EKG which were reassuring today.  We have observed you in the ER and have determined that you are stable for discharge at this time.  We have sent a referral for you to see a cardiologist in 1-2 weeks.  If you do not hear from them in a week, you can also call [phone] to make an appointment with a cardiologist at [facility].    Follow-Up  Please follow up with your primary care physician within three days after being discharged from the ER - you can call to schedule an appointment.  You can find a primary care physician at [facility] by calling [phone].    If you do not have a Primary Care Physician, please call your insurance company or you may call [phone] to establish care with a [facility] physician.  If you are uninsured, please call 2-1-1 to find a free or low-cost clinic in your area. 2-1-1 [place] is the central source for providing information and referrals for all health and human services in [facility]. Our 2-1-1 phone line is open 24 hours, 7 days a week, with trained [employer] prepared to offer help with any situation, any time. Our community services go far beyond phone referrals - explore our website to learn more. If you are calling from outside [facility] or cannot directly dial 2-1-1, you can call [phone].      Return to the Emergency Department if you experience:  Worsening or uncontrolled pain  Persistent nausea and vomiting  Shortness of breath  Dizziness or lightheadedness  Fainting  Any other concerning symptoms     Thank you for choosing [facility] for your care. It was a pleasure taking part in your care today, and we wish you the best!

## 2023-08-05 ENCOUNTER — Ambulatory Visit: Payer: Commercial Managed Care - Pharmacy Benefit Manager

## 2023-08-05 DIAGNOSIS — R079 Chest pain, unspecified: Secondary | ICD-10-CM

## 2023-08-05 NOTE — Progress Notes
 Internal Medicine Progress Note      Patient: Andrew [name], [id number]  DOB: 11-01-1955  Primary Care Provider: Maggie Schooner, MD  Date of Service: 08/05/2023    Chief Concern: No chief complaint on file.    History obtained from: [x]  patient []  family member []  caregiver      History of Present Illness   Andrew [name] is a 68 y.o. 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    Patient reports that has had multiple episodes of chest pain over the course of three weeks. He relates that the pain occurs at night time. He relates that he feels the pain prior to sleeping and is unable to state the duration. States that the pain resolves by the time that he wakes up. States that he checks his blood pressure during these episodes and that it is typically 130-140/80-90 and that it is typically 120/80. He describes it as a numbness which is present on the left side. He states that his pain is resolved by pressing on his chest, and feels the sensation of gas coming out. Denies any changes in pain with breathing. States that his pain is worse with lying down. Denies any previous chest pain such as this and states that it has resolved last night. [name] wife relates that he has become more short of breath recently. Patient relates he had to sleep with two pillows which he started doing for the past two weeks. Denies any lightheadedness or feeling like he is going to faint. Endorses occasional swelling of legs. Denies any regurgitation or vomiting. Wife endorses feeling as though he has had weight gain. Denies any recent travel.     Patient presented to [facility] ED on 4/29, reportedly had 2 weeks of left sided chest pain that nonexertional, lasts for a few hours.  Normally starts at night when he lays down. Denies any leg swelling, paroxysmal nocturnal dyspnea, orthopnea. Is wondering if he can get an echo to evaluate for possible new aortic valve. No recent travel or illnesses, no fever or chills.    At ED was found to have Vital signs are stable. Labs notable for troponin of 15, 15. EKG with normal sinus, stable from prior, and without signs of active ischemia. Discharged with cardiology follow up. Has cardiology follow up scheduled with Dr. Gillian Lacrosse on 08/09/2023, as well as regularly scheduled follow up with Dr. Anniece Base on 10/09/2023. Has Echo scheduled for 10/09/2023.     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)    Aortic stenosis    History of aortic valve replacement    Hypertension    Gout    Seborrheic dermatitis    Urinary frequency    CVA (cerebral vascular accident) (HCC/RAF)    H/O aortic valve replacement    Bicuspid aortic valve  Age related osteoporosis    Prediabetes       Health Maintenance   Topic Date Due    Shingles (Shingrix) Vaccine (1 of 2) Never done    Preventive Wellness Visit  Never done    Advance Directive  Never done    COVID-19 Vaccine(Tracks primary and booster doses, not sup/immunocomp) (4 - 2024-25 season) 12/02/2022    Colorectal Cancer Screening  07/16/2023    Prediabetes Screening (See hover text)  05/24/2026    Tdap/Td Vaccine (3 - Td or Tdap) 07/19/2032    Hepatitis B Screening  Completed    Influenza Vaccine  Completed    Pneumococcal Vaccine  Completed    Hepatitis C Screening  Completed    AAA Screening  Completed    Anti-platelet therapy  Completed Statin prescribed for ASCVD Prevention or Treatment  Completed         Home Medications     No outpatient medications have been marked as taking for the 08/05/23 encounter (Appointment) with Odessa Bene., MD.       Allergy   No Known Allergies    Physical Exam   Chaperone status: No data recorded      Vitals  There were no vitals taken for this visit.  Weight:   Wt Readings from Last 3 Encounters:   07/30/23 190 lb (86.2 kg)   09/07/22 191 lb 3.2 oz (86.7 kg)   07/20/22 189 lb (85.7 kg)      BMI Readings from Last 1 Encounters:   07/30/23 30.67 kg/m?     BP Trend:   BP Readings from Last 3 Encounters:   07/30/23 129/91   07/30/23 110/72   05/29/23 102/70         System Check if Normal Positive or additional negative findings   Constit  [x]  General appearance  Elderly male, not in acute distress   Eyes  []  Conj/Lids []  Pupils  []  Fundi     ENMT  []  External ears/nose []  Otoscopy   []  Hearing []  Nasal mucosa   []  Lips/teeth/gums []  Oropharynx     Neck  []  Inspection/palpation []  Thyroid     Resp  [x]  Normal effort [x]  No Wheezing    [x]  Auscultation  [] No Crackles  Diminished breath sounds at LLL   CV  [x]  RRR   []  No  Murmurs  []  No Edema   []  JVP non-elevated    Normal pulses:   []  Abd aorta []  Femoral  []  Pedal  Bilateral Trace edema, soft 2/6 flow murmur   Breast  []  Inspection []  Palpation breast and axilla     GI  []  No abd masses    []  No tenderness   []  No Liver/spleen enlargement    []  Rectal-no masses     GU  M: []  Scrotum []  Penis []  Prostate   F:  []  External []  Bladder []  Cervix         []  Uterus    []  Adnexa      Lymph  []  Neck []  Axillae []  Groin     MS Specify site examined:    []  Inspect/palp []  ROM   []  Stability []  Strength/tone         Skin  []  Inspection []  Palpation     Neuro  []  CN2-12 intact    []  Oriented x 3   []  Motor strength    []  Sensory intact to light touch   []  Patellar DTR  []   Brachial DTR          []  Normal Gait     Psych  []  Insight/judgement     []  Mood/affect Laboratory Data/ Imaging      (click to expand/collapse)    Lab Studies:  CBC:   Results for orders placed or performed in visit on 07/23/20   CBC   Result Value Ref Range    White Blood Cell Count 5.48 4.16 - 9.95 x10E3/uL    Red Blood Cell Count 5.41 4.41 - 5.95 x10E6/uL    Hemoglobin 15.9 13.5 - 17.1 g/dL    Hematocrit 16.1 09.6 - 52.0 %    Mean Corpuscular Volume 86.9 79.3 - 98.6 fL    Mean Corpuscular Hemoglobin 29.4 26.4 - 33.4 pg    MCH Concentration 33.8 31.5 - 35.5 g/dL    Red Cell Distribution Width-SD 40.8 36.9 - 48.3 fL    Red Cell Distribution Width-CV 13.1 11.1 - 15.5 %    Platelet Count, Auto 203 143 - 398 x10E3/uL    Mean Platelet Volume 11.2 9.3 - 13.0 fL    Nucleated RBC%, automated 0.0 No Ref. Range %    Absolute Nucleated RBC Count 0.00 0.00 - 0.00 x10E3/uL    Neutrophil Abs (Prelim) 2.63 See Absolute Neut Ct. x10E3/uL   Differential, Automated   Result Value Ref Range    Neutrophil Percent, Auto 48.0 No Ref. Range %    Lymphocyte Percent, Auto 37.8 No Ref. Range %    Monocyte Percent, Auto 10.6 No Ref. Range %    Eosinophil Percent, Auto 2.9 No Ref. Range %    Basophil Percent, Auto 0.5 No Ref. Range %    Immature Granulocytes% 0.2 No Reference Range %    Absolute Neut Count 2.63 1.80 - 6.90 x10E3/uL    Absolute Lymphocyte Count 2.07 1.30 - 3.40 x10E3/uL    Absolute Mono Count 0.58 0.20 - 0.80 x10E3/uL    Absolute Eos Count 0.16 0.00 - 0.50 x10E3/uL    Absolute Baso Count 0.03 0.00 - 0.10 x10E3/uL    Absolute Immature Gran Count 0.01 0.00 - 0.04 x10E3/uL   CBC & Plt & Diff    Narrative    The following orders were created for panel order CBC & Plt & Diff.  Procedure                               Abnormality         Status                     ---------                               -----------         ------                     EAV[[id number]]                                              Final result               Differential, Automated[[id number]]  Final result Please view results for these tests on the individual orders.      Results for orders placed or performed during the hospital encounter of 07/30/23   CBC without differential   Result Value Ref Range    White Blood Cell Count 5.44 4.16 - 9.95 x10E3/uL    Red Blood Cell Count 5.16 4.41 - 5.95 x10E6/uL    Hemoglobin 14.7 13.5 - 17.1 g/dL    Hematocrit 16.1 09.6 - 52.0 %    Mean Corpuscular Volume 86.4 79.3 - 98.6 fL    Mean Corpuscular Hemoglobin 28.5 26.4 - 33.4 pg    MCH Concentration 33.0 31.5 - 35.5 g/dL    Red Cell Distribution Width-SD 42.2 36.9 - 48.3 fL    Red Cell Distribution Width-CV 13.4 11.1 - 15.5 %    Platelet Count, Auto 192 143 - 398 x10E3/uL    Mean Platelet Volume 10.7 9.3 - 13.0 fL    Nucleated RBC%, automated 0.0 No Ref. Range %    Absolute Nucleated RBC Count 0.00 0.00 - 0.00 x10E3/uL     CMP:   Results for orders placed or performed in visit on 05/25/23   Comprehensive Metabolic Panel   Result Value Ref Range    Sodium 137 135 - 146 mmol/L    Potassium 3.8 3.6 - 5.3 mmol/L    Chloride 100 96 - 106 mmol/L    Total CO2 28 20 - 30 mmol/L    Anion Gap 9 8 - 19 mmol/L    Glucose 104 (H) 65 - 99 mg/dL    Creatinine 0.45 4.09 - 1.30 mg/dL    Estimated GFR >81 See GFR Additional Information mL/min/1.54m2    GFR Additional Information See Comment     Urea Nitrogen 12 7 - 22 mg/dL    Calcium 8.9 8.6 - 19.1 mg/dL    Total Protein 7.5 6.1 - 8.2 g/dL    Albumin 4.3 3.9 - 5.0 g/dL    Bilirubin,Total 0.6 0.1 - 1.2 mg/dL    Alkaline Phosphatase 67 37 - 133 U/L    Aspartate Aminotransferase 24 13 - 62 U/L    Alanine Aminotransferase 27 8 - 70 U/L     Hgb A1C:   Lab Results   Component Value Date/Time    HGBA1C 6.4 (H) 05/25/2023 09:19 AM     Lipids:   Results for orders placed or performed in visit on 05/25/23   Lipid Panel   Result Value Ref Range    Cholesterol 123 See Comment mg/dL    Cholesterol,LDL,Calc 68 <100 mg/dL    Cholesterol, HDL 38 (L) >40 mg/dL    Triglycerides 84 <478 mg/dL Non-HDL,Chol,Calc 85 <295 mg/dL      TSH: No results found for: ''TSH''  Imaging & Procedures     Imaging Studies:   Last CXR, PA/L: XR chest ap portable (1 view)  Result Date: 07/31/2023  IMPRESSION:  Trace left pleural effusion. No definite consolidation. I, Ninetta Basket, M.D., have reviewed this radiological study personally and I am in full agreement with the findings of the report presented here. Dictated by: Delroy Fields   07/30/2023 10:11 PM Signed by: Ninetta Basket   07/31/2023 4:19 AM  , Last TTE:   Results for orders placed or performed during the hospital encounter of 09/07/22   Echo adult transthoracic complete   Result Value Ref Range    Left Ventricular Ejection Fraction 62.5 %    Narrative    TRANSTHORACIC ECHOCARDIOGRAM REPORT  Patient Name:   Andrew [name] Date of Exam:     09/07/2022  Medical Rec #:  [id number]        Patient Type:     OP  Accession #:    [id number]       [facility]  Date of Birth:  11-10-1955      Height:           66.0 in  Patient Age:    52 years       Weight:           189.0 lb  Patient Gender: M              BSA:              1.95 m                                 BP:               133/88     Diagnosis:  Z95.2-S/P AVR (aortic valve replacement); Q23.1-Bicuspid aortic              valve  Indication: Hx of Bicuspid aortic valve replacement 2013, HTN, CVA, chronic ST              elevation in inferior leads  Referring Provider: [id number] [facility] VUCICEVIC  Sonographer 1:      Malvin Searing RDCS     Report prepared/completed by Malvin Searing RDCS on 09/07/2022 at 10:04:46 AM  Other Study Information: Fair image quality. Technically challenging study                           related to the patients body habitus.     --------------------------------------------------------------------------    Impression    :     1. Normal LV systolic function. Normal left ventricular regional wall motion.  Left ventricular ejection fraction is approximately 60-65%.  2. Small LV size and moderate concentric left ventricular hypertrophy.  3. Mild LV diastolic dysfunction (grade I, impaired relaxation).  4. Not well seen right ventricular size and normal systolic function.  5. Normal atrial sizes.  6. A 25 mm Carpentier bioprosthetic valve is present in the aortic valve  position. Echo findings are consistent with normal structure and function of  the aortic prosthesis with mean gradient of 14 mmHg and AVA 1.26cm2.  7. Trace mitral valve regurgitation.  8. Trace to mild tricuspid valve regurgitation.  9. Tricuspid regurgitant jet inadequate to assess pulmonary artery systolic  pressure.  10. Normal mean pulmonary artery pressure by PVAT.  11. Mildly enlarged mid ascending aorta (4.1 cm).  12. A prior echo performed on 12/29/2021 was reviewed for comparison.  Carpentier valve measurements and findings as above similar to prior study.     --------------------------------------------------------------------------  FINDINGS:     LEFT VENTRICLE: Small left ventricular size. Moderate concentric left ventricular hypertrophy. Normal systolic function. Visually estimated left ventricular ejection fraction 60-65%. Mild LV diastolic dysfunction (grade I, impaired relaxation).  RIGHT VENTRICLE: Not well seen right ventricular size and normal systolic function. TAPSE 1.7 cm. DTI 10.7 cm/s.  LEFT ATRIUM: Normal left atrial size. Pulmonary vein spectral Doppler profile consistent with normal left atrial pressure.  RIGHT ATRIUM: Normal right atrial size. Right atrial pressure is estimated at 8 mmHg.  MITRAL VALVE: Not well seen. Mitral valve annular calcification is present.  Trace mitral valve regurgitation.  TRICUSPID VALVE: Trace tricuspid valve regurgitation. Tricuspid regurgitation velocity is not well seen.  AORTIC VALVE: A 25 mm Carpentier bioprosthetic valve is present in the aortic valve position. Echo findings are consistent with normal structure and function, allowing for the limitations of transthoracic echo techniques of the aortic prosthesis. LVOT   stroke volume index 35 ml/m consistent with low flow. Normal prosthetic valve function. The aortic valve area is 1.26 cm (Indexed AVA is 0.65 cm/m). The aortic valve maximum velocity is 2.4 m/s. The peak gradient across the aortic valve is 23 mmHg. The   mean gradient is 14 mmHg. No evidence of aortic valve regurgitation. No evidence of paravalvular aortic valve regurgitation.  PULMONIC VALVE: The pulmonic valve was not well visualized. Trace pulmonic valve regurgitation. Normal mean pulmonary artery pressure by RVOT spectral signal.  AORTA: Normal descending aorta and normal aortic arch. Mildly enlarged mid ascending aorta (4.1 cm).  SYSTEMIC VEINS: The inferior vena cava is normal in size and exhibits less than 50% respiratory change.  PERICARDIUM: There is no evidence of pericardial effusion.     2D AND M-MODE MEASUREMENTS:  Left Ventricle:  IVSd (2D):      1.4 cm  LVPWd (2D):     1.3 cm  LVIDd (2D):     4.0 cm  LV mass         202  LV SYSTOLIC FUNCTION BY 2D PLANIMETRY (MOD):  EF-A4C View: 61 % EF-A2C View: 72 % EF-Biplane: 66 %  LV Vol A4C:      EDV 83 ml ESV 32 ml  LV Vol A2C:      EDV 72 ml ESV 20 ml  LV Vol: Biplane: EDV 77 ml ESV 26 ml  LV DIASTOLIC FUNCTION:  MV Peak E: 76 cm/s  e' medial   4.4  MV Peak A: 113 cm/s e' lateral  4.5  E/A Ratio: 0.7      e' average  4.4                      Decel Time: 315 msec     Right Ventricle:  TAPSE:           1.7 cm  RA Volumes:  RA VOL A4C: 44 ml RA Vol A4C index: 23 ml/m RA area:  LA Volumes:  LA Vol A4C: 59 ml LA Vol A4C index: 30 ml/m LA area:  Aortic Valve: AoV Max Vel: 2.4 m/s AoV Peak PG: 23 mmHg AoV Mean PG: 14 mmHg  LVOT Vmax: 0.90 m/s LVOT VTI: 20 cm AoV VTI:       54 cm                                      LVOT Diameter: 2.1 cm  AoV Area, Vmax: 1.29 cm AoV Area, VTI: 1.26 cm                           AoV area index 0.65 cm/m  Dimensionless index 0.36  Tricuspid Valve and PA/RV Systolic Pressure:  RA Pressure: 8 mmHg  Pulmonic Valve:  PV Max Velocity: 0.7 m/s  PV Max PG: 2 mmHg PV Mean PG:  PV AT:           134 msec  Aorta:  Ao Asc: 4.1 cm  Systemic Veins:  IVC Diameter: 1.66  cm     [id number] Patti Border MD  Electronically signed by [id number] Patti Border MD on 09/07/2022 at 10:28:15 AM     cc: [id number] [facility] VUCICEVIC      Final    , and Imaging in last year, IMP only: XR chest ap portable (1 view)  Result Date: 07/31/2023  IMPRESSION:  Trace left pleural effusion. No definite consolidation. I, Ninetta Basket, M.D., have reviewed this radiological study personally and I am in full agreement with the findings of the report presented here. Dictated by: Delroy Fields   07/30/2023 10:11 PM Signed by: Ninetta Basket   07/31/2023 4:19 AM    US  abd aorta screening non-vascular  Result Date: 05/02/2023  IMPRESSION: No abdominal aortic aneurysm. Signed by: Simin Bahrami   05/02/2023 10:00 AM    Echo adult transthoracic complete  Result Date: 09/07/2022  :  1. Normal LV systolic function. Normal left ventricular regional wall motion. Left ventricular ejection fraction is approximately 60-65%. 2. Small LV size and moderate concentric left ventricular hypertrophy. 3. Mild LV diastolic dysfunction (grade I, impaired relaxation). 4. Not well seen right ventricular size and normal systolic function. 5. Normal atrial sizes. 6. A 25 mm Carpentier bioprosthetic valve is present in the aortic valve position. Echo findings are consistent with normal structure and function of the aortic prosthesis with mean gradient of 14 mmHg and AVA 1.26cm2. 7. Trace mitral valve regurgitation. 8. Trace to mild tricuspid valve regurgitation. 9. Tricuspid regurgitant jet inadequate to assess pulmonary artery systolic pressure. 10. Normal mean pulmonary artery pressure by PVAT. 11. Mildly enlarged mid ascending aorta (4.1 cm). 12. A prior echo performed on 12/29/2021 was reviewed for comparison. Carpentier valve measurements and findings as above similar to prior study.  -------------------------------------------------------------------------- FINDINGS:  LEFT VENTRICLE: Small left ventricular size. Moderate concentric left ventricular hypertrophy. Normal systolic function. Visually estimated left ventricular ejection fraction 60-65%. Mild LV diastolic dysfunction (grade I, impaired relaxation). RIGHT VENTRICLE: Not well seen right ventricular size and normal systolic function. TAPSE 1.7 cm. DTI 10.7 cm/s. LEFT ATRIUM: Normal left atrial size. Pulmonary vein spectral Doppler profile consistent with normal left atrial pressure. RIGHT ATRIUM: Normal right atrial size. Right atrial pressure is estimated at 8 mmHg. MITRAL VALVE: Not well seen. Mitral valve annular calcification is present. Trace mitral valve regurgitation. TRICUSPID VALVE: Trace tricuspid valve regurgitation. Tricuspid regurgitation velocity is not well seen. AORTIC VALVE: A 25 mm Carpentier bioprosthetic valve is present in the aortic valve position. Echo findings are consistent with normal structure and function, allowing for the limitations of transthoracic echo techniques of the aortic prosthesis. LVOT stroke volume index 35 ml/m consistent with low flow. Normal prosthetic valve function. The aortic valve area is 1.26 cm (Indexed AVA is 0.65 cm/m). The aortic valve maximum velocity is 2.4 m/s. The peak gradient across the aortic valve is 23 mmHg. The mean gradient is 14 mmHg. No evidence of aortic valve regurgitation. No evidence of paravalvular aortic valve regurgitation. PULMONIC VALVE: The pulmonic valve was not well visualized. Trace pulmonic valve regurgitation. Normal mean pulmonary artery pressure by RVOT spectral signal. AORTA: Normal descending aorta and normal aortic arch. Mildly enlarged mid ascending aorta (4.1 cm). SYSTEMIC VEINS: The inferior vena cava is normal in size and exhibits less than 50% respiratory change. PERICARDIUM: There is no evidence of pericardial effusion.  2D AND M-MODE MEASUREMENTS: Left Ventricle: IVSd (2D):      1.4 cm LVPWd (2D):     1.3 cm LVIDd (2D):  4.0 cm LV mass         202 LV SYSTOLIC FUNCTION BY 2D PLANIMETRY (MOD): EF-A4C View: 61 % EF-A2C View: 72 % EF-Biplane: 66 % LV Vol A4C:      EDV 83 ml ESV 32 ml LV Vol A2C:      EDV 72 ml ESV 20 ml LV Vol: Biplane: EDV 77 ml ESV 26 ml LV DIASTOLIC FUNCTION: MV Peak E: 76 cm/s  e' medial   4.4 MV Peak A: 113 cm/s e' lateral  4.5 E/A Ratio: 0.7      e' average  4.4                     Decel Time: 315 msec  Right Ventricle: TAPSE:           1.7 cm RA Volumes: RA VOL A4C: 44 ml RA Vol A4C index: 23 ml/m RA area: LA Volumes: LA Vol A4C: 59 ml LA Vol A4C index: 30 ml/m LA area: Aortic Valve: AoV Max Vel: 2.4 m/s AoV Peak PG: 23 mmHg AoV Mean PG: 14 mmHg LVOT Vmax: 0.90 m/s LVOT VTI: 20 cm AoV VTI:       54 cm                                     LVOT Diameter: 2.1 cm AoV Area, Vmax: 1.29 cm AoV Area, VTI: 1.26 cm                          AoV area index 0.65 cm/m Dimensionless index 0.36 Tricuspid Valve and PA/RV Systolic Pressure: RA Pressure: 8 mmHg Pulmonic Valve: PV Max Velocity: 0.7 m/s  PV Max PG: 2 mmHg PV Mean PG: PV AT:           134 msec Aorta: Ao Asc: 4.1 cm Systemic Veins: IVC Diameter: 1.66 cm  [id number] Patti Border MD Electronically signed by [id number] Patti Border MD on 09/07/2022 at 10:28:15 AM  cc: [id number] [facility] VUCICEVIC   Final         Assessment & Problem-Based Plan   Andrew [name] is a 68 y.o. male presenting for follow up.         Diagnoses and all orders for this visit:    History of aortic valve replacement       #Chest pain  #Hx of Bioprosthetic SAVR (04/2011)  - Patient evaluated in [facility] ED on 4/29  - EKG 07/2023 - NSR with possible LAE, RBBB, and ST-elevations in inferior leads w/o concominant depressions, (present on priors)  - Troponin 4/30: 15  - Echo 09/2022 - EF 60%, G1 DD, Mild concentric LVH, bioprostheric valve   - CXR 4/29: Left pleural effusion  - Unclear etiology of chest pain, troponin and EKG reassuring, query bioprosthetic valve degeneration given age of valve, however was functioning properly on echo from one year ago. Chest pain may also be a result of pleural effusion (which may be a result of valvular dysfunction).  PLAN:  - Encouraged patient to keep cardiology and echo appointment (encouraged patient to move appointment up if possible).  - Ordered BNP, repeat Chest X-ray  - Educated on return precautions such as increased shortness of breath, chest pain at rest, syncope      RTC:   No follow-ups on file.    The above diagnosis, plan, orders, and follow-up  were discussed with the patient. See After-Visit Summary for additional information and counseling materials provided to the patient.    This patient was evaluated and treated under the supervision of the attending, Dr. Debrah Fan.     Author:  Odessa Bene, MD  [facility] Internal Medicine Resident  08/05/2023

## 2023-08-05 NOTE — Patient Instructions
 A note from your doctor:    Thank you for choosing [facility] for your care.    Our plan, as discussed today:    Please schedule to get echo done earlier  Please get chest xray and labs done  Please follow up with cardiology  Please call and have your ECHO test moved up. Please call  [facility] at [phone] (if you cannot get it scheduled in next 1-2 weeks, let me know)      Your return appointment should be scheduled for No follow-ups on file.    Follow-up: Checkout staff will schedule your follow-up visit.   If you prefer to schedule at a later time, you can call our Call Center at [phone] or request an appointment online through [facility].   --------------------------------------------------------------------------------------------------------------------    Frequently asked questions:    1. How do I return for laboratory tests?  [facility] [employer] [facility] [employer], [address]):   Monday to Friday (6:00 am to 7:00 pm)   Weekends and holidays (7:00 am to 3:30 pm)  [facility] [phone] [address]):  Monday to Friday (8:00 am to 6:00 pm)  On request, we can send to outside labs (e.g. [facility] or [facility])  For other [facility] labs, see: [email or web address]    2. How do I get my test results from the visit?  If you sign up for [facility] online ([email or web address]), we will release test results online with comments once your test results are available, usually within 1 week. Some specialized test results may take several weeks.  If you are not signed up for [facility] when your results become available, we will send your test results by letter within 1-2 weeks of your visit.    If another doctor orders a test for you, it is best to speak first with that doctor directly about the result.    3. How can I reach my doctor after the visit?  For non-emergency contact, you may reach your doctor two ways:  Online: send non-urgent medical questions through the [facility] website ([email or web address]). These are usually returned within 2 business days.  Call: the [facility] Internal Medicine Call Center at [phone] during business hours to leave a message (or schedule a return appointment).    4. How do I get after-hours care?    For serious and life-threatening concerns, call 911.  Weekdays: Call during business hours to request an urgent appointment: [phone].   Evenings/weekends: Our colleagues offer access to after-hours care at our walk-in clinic:    [facility]  [address] [place] [address], [place] [address]   [phone]  Monday - Friday: 8:00am-9:00pm  Saturday - Sunday and Holidays: 9:00am-5:00pm  [email or web address]    [facility] has additional urgent care sites, see locations at:    [email or web address]

## 2023-08-06 ENCOUNTER — Inpatient Hospital Stay: Payer: Commercial Managed Care - Pharmacy Benefit Manager

## 2023-08-06 MED ORDER — PANTOPRAZOLE SODIUM 20 MG PO TBEC
20 mg | ORAL_TABLET | Freq: Every day | ORAL | 0 refills | 30.00000 days | Status: AC
Start: 2023-08-06 — End: ?

## 2023-08-08 ENCOUNTER — Inpatient Hospital Stay: Payer: Commercial Managed Care - Pharmacy Benefit Manager | Attending: Cardiovascular Disease

## 2023-08-08 DIAGNOSIS — Z952 Presence of prosthetic heart valve: Secondary | ICD-10-CM

## 2023-08-09 ENCOUNTER — Ambulatory Visit: Payer: Commercial Managed Care - Pharmacy Benefit Manager

## 2023-08-09 DIAGNOSIS — Z8774 Personal history of (corrected) congenital malformations of heart and circulatory system: Secondary | ICD-10-CM

## 2023-08-09 DIAGNOSIS — Z8673 Personal history of transient ischemic attack (TIA), and cerebral infarction without residual deficits: Secondary | ICD-10-CM

## 2023-08-09 DIAGNOSIS — Z952 Presence of prosthetic heart valve: Secondary | ICD-10-CM

## 2023-08-09 DIAGNOSIS — R0789 Other chest pain: Secondary | ICD-10-CM

## 2023-08-09 DIAGNOSIS — I10 Essential (primary) hypertension: Secondary | ICD-10-CM

## 2023-08-09 NOTE — Consults
 Outpatient Cardiology Consultation    PATIENT: Andrew [name]  MRN: [id number]  DOB: 09-01-1955  DATE OF SERVICE: 08/09/2023      PRIMARY CARE PROVIDER: Maggie Schooner, MD        REASON FOR CONSULTATION:   Chief Complaint   Patient presents with    [facility] Discharge Follow Up       HISTORY OF PRESENT ILLNESS:  Andrew [name] is a 68 y.o. male with past medical history significant for bicuspid aortic valve status post aortic valve replacement in January of 2013 with perioperative CVA, chronic ST elevation in inferior leads, hypertension.  He was referred to our Cardiology and has been managed by Dr. Anniece Base.  He was seen in [place] and referred to ER on 07/30/2023 for presents with Chest Pain (chest pain x 2 weeks, worsens at night, SOB, weakness, elevated BP at home.  Workup was unrevealing with troponin flat at 15 x2, BNP normal, and EKG stable. CXR with query pleural effusion, with repeat CXR on 08/06/2023 with finding felt to be consistent with extrapleural fat and not effusion.  He denies difficulty with breathing.     Reports elevated BP at home by wife, but all BP check with PCP, ER, and in cardiology clinic with systolic between 454-098 mmHg.  He denies any further CP.  He has not had orthopnea or PND.  Denies palpitation, dizziness, or syncope.    Echo done yesterday at [facility] clinic, not finalized. I personally reviewed the echocardiogram and deemed it to be essentially unchanged without any change in prosthetic aortic valve with stable mean gradient at 11 mmHg.           PAST MEDICAL HISTORY:  Past Medical History:   Diagnosis Date    CVA (cerebral vascular accident) (HCC/RAF)     H/O aortic valve replacement     Kidney stone     Urinary frequency        PAST SURGICAL HISTORY:  Past Surgical History:   Procedure Laterality Date    AORTIC VALVE REPLACEMENT         OUTPATIENT MEDICATIONS:  Outpatient Medications Prior to Visit   Medication Sig    ALLOPURINOL 100 mg tablet TAKE 2 TABLETS BY MOUTH EVERY DAY AMLODIPINE 5 mg tablet TAKE 1 TABLET (5 MG TOTAL) BY MOUTH DAILY.    amoxicillin 500 mg capsule Prior to dentalappointment.    ascorbic acid 500 mg tablet Take 1 tablet (500 mg total) by mouth daily.    aspirin 81 mg EC tablet Take 1 tablet (81 mg total) by mouth daily.    ATORVASTATIN 20 mg tablet TAKE 1 TABLET BY MOUTH EVERYDAY AT BEDTIME    CALCIUM PO Take 500 mg by mouth two (2) times daily.    carvedilol 6.25 mg tablet Take 1 tablet (6.25 mg total) by mouth two (2) times daily.    FOLIC ACID PO Take 1,000 mcg by mouth daily.    losartan 25 mg tablet Take 1 tablet (25 mg total) by mouth two (2) times daily.    Multiple Vitamins-Minerals (HCA SUPER THERAVITE-M PO) Take by mouth daily.    pantoprazole 20 mg DR tablet Take 1 tablet (20 mg total) by mouth daily.     No facility-administered medications prior to visit.           ALLERGIES:  No Known Allergies    SOCIAL HISTORY:  Social History     Socioeconomic History    Marital status: Married   Tobacco Use  Smoking status: Former     Current packs/day: 0.00     Average packs/day: 1 pack/day for 15.0 years (15.0 ttl pk-yrs)     Types: Cigarettes     Start date: 05/03/1975     Quit date: 05/02/1990     Years since quitting: 33.2    Smokeless tobacco: Never   Substance and Sexual Activity    Alcohol use: Not Currently     Comment: occasionally has red wine    Drug use: Not Currently       FAMILY HISTORY:  Family History   Problem Relation Age of Onset    No Known Problems Mother     Other (gastric ulcer) Father     Uterine cancer Sister     Stroke Brother     Kidney failure Brother     Colon cancer Neg Hx     Lung cancer Neg Hx     Prostate cancer Neg Hx        REVIEW OF SYSTEMS:  Per HPI, or chronic issues.    PHYSICAL EXAMINATION:  VITALS: BP 110/73  ~ Pulse 71  ~ Temp 36.1 ?C (96.9 ?F) (Forehead)  ~ Resp 18  ~ Ht 5' 6'' (1.676 m)  ~ Wt 202 lb (91.6 kg)  ~ SpO2 95%  ~ BMI 32.60 kg/m?    Wt Readings from Last 3 Encounters:   08/09/23 202 lb (91.6 kg)   08/05/23 202 lb (91.6 kg)   07/30/23 190 lb (86.2 kg)     General: well developed well nourished male in no acute distress  HEENT: extraocular movements intact, anicteric, oropharynx clear, moist mucous membranes  Neck: no jugular venous distention. No lymphadenopathy  Heart: regular rate and rhythm, 2/6 systolic murmur best heart at the RUSB  Lungs: clear to auscultation bilaterally, no retractions  Abd: soft, non tender, non distended, normoactive bowel sounds, no hepatosplenomegaly  Ext: no clubbing, cyanosis, or edema  Skin: warm, dry , and well perfused  Neuro: left leg weakness weakness with brace, otherwise non-focal.         LABORATORY:  Lab Results   Component Value Date    WBC 5.44 07/30/2023    NEUTABS 2.63 07/23/2020    HGB 14.7 07/30/2023    HCT 44.6 07/30/2023    PLT 192 07/30/2023    NEUTPCT 48.0 07/23/2020    LYMPHPCT 37.8 07/23/2020    MONOPCT 10.6 07/23/2020     Results for orders placed or performed in visit on 05/25/23   Comprehensive Metabolic Panel   Result Value Ref Range    Sodium 137 135 - 146 mmol/L    Potassium 3.8 3.6 - 5.3 mmol/L    Chloride 100 96 - 106 mmol/L    Total CO2 28 20 - 30 mmol/L    Anion Gap 9 8 - 19 mmol/L    Glucose 104 (H) 65 - 99 mg/dL    Creatinine 5.40 9.81 - 1.30 mg/dL    Estimated GFR >19 See GFR Additional Information mL/min/1.66m2    GFR Additional Information See Comment     Urea Nitrogen 12 7 - 22 mg/dL    Calcium 8.9 8.6 - 14.7 mg/dL    Total Protein 7.5 6.1 - 8.2 g/dL    Albumin 4.3 3.9 - 5.0 g/dL    Bilirubin,Total 0.6 0.1 - 1.2 mg/dL    Alkaline Phosphatase 67 37 - 133 U/L    Aspartate Aminotransferase 24 13 - 62 U/L    Alanine Aminotransferase 27 8 -  70 U/L     Lab Results   Component Value Date    BUN 17 07/30/2023    BUN 16 03/11/2012    CREAT 0.72 07/30/2023    NA 143 07/30/2023    K 3.9 07/30/2023    CL 103 07/30/2023    CO2 23 07/30/2023     Lab Results   Component Value Date    ALT 31 07/30/2023    AST 25 07/30/2023     No results found for: ''INR'', ''INRPOC'', ''PT''  Lab Results   Component Value Date    HGBA1C 6.4 (H) 05/25/2023     Lab Results   Component Value Date    CHOL 123 05/25/2023    CHOL 111 03/11/2012    CHOLHDL 38 (L) 05/25/2023    CHOLDLQ 61 03/11/2012    CHOLDLCAL 68 05/25/2023    TRIGLY 84 05/25/2023     No results found for: ''TSH'', ''T4TOTAL'', ''T4INDX'', ''T4AUTO'', ''[id number]'', ''T3TOTAL'', ''T3AUTO'', ''T3INDX'', ''T3UP''    Troponin 07/30/2023: 15 x2      DIAGNOSTIC STUDIES:  ECG 07/30/2023: NSR, RBBB, Inferior ST segment elevation in inferior leads.  When compared to prior ECG, unchanged.      ECHO 04/20/2019:  1. Left ventricle is normal in size with mild hypertrophy. Left ventricular   function is preserved and visually estimated to be 60-65%. There are no wall   motion abnormalities.   2. Normal biatrial size.   3. Normal right ventricular size and systolic function.   4. Bioprosthetic valve (25-mm Carpentier-Edwards Magna Ease ; 1/13; Dr. Gorge Laud)   is well seated in the aortic position. Peak velocity of 2.26 m/sec, mean   gradient of 11 mmHg, DI of 0.46, effective orifice area of 1.44 cm^2 (based   on LVOT diameter of 2cm)--consistent with normal prosthetic function.   5. Lack of an adequate TR signal precludes accurate estimation of right   ventricular systolic pressure. IVC diameter is = 2.1 cm with a > 50%   inspiratory collapse, suggestive of a right atrial pressure of 0-5 mmHg     ECHO 09/21/2020:   1. Normal LV size, mild septal hypertrophy, normal systolic function, normal LV diastolic function.   2. Left ventricular ejection fraction is approximately 60 to 65%.   3. Normal right ventricular size and normal systolic function.   4. A 25 mm Carpentier bioprosthetic valve is present in the aortic valve position. Echo findings are consistent with normal structure and function of the aortic prosthesis.   5. Normal pulmonary artery systolic pressure.   6. There are no prior echocardiograms available for comparison.     ECHO 09/06/2021:   1. Normal LV size, mild septal hypertrophy, normal wall motion and systolic function.   2. Left ventricular ejection fraction is approximately 60 to 65%.   3. Mild LV diastolic dysfunction (grade I, impaired relaxation).   4. Normal right ventricular size and grossly normal systolic function.   5. A 25 mm Carpentier bioprosthetic valve is present in the aortic valve position. It appears well seated. No paravalvular regurgitation. Peak aortic velocity is 1.9 m/sec and mean gradient is 8.0 mm Hg. By the continuity equation, the aortic valve area  is 1.74 cm? (Indexed AVA is 0.93 cm?/m?).   6. Upper normal aortic root (3.8 cm). Mildly enlarged mid ascending aorta (4.2 cm).   7. No significant valvular regurgitation.   8. Tricuspid regurgitant jet inadequate to assess pulmonary artery systolic pressure.   9. A prior echo performed on  09/21/2020 was reviewed for comparison. No significant changes noted since the previous study.    ECHO 12/29/2021:   1. Normal LV size, mild concentric left ventricular hypertrophy, normal systolic function, indeterminate diastolic function.   2. Left ventricular ejection fraction is approximately 60 to 65%.   3. Normal right ventricular size and normal systolic function.   4. Status post Carpentier bioprosthetic placement in the aortic valve position without regurgitation. Echo findings are consistent with normal structure and function of the aortic prosthesis. The aortic valve area is 1.39 cm? (Indexed AVA is 0.74 cm?/m?).   5. Trace tricuspid regurgitation and normal pulmonary artery systolic pressure.   6. A prior echo performed on 09/06/2021 was reviewed for comparison. The mean AV gradient has increased from 8 mmHg to 12 mmHg. Aortic valve was 1.74 cm2 in the previous study.     ECHO 09/07/2022  1. Normal LV systolic function. Normal left ventricular regional wall motion.  Left ventricular ejection fraction is approximately 60-65%.  2. Small LV size and moderate concentric left ventricular hypertrophy.  3. Mild LV diastolic dysfunction (grade I, impaired relaxation).  4. Not well seen right ventricular size and normal systolic function.  5. Normal atrial sizes.  6. A 25 mm Carpentier bioprosthetic valve is present in the aortic valve  position. Echo findings are consistent with normal structure and function of  the aortic prosthesis with mean gradient of 14 mmHg and AVA 1.26cm2.  7. Trace mitral valve regurgitation.  8. Trace to mild tricuspid valve regurgitation.  9. Tricuspid regurgitant jet inadequate to assess pulmonary artery systolic  pressure.  10. Normal mean pulmonary artery pressure by PVAT.  11. Mildly enlarged mid ascending aorta (4.1 cm).  12. A prior echo performed on 12/29/2021 was reviewed for comparison.  Carpentier valve measurements and findings as above similar to prior study.    Echo 08/08/2023: independently reviewed by me (echo formally not read at time of this notation at [facility] practice):  Normal LV size and function.  Normal appearance of bioprosthetic aortic valve, mean gradient 11 mmHg.  Unchanged from prior echo.     CXR 08/05/2023  Stable cardiomediastinal silhouette. Aortic valve replacement.  Minimal blunting of the costophrenic angles, likely related to extrapleural fat adipose tissue as noted on prior CT. Mild elevation of the left hemidiaphragm.  Mild vascular congestion. Bibasilar atelectasis.  Median sternotomy wires.         CT chest 09/21/2020:  Postoperative changes of aortic valve replacement. Ectasia of the ascending aorta measures up to 3.9 cm in the proximal arch grossly unchanged since 2013        ASSESSMENT/PLAN:  Andrew [name] is a 67 y.o. male with:  1. Atypical chest pain  --Trop not significant at 15 x2.  ECG with baseline ST elevation in inferior leads which he has had for many years, unchanged.  BNP was normal.  CXR in ER initially called for small effusion, but on subsequent CXR, when compared to prior CT, the blunting of costophrenic angle was felt to be due to pleural fat and not effusion.  No clear reason for chest pain from cardiovascular perspective.      2. H/O aortic valve replacement (Primary)  In 2013, bioprosthetic.  Echo from 08/08/2023 done at [facility] office, not finalized, but I have personally reviewed.  Normal prosthetic function. Mean gradient 11 mmHg.    3. H/O bicuspid aortic valve  --s/p AVR per above.     4. Primary hypertension  --reported higher  levels at home, but always in clinic visits and ER in excellent range.  --continue Amlodipine 5 mg and losartan 25 mg daily.    5. H/O: CVA (cerebrovascular accident)  With stable residual deficit.  On stable dose of atorvastatin 20 mg, to be continued.    Return precautions provided.  Patient has follow up with Dr. Anniece Base in July, which encouraged patient to attend.         50 minutes were spent personally by me today on this encounter which include today?s pre-visit review of the chart, obtaining appropriate history, performing an evaluation, documentation and discussion of management with details supported within the note for today?s visit. The time documented was exclusive of any time spent on the separately billed procedure.      Angelina Bare, MD, [facility]  Associate Clinical Professor  [facility]  [address] [place], [address] [place]  Telephone: [phone]  Fax: [phone]  Email: [email or web address]    Author: Angelina Bare, MD 08/09/2023 8:12 PM

## 2023-08-13 ENCOUNTER — Other Ambulatory Visit: Payer: Commercial Managed Care - Pharmacy Benefit Manager

## 2023-08-15 ENCOUNTER — Ambulatory Visit: Payer: Commercial Managed Care - Pharmacy Benefit Manager

## 2023-08-17 ENCOUNTER — Inpatient Hospital Stay: Payer: Commercial Managed Care - Pharmacy Benefit Manager

## 2023-08-17 ENCOUNTER — Inpatient Hospital Stay: Payer: PRIVATE HEALTH INSURANCE

## 2023-08-17 ENCOUNTER — Ambulatory Visit: Payer: Commercial Managed Care - Pharmacy Benefit Manager

## 2023-08-17 DIAGNOSIS — S99911A Unspecified injury of right ankle, initial encounter: Secondary | ICD-10-CM

## 2023-08-17 DIAGNOSIS — R051 Acute cough: Secondary | ICD-10-CM

## 2023-08-17 MED ORDER — CELECOXIB 200 MG PO CAPS
200 mg | ORAL_CAPSULE | Freq: Two times a day (BID) | ORAL | 0 refills | 30.00000 days | Status: AC | PRN
Start: 2023-08-17 — End: ?

## 2023-08-17 MED ORDER — BENZONATATE 200 MG PO CAPS
200 mg | ORAL_CAPSULE | Freq: Three times a day (TID) | ORAL | 1 refills | 10.00000 days | Status: AC | PRN
Start: 2023-08-17 — End: ?

## 2023-08-17 NOTE — Progress Notes
 PATIENT: Andrew [name]  MRN: [id number]  DOB: 09-04-1955  DATE OF SERVICE: 08/17/2023  CHIEF COMPLAINT:   Chief Complaint   Patient presents with    Ankle Injury     Pt has mobility issues (CVA) but uses a walker to ambulate. States yesterday he was getting in the pool using his walker but mis-judged the depth and walker fell into the pool which caused him to slip and injured right ankle. States he heard a pop.         HISTORY OF PRESENT ILLNESS     Andrew [name] is a 68 y.o. 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)    Hypertension    Gout    Seborrheic dermatitis    Urinary frequency    H/O aortic valve replacement    Bicuspid aortic valve    Age related osteoporosis    Prediabetes    H/O bicuspid aortic valve    H/O: CVA (cerebrovascular accident)       PAST SURGICAL HISTORY     Past Surgical History:   Procedure Laterality Date    AORTIC VALVE REPLACEMENT         ALLERGIES     No Known Allergies    MEDICATIONS     Medications that the patient states to be currently taking   Medication Sig    ALLOPURINOL 100 mg tablet TAKE 2 TABLETS BY MOUTH EVERY DAY    AMLODIPINE 5 mg tablet TAKE 1 TABLET (5 MG TOTAL) BY MOUTH DAILY.    amoxicillin 500 mg capsule Prior to dentalappointment.    ascorbic acid 500 mg tablet Take 1 tablet (500 mg total) by mouth daily.    aspirin 81 mg EC tablet Take 1 tablet (81 mg total) by mouth daily.    ATORVASTATIN 20 mg tablet TAKE 1 TABLET BY MOUTH EVERYDAY AT BEDTIME CALCIUM PO Take 500 mg by mouth two (2) times daily.    carvedilol 6.25 mg tablet Take 1 tablet (6.25 mg total) by mouth two (2) times daily.    FOLIC ACID PO Take 1,000 mcg by mouth daily.    losartan 25 mg tablet Take 1 tablet (25 mg total) by mouth two (2) times daily.    Multiple Vitamins-Minerals (HCA SUPER THERAVITE-M PO) Take by mouth daily.    pantoprazole 20 mg DR tablet Take 1 tablet (20 mg total) by mouth daily.       SOCIAL HISTORY     Social History     Tobacco Use    Smoking status: Former     Current packs/day: 0.00     Average packs/day: 1 pack/day for 15.0 years (15.0 ttl pk-yrs)     Types: Cigarettes     Start date: 05/03/1975     Quit date: 05/02/1990     Years since quitting: 33.3    Smokeless tobacco: Never  Substance Use Topics    Alcohol use: Not Currently     Comment: occasionally has red wine       FAMILY HISTORY     Family History   Problem Relation Age of Onset    No Known Problems Mother     Other (gastric ulcer) Father     Uterine cancer Sister     Stroke Brother     Kidney failure Brother     Colon cancer Neg Hx     Lung cancer Neg Hx     Prostate cancer Neg Hx        Social History     Tobacco Use    Smoking status: Former     Current packs/day: 0.00     Average packs/day: 1 pack/day for 15.0 years (15.0 ttl pk-yrs)     Types: Cigarettes     Start date: 05/03/1975     Quit date: 05/02/1990     Years since quitting: 33.3    Smokeless tobacco: Never   Substance Use Topics    Alcohol use: Not Currently     Comment: occasionally has red wine     FAMILY HISTORY     Family History   Problem Relation Age of Onset    No Known Problems Mother     Other (gastric ulcer) Father     Uterine cancer Sister     Stroke Brother     Kidney failure Brother     Colon cancer Neg Hx     Lung cancer Neg Hx     Prostate cancer Neg Hx        ROS     All ROS are negative except those which have been described above in the HPI.    PHYSICAL EXAM     General appearance: Alert, appears stated age, cooperative and no acute distress  Head: Normocephalic, without obvious abnormality, atraumatic.  Eyes: Pupils equal, round, and reactive to light. Lids and conjunctivae grossly normal.  Ears: Normal TM's and external ear canals bilaterally.  Nose: Nasal mucosa - normal.  Mouth: Oropharynx clear without exudates.   Neck: Midline trachea, no rigidity, no thyroid enlargement. No cervical lymphadenopathy.  Chest wall: Symmetric chest wall, no masses appreciated, no chest wall tenderness.  Resp: Clear to auscultation bilaterally with normal respiratory effort.  No wheezes, rales, or rhonchi.  CV: Regular rate and rhythm, S1, S2 normal. No murmur,rub, or gallop. No edema. JVD - flat. 2+ pedal pulses equal bilaterally. No carotid bruits.   GI: Bowel sounds normal; soft, non-tender, non-distended;  no masses,  no organomegaly.  MSK:  Inspection grossly wnl, muscle tone grossly normal. Back - no kyphosis present, no scoliosis present. Extremities normal, atraumatic, full ROM.  Skin: Skin color grossly normal. No rashes or lesions.   Neurologic: CN 2-12 in tact. Motor and sensory normal. No tremor. Reflexes in tact and equal bilaterally.  Psychiatric: Mood and affect appear within normal limits, cognition appeared normal.    LABS/STUDIES     Lab Results   Component Value Date    WBC 5.44 07/30/2023    WBC 5.48 07/23/2020    HGB 14.7 07/30/2023    HGB 15.9 07/23/2020    HCT 44.6 07/30/2023    HCT 47.0 07/23/2020    MCV 86.4 07/30/2023    MCV 86.9 07/23/2020    PLT 192 07/30/2023    PLT 203 07/23/2020     Lab Results   Component Value Date    NA 143  07/30/2023    NA 137 05/25/2023    NA 142 07/15/2022    K 3.9 07/30/2023    K 3.8 05/25/2023    K 3.8 07/15/2022    CL 103 07/30/2023    CL 100 05/25/2023    CL 103 07/15/2022    MG 1.9 07/30/2023    CALCIUM 8.7 07/30/2023    CALCIUM 8.9 05/25/2023    CALCIUM 9.1 07/15/2022    CO2 23 07/30/2023    CO2 28 05/25/2023    CO2 26 07/15/2022    CREAT 0.72 07/30/2023    CREAT 0.69 05/25/2023    CREAT 0.73 07/15/2022     Lab Results   Component Value Date    ALT 31 07/30/2023    ALT 27 05/25/2023    ALT 24 07/15/2022    AST 25 07/30/2023    AST 24 05/25/2023    AST 23 07/15/2022    ALKPHOS 66 07/30/2023    ALKPHOS 67 05/25/2023    ALKPHOS 56 07/15/2022    BILITOT 0.5 07/30/2023    BILITOT 0.6 05/25/2023    BILITOT 0.5 07/15/2022    TOTPRO 7.8 07/30/2023    TOTPRO 7.5 05/25/2023    TOTPRO 7.5 07/15/2022    ALBUMIN 4.3 07/30/2023    ALBUMIN 4.3 05/25/2023    ALBUMIN 4.4 07/15/2022     No results found for: ''TSH''  Lab Results   Component Value Date    HGBA1C 6.4 (H) 05/25/2023    HGBA1C 6.1 (H) 07/15/2022    HGBA1C 6.3 (H) 12/23/2021     Lab Results   Component Value Date    CHOL 123 05/25/2023    CHOL 131 07/15/2022    CHOL 119 12/23/2021    CHOLHDL 38 (L) 05/25/2023    CHOLHDL 41 07/15/2022    CHOLHDL 37 (L) 12/23/2021    CHOLDLCAL 68 05/25/2023    CHOLDLCAL 73 07/15/2022    CHOLDLCAL 67 12/23/2021    CHOLDLQ 61 03/11/2012    TRIGLY 84 05/25/2023    TRIGLY 86 07/15/2022    TRIGLY 74 12/23/2021       All labs, imaging, and other available studies have been reviewed.    ASSESSMENT & PLAN     1. Injury of right ankle, initial encounter (Primary)  Had slip-and-fall incident couple days ago  Misjudged depth of pool and fell forward with his walker  Denies head trauma or LOC  Was able to get up right away and has continued walking w/ walker  Exam notable for mild TTP and edema of of medial malleolus  Etiology c/f sprain but will check xray to r/o fx  Advised prn nsaids, compression, elevation  Advised to f/u w/ PCP in 1-2 weeks  - celecoxib 200 mg capsule; Take 1 capsule (200 mg total) by mouth two (2) times daily as needed for Pain.  Dispense: 60 capsule; Refill: 0  - XR ankle ap+lat+mortise right (3 views); Future    2. Acute cough  Has couple days of dry intermittent cough  Has recent CXR w/ mild pleural effusion and pulmonary edema  Etiology c/f infection vs effusion iso valvular dz  Will check repeat xray to eval further  Will trial prn tessalon perles and other supportive measures  - benzonatate 200 mg capsule; Take 1 capsule (200 mg total) by mouth three (3) times daily as needed for Cough.  Dispense: 30 capsule; Refill: 1  - XR chest pa+lat (2 views); Future        The  above recommendation were discussed with the patient.  The patient has all questions answered satisfactorily and is in agreement with this recommended plan of care. Over 30 minutes were spent on evaluation and management of acute and chronic problems.      Author:  Quin Brush. Yardley Beltran 08/17/2023 4:11 PM

## 2023-08-18 ENCOUNTER — Ambulatory Visit: Payer: PRIVATE HEALTH INSURANCE

## 2023-08-18 ENCOUNTER — Other Ambulatory Visit: Payer: PRIVATE HEALTH INSURANCE

## 2023-08-18 DIAGNOSIS — S8251XD Displaced fracture of medial malleolus of right tibia, subsequent encounter for closed fracture with routine healing: Secondary | ICD-10-CM

## 2023-08-19 ENCOUNTER — Ambulatory Visit: Payer: Commercial Managed Care - HMO

## 2023-08-19 ENCOUNTER — Inpatient Hospital Stay: Payer: Commercial Managed Care - Pharmacy Benefit Manager

## 2023-08-19 ENCOUNTER — Inpatient Hospital Stay: Payer: PRIVATE HEALTH INSURANCE

## 2023-08-19 DIAGNOSIS — S99911A Unspecified injury of right ankle, initial encounter: Secondary | ICD-10-CM

## 2023-08-19 NOTE — Patient Instructions
 Thank you for entrusting your care to Dr. Jean Rosenthal and the [facility] Division of Acute Care Orthopaedics and Sports Medicine.  As we continue your medical care, we would like to share a few helpful resources with regard to your ongoing care coordination and follow-up appointments:     Follow-Up Visits: Please note that Dr. Jean Rosenthal currently does not see patients for follow-up visits in person. If follow-up is warranted, a referral will be placed to enable follow-up with another sports medicine provider or a surgical specialist as deemed appropriate by Dr. Jean Rosenthal.    We try to prioritize telemedicine virtual follow-up visits when possible to minimize your time away from home & work as well as the hassle of [place] traffic and parking on campus.  To schedule a follow-up visit, please call the clinic to schedule you for a video visit follow-up.  Note that some issues may specifically require an in-person follow-up visit (e.g. procedures, ultrasounds, etc.).     Imaging (MRI, CT, Ultrasound): You can schedule any needed imaging directly with the [facility] Radiology Department by calling [phone]. Once completed, the radiology report with results will post within 1-3 days. The results will be discussed in further detail at your follow-up visit. If urgent action is required based on results, Dr. Jean Rosenthal will reach out via MyChart with brief recommendations.    Nerve Conduction & EMG Studies: You can schedule any needed nerve testing directly with the [facility] Physical Medicine Division by calling [phone].    Physical Therapy: If you have questions regarding your physical therapy prescription or need your physical therapy extended, please send a message via the MyChart secure messaging system so that we can renew or adjust your prescription accordingly. Please include the name of your preferred physical therapy location.      Medication Refills: If you need a medication refill, please send a message via the MyChart secure messaging system so that we can refill your prescription appropriately.    Medication Authorization: If you have questions about insurance authorization for viscosupplementation (Monovisc, Durolane, SynviscOne, Orthovisc, Euflexxa, Supartz, etc.) or long-acting triamcinolone (Zilretta), please send a message via the MyChart secure messaging system and my assistant will send you an update as soon as possible.      Medical Questions: If you have additional questions about your injury, diagnosis, or treatment plan, you can either call to schedule a follow-up visit with another provider, or send a secure message via MyChart or schedule a video visit appointment to discuss your questions in detail.    New Injuries or Issues: New musculoskeletal injuries are unpredictable and will inevitable arise.  To discuss a new injury or issue, please schedule a new Walk-In Clinic appointment via the [facility] My Spot feature on our website below:     [email or web address]    Sincerely, the office of:    Lelon Huh, MD  Health Sciences Assistant Clinical Professor  Department of Orthopaedic Surgery  Division of Acute Care Orthopaedics and Sports Medicine  [employer] [facility] [place]    ------------------------------------------------------------------------------------

## 2023-08-19 NOTE — H&P
  Department of Orthopaedic Surgery  Division of Acute Care Orthopedics    IMMEDIATE CARE CLINIC ORTHO CONSULTATION        Date: 08/19/23    Referred by:  No referring provider defined for this encounter.  Primary Care Physician:  Maggie Schooner, MD    Chief Complaint: Right Ankle Injury    HISTORY:     Andrew [name] is a 68 y.o. male presenting with a right ankle injury. The patient sustained a fall on 5/15  when he was ambulating with his walker and misjudged the depth of the pool and fell forward with his walker. He was able to get up right away and continue walking with his walker. He was evaluated by urgent care and was given a CAM boot that he left at home. He currently can ambulate short distances but with significant pain. The patient was prescribed Celebrex and cannot take NSAIDs due to taking aspirin.     He denies a previous h/o of ankle injury.    The patient had a prior history of stroke.     Past Medical History: I have reviewed and confirmed the past medical history in the chart.  Past Medical History:   Diagnosis Date    CVA (cerebral vascular accident) (HCC/RAF)     H/O aortic valve replacement     Kidney stone     Urinary frequency        Past Surgical History: I have reviewed and confirmed the past surgical history in the chart.  Past Surgical History:   Procedure Laterality Date    AORTIC VALVE REPLACEMENT         Medications: Reviewed medication list in the chart  Outpatient Medications Prior to Visit   Medication Sig    ALLOPURINOL 100 mg tablet TAKE 2 TABLETS BY MOUTH EVERY DAY    AMLODIPINE 5 mg tablet TAKE 1 TABLET (5 MG TOTAL) BY MOUTH DAILY.    amoxicillin 500 mg capsule Prior to dentalappointment.    ascorbic acid 500 mg tablet Take 1 tablet (500 mg total) by mouth daily.    aspirin 81 mg EC tablet Take 1 tablet (81 mg total) by mouth daily.    ATORVASTATIN 20 mg tablet TAKE 1 TABLET BY MOUTH EVERYDAY AT BEDTIME    benzonatate 200 mg capsule Take 1 capsule (200 mg total) by mouth three (3) times daily as needed for Cough.    CALCIUM PO Take 500 mg by mouth two (2) times daily.    carvedilol 6.25 mg tablet Take 1 tablet (6.25 mg total) by mouth two (2) times daily.    celecoxib 200 mg capsule Take 1 capsule (200 mg total) by mouth two (2) times daily as needed for Pain.    FOLIC ACID PO Take 1,000 mcg by mouth daily.    losartan 25 mg tablet Take 1 tablet (25 mg total) by mouth two (2) times daily.    Multiple Vitamins-Minerals (HCA SUPER THERAVITE-M PO) Take by mouth daily.    pantoprazole 20 mg DR tablet Take 1 tablet (20 mg total) by mouth daily.     No facility-administered medications prior to visit.       Allergies: Reviewed allergy section in the chart  No Known Allergies    Family History:   Family History   Problem Relation Age of Onset    No Known Problems Mother     Other (gastric ulcer) Father     Uterine cancer Sister     Stroke Brother  Kidney failure Brother     Colon cancer Neg Hx     Lung cancer Neg Hx     Prostate cancer Neg Hx        Social History:   Social History     Socioeconomic History    Marital status: Married   Tobacco Use    Smoking status: Former     Current packs/day: 0.00     Average packs/day: 1 pack/day for 15.0 years (15.0 ttl pk-yrs)     Types: Cigarettes     Start date: 05/03/1975     Quit date: 05/02/1990     Years since quitting: 33.3    Smokeless tobacco: Never   Substance and Sexual Activity    Alcohol use: Not Currently     Comment: occasionally has red wine    Drug use: Not Currently       Review of Systems: A 14-point review of systems was performed.  With the exception of the HPI, all other review of systems was negative.     PHYSICAL EXAM:     Vitals: There were no vitals taken for this visit.  General: NAD, pleasant & cooperative  Head: EOMI, no facial lesions  Neuro: Normal sensation, no motor deficits  Skin: No ulcers, erythema, or skin breakdown  Psych: Alert, appropriate affect  MSK:  Right Ankle/Foot:  Inspection: Ecchymosis and mild effusion over medial aspect of the ankle.   ROM: Active and passive ROM normal  Palpation: TTP over medial malleolus  Syndesmosis: Squeeze test negative.  Special Tests: Talar tilt negative, Anterior Drawer negative, Thompson test neg  Functional Testing: Single leg toe raise: normal, Single leg hop test: normal     IMAGING:     I personally reviewed the imaging and my interpretation is as follows:    XR Right Ankle (5/17): FINDINGS:     There is a mildly displaced medial malleolus fracture, with involvement of the superomedial ankle clear space. There is associated generalized soft tissue swelling about the ankle.     Tibiotalar alignment is grossly normal, although evaluation is somewhat limited on nonstress/nonweightbearing examination. The talar dome is smooth.     There are small plantar and posterior calcaneal enthesophytes.     There is no tibiotalar effusion.           IMPRESSION: Mildly displaced medial malleolus fracture, with associated soft tissue swelling about the ankle.    Outside imaging reviewed:None    XR Right Ankle (5/19): Re demonstrates mildly displaced medial malleolus fracture, with no evidence of widening of ankle mortis.     ASSESSMENT:     Andrew [name] is a 68 y.o. male who presents with history and examination consistent with a mildly displaced medial malleolus fracture. No mortisse widening on stress view.     RECOMMENDATIONS & PLAN:     The diagnosis and treatment options were discussed and reviewed in detail with the patient today who communicates understanding with the plan.     Given our clinical suspicion of the above diagnosis, our initial plan is as follows:    Plan:  I discussed the etiology and treatment plan with the patient, who communicates understanding with the plan. I explained that his radiographs are consistent with a mildly displaced medial malleolus fracture of the right ankle. Given that there is no widening of the ankle mortise and that the fracture is not significantly displaced, I recommend conservative management consisting of immobilization in a short CAM boot for the next 3-4  weeks (to be obtained by patient), time, rest and activity modification. The patient may continue to take Celebrex and Tylenol for management.     Follow-Up: As above    Medical Decision Making:   Complexity of problems addressed: Moderate  Complexity of data that was personally reviewed and analyzed: Moderate including the independent interpretation of musculoskeletal imaging  Risk of complications & morbidity: Moderate which included a discussion of prescription medications, joint and tissue injections, and surgical interventions    Dennie Fitch, MD  Health Sciences Assistant Clinical Professor   Department of Orthopaedic Surgery  [facility]

## 2023-08-20 ENCOUNTER — Inpatient Hospital Stay: Payer: Commercial Managed Care - Pharmacy Benefit Manager

## 2023-08-21 ENCOUNTER — Other Ambulatory Visit: Payer: Commercial Managed Care - Pharmacy Benefit Manager

## 2023-08-23 ENCOUNTER — Other Ambulatory Visit: Payer: Commercial Managed Care - HMO

## 2023-08-25 MED ORDER — ALLOPURINOL 100 MG PO TABS
200 mg | ORAL_TABLET | Freq: Every day | ORAL | 1 refills
Start: 2023-08-25 — End: ?

## 2023-08-26 DIAGNOSIS — S8254XA Nondisplaced fracture of medial malleolus of right tibia, initial encounter for closed fracture: Secondary | ICD-10-CM

## 2023-08-26 DIAGNOSIS — S82391A Other fracture of lower end of right tibia, initial encounter for closed fracture: Secondary | ICD-10-CM

## 2023-08-26 NOTE — Consults
 [facility]  Division of Sports Medicine    SPORTS MEDICINE CONSULTATION        Date: 09/17/2023    Referred by: Dario Edison., Md  [address]  [address]  [facility]  [place],  [place] [address]    Primary Care Physician:  Maggie Schooner, MD    Chief Complaint: right ankle pain (DOI 08/15/23)    HISTORY:     Dear Dr. Cleora Daft,    I had the pleasure of seeing Andrew [name] today in consultation in sports medicine. Jabbar Palmero is a 68 y.o. male presenting with right ankle pain. One month ago, patient was ambulating with his walker and fell forward after misjudging a step. He was able to get up and continue walking but was then seen in urgent care. He was given a walking boot at the time and started on NSAIDs. He was then seen in orthopedics walk in clinic and imaging showed a mildly displaced medial malleolus fracture with stable mortise. It was recommended that he continue weight-bearing as tolerated in his boot. ***    History of prior right ankle injury, trauma or surgery: none prior to above    Previous treatments include: NSAIDs, walking boot, ***    Mechanism of injury:  fall while walking  Sport/Activity: ***  Work: ***    Past Medical History: I have reviewed and confirmed the past medical history in the chart.  Past Medical History:   Diagnosis Date    CVA (cerebral vascular accident) (HCC/RAF)     H/O aortic valve replacement     Kidney stone     Urinary frequency        Past Surgical History: I have reviewed and confirmed the past surgical history in the chart.  Past Surgical History:   Procedure Laterality Date    AORTIC VALVE REPLACEMENT         Medications: Reviewed medication list in the chart  Outpatient Medications Prior to Visit   Medication Sig    ALLOPURINOL 100 mg tablet TAKE 2 TABLETS BY MOUTH EVERY DAY    AMLODIPINE 5 mg tablet TAKE 1 TABLET (5 MG TOTAL) BY MOUTH DAILY.    amoxicillin 500 mg capsule Prior to dentalappointment. ascorbic acid 500 mg tablet Take 1 tablet (500 mg total) by mouth daily.    aspirin 81 mg EC tablet Take 1 tablet (81 mg total) by mouth daily.    ATORVASTATIN 20 mg tablet TAKE 1 TABLET BY MOUTH EVERYDAY AT BEDTIME    benzonatate 200 mg capsule Take 1 capsule (200 mg total) by mouth three (3) times daily as needed for Cough.    CALCIUM PO Take 500 mg by mouth two (2) times daily.    carvedilol 6.25 mg tablet Take 1 tablet (6.25 mg total) by mouth two (2) times daily.    celecoxib 200 mg capsule Take 1 capsule (200 mg total) by mouth two (2) times daily as needed for Pain.    FOLIC ACID PO Take 1,000 mcg by mouth daily.    losartan 25 mg tablet Take 1 tablet (25 mg total) by mouth two (2) times daily.    Multiple Vitamins-Minerals (HCA SUPER THERAVITE-M PO) Take by mouth daily.    pantoprazole 20 mg DR tablet Take 1 tablet (20 mg total) by mouth daily.     No facility-administered medications prior to visit.       Allergies: Reviewed allergy section in the chart  No Known Allergies    Family History:   Family History   Problem Relation Age of Onset    No Known Problems Mother     Other (gastric ulcer) Father     Uterine cancer Sister     Stroke Brother     Kidney failure Brother     Colon cancer Neg Hx     Lung cancer Neg Hx     Prostate cancer Neg Hx        Social History:   Social History     Socioeconomic History    Marital status: Married   Tobacco Use    Smoking status: Former     Current packs/day: 0.00     Average packs/day: 1 pack/day for 15.0 years (15.0 ttl pk-yrs)     Types: Cigarettes     Start date: 05/03/1975     Quit date: 05/02/1990     Years since quitting: 33.3    Smokeless tobacco: Never   Substance and Sexual Activity    Alcohol use: Not Currently     Comment: occasionally has red wine    Drug use: Not Currently       Review of Systems: A 14-point review of systems was performed.  With the exception of the HPI, all other review of systems was negative.     PHYSICAL EXAM:     Vitals: There were no vitals taken for this visit.  General: NAD, pleasant & cooperative  Head: EOMI, no facial lesions    Right Ankle:  Inspection: Edema: {:315643}, Ecchymoses: {:315643}, Deformity: {:315643}, Warmth: {:315643}  ROM: Active and passive ROM ***  Palpation: TTP over ***.  No TTP over 5th metatarsal, medial and lateral malleolus, navicular, or talus.  No TTP over the peroneal, posterior tibial, anterior tibial, and Achilles' tendons.  Foot Arch:  {Blank single:19197::''pes planus (flexible)'',''pes planus (rigid)'',''pes cavus'',''normal arch''}  Strength: {Blank single:19197::''***'',''unable to plantarflex ankle'',''5/5 in dorsiflexion, plantarflexion, eversion, and inversion''}  Syndesmosis: Squeeze test {:315643}, Cotton test {:315643}  Special Tests: Talar tilt {:315643}, Anterior Drawer {:315643}, Thompson test normal  Functional Testing: Single leg toe raise: ***, Single leg hop test: ***, Lateral hop test: ***  Neurovascular: {Blank single:19197::''***'',''normal''} distal pulses and sensation.    Skin: No ulcers, erythema, or skin breakdown  Psych: Alert, appropriate affect    IMAGING:     I have personally reviewed and interpreted the following results:     XR ankle AP + lat + obl standing right 3V (***):  Reason for exam: right ankle pain    Radiographs taken in our office today and reviewed with patient in detail reveal:    Stable alignment of mildly displaced medial malleolus fracture and lateral aspect of distal tibia with evidence of early interval healing. Ankle mortise is intact. No erosion or calcification. No significant degenerative changes.    XR right ankle (08/19/23):  My read: Minimally displaced fractures of the medial malleolus and lateral aspect of distal tibia with intact mortise.    Formal read:  ''Minimally displaced intra-articular fracture of the medial malleolus shows unchanged alignment.   Mildly displaced intra-articular fracture of the lateral aspect of the distal tibia (Tillaux-Chaput tubercle fracture) also shows unchanged alignment.  There is no change in alignment on the gravity stress view.  Persistent diffuse soft tissue swelling, which is most pronounced at the medial aspect of the ankle and distal lower leg.  Joint spaces and ankle mortise are maintained.  Plantar retrocalcaneal enthesophytes.''    ASSESSMENT:  Andrew [name] is a 68 y.o. male who presents with right ankle pain. History and examination are concerning for minimally displaced fractures of the medial malleolus and lateral aspect of distal tibia with intact mortise. We extensively reviewed management options including rest, physical therapy, bracing, anti-inflammatories, analgesics, and indications for surgery. After a discussion regarding these treatment options, the patient has elected to proceed with ***.       RECOMMENDATIONS & PLAN:     Given our clinical suspicion of the above diagnosis, our initial plan is as follows:    There are no diagnoses linked to this encounter.    Follow-Up: No follow-ups on file.    Medical Decision Making:***  Complexity of problems addressed: Moderate  Complexity of data that was personally reviewed and analyzed: Moderate including the independent interpretation of musculoskeletal imaging***  Risk of complications & morbidity: Moderate which included a discussion of prescription medications, joint and tissue injections, and surgical interventions    The above plan of care, diagnosis, orders, and follow-up were discussed with the patient.  Questions related to this recommended plan of care were answered.    Once again it was a pleasure to see Andrew [name] today in consultation. Thank you for allowing me to participate in their care.  Please feel free to contact me if you have additional questions or if I can be of any further assistance.    Otis Blocker, MD  Primary Care Sports Medicine Physician  Division of Sports Medicine ~ Department of Orthopaedic Surgery  [facility]

## 2023-08-27 MED ORDER — ALLOPURINOL 100 MG PO TABS
200 mg | ORAL_TABLET | Freq: Every day | ORAL | 1 refills
Start: 2023-08-27 — End: ?

## 2023-08-28 MED ORDER — ALLOPURINOL 100 MG PO TABS
200 mg | ORAL_TABLET | Freq: Every day | ORAL | 1 refills | 30.00000 days | Status: SS
Start: 2023-08-28 — End: ?

## 2023-09-02 ENCOUNTER — Other Ambulatory Visit: Payer: Commercial Managed Care - HMO

## 2023-09-02 ENCOUNTER — Ambulatory Visit: Payer: Commercial Managed Care - Pharmacy Benefit Manager | Attending: Cardiovascular Disease

## 2023-09-07 ENCOUNTER — Other Ambulatory Visit: Payer: Commercial Managed Care - HMO

## 2023-09-09 ENCOUNTER — Telehealth: Payer: Commercial Managed Care - HMO

## 2023-09-09 NOTE — Telephone Encounter
 Call Back Request      Reason for call back: Per pt's wife, they called 911 and pt was taken by ambulance to [facility] in [facility] due to irregular heart beats. MD at facility recommended a pacemaker for pt and to be transferred to [facility], however [facility] did not have room. Since then he has another episode of rapid heart beat. She wants to know if Dr Isaac Manus would clear him for pt to have the pacemaker procedure at [facility] center. Please assist     Any Symptoms:  []  Yes  [x]  No      If yes, what symptoms are you experiencing:    Duration of symptoms (how long):    Have you taken medication for symptoms (OTC or Rx):      If call was taken outside of clinic hours:    [] Patient or caller has been notified that this message was sent outside of normal clinic hours.     [] Patient or caller has been warm transferred to the physician's answering service. If applicable, patient or caller informed to please call us  back if symptoms progress.  Patient or caller has been notified of the turnaround time of 1-2 business day(s).

## 2023-09-09 NOTE — Telephone Encounter
 Call Back Request      Reason for call back: [name] spouse asking for the previous message to be marked high priority.   ''Per pt's wife, they called 911 and pt was taken by ambulance to [facility] in [facility] due to irregular heart beats. MD at facility recommended a pacemaker for pt and to be transferred to [facility], however [facility] did not have room. Since then he has another episode of rapid heart beat. She wants to know if Dr Isaac Manus would clear him for pt to have the pacemaker procedure at [facility] center. Please assist ''    Any Symptoms:  []  Yes  [x]  No      If yes, what symptoms are you experiencing:    Duration of symptoms (how long):    Have you taken medication for symptoms (OTC or Rx):      If call was taken outside of clinic hours:    [] Patient or caller has been notified that this message was sent outside of normal clinic hours.     [] Patient or caller has been warm transferred to the physician's answering service. If applicable, patient or caller informed to please call us  back if symptoms progress.  Patient or caller has been notified of the turnaround time of 1-2 business day(s).

## 2023-09-10 ENCOUNTER — Inpatient Hospital Stay: Admit: 2023-09-10 | Payer: PRIVATE HEALTH INSURANCE

## 2023-09-10 DIAGNOSIS — Z953 Presence of xenogenic heart valve: Secondary | ICD-10-CM

## 2023-09-10 DIAGNOSIS — I480 Paroxysmal atrial fibrillation: Secondary | ICD-10-CM

## 2023-09-10 DIAGNOSIS — R55 Syncope and collapse: Secondary | ICD-10-CM

## 2023-09-10 DIAGNOSIS — R2689 Other abnormalities of gait and mobility: Secondary | ICD-10-CM

## 2023-09-10 DIAGNOSIS — I495 Sick sinus syndrome: Secondary | ICD-10-CM

## 2023-09-10 DIAGNOSIS — I69398 Other sequelae of cerebral infarction: Secondary | ICD-10-CM

## 2023-09-10 LAB — CBC: MEAN CORPUSCULAR HEMOGLOBIN: 28.3 pg (ref 26.4–33.4)

## 2023-09-10 LAB — Magnesium: MAGNESIUM: 1.7 meq/L (ref 1.4–1.9)

## 2023-09-10 LAB — TSH with reflex FT4, FT3: TSH: 4.1 u[IU]/mL (ref 0.3–4.7)

## 2023-09-10 LAB — Hepatic Funct Panel: TOTAL PROTEIN: 7.1 g/dL (ref 6.1–8.2)

## 2023-09-10 LAB — Differential Automated: ABSOLUTE EOS COUNT: 0.2 10*3/uL (ref 0.00–0.50)

## 2023-09-10 LAB — APTT: APTT: 34.6 s (ref 24.4–36.2)

## 2023-09-10 LAB — Unfractionated Heparin, by anti-Xa: HEPARIN UNFRACTIONATED: 1.04 [IU]/mL — CR

## 2023-09-10 LAB — Prothrombin Time Panel: INR: 1 s (ref 11.5–5.0)

## 2023-09-10 LAB — Basic Metabolic Panel: ANION GAP: 10 mmol/L (ref 8–19)

## 2023-09-10 MED ADMIN — CHOLECALCIFEROL 25 MCG (1000 UT) PO TABS: 25 ug | ORAL | @ 15:00:00 | Stop: 2023-09-22

## 2023-09-10 MED ADMIN — APIXABAN 5 MG PO TABS: 5 mg | ORAL | @ 15:00:00 | Stop: 2023-09-10 | NDC 00003089431

## 2023-09-10 MED ADMIN — APIXABAN 5 MG PO TABS: 5 mg | ORAL | @ 15:00:00 | Stop: 2023-09-10

## 2023-09-10 MED ADMIN — HEPARIN (PORCINE) IN NACL 25000-0.45 UT/250ML-% IV SOLN: 910 [IU]/h | INTRAVENOUS | @ 18:00:00 | Stop: 2023-09-13 | NDC 00409765005

## 2023-09-10 MED ADMIN — ALLOPURINOL 100 MG PO TABS: 200 mg | ORAL | @ 15:00:00 | Stop: 2023-09-22 | NDC 60687067711

## 2023-09-10 MED ADMIN — ASCORBIC ACID 500 MG PO TABS: 500 mg | ORAL | @ 15:00:00 | Stop: 2023-09-22 | NDC 00904052361

## 2023-09-10 MED ADMIN — ASPIRIN 81 MG PO CHEW: 81 mg | ORAL | @ 15:00:00 | Stop: 2023-09-17 | NDC 66553000201

## 2023-09-10 MED ADMIN — POTASSIUM CHLORIDE CRYS ER 20 MEQ PO TBCR: 20 meq | ORAL | @ 15:00:00 | Stop: 2023-09-10 | NDC 00245531989

## 2023-09-10 MED ADMIN — SODIUM CHLORIDE 0.9 % IV SOLN: @ 15:00:00 | Stop: 2023-09-10 | NDC 00338004902

## 2023-09-10 MED ADMIN — MAGNESIUM SULFATE 2 GM/50ML IV SOLN: 2 g | INTRAVENOUS | @ 15:00:00 | Stop: 2023-09-10 | NDC 70121171901

## 2023-09-10 MED ADMIN — ASPIRIN 81 MG PO CHEW: 81 mg | ORAL | @ 15:00:00 | Stop: 2023-09-17

## 2023-09-10 MED ADMIN — ASPIRIN 81 MG PO CHEW: 81 mg | ORAL | @ 18:00:00 | Stop: 2023-09-17 | NDC 66553000201

## 2023-09-10 NOTE — Progress Notes
 [facility] DEPARTMENT OF MEDICINE   INPATIENT NOTE TEMPLATE   RESIDENT PROGRESS NOTE      CCU/COU PROGRESS NOTE - TRANSFER SUMMARY NOTE   PATIENT: Andrew [name]               MRN: [id number]   PCP: Maggie Schooner, MD  ADMISSION DATE: 09/09/2023  HOSPITAL DAY: 1  DATE OF SERVICE: 09/10/2023   CC: No chief complaint on file.     Course:   6/10: admitted overnight (see initial H&P for details).     Subjective & Significant Overnight Events      This morning, no endorsed concerns, family with questions. Denies fever, chest pain, palpitations, dyspnea, abdominal pain, N/V/D, weakness, and worsening dizziness/lightheadedness.    Medications   Scheduled:  allopurinol, 200 mg, Oral, Daily  ascorbic acid, 500 mg, Oral, Daily with breakfast  aspirin, 81 mg, Oral, Daily  atorvastatin, 20 mg, Oral, Nightly  cholecalciferol, 25 mcg, Oral, Daily    Infusions:   heparin 25,000 units/250 mL drip 1,000 Units/hr (09/10/23 1202)       PRN:  polyethylene glycol, senna   Physical Exam     Vital Signs:   Temp:  [36.3 ?C (97.3 ?F)-36.4 ?C (97.5 ?F)] 36.3 ?C (97.3 ?F)  Heart Rate:  [46-70] 64  Resp:  [12-18] 13  BP: (123-156)/(90-104) 142/99  NBP Mean:  [102-117] 111  SpO2:  [88 %-97 %] 97 %       I&O's:   I/O last 2 completed shifts:  In: 240 [P.O.:240]  Out: 575 [Urine:575] Oxygen Support:  Oxygen Therapy  SpO2: 97 %  O2 Device: None (Room air)           Weight for the past 720 hrs (Last 4 readings):   Weight   09/09/23 2312 87.8 kg (193 lb 9 oz)       System Normal Findings Abnormal Findings   General [x]  Normal general appearance   []  In acute distress  []  Chronically ill appearing  []  Sedated           []  Non Verbal  []  Other:   HENMT/Neck [x]  Oropharynx clear and moist  [x]  Normal JVP []  Dry mucous membranes  []  JVP ( cm)  []  Other:   Resp [x]  CTA B/L anteriorly, no cough/WOB.  []  Intubated                 []  On Supp O2 ()  []  Wheezing ()  []  Rales ()   []  Crackles ()  []  Other:   CV [x]  Normal rhythm/rate  [x]  No murmurs  [x]  No lower extremity edema  []  Normal PMI   Normal pulses:   []  Radial []  Femoral  []  Pedal []   Abnormal rhythm ()  []   Murmur ()  []  S3    []  S4  Abnormal Pulses:  []  Radial ()  []  Femoral ()   []  DP ()  []  PT ()  []  Lower extremity edema       []  R ()   []  L ()  []  Bilateral ()  []  Other:   GI [x]  Soft  [x]  No tenderness to palpation  [x]  No rebound/guarding []  Tenderness ()  []  Fluid Wave  []  Hepatomegaly  Other:   MSK [x]  Normal strength/tone []  Other:    Skin [x]  No rash []  Other:   Neuro [x]  AO x 3  []  Normal strength  []  Normal sensation  []  Normal reflexes  [x]  Moves extremities spontaneously []   AO x 0       []  AO x 1     []  AO x 2  Abnormal neuro exam:  []  Abnormal strength  []  Abnormal sensation  []  Abnormal reflexes  []  Abnormal cerebellar exam       Labs/Studies     Labs  (click to expand/collapse)    CBC  Recent Labs     09/10/23  0430   WBC 4.68   HGB 13.8   HCT 41.3   MCV 84.6   PLT 164     BMP  Recent Labs     09/10/23  0430   NA 141   K 3.8   CL 105   CO2 26   BUN 14   CREAT 0.73   CALCIUM 9.1   MG 1.7     LFT  Recent Labs     09/10/23  0430   TOTPRO 7.1   ALBUMIN 3.9   BILITOT 0.5   BILICON 0.2   ALT 109*   AST 35   ALKPHOS 75       Coags  Recent Labs     09/10/23  1006   INR 1.0   PT 13.3   APTT 34.6       Glucose  Recent Labs     09/10/23  0430   GLUCOSE 90     Additional Labs:  A1C = No results for input(s): ''HGBA1C'' in the last 72 hours., B12/Folate = No results for input(s): ''VITAMINB12'', ''FOLATE'' in the last 72 hours., BNP = No results for input(s): ''BNP'' in the last 72 hours., Iron Panel = No results for input(s): ''FERRITIN'', ''FE'', ''TIBC'', ''FEBINDSAT'' in the last 72 hours., Lipid Panel = No results for input(s): ''CHOL'', ''CHOLDLCAL'', ''TRIGLY'' in the last 72 hours., TSH =   Recent Labs     09/10/23  0430   TSH 4.1   , Trop = No results for input(s): ''TROPONIN'' in the last 72 hours., and Lactate =       Micro    No results found for this or any previous visit (from the past week).    Radiology    No imaging has been resulted in the last 24 hours    Cardio-diagnostics  ECHO 04/20/2019:  1. Left ventricle is normal in size with mild hypertrophy. Left ventricular   function is preserved and visually estimated to be 60-65%. There are no wall   motion abnormalities.   2. Normal biatrial size.   3. Normal right ventricular size and systolic function.   4. Bioprosthetic valve (25-mm Carpentier-Edwards Magna Ease ; 1/13; Dr. Gorge Laud)   is well seated in the aortic position. Peak velocity of 2.26 m/sec, mean   gradient of 11 mmHg, DI of 0.46, effective orifice area of 1.44 cm^2 (based   on LVOT diameter of 2cm)--consistent with normal prosthetic function.   5. Lack of an adequate TR signal precludes accurate estimation of right   ventricular systolic pressure. IVC diameter is = 2.1 cm with a > 50%   inspiratory collapse, suggestive of a right atrial pressure of 0-5 mmHg     ECHO 09/21/2020:   1. Normal LV size, mild septal hypertrophy, normal systolic function, normal LV diastolic function.   2. Left ventricular ejection fraction is approximately 60 to 65%.   3. Normal right ventricular size and normal systolic function.   4. A 25 mm Carpentier bioprosthetic valve is present in the aortic valve position. Echo findings are consistent with  normal structure and function of the aortic prosthesis.   5. Normal pulmonary artery systolic pressure.   6. There are no prior echocardiograms available for comparison.     ECHO 09/06/2021:   1. Normal LV size, mild septal hypertrophy, normal wall motion and systolic function.   2. Left ventricular ejection fraction is approximately 60 to 65%.   3. Mild LV diastolic dysfunction (grade I, impaired relaxation).   4. Normal right ventricular size and grossly normal systolic function.   5. A 25 mm Carpentier bioprosthetic valve is present in the aortic valve position. It appears well seated. No paravalvular regurgitation. Peak aortic velocity is 1.9 m/sec and mean gradient is 8.0 mm Hg. By the continuity equation, the aortic valve area  is 1.74 cm? (Indexed AVA is 0.93 cm?/m?).   6. Upper normal aortic root (3.8 cm). Mildly enlarged mid ascending aorta (4.2 cm).   7. No significant valvular regurgitation.   8. Tricuspid regurgitant jet inadequate to assess pulmonary artery systolic pressure.   9. A prior echo performed on 09/21/2020 was reviewed for comparison. No significant changes noted since the previous study.    ECHO 12/29/2021:   1. Normal LV size, mild concentric left ventricular hypertrophy, normal systolic function, indeterminate diastolic function.   2. Left ventricular ejection fraction is approximately 60 to 65%.   3. Normal right ventricular size and normal systolic function.   4. Status post Carpentier bioprosthetic placement in the aortic valve position without regurgitation. Echo findings are consistent with normal structure and function of the aortic prosthesis. The aortic valve area is 1.39 cm? (Indexed AVA is 0.74 cm?/m?).   5. Trace tricuspid regurgitation and normal pulmonary artery systolic pressure.   6. A prior echo performed on 09/06/2021 was reviewed for comparison. The mean AV gradient has increased from 8 mmHg to 12 mmHg. Aortic valve was 1.74 cm2 in the previous study.     ECHO 09/07/2022  1. Normal LV systolic function. Normal left ventricular regional wall motion.  Left ventricular ejection fraction is approximately 60-65%.  2. Small LV size and moderate concentric left ventricular hypertrophy.  3. Mild LV diastolic dysfunction (grade I, impaired relaxation).  4. Not well seen right ventricular size and normal systolic function.  5. Normal atrial sizes.  6. A 25 mm Carpentier bioprosthetic valve is present in the aortic valve  position. Echo findings are consistent with normal structure and function of  the aortic prosthesis with mean gradient of 14 mmHg and AVA 1.26cm2.  7. Trace mitral valve regurgitation.  8. Trace to mild tricuspid valve regurgitation.  9. Tricuspid regurgitant jet inadequate to assess pulmonary artery systolic  pressure.  10. Normal mean pulmonary artery pressure by PVAT.  11. Mildly enlarged mid ascending aorta (4.1 cm).  12. A prior echo performed on 12/29/2021 was reviewed for comparison.  Carpentier valve measurements and findings as above similar to prior study.     ECHO 08/2023  CONCLUSIONS   1. Normal left ventricular size, moderate concentric left ventricular hypertrophy and normal LV systolic function.   2. LV ejection fraction is approximately 60 to 65%.   3. Abnormal LV diastolic function (Grade I).   4. A 25 mm Carpentier bioprosthetic valve is present in the aortic position. Echo findings are consistent with normal structure and function of the aortic prosthesis with mean gradient of 11 mmHg.   5. The peak TR velocity is not well defined, and therefore, pulmonary artery systolic pressure cannot be calculated.   6. Mildly dilated  proximal ascending aorta (41 mm).   7. Prior examinations are available and were reviewed for comparison purposes. Compared to prior study on 09/07/22, there are no significant changes.     CT chest 09/21/2020:  Postoperative changes of aortic valve replacement. Ectasia of the ascending aorta measures up to 3.9 cm in the proximal arch grossly unchanged since 2013     ECG 06/07/2020:  Normal sinus rhythm at ventricular rate of 74 beats per minute.  Mild ST elevation in inferior leads.  Described before.      A&P   Nayib Remer is a 68 y.o. 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 HDS.     #Presyncope  #Tachy-brady SSS w multiple sinus pauses (longest 5 sec per OSH)  #Bifascicular block   #HTN  POA  Per report by family, patient has had episodes of dizziness that correspond to tachycardia/bradycardia in OSH (5/19, [place] [facility] and just recently at [facility]). Blood pressure has also been labile in these episodes. OSH records not accessible at this time. Previously noted in [facility] outpt cards note 08/09/23 but [facility] clinic visits with SBP 100-110. Previously on apix/amio trial in the past, on carvedilol for HTN. Followed by Dr. Anniece Base. EP note from [facility] noted with tachy-brady SSS and bifascicular block with multiple sinus pauses (longest 5 seconds). Some Afib noted in their note.   - EP consulted, appreciate their recs for inpt pacemaker eval:   - will see patient   - request for records of actual tracings of 5 second pause (REQUESTED)  Diagnostics:  - Telemetry - HR ranges brady at low 40s here  - TSH, CMP to r/o reversible causes of sinus node dysfunction              - home meds include carvedilol and dilt without recent med changes  - Consider exercise treadmill  test to evaluate for chronotropic incompetence  - Review outside records from [facility] (media tab)  Therapeutics:              - Continue metoprolol 12.5mg  BID              - HOLD apixaban 5mg  BID for 48 hour washout in anticipation for IP              - Start heparin gtt wo bolus interim               - IP as above     #Bicuspid Aortic valve s/p aortic valve replacement in 2013  POA  Normal valve function in most recent echo 08/2023. Gets serial imaging yearly.  - Continue aspirin 81mg  qdaily     #History of CVA   #Left Lower Extremity Weakness  POA  Presumed post-surgical. Patient with stable lower extremity weakness. Walks with walker.     #HLD  POA  - Cont atorvastatin 20mg  qdaily    The patient was not consulted or assessed by a Registered Dietitian (RD) during this admission.     # IP Checklist  - Diet: Diet regular  - VTE PPX: heparin gtt  - LDA:   Peripheral IV Posterior;Right Hand (1)    Advance Care Planning:   Full Code,  Primary Emergency Contact: Taft Fabry, Home Phone: [phone], Mobile Phone: [phone]    Disposition:  Expected post-hospitalization disposition will be to Home.  Discussed with attending, Dr. Drexel Gentles.     Author:    Rodman Clam  09/10/2023 2:50 PM  PGY1 IM

## 2023-09-10 NOTE — Other
 [name] Clinical Goal:   Clinical Goal(s) for the Shift: VSS, stable cardiac rhythm, no c/p of cp/sob/n/v/pain, replace lytes prn, maintain comfort and safety  Identify possible barriers to advancing the care plan: none  Stability of the patient: Moderately Unstable - medium risk of patient condition declining or worsening    Progression of [name] Clinical Goal: Pt is A&Ox4, FC, MAE, afebrile, VSS. Pt is NSR on monitor with HR 60s-70s. Pt is on RA satting >92%. No c/o of CP, SOB, N/V or dizziness during the shift. Pt denies pain. Strict I&O and daily weights maintained. Maintained reg diet. K and mag repleted. Safety precautions maintained.  Plan  PM placement eval

## 2023-09-10 NOTE — H&P
 CCU/COU HISTORY & PHYSICAL     PATIENT: Andrew [name]  MRN: [id number]  DOB: Dec 10, 1955  DATE OF SERVICE: 09/10/2023     REFERRING PRACTITIONER: Zhao, Evan, DO  PRIMARY CARE PROVIDER: Maggie Schooner, MD    CHIEF COMPLAINT: No chief complaint on file.       HPI:     Andrew [name] is a 68 y.o. 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    On 07/30/2023 presented to ED with chest pain x 2 weeks, worsens at night, SOB, weakness, elevated BP at home. Patient also endorsed an episode of dizziness, palpitations, and pre-syncope.  ED workup was unrevealing with troponin flat at 15 x2, BNP normal, and EKG stable. Was discharged with cardiology follow-up.    On 5/19 patient began to experience spontaneous light-headedness/dizziness, sweating, shortness of breath. He also had low blood pressure ( 95/72). He was brought to [facility] where they did chest x-ray and saw fluid build-up in lungs. During this hospitalization, he was kept on monitor and was found to have episodes of tachycardia and bradycardia that corresponded with his dizziness symptoms. Blood pressures were labile within these episodes. Patient was started on apixaban and amiodarone. Since then, he has since stopped amiodarone and been trialed on diltiazem which was also dc'd. Patient currently on metoprolol succ 12.5mg  BID.    On 6/1, patient had another similar episode of dizziness and palpitations. Pts wife called 911 and he was taken to [facility] due irregular heart beats. During this hospitalization, pt was diagnosed with tachy-brady SSS with multiple sinus pauses, longest 5 seconds and bifascicular block. MD at [facility] recommended pacemaker placement but lack capabilities, recommended transfer to [facility].    On interview today, patient denies CP, SOB, dizziness, orthopnea, PND, lower extremity edema.    Social History:  Alcohol: 1-2 drinks a month  Smoking: 30 py history, quit 25 years ago  Drugs: None    Review of Systems:   A 14-system review of systems was performed and is negative except as stated in the history of present illness.    PMHx:      Past Medical History:   Diagnosis Date    CVA (cerebral vascular accident) (HCC/RAF)     H/O aortic valve replacement     Kidney stone     Urinary frequency        PSHx:     Past Surgical History:   Procedure Laterality Date    AORTIC VALVE REPLACEMENT         FamHx:      Family History   Problem Relation Age of Onset    No Known Problems Mother     Other (gastric ulcer) Father     Uterine cancer Sister     Stroke Brother     Kidney failure Brother     Colon cancer Neg Hx     Lung cancer Neg Hx     Prostate cancer Neg Hx        SocHx:     Social History     Socioeconomic History    Marital status: Married   Tobacco Use    Smoking status: Former     Current packs/day: 0.00     Average packs/day: 1 pack/day for 15.0 years (15.0 ttl pk-yrs)  Types: Cigarettes     Start date: 05/03/1975     Quit date: 05/02/1990     Years since quitting: 33.3    Smokeless tobacco: Never   Substance and Sexual Activity    Alcohol use: Not Currently     Comment: occasionally has red wine    Drug use: Not Currently       Medications:      Current Facility-Administered Medications:     allopurinol tab 200 mg, 200 mg, Oral, Daily, Applebaum, Anise S., MD, 200 mg at 09/10/23 0816    ascorbic acid tab 500 mg, 500 mg, Oral, Daily with breakfast, Applebaum, Anise S., MD, 500 mg at 09/10/23 0816    aspirin chew tab 81 mg, 81 mg, Oral, Daily, Applebaum, Anise S., MD    atorvastatin tab 20 mg, 20 mg, Oral, Nightly, Applebaum, Anise S., MD    heparin 25,000 units in 0.45% NaCl 250 mL drip RTU, 1,000 Units/hr, Intravenous, Continuous, Kim, Maryelizabeth Smolder, PharmD    heparin per pharmacy, , Does not apply, Continuous, Shanon Darting., MD    polyethylene glycol pwd pkt 17 g, 17 g, Oral, Daily PRN, Applebaum, Anise S., MD    senna tab 1 tablet, 1 tablet, Oral, QHS PRN, Applebaum, Anise S., MD    vitamin D (cholecalciferol) tab 25 mcg, 25 mcg, Oral, Daily, Applebaum, Anise S., MD, 25 mcg at 09/10/23 0816    Allergies:   No Known Allergies    Objective:     Vital Signs:   Temp:  [36.3 ?C (97.3 ?F)-36.4 ?C (97.5 ?F)] 36.4 ?C (97.5 ?F)  Heart Rate:  [46-70] 66  Resp:  [12-18] 17  BP: (123-156)/(90-104) 123/90  NBP Mean:  [102-117] 102  SpO2:  [88 %-97 %] 97 %    Oxygen Support:  Oxygen Therapy  SpO2: 97 %  O2 Device: None (Room air)           System Normal Findings Abnormal Findings   General [x]  Normal general appearance   []  In acute distress  []  Chronically ill appearing  []  Sedated           []  Non Verbal  []  Other:   HENMT/Neck [x]  Oropharynx clear and moist  [x]  Normal JVP []  Dry mucous membranes  []  JVP ( cm)  []  Other:   Resp [x]  CTA B/L []  Intubated                 []  On Supp O2 ()  []  Wheezing ()  []  Rales ()   []  Crackles ()  []  Other:   CV [x]  Normal rhythm/rate  [x]  No murmurs  [x]  No lower extremity edema  []  Normal PMI   Normal pulses:   []  Radial []  Femoral  []  Pedal []   Abnormal rhythm ()  []   Murmur ()  []  S3    []  S4  Abnormal Pulses:  []  Radial ()  []  Femoral ()   []  DP ()  []  PT ()  []  Lower extremity edema       []  R ()   []  L ()  []  Bilateral ()  []  Other:   GI [x]  Soft  [x]  No tenderness to palpation  [x]  No rebound/guarding []  Tenderness ()  []  Fluid Wave  []  Hepatomegaly  Other:   MSK [x]  Normal strength/tone []  Other:    Skin [x]  No rash []  Other:   Neuro [x]  AO x 3  []  Normal strength  []  Normal sensation  []   Normal reflexes  []  Normal cerebellar exam []  AO x 0       []  AO x 1     []  AO x 2  Abnormal neuro exam:  []  Abnormal strength: 4/5 strength in LLE  []  Abnormal sensation  []  Abnormal reflexes  []  Abnormal cerebellar exam       Labs  No new labs  Cardio-diagnostics  ECHO 04/20/2019:  1. Left ventricle is normal in size with mild hypertrophy. Left ventricular   function is preserved and visually estimated to be 60-65%. There are no wall   motion abnormalities.   2. Normal biatrial size.   3. Normal right ventricular size and systolic function.   4. Bioprosthetic valve (25-mm Carpentier-Edwards Magna Ease ; 1/13; Dr. Gorge Laud)   is well seated in the aortic position. Peak velocity of 2.26 m/sec, mean   gradient of 11 mmHg, DI of 0.46, effective orifice area of 1.44 cm^2 (based   on LVOT diameter of 2cm)--consistent with normal prosthetic function.   5. Lack of an adequate TR signal precludes accurate estimation of right   ventricular systolic pressure. IVC diameter is = 2.1 cm with a > 50%   inspiratory collapse, suggestive of a right atrial pressure of 0-5 mmHg     ECHO 09/21/2020:   1. Normal LV size, mild septal hypertrophy, normal systolic function, normal LV diastolic function.   2. Left ventricular ejection fraction is approximately 60 to 65%.   3. Normal right ventricular size and normal systolic function.   4. A 25 mm Carpentier bioprosthetic valve is present in the aortic valve position. Echo findings are consistent with normal structure and function of the aortic prosthesis.   5. Normal pulmonary artery systolic pressure.   6. There are no prior echocardiograms available for comparison.     ECHO 09/06/2021:   1. Normal LV size, mild septal hypertrophy, normal wall motion and systolic function.   2. Left ventricular ejection fraction is approximately 60 to 65%.   3. Mild LV diastolic dysfunction (grade I, impaired relaxation).   4. Normal right ventricular size and grossly normal systolic function.   5. A 25 mm Carpentier bioprosthetic valve is present in the aortic valve position. It appears well seated. No paravalvular regurgitation. Peak aortic velocity is 1.9 m/sec and mean gradient is 8.0 mm Hg. By the continuity equation, the aortic valve area  is 1.74 cm? (Indexed AVA is 0.93 cm?/m?).   6. Upper normal aortic root (3.8 cm). Mildly enlarged mid ascending aorta (4.2 cm).   7. No significant valvular regurgitation.   8. Tricuspid regurgitant jet inadequate to assess pulmonary artery systolic pressure.   9. A prior echo performed on 09/21/2020 was reviewed for comparison. No significant changes noted since the previous study.    ECHO 12/29/2021:   1. Normal LV size, mild concentric left ventricular hypertrophy, normal systolic function, indeterminate diastolic function.   2. Left ventricular ejection fraction is approximately 60 to 65%.   3. Normal right ventricular size and normal systolic function.   4. Status post Carpentier bioprosthetic placement in the aortic valve position without regurgitation. Echo findings are consistent with normal structure and function of the aortic prosthesis. The aortic valve area is 1.39 cm? (Indexed AVA is 0.74 cm?/m?).   5. Trace tricuspid regurgitation and normal pulmonary artery systolic pressure.   6. A prior echo performed on 09/06/2021 was reviewed for comparison. The mean AV gradient has increased from 8 mmHg to 12 mmHg. Aortic valve was 1.74  cm2 in the previous study.     ECHO 09/07/2022  1. Normal LV systolic function. Normal left ventricular regional wall motion.  Left ventricular ejection fraction is approximately 60-65%.  2. Small LV size and moderate concentric left ventricular hypertrophy.  3. Mild LV diastolic dysfunction (grade I, impaired relaxation).  4. Not well seen right ventricular size and normal systolic function.  5. Normal atrial sizes.  6. A 25 mm Carpentier bioprosthetic valve is present in the aortic valve  position. Echo findings are consistent with normal structure and function of  the aortic prosthesis with mean gradient of 14 mmHg and AVA 1.26cm2.  7. Trace mitral valve regurgitation.  8. Trace to mild tricuspid valve regurgitation.  9. Tricuspid regurgitant jet inadequate to assess pulmonary artery systolic  pressure.  10. Normal mean pulmonary artery pressure by PVAT.  11. Mildly enlarged mid ascending aorta (4.1 cm).  12. A prior echo performed on 12/29/2021 was reviewed for comparison.  Carpentier valve measurements and findings as above similar to prior study.    ECHO 08/2023  CONCLUSIONS   1. Normal left ventricular size, moderate concentric left ventricular hypertrophy and normal LV systolic function.   2. LV ejection fraction is approximately 60 to 65%.   3. Abnormal LV diastolic function (Grade I).   4. A 25 mm Carpentier bioprosthetic valve is present in the aortic position. Echo findings are consistent with normal structure and function of the aortic prosthesis with mean gradient of 11 mmHg.   5. The peak TR velocity is not well defined, and therefore, pulmonary artery systolic pressure cannot be calculated.   6. Mildly dilated proximal ascending aorta (41 mm).   7. Prior examinations are available and were reviewed for comparison purposes. Compared to prior study on 09/07/22, there are no significant changes.    CT chest 09/21/2020:  Postoperative changes of aortic valve replacement. Ectasia of the ascending aorta measures up to 3.9 cm in the proximal arch grossly unchanged since 2013     ECG 06/07/2020:  Normal sinus rhythm at ventricular rate of 74 beats per minute.  Mild ST elevation in inferior leads.  Described before.    Assessment & Plan:     Yulian Gosney is a 68 y.o. 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    #Presyncope  #Tachy-brady SSS with multiple sinus pauses, longest 5 seconds  #Bifascicular Block  Per report by family, patient has had episodes of dizziness that correspond to tachycardia/bradycardia in OSH. Blood pressure has also been labile in these episodes. OSH records concerning for tachy-brady syndrome with 5 second pauses.  Diagnostics:  - Telemetry  - TSH, CMP to r/o reversible causes of sinus node dysfunction  - EP consult in AM  Therapeutics:   - Continue metoprolol 12.5mg  BID   - HOLD home apixaban 5mg  BID   - Start heparin gtt   - Plan for IP pacemaker placement (must be 48 hours post-apixaban)    #Bicuspid Aortic valve s/p aortic valve replacement in 2013  Normal valve function in most recent echo 08/2023. Gets serial imaging yearly.  - CTM    #History of CVA  #Left Lower Extremity Weakness  Patient with stable lower extremity weakness. Walks with walker.  - Continue aspirin 81mg  qdaily    #HLD  - Cont atorvastatin 20mg  qdaily  The patient was not consulted or assessed by a Registered Dietitian (RD) during this admission.     # IP Checklist  - Diet: Diet NPO Reason for NPO: Procedure; Except for: Medications  - VTE PPX: On therapeutic anticoagulation  - LDA:   Peripheral IV Posterior;Right Hand (1)    Advance Care Planning:   Full Code,  Primary Emergency Contact: Taft Fabry, Home Phone: [phone], Mobile Phone: [phone]    Disposition:  Expected post-hospitalization disposition will be to Home.    Discussed with Meriam Stamp., MD    Author:    Shanon Darting  09/10/2023 9:44 AM       ATTENDING CARDIOLOGY    Patient seen and examined in 7N-59. I performed a history and physical examination of the patient and discussed the patient's management with Dr. Janese Medicine. I reviewed his note and agree with the documented findings and plan of care which we developed.     The patient is a 68 year-old man s/p Magna-Ease #25 AVR for AS 04/05/2011 and post-operative atrial fibrillation and stroke who is transferred to [facility] for possible pacemaker for SSS.      Since his stroke at time of AVR in 2013, the patient's ambulation has been limited by left leg weakness and balance issues.  Over the past year he has noted increasing DOE when walking longer distances.  Since May he has had intermittent palpitations associated with lightheadedness but no syncope.  Over the past month he has been hospitalized twice and found to have episodes of paroxysmal atrial fibrillation with rapid ventricular rates as well episodes of symptomatic SSS.   He is transferred here for possible pacemaker placement.     His risk factors for atherosclerotic vascular disease: Age, gender, HTN since AVR.  He quit smoking in 1999 after a 30 pack-year smoking history.    ROS:  Possible blood transfusions at time of AVR.  No recent TIAs.     Physical examination: Skin: Warm, dry. Lungs: Symmetric expansion. No use of accessory muscles. Clear to auscultation and percussion. COR: JVP 8 cm. LVI not displaced. RRR+ii/vi MSM RUSB. No carotid bruits. PT pulses 1+/=. ABD: Active bs. Nontender, nondistended. EXTR: Digits without clubbing or cyanosis. No edema below the knees bilaterally.     EKG done 08/21/2023 and reviewed by me:  AF, ventricular rate 127.  RBBB.    EKG done 69/2025 2323 and reviewed by me: NSR, rate 68.    Impression and recommendations are as dictated above.    Melida Sprain. Drexel Gentles, M.D.

## 2023-09-10 NOTE — Consults
 CASE MANAGER ASSESSMENT      Admit YNWG:[id number]    Date of Initial CM Assessment: 09/10/2023    Problems: Active Problems:    * No active hospital problems. *       Past Medical History:   Diagnosis Date    CVA (cerebral vascular accident) (HCC/RAF)     H/O aortic valve replacement     Kidney stone     Urinary frequency     Past Surgical History:   Procedure Laterality Date    AORTIC VALVE REPLACEMENT            Primary Care Physician:Talishinsky, Aleksandr, MD  [phone]  A CM introduction was made to the patient, describing the role CMs play within the medical team.   If the CM or other team members is unable to contact the patient via the patient's personal telephone number or room telephone, they may contact the emergency numbers listed on the face sheet.  It appears that all the patient's questions were answered satisfactorily.       LANGUAGE ASSISTANCE:   Printmaker Used?: Patient declined    NEEDS ASSESSMENT:   Is the patient able to participate in the CM Initial Assessment at this time?: Yes  Information Obtained From: Patient, Spouse    Level of Function Prior to Admit: Minimal Assist    Primary Living Situation: Lives w/Capable Spouse    Hours of Assistance/Day: pt has h/o stroke and spouse takes care of the pt at home, no other assistance.    Pre-admission Living Situation: Home/Apartment, 1-Story       Primary Support Systems: Spouse/significant other    Contact Name: Ulric, Salzman Phone Number: [phone]   Does the patient have a Family/Support System member participating in Discharge Planning?: Yes    DPOA?: No       Bathroom on Main Floor: Yes  Stairs in Home: 2       Prior Treatments / Services: Home Health    Home Health Type of Service(s) Being Provided: Nursing, Physical Therapy, Occupational Therapy, Speech Therapy  Home Health Agency Name:  (unable to recall, pt had HH arranged by Mariette Shorts)        Who is your PCP?: Talishinsky, Aleksandr    Do you have your Primary Care Doctor's office number?: Yes    How often do you visit your doctor?:  (as needed)    Do you need information/education regarding your medical condition?: No         DISCHARGE ASSESSMENT:     Projected Date of Discharge: 09/12/2023    Anticipated Complex D/C?: No    Projected Discharge to: Home    Discharge Address: [address]  [place] [address]    Projected Discharge Needs: Home Health    Support Identified at Discharge: Spouse  Name of Discharge Support Person: Niles, Ess Phone Number: [phone]     Who is available to transport you upon discharge?: Family Transportation         SDOH     Within the past 12 months, has a lack of transportation kept you from medical appointments, meetings, work, or from getting things needed for daily living? : No  How hard is it for you to pay for the very basics like food, housing, medical care, and heating?: Not very hard  How hard is it for you to pay for prescriptions or medical bills?: Not very hard  In the past 12 months has the electric,  gas, oil, or water company threatened to shut off services in your home?: No  In the last 12 months, was there a time when you were not able to pay the mortgage or rent on time?: No  In the last 12 months, how many places have you lived?: 1  In the last 12 months, was there a time when you did not have a steady place to sleep or slept in a shelter (including now)?: No                  II Manley Seeds)  MSN RN    Department of Care Coordination and Clinical Social Work   Medicine Case Production designer, theatre/television/film  Primary Team: CCU/COU GOLD  T: [phone]  C: [phone]  Pager: [phone]  Vina: [phone]  Email  [email or web address]          After Hours Case Management Contacts:    Swing Shift (M-F): Pager [phone]    Weekend: Pager [phone]    ED Case Manager: Pager [phone]

## 2023-09-10 NOTE — H&P
 Hospitalist History and Physical    Patient Name Andrew [name]   Patient MRN [id number]   Patient DOB November 13, 1955   Patient PCP Maggie Schooner, MD   Attending Mitchel An, MD   Admission Date 09/09/2023       Chief Complaint  No chief complaint on file.    History of Present Illness  Andrew [name] is a 68 y.o. male who  has a past medical history of CVA (cerebral vascular accident) (HCC/RAF), H/O aortic valve replacement, Kidney stone, and Urinary frequency. who presents as outside hospital transfer for evaluation for PPM implantation for tachy-brady syndrome  History obtained from patient, chart review and discussion with ED/transferring physicians.    Approximately 1.5 weeks ago, pt experiences onset of dizziness and palpitations, was taken by EMS to [facility], where he was found to be intermittently in AF w/ RVR, with intermittent pauses up to five seconds, and NSR with labile heart rates. Prior to this, he has had multiple OSH encountered with labile heart rates with associated symptoms largely in sinus rhythm. Cardiology was consulted who recommended he be transferred to [facility] for continuity and evaluation for PPM implantation. Available telemetry records reveal NSR with intermittent AF w/ RVR with pauses while in AF, no strips available with pauses over 5 seconds, though such pauses are documented in records. He was transferred to CCU/COU service on 6/9 and arrived in sinus rhythm.  While on COU service, he was placed on telemetry, converted from apixaban to heparin gtt, continued on metoprolol 12.5mg  PO BID. EP was consulted, recommending challenge with amiodarone gtt to assess tolerance prior to possible PPM implantation. Pt was transferred to hospitalist service d/t cardiology service overcapacity.    On interview, pt encountered in NAD, reports feels well, dizziness has resolved. He looks forward to further evaluation for PPM. Denies CP, LE swelling, dyspnea, orthopnea, PND, palpitations, lightheadedness, presyncope/syncope.    Review of systems negative at time of admission except as stated above per HPI.    ED Course:  ED Triage Vitals [09/09/23 2312]   Temp Temp Source BP Heart Rate Resp SpO2 O2 Device Pain Score Weight   36.3 ?C (97.3 ?F) Oral 142/104 63 18 93 % None (Room air) Zero 87.8 kg (193 lb 9 oz)         PAST MEDICAL HISTORY:  Past Medical History:   Diagnosis Date    CVA (cerebral vascular accident) (HCC/RAF)     H/O aortic valve replacement     Kidney stone     Urinary frequency         PAST SURGICAL HISTORY:  Past Surgical History:   Procedure Laterality Date    AORTIC VALVE REPLACEMENT          MEDICATIONS:  Medications Prior to Admission   Medication Sig Dispense Refill Last Dose/Taking    ALLOPURINOL 100 mg tablet TAKE 2 TABLETS BY MOUTH EVERY DAY 180 tablet 1 Taking    AMLODIPINE 5 mg tablet TAKE 1 TABLET (5 MG TOTAL) BY MOUTH DAILY. 90 tablet 3 Taking    apixaban 5 mg tablet Take 1 tablet (5 mg total) by mouth two (2) times daily.   Taking    ascorbic acid 500 mg tablet Take 1 tablet (500 mg total) by mouth daily.   Taking    aspirin 81 mg EC tablet Take 1 tablet (81 mg total) by mouth daily. 90 tablet 2 Taking    ATORVASTATIN 20 mg tablet TAKE 1 TABLET BY MOUTH EVERYDAY AT  BEDTIME 90 tablet 3 Taking    CALCIUM PO Take 600 mg by mouth two (2) times daily.   Taking    carvedilol 6.25 mg tablet Take 1 tablet (6.25 mg total) by mouth two (2) times daily. 180 tablet 3 Taking    dilTIAZem (TIAZAC) 120 mg 24 hr capsule Take 1 capsule (120 mg total) by mouth daily.   Taking    losartan 25 mg tablet Take 1 tablet (25 mg total) by mouth two (2) times daily. 180 tablet 3 Taking    Multiple Vitamins-Minerals (HCA SUPER THERAVITE-M PO) Take 1 tablet by mouth daily.   Taking    pantoprazole 20 mg DR tablet Take 1 tablet (20 mg total) by mouth daily. (Patient taking differently: Take 1 tablet (20 mg total) by mouth daily as needed (heartburn).) 30 tablet 0 Past Month    vitamin D, cholecalciferol, 25 mcg (1000 units) tablet Take 1 tablet (25 mcg total) by mouth daily.   Taking    amoxicillin 500 mg capsule Use as directed prior to dental appointment   More than a month       ALLERGIES:  No Known Allergies    FAMILY HISTORY:  Family History   Problem Relation Age of Onset    No Known Problems Mother     Other (gastric ulcer) Father     Uterine cancer Sister     Stroke Brother     Kidney failure Brother     Colon cancer Neg Hx     Lung cancer Neg Hx     Prostate cancer Neg Hx    .    SOCIAL HISTORY:  Social History     Occupational History    Not on file   Tobacco Use    Smoking status: Former     Current packs/day: 0.00     Average packs/day: 1 pack/day for 15.0 years (15.0 ttl pk-yrs)     Types: Cigarettes     Start date: 05/03/1975     Quit date: 05/02/1990     Years since quitting: 33.3    Smokeless tobacco: Never   Substance and Sexual Activity    Alcohol use: Not Currently     Comment: occasionally has red wine    Drug use: Not Currently    Sexual activity: Not on file       Care Coordination Note (if present): As below  No Patient Care Coordination Note on file.      Review of Systems  A 14-point review of systems was completed and was negative and/or non-contributory except as otherwise stated above.    Physical Examination  Vital Signs: Temp:  [36.3 ?C (97.3 ?F)-36.4 ?C (97.5 ?F)] 36.3 ?C (97.3 ?F)  Heart Rate:  [46-70] 64  Resp:  [12-18] 13  BP: (123-156)/(90-104) 142/99  NBP Mean:  [102-117] 111  SpO2:  [88 %-97 %] 97 %  Gen: in no apparent distress, cooperative, conversant  Eyes: sclera anicteric, conjunctiva noninjected, pupils equal in diameter  HENT: hearing intact grossly, MMM, no oropharyngeal erythema or exudate  CV: RRR, no murmurs appreciated, no LE edema bilaterally  Lung: normal work of breathing without accessory muscle use, CTAB without wheezes, rales, or rhonchi   GI: soft, nondistended, nontender to palpation, no hepatosplenomegaly  Skin: no rashes, warm to touch  Psych: mood and affect congruent to context; oriented, cooperative, judgment and insight intact  Neuro: AOx4, PERRL    Labs    Labs were ordered and personally reviewed. Labs  were compared to prior records obtained from chart and/or outside records when available.      Last 3 CBC    Hemoglobin Lab Results  (Last 360 days)        Today 0430 04/29 2146    Result             13.8                       14.7                     Hematocrit Lab Results  (Last 360 days)        Today 0430 04/29 2146    Result             41.3                       44.6                     Mean Corpuscular Volume Lab Results  (Last 360 days)        Today 0430 04/29 2146    Result             84.6                       86.4                     Platelet Count Lab Results  (Last 360 days)        Today 0430 04/29 2146    Result             164                       192                     Red Blood Cell Count Lab Results  (Last 360 days)        Today 0430 04/29 2146    Result             4.88                       5.16                     White Blood Cell Count                  Last 3 BMP    Sodium Lab Results  (Last 360 days)        Today 0430 04/29 2146 02/22 0919    Result             141                       143                       137                     Potassium Lab Results  (Last 360 days)        Today 0430 04/29 2146 02/22 0919    Result             3.8  3.9                       3.8                     Chloride Lab Results  (Last 360 days)        Today 0430 04/29 2146 02/22 0919    Result             105                       103                       100                     Carbon Dioxide Lab Results  (Last 360 days)        Today 0430 04/29 2146 02/22 0919    Result             26                       23                       28                      Glucose Lab Results  (Last 360 days)        Today 0430 02/22 0919    Result             90                       104                     Creatinine Lab Results  (Last 360 days)        Today 0430 04/29 2146 02/22 0919    Result             0.73                       0.72                       0.69                     BUN (Urea Nitrogen) Lab Results  (Last 360 days)        Today 0430 04/29 2146 02/22 0919    Result             14                       17                       12                      Calcium Lab Results  (Last 360 days)        Today 0430 04/29 2146 02/22 0919    Result             9.1                       8.7  8.9                         Last LFTs   Lab Results   Component Value Date    AST 35 09/10/2023    AST 25 07/30/2023    AST 24 05/25/2023    ALT 109 (H) 09/10/2023    ALT 31 07/30/2023    ALT 27 05/25/2023    BILITOT 0.5 09/10/2023    BILITOT 0.5 07/30/2023    BILITOT 0.6 05/25/2023    BILICON 0.2 09/10/2023    BILICON <0.2 07/30/2023    ALBUMIN 3.9 09/10/2023    ALBUMIN 4.3 07/30/2023    ALBUMIN 4.3 05/25/2023    TOTPRO 7.1 09/10/2023    TOTPRO 7.8 07/30/2023    TOTPRO 7.5 05/25/2023    ALKPHOS 75 09/10/2023    ALKPHOS 66 07/30/2023    ALKPHOS 67 05/25/2023         Last Coagulation Panel-    Lab Results   Component Value Date    PT 13.3 09/10/2023    INR 1.0 09/10/2023    APTT 34.6 09/10/2023   (    Last HgA1c:  Lab Results   Component Value Date    HGBA1C 6.4 (H) 05/25/2023       UA:  No results for input(s): ''SPECGRAVUR'', ''PHUR'', ''BLDUR'', ''KETONESUR'', ''GLUCOSEUR'', ''PROTCLUR'', ''NITRITEUR'', ''LEUKESTUR'', ''RBCSUR'', ''WBCSUR'' in the last 72 hours.    Brief Renal Results History-    Last 3 serum Creatinine Results-  Lab Results  (Last 360 days)        Today 0430 04/29 2146 02/22 0919    Result             0.73                       0.72                       0.69                        Microbiology  Microbiology data was ordered and personally reviewed. Prior microbiology data was reviewed when available.    No results found for this or any previous visit (from the past 8 weeks).    Imaging  Imaging was ordered and personally reviewed. Imaging was compared to prior imaging when available.    Imaging Resulted in past 24 hours-  No imaging has been resulted in the last 24 hours    Last TTE in last 36 months-  Echo adult transthoracic complete  Result Date: 08/13/2023   1. Normal left ventricular size, moderate concentric left ventricular hypertrophy and normal LV systolic function.  2. LV ejection fraction is approximately 60 to 65%.  3. Abnormal LV diastolic function (Grade I).  4. A 25 mm Carpentier bioprosthetic valve is present in the aortic position. Echo findings are consistent with normal structure and function of the aortic prosthesis with mean gradient of 11 mmHg.  5. The peak TR velocity is not well defined, and therefore, pulmonary artery systolic pressure cannot be calculated.  6. Mildly dilated proximal ascending aorta (41 mm).  7. Prior examinations are available and were reviewed for comparison purposes. Compared to prior study on 09/07/22, there are no significant changes. [id number] Terrye Fiedler MD Electronically signed by [id number] Terrye Fiedler MD on 08/13/2023 at 10:14:50 AM  Sonographer: Viki Graver   Final  Last Renal US  in past year (if present)-  No US  Kidney in the last 365 days    ECG  ECG was ordered and personally reviewed by me. ECG was compared to prior tracings when available.    Last order of ECG 12-LEAD was found on 09/10/2023 from Hospital Encounter on 09/02/2023         Assessment and Plan    Kash Davie is a 68 y.o. male with a pertinent PMH as above who presented on 09/10/23 with -    Active Problem List:    #Presyncope  #Tachy brady syndrome  #Paroxysmal AF by Hx  - EP following  - HELD metoprolol 12.5mg  PO BID  - Consider amiodarone gtt if can tolerate, pt in NSR with resting rates in low 60's high 50's as of 6/10, trial deferred 6/10  - Hold apixaban, heparin gtt  in anticipation of PPM implantation  - home diltiazem and carvedilol held on admission  - Possible PPM implantation during hospitalization, not expected for 6/11 per discussion with EP    -- CHRONIC/STABLE --    #Hx of bicuspid AV s/p bioprosthetic SAVR  #Hx of CVA c/p LE weakness: home ASA, atorva  #HLD: home atorvastatin  #Gout by Hx  #Hx of HTN: hold home amlodipine, home losartan 25mg  PO BID held  #HX of medial malleolus fracture  #Hx of osteoporosis: home calcium-vitamin D, f/u PCP for repeat DEXA, not on bisphosphonate    Inpatient Checklist  Diet: Orders Placed This Encounter      Diet regular    DVT prophylaxis: therapeutic anticoagulation, see above  Lines/tubes/drains:  Peripheral IV Posterior;Right Hand (1)      Code Status: Full Code  Contact:  Primary Emergency Contact: Taft Fabry, Home Phone: [phone]  PCP: Maggie Schooner, MD        Anticipated Disposition  Admit to Inpatient. I anticipate patient will be hospitalized for greater than two midnights.      78 minutes were spent personally by me today on this encounter which include today's pre-visit review of the chart, obtaining appropriate history, performing an evaluation, documentation and discussion of management with external physicians with details supported within the note for today's visit. The time documented was exclusive of any time spent on any separately billed procedure.    Mitchel An, MD, MPHTM  Assistant Clinical Professor of Medicine  Dept of Medicine  09/10/2023 2:34 PM

## 2023-09-10 NOTE — Progress Notes
 Pharmaceutical Services - Admission Medication Reconciliation Note      Patient Name: Andrew [name]  Medical Record Number: [id number]  Admit date: 09/09/2023 11:08 PM    Age: 68 y.o.  Sex: male  Allergies: No Known Allergies  Height:   Most recent documented height   08/09/23 1.676 m (5' 6'')     Actual Weight:   Most recent documented weight   09/09/23 87.8 kg   08/09/23 91.6 kg     Diagnosis: The patient is currently admitted with the following concerns/issues: Active Problems:    * No active hospital problems. *      Reported Medication History     The patient was admitted to an OSH on 08/31/2023 and transferred to [facility] for HLOC on 09/09/2023 for this current admission.     I spoke with the patient and his wife at bedside and used Surescripts (outpatient Rx fill history database) to update the home medication list prior to OSH admission on 08/31/2023.    PTA Medication List (discrepancies are noted) - medications highlighted in BLUE below are medications that the patient was actually taking in the outpatient setting, prior to this admission  Medications Prior to Admission   Medication Sig Note (note written date) and Reconciliation with the current meds on the [facility] as of today, 09/10/2023  Last Dose/Taking    ALLOPURINOL 100 mg tablet TAKE 2 TABLETS BY MOUTH EVERY DAY Currently resumed inpatient  Taking    AMLODIPINE 5 mg tablet TAKE 1 TABLET (5 MG TOTAL) BY MOUTH DAILY. Currently not ordered inpatient  This medication is not on the OSH 09/09/2023 discharge med list Taking    apixaban 5 mg tablet    Take 1 tablet (5 mg total) by mouth two (2) times daily.  Currently resumed inpatient  Taking    ascorbic acid 500 mg tablet Take 1 tablet (500 mg total) by mouth daily. Currently resumed inpatient  Taking    aspirin 81 mg EC tablet Take 1 tablet (81 mg total) by mouth daily. Chewable tablets are currently ordered inpatient  Taking    ATORVASTATIN 20 mg tablet TAKE 1 TABLET BY MOUTH EVERYDAY AT BEDTIME Currently resumed inpatient  Taking    CALCIUM PO Take 600 mg by mouth two (2) times daily. Currently not ordered inpatient  Taking    carvedilol 6.25 mg tablet Take 1 tablet (6.25 mg total) by mouth two (2) times daily. Metoprolol succinate is on the OSH discharge med list  Metoprolol tartrate 12.5 mg po BID was initially ordered inpatient but the inpatient order was subsequently discontinued - reassess beta blocker as clinically appropriate   Taking    dilTIAZem (TIAZAC) 120 mg 24 hr capsule Take 1 capsule (120 mg total) by mouth daily. Currently not ordered inpatient   This medication is not on the OSH 09/09/2023 discharge med list Taking    losartan 25 mg tablet Take 1 tablet (25 mg total) by mouth two (2) times daily. Currently not ordered inpatient - assess if/when clinically appropriate to continue during admission  Taking    Multiple Vitamins-Minerals (HCA SUPER THERAVITE-M PO) Take 1 tablet by mouth daily. Currently not ordered inpatient  Taking    pantoprazole 20 mg DR tablet Take 1 tablet (20 mg total) by mouth daily.   (Patient taking differently: Take 1 tablet (20 mg total) by mouth daily as needed (heartburn).) 09/10/2023: Wife reported patient saw MD and was prescribed this medication b/c MD thought heartburn was contributing to chest pain.  Was told to use PRN. Patient only took a couple doses  Currently not ordered inpatient  Past Month    vitamin D, cholecalciferol, 25 mcg (1000 units) tablet Take 1 tablet (25 mcg total) by mouth daily. Currently resumed inpatient  Taking    amoxicillin 500 mg capsule Use as directed prior to dental appointment Currently not ordered inpatient  More than a month    benzonatate 200 mg capsule Take 1 capsule (200 mg total) by mouth three (3) times daily as needed for Cough. (Patient not taking: Reported on 09/10/2023) 09/10/2023: Prescribed for short duration only - patient completed   Currently not ordered inpatient  Not Taking    celecoxib 200 mg capsule Take 1 capsule (200 mg total) by mouth two (2) times daily as needed for Pain. (Patient not taking: Reported on 09/10/2023) 09/10/2023: Prescribed for short duration only (for an ankle fracture) - patient completed   Currently not ordered inpatient  Not Taking    FOLIC ACID PO Take 1,000 mcg by mouth daily. (Patient not taking: Reported on 09/10/2023) 09/10/2023: Patient reported no longer taking   Currently not ordered inpatient  Not Taking       Discharge Prescription Preference:   [id number] - [place], [place] - [address]  [address]  [place] [address]        The patient's allergies and medications have been reviewed and updated. Beers criteria reviewed with the patient's prior to admission medications.    The reconciliation of admission orders with PTA med list is complete.     I also reviewed the OSH discharge summary med list.    There are no issues requiring follow up at this time.    Kathreen Pare, PharmD, 09/10/2023, 8:36 AM  Transitions of Care Pharmacist  [facility]   [facility]  [address]  [place], [place] [address]  Phone: [phone]  Pager x [phone]    Please note, accuracy is based on the patient?s/family's/caregiver's ability and willingness to provide this information at the time of interview. This progress note represents our good faith effort to obtain the best possible medication history from all attainable sources at the time of reconciliation.    Medication Dispense History (from 03/14/2023 to 09/09/2023)       Allopurinol          Dispensed Days Supply Quantity Provider Pharmacy    ALLOPURINOL 100 MG TABLET 08/30/2023 90 180 device Alyson Back., MD [facility] [phone] - S...    ALLOPURINOL 100 MG TABLET 05/29/2023 90 180 device Alyson Back., MD [facility] [phone] - H...                    amLODIPine Besylate          Dispensed Days Supply Quantity Provider Pharmacy    AMLODIPINE BESYLATE 5 MG TAB 07/10/2023 90 90 device Vucicevic, Darko, MD [facility] [phone] - H...    AMLODIPINE BESYLATE 5 MG TAB 04/03/2023 90 90 device Vucicevic, Darko, MD [facility] [phone] - H...                    Apixaban          Dispensed Days Supply Quantity Provider Pharmacy    ELIQUIS 5 MG TABLET 08/24/2023 30 60 device Rana, Humera, MD [facility] [phone] - [facility]  Atorvastatin Calcium          Dispensed Days Supply Quantity Provider Pharmacy    ATORVASTATIN 20 MG TABLET 08/22/2023 90 90 device Azalee Lenz., MD [facility] [phone] - S...    ATORVASTATIN 20 MG TABLET 06/01/2023 90 90 device Azalee Lenz., MD [facility] [phone] - H...                    Benzonatate          Dispensed Days Supply Quantity Provider Pharmacy    BENZONATATE 200 MG CAPSULE 08/17/2023 10 30 device Thurmond Floro., MD [facility] [phone] - S...                    Carvedilol          Dispensed Days Supply Quantity Provider Pharmacy    CARVEDILOL 6.25 MG TABLET 07/10/2023 90 180 device Vucicevic, Darko, MD [facility] [phone] - H...    CARVEDILOL 6.25 MG TABLET 04/03/2023 90 180 device Vucicevic, Darko, MD [facility] [phone] - H...                    Celecoxib          Dispensed Days Supply Quantity Provider Pharmacy    CELECOXIB 200 MG CAPSULE 08/17/2023 30 60 device Thurmond Floro., MD [facility] [phone] - S...                    dilTIAZem HCl ER Beads          Dispensed Days Supply Quantity Provider Pharmacy    DILTIAZEM 24HR ER 120 MG CAP 08/24/2023 30 30 device Rana, Humera, MD [facility] [phone] - S...                    Losartan Potassium          Dispensed Days Supply Quantity Provider Pharmacy    LOSARTAN POTASSIUM 25 MG TAB 08/09/2023 90 180 device Vucicevic, Darko, MD [facility] [phone] - H...    LOSARTAN POTASSIUM 25 MG TAB 07/13/2023 30 60 device Vucicevic, Darko, MD [facility] [phone] - H...    LOSARTAN POTASSIUM 25 MG TAB 06/03/2023 90 90 device Vucicevic, Darko, MD [facility] [phone] - H...    LOSARTAN POTASSIUM 25 MG TAB 04/06/2023 90 90 device Vucicevic, Darko, MD [facility] [phone] - H...                    Pantoprazole Sodium          Dispensed Days Supply Quantity Provider Pharmacy    PANTOPRAZOLE SOD DR 20 MG TAB 08/05/2023 30 30 device Vikki Graves., MD [facility] [phone] - H...                          Disclaimer    Certain information may not be available or accurate in this report, including over-the-counter medications, low cost prescriptions, prescriptions paid for by the patient or non-participating sources, or errors in insurance c laims information. The provider should independently verify medication history with the patient.       External Sources       Source Last Checked for Updates Status    External Dispenses (Ambulatory) 03/11/2012  1:40 AM No History Available    Surescripts (Ambulatory) 09/09/2023  8:41 AM History Response Filed    Surescripts (Acute Care) 09/09/2023 11:09 PM History Response Filed    External Claim 09/10/2023  8:42 AM  External Claim 09/10/2023  8:42 AM     External Claim 09/10/2023  8:42 AM     External Claim 09/10/2023  8:42 AM

## 2023-09-10 NOTE — Consults
 Inpatient Cardiac Electrophysiology Consultation    DATE OF SERVICE: 09/10/2023  PATIENT: Cayne Yom   MRN: [id number]  DOB: 08/07/1955    REFERRING PHYSICIAN: Meriam Stamp., MD    REASON FOR CONSULTATION: History of syncope, rapid atrial fibrillation, and sinus bradycardia    64M admitted for further management of rapid atrial fibrillation     MEDICAL PROBLEMS  Paroxysmal atrial fibrillation (CHADSVASc score = 4 due to age, stroke, and hypertension)  Anticoagulation: apixaban 5mg  twice daily  Rate control: diltiazem and previously metoprolol  History of bicuspid aortic valve stenosis  Status post surgical, bioprosthetic aortic valve replacement January 2013 [facility] Ease #25; Dr. Gorge Laud; [facility])  Postoperative stroke, with residual left-sided and right lower extremity weakness    HISTORY OF PRESENT ILLNESS:    Index diagnosis of arrhythmia was that of paroxysmal atrial fibrillation following elective surgical bioprosthetic aortic valve replacement at [facility] in 2013; he did not have recurrence following this isolated episode and thus was not managed with antiarrhythmic nor anticoagulation regimen.     He presented to the hospital twice more in May 2025, once for treatment of dyspnea and suspected heart failure, and again for palpitation, where he was diagnosed with recurrence of atrial fibrillation and rapid ventricular rates; this was managed with diltiazem and apixaban. As he re-presented for palpitation and dizziness on Aug 31, 2023 to [facility]) he was treated with IV diltiazem and found to have subsequent pauses up to 5 seconds' duration. He does not recall symptoms, but rather than he was given a diagnosis of tachy-brady syndrome and recommended to undergo pacemaker placement.       PAST MEDICAL HISTORY:  Past Medical History:   Diagnosis Date    CVA (cerebral vascular accident) (HCC/RAF)     H/O aortic valve replacement     Kidney stone     Urinary frequency PAST SURGICAL HISTORY:  Past Surgical History:   Procedure Laterality Date    AORTIC VALVE REPLACEMENT         FAMILY HISTORY:  Family History   Problem Relation Age of Onset    No Known Problems Mother     Other (gastric ulcer) Father     Uterine cancer Sister     Stroke Brother     Kidney failure Brother     Colon cancer Neg Hx     Lung cancer Neg Hx     Prostate cancer Neg Hx        SOCIAL HISTORY:  Social History     Socioeconomic History    Marital status: Married   Tobacco Use    Smoking status: Former     Current packs/day: 0.00     Average packs/day: 1 pack/day for 15.0 years (15.0 ttl pk-yrs)     Types: Cigarettes     Start date: 05/03/1975     Quit date: 05/02/1990     Years since quitting: 33.3    Smokeless tobacco: Never   Substance and Sexual Activity    Alcohol use: Not Currently     Comment: occasionally has red wine    Drug use: Not Currently     Social Drivers of Health     Financial Resource Strain: Low Risk  (09/10/2023)    Financial Resource Strain     Difficulty of Paying Living Expenses: Not very hard       OUTPATIENT MEDICATIONS:  Current Facility-Administered Medications   Medication Dose Route Frequency    allopurinol  tab 200 mg  200 mg Oral Daily    ascorbic acid tab 500 mg  500 mg Oral Daily with breakfast    aspirin chew tab 81 mg  81 mg Oral Daily    atorvastatin tab 20 mg  20 mg Oral Nightly    heparin 25,000 units in 0.45% NaCl 250 mL drip RTU  1,000 Units/hr Intravenous Continuous    heparin per pharmacy   Does not apply Continuous    [COMPLETED] magnesium sulfate 2 g in water for injection 50 mL RTU  2 g Intravenous Once    polyethylene glycol pwd pkt 17 g  17 g Oral Daily PRN    [COMPLETED] potassium chloride ER tab 20 mEq  20 mEq Oral Once    senna tab 1 tablet  1 tablet Oral QHS PRN    [COMPLETED] sodium chloride 0.9% IV soln        vitamin D (cholecalciferol) tab 25 mcg  25 mcg Oral Daily    [DISCONTINUED] apixaban tab 5 mg  5 mg Oral BID    [DISCONTINUED] atorvastatin tab 20 mg  20 mg Oral Nightly    [DISCONTINUED] metoprolol tartrate tab 12.5 mg  12.5 mg Oral BID    [DISCONTINUED] polyethylene glycol pwd pkt 17 g  17 g Oral Daily        ALLERGIES:  No Known Allergies    INPATIENT SCHEDULED MEDICATIONS:   allopurinol  200 mg Oral Daily    ascorbic acid  500 mg Oral Daily with breakfast    aspirin  81 mg Oral Daily    atorvastatin  20 mg Oral Nightly    cholecalciferol  25 mcg Oral Daily         PHYSICAL EXAMINATION:  VITALS: BP 142/99  ~ Pulse 64  ~ Temp 36.3 ?C (97.3 ?F) (Oral)  ~ Resp 13  ~ Wt 193 lb 9 oz (87.8 kg)  ~ SpO2 97%  ~ BMI 31.24 kg/m?    Temp:  [36.3 ?C (97.3 ?F)-36.4 ?C (97.5 ?F)] 36.3 ?C (97.3 ?F)  Heart Rate:  [46-70] 64  Resp:  [12-18] 13  BP: (123-156)/(90-104) 142/99  NBP Mean:  [102-117] 111  SpO2:  [88 %-97 %] 97 %        Intake/Output Summary (Last 24 hours) at 09/10/2023 1308  Last data filed at 09/10/2023 1202  Gross per 24 hour   Intake 374.67 ml   Output 1275 ml   Net -900.33 ml       General: well developed well nourished in no acute distress  Eyes: extraocular movements intact, anicteric.  ENT: oropharynx clear, moist mucous membranes  Neck: JVP not assessed. No lymphadenopathy  CV: Regular rhythm by peripheral exam  Resp: Good air entry throughout all fields bilaterally, no wheezes or crackles.  GI: abd soft, non tender, non distended, normoactive bowel sounds  MSK: extremities without clubbing or cyanosis.  No significant edema  Derm: warm, dry , and well perfused  Neuro: grossly non focal      LABORATORY:  Lab Results   Component Value Date    WBC 4.68 09/10/2023    HGB 13.8 09/10/2023    HCT 41.3 09/10/2023    MCV 84.6 09/10/2023    PLT 164 09/10/2023    Lab Results   Component Value Date    K 3.8 09/10/2023    MG 1.7 09/10/2023    CREAT 0.73 09/10/2023    Lab Results   Component Value Date    ALT 109 (  H) 09/10/2023    AST 35 09/10/2023    ALKPHOS 75 09/10/2023    BILITOT 0.5 09/10/2023      Lab Results   Component Value Date    INR 1.0 09/10/2023    APTT 34.6 09/10/2023    Lab Results   Component Value Date    TSH 4.1 09/10/2023    Lab Results   Component Value Date    HGBA1C 6.4 (H) 05/25/2023     Lab Results   Component Value Date    TROPONIN 15 (H) 07/31/2023         DIAGNOSTIC STUDIES:    12-lead ECG 09/09/2023: sinus rhythm at 70 bpm with PR interval and QRS duration 130-173ms:         Prior ECGs dating back to 2022 demonstrated sinus rhythm at 74 bpm with PR interval and QRS duration in a right bundloid pattern:        TTE 08/08/2023: LV EF 60-65% with normal LV size and moderate concentric LVH.       ASSESSMENT:    36M admitted for further management of rapid atrial fibrillation     MEDICAL PROBLEMS    Paroxysmal atrial fibrillation (CHADSVASc score = 4 due to age, stroke, and hypertension)  Anticoagulation: apixaban 5mg  twice daily  Rate control: diltiazem and previously metoprolol  History of bicuspid aortic valve stenosis  Status post surgical, bioprosthetic aortic valve replacement January 2013 [facility] Ease #25; Dr. Gorge Laud; [facility])  Postoperative stroke, with residual left-sided and right lower extremity weakness    DISCUSSION:    Cardiac Electrophysiology was consulted for management of paroxysmal atrial fibrillation with rapid ventricular rates, and reported pauses noted on telemetry.    Recommend procurement of telemetry data from [facility] (from 5/31 to 09/09/2023); asymptomatic conversion pauses would not be a standalone indication for pacemaker implantation, but sinus node dysfunction or symptomatic bradycardia limiting up-titration of medical therapy for rate and rhythm control of atrial fibrillation would be reasonable criteria for such.     RECOMMENDATIONS:  Recommend procurement of telemetric data from [facility] (5/31-09/09/2023) to evaluate for pauses noted.   Maintain on telemetry while inpatient.   To treat atrial fibrillation and evaluate for tolerance, recommend initiation of amiodarone infusion 1mg /min for 24 hours, followed by 400mg  twice daily for one week (until approximately 6/17), followed by 200mg  daily thereafter.   Maintain bilateral, functional upper extremity peripheral IV catheters.   Will continue to follow to evaluate for pacemaker implantation indication(s).     Kemper Patterson, MD  Clinical Cardiac Electrophysiology Fellow        Attending Addendum: Agree with assessment and plan as documented above which I helped formulate.      Judith Novak, MD  Cardiac Electrophysiology Attending

## 2023-09-10 NOTE — Nursing Note
 Pt arrived to unit via ambulance, VSS, pt arrived w pt belongings, wife @ bedside, admissions completed, MD notified, call light within reach, CHG performed, 12 lead EKG taken, maintained comfort and safety

## 2023-09-10 NOTE — Progress Notes
 Pharmaceutical Services - Heparin Consult Management Note    Objective Data   Allergies: Patient has no known allergies.  Height:   Most recent documented height   08/09/23 1.676 m (5' 6'')     Actual Weight:   Most recent documented weight   09/09/23 87.8 kg       Heparin drip administrations (last 48 hours)       None            Anticoagulant/Antiplatelet administrations (last 48 hours)       None            Current Labs  Recent Labs     09/10/23  0430   BILITOT 0.5   HGB 13.8   PLT 164     No results found for: ''HEPPLTAB''    Assessment:  Protocol: Low Intensity: Anti-Xa goal 0.3-0.5 or aPTT goal 66-87  Anti-Xa aPTT Re-bolus if Ordered  (NTE 5,000 units*) /  Hold Infusion Rate Change Check Anti-Xa/aPTT   <0.1 <46 25 units/kg Increase by 3 units/kg/hr 6 hours after rate change   0.1-0.19 46-55.9 25 units/kg Increase by 2 units/kg/hr    0.2-0.29 56-65.9 - Increase by 1 unit/kg/hr    0.3-0.5 66-87 Continue current rate 6 hours;  If therapeutic x2, recheck qAM   0.51-0.6 87.1-98 - Decrease by 1 unit/kg/hr 6 hours after rate change   0.61-0.8 98.1-118 - Decrease by 2 units/kg/hr    0.81-1 118.1-140 Hold for 1 hour Decrease by 3 units/kg/hr    >1 >140 Hold infusion and repeat STAT anti-Xa/aPTT in 1 hour.  If anti-Xa <= 1 or aPTT <= 140, restart infusion and decrease by 4 units/kg/hr. Repeat anti-Xa/aPTT 6 hours after rate change.  If anti-Xa > 1 or aPTT >140, continue to hold and repeat anti-Xa/aPTT in 1 hour. Notify provider.   *For re-bolus dosing in ACS (STEMI, NSTEMI, UA), do not exceed maximum of 4,000 units.    Indication: Afib/Aflutter  Goal: aPTT 66-87 (last Eliquis ~6/9 PTA)  Dosing weight: 88 kg     68 y.o. 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 OSH admission 6/1 with irregular heart beats, transferred to [facility] for H[facility]. Started on Eliquis ~08/2023 - tachy-brady syndrome, SSS, some Afib noted on OSH note.     6/10: Heparin start aPTT 66-87 for AFib    Plan:  Andrew [name] is a 68 y.o. male who has been referred to pharmacy for therapeutic heparin management.     CBC stable. Scr 0.73   Per OSH record, on Lovenox, eventual plan to resume Eliquis - so unclear when was the last use of Lovenox vs Eliquis. Will check baseline labs.   --> baseline anti-XA 1.04, aPTT 34.6. Confirming recent Eliquis use. Will use aPTT     Bolus: No bolus at this time  Infusion: Initiate at 1000 units/hr    Next aPTT ordered at 1600     Pharmacy will continue to monitor the patient?s clinical progress and communicate any changes with treatment team. For questions about this patient's heparin therapy, please call [phone] [facility]) or [phone] [facility]).    Andrew [name], PharmD, 09/10/2023, 9:31 AM     Addendum:  Last  aPTT  at 1621 was 74.1, which is therapeutic.    Plan:   Heparin rate: Continue at 1000 units/hr  Next aPTT ordered at 2030.     Pharmacy will continue to monitor the patient?s clinical progress and communicate any changes with treatment team. For questions about this patient's heparin therapy, please call [phone] [facility]) or [phone] [facility]).    Andrew [name], PharmD 09/10/2023 6:11 PM     Addendum:  Last  aPTT  at 1955 was 83.9, which is therapeutic.    Plan:   Heparin rate: Continue at 1000 units/hr  Next aPTT ordered at 0400 on 6/11.     Pharmacy will continue to monitor the patient?s clinical progress and communicate any changes with treatment team. For questions about this patient's heparin therapy, please call [phone] [facility]) or [phone] [facility]).    Andrew [name], PharmD 09/10/2023 8:34 PM     Addendum:  Last  aPTT  at 0158 was >180, which is critically elevated.     Plan:   aPTT was drawn during a rapid response event (tachyarrhythmia). Given acuity of event, lab was not drawn appropriately per discussion with RN. Repeat aPTT ordered STAT. Recommend no changes to heparin infusion pending repeat lab unless concerns for bleeding arise or level delayed in resulting    Pharmacy will continue to monitor the patient?s clinical progress and communicate any changes with treatment team. For questions about this patient's heparin therapy, please call [phone] [facility]) or [phone] [facility]).    Andrew [name], PharmD 09/11/2023 3:22 AM

## 2023-09-10 NOTE — Other
 [name] Clinical Goal:   Clinical Goal(s) for the Shift: complete admissions, VSS, stable cardiac rhythm, no c/p of cp/sob/n/v/pain, replace lytes prn, maintain comfort and safety  Identify possible barriers to advancing the care plan: None  Stability of the patient: Moderately Unstable - medium risk of patient condition declining or worsening    Progression of [name] Clinical Goal: A&Ox4, NSR/SB w BBB, HR 40-70s, SBP 130-150s, RA, no c/p of cp/sob/n/v/pain, admission completed, no lytes replaced, maintained comfort and safety     Plan: Pacemaker placement, NPO starting 6/10 @ 0100

## 2023-09-10 NOTE — Consults
 IP CM ACTIVE DISCHARGE PLANNING  Department of Care Coordination      Admit [phone]  Anticipated Date of Discharge: 09/12/2023    Following [id number], Melida Sprain., MD      Today's short update     h/o stroke, tachy-brady syndrom, pending EP for PPM. Pending PT eval.    Disposition     Home w/Home Health  Other (Comment), [facility]  [address]  [place] [address]  Family/Support System in agreement with the current discharge plan: Yes, in agreement and participating    Multidisciplinary Team Member Plan of Care   Interdisciplinary rounds were conducted with the multidisciplinary team including the clinical social worker and nurse case manager. The patient's plan of care and discharge plan were discussed and formulated based on the patient's specific needs.    Home Health Coordination Status (if applicable)     Order pending (1/7)       Other Arrangements (if applicable)        CM Handoff Report    Patient transferring to: [facility]    Past Medical History:   Diagnosis Date    CVA (cerebral vascular accident) (HCC/RAF)     H/O aortic valve replacement     Kidney stone     Urinary frequency         Past Surgical History:   Procedure Laterality Date    AORTIC VALVE REPLACEMENT              Needs assessment Completed                             Utilization Review (including payor communication) Current:                     Yes                          Admit status Appropriate                              CM Handoff Please f/u with PT eval   Disposition Home, possible HH, pls f/u   Home Health Home Health/Hospice Orders            No orders found for display            DME No orders of the defined types were placed in this encounter.                      II [employer])  MSN RN    Department of Care Coordination and Clinical Social Work   Medicine Case Production designer, theatre/television/film  Primary Team: CCU/COU GOLD  T: [phone]  C: [phone]  Pager: [phone]  [place]: [phone]  Email  [email or web address]          After Hours Case Management Contacts:    Swing Shift (M-F): Pager [phone]    Weekend: Pager [phone]    ED Case Manager: Pager [phone]

## 2023-09-11 ENCOUNTER — Ambulatory Visit: Payer: Commercial Managed Care - HMO

## 2023-09-11 ENCOUNTER — Ambulatory Visit: Payer: PRIVATE HEALTH INSURANCE

## 2023-09-11 ENCOUNTER — Ambulatory Visit: Payer: Commercial Managed Care - Pharmacy Benefit Manager

## 2023-09-11 DIAGNOSIS — R001 Bradycardia, unspecified: Secondary | ICD-10-CM

## 2023-09-11 DIAGNOSIS — R55 Syncope and collapse: Secondary | ICD-10-CM

## 2023-09-11 LAB — APTT
APTT: 74.6 s — ABNORMAL HIGH (ref 24.4–36.2)
APTT: 83.9 s — ABNORMAL HIGH (ref 24.4–36.2)
APTT: 112.9 s — ABNORMAL HIGH (ref 24.4–36.2)
APTT: >180 s — CR (ref 24.4–36.2)
APTT: 79 s — ABNORMAL HIGH (ref 24.4–36.2)

## 2023-09-11 LAB — Basic Metabolic Panel
ANION GAP: 11 mmol/L (ref 8–19)
CHLORIDE: 104 mmol/L (ref 96–106)
POTASSIUM: 4.3 mmol/L (ref 3.6–5.3)
SODIUM: 141 mmol/L (ref 135–146)

## 2023-09-11 LAB — Magnesium
MAGNESIUM: 1.9 meq/L (ref 1.4–1.9)
MAGNESIUM: 2.1 meq/L — ABNORMAL HIGH (ref 1.4–1.9)

## 2023-09-11 LAB — HS Troponin I (Reflexed): HIGH SENSITIVITY TROPONIN I: 22 ng/L — ABNORMAL HIGH (ref <5)

## 2023-09-11 LAB — Extra Light Green Top

## 2023-09-11 LAB — B-Type Natriuretic Peptide: BNP: 26 pg/mL (ref <100)

## 2023-09-11 LAB — CBC: MEAN CORPUSCULAR VOLUME: 85.8 fL (ref 79.3–98.6)

## 2023-09-11 LAB — MRSA Surveillance

## 2023-09-11 LAB — Differential Automated: ABSOLUTE LYMPHOCYTE COUNT: 2.73 10*3/uL (ref 1.30–3.40)

## 2023-09-11 LAB — HS Troponin I + (3hr repeat): HIGH SENSITIVITY TROPONIN I: 17 ng/L — ABNORMAL HIGH (ref <5)

## 2023-09-11 MED ADMIN — AMIODARONE HCL 360 MG/200 ML DRIP RTU: 1 mg/min | INTRAVENOUS | @ 11:00:00 | Stop: 2023-09-11

## 2023-09-11 MED ADMIN — AMIODARONE HCL 150 MG/100 ML DRIP RTU: 150 mg | INTRAVENOUS | @ 09:00:00 | Stop: 2023-09-11 | NDC 43066015010

## 2023-09-11 MED ADMIN — AMIODARONE HCL 360 MG/200 ML DRIP RTU: 1 mg/min | INTRAVENOUS | @ 01:00:00 | Stop: 2023-09-11

## 2023-09-11 MED ADMIN — AMIODARONE HCL 360 MG/200 ML DRIP RTU: .5 mg/min | INTRAVENOUS | @ 17:00:00 | Stop: 2023-09-11

## 2023-09-11 MED ADMIN — ASCORBIC ACID 500 MG PO TABS: 500 mg | ORAL | @ 15:00:00 | Stop: 2023-09-22 | NDC 00904052361

## 2023-09-11 MED ADMIN — CHOLECALCIFEROL 25 MCG (1000 UT) PO TABS: 25 ug | ORAL | @ 15:00:00 | Stop: 2023-09-22

## 2023-09-11 MED ADMIN — ATORVASTATIN CALCIUM 20 MG PO TABS: 20 mg | ORAL | @ 04:00:00 | Stop: 2023-09-22 | NDC 68084009811

## 2023-09-11 MED ADMIN — AMIODARONE HCL 360 MG/200 ML DRIP RTU: .5 mg/min | INTRAVENOUS | @ 14:00:00 | Stop: 2023-09-11 | NDC 43066036020

## 2023-09-11 MED ADMIN — HEPARIN (PORCINE) IN NACL 25000-0.45 UT/250ML-% IV SOLN: 910 [IU]/h | INTRAVENOUS | @ 18:00:00 | Stop: 2023-09-13 | NDC 00409765005

## 2023-09-11 MED ADMIN — HEPARIN (PORCINE) IN NACL 25000-0.45 UT/250ML-% IV SOLN: 910 [IU]/h | INTRAVENOUS | @ 13:00:00 | Stop: 2023-09-13

## 2023-09-11 MED ADMIN — LACTATED RINGERS IV BOLUS: 1000 mL | INTRAVENOUS | @ 09:00:00 | Stop: 2023-09-11 | NDC 00338011704

## 2023-09-11 MED ADMIN — MAGNESIUM SULFATE 4 GM/100ML IV SOLN: 4 g | INTRAVENOUS | @ 09:00:00 | Stop: 2023-09-11 | NDC 00338171540

## 2023-09-11 MED ADMIN — ASPIRIN 81 MG PO CHEW: 81 mg | ORAL | @ 15:00:00 | Stop: 2023-09-17 | NDC 66553000201

## 2023-09-11 MED ADMIN — AMIODARONE HCL 200 MG PO TABS: 200 mg | ORAL | @ 17:00:00 | Stop: 2023-09-19 | NDC 60687043711

## 2023-09-11 MED ADMIN — LACTATED RINGERS IV BOLUS: 500 mL | INTRAVENOUS | @ 15:00:00 | Stop: 2023-09-11 | NDC 00338011704

## 2023-09-11 MED ADMIN — AMIODARONE HCL 360 MG/200 ML DRIP RTU: 1 mg/min | INTRAVENOUS | @ 09:00:00 | Stop: 2023-09-11 | NDC 43066036020

## 2023-09-11 MED ADMIN — ALLOPURINOL 100 MG PO TABS: 200 mg | ORAL | @ 15:00:00 | Stop: 2023-09-22 | NDC 60687067711

## 2023-09-11 NOTE — Consults
 Inpatient Cardiac Electrophysiology Consultation    DATE OF SERVICE: 09/11/2023  PATIENT: Andrew [name]   MRN: [id number]  DOB: [birth year]    REFERRING PHYSICIAN: Larissa Plowman, MD    REASON FOR CONSULTATION: History of syncope, rapid atrial fibrillation, and sinus bradycardia    89M admitted for further management of rapid atrial fibrillation     MEDICAL PROBLEMS  Paroxysmal atrial fibrillation (CHADSVASc score = 4 due to age, stroke, and hypertension)  Anticoagulation: apixaban 5mg  twice daily  Rate control: diltiazem and previously metoprolol  History of bicuspid aortic valve stenosis  Status post surgical, bioprosthetic aortic valve replacement January 2013 [facility] Ease #25; Dr. Gorge Laud; [facility])  Postoperative stroke, with residual left-sided and right lower extremity weakness    HISTORY OF PRESENT ILLNESS:    Index diagnosis of arrhythmia was that of paroxysmal atrial fibrillation following elective surgical bioprosthetic aortic valve replacement at [facility] in 2013; he did not have recurrence following this isolated episode and thus was not managed with antiarrhythmic nor anticoagulation regimen.     He presented to the hospital twice more in May 2025, once for treatment of dyspnea and suspected heart failure, and again for palpitation, where he was diagnosed with recurrence of atrial fibrillation and rapid ventricular rates; this was managed with diltiazem and apixaban. As he re-presented for palpitation and dizziness on Aug 31, 2023 to [facility] he was treated with IV diltiazem and found to have subsequent pauses up to 5 seconds' duration. He does not recall symptoms, but rather than he was given a diagnosis of tachy-brady syndrome and recommended to undergo pacemaker placement.     Initiated on amiodarone 09/10/2023 for treatment of atrial arrhythmia; subsequently had recurrence of SVT (likely AT) with symptomatic conversion pause.     PAST MEDICAL HISTORY:  Past Medical History:   Diagnosis Date    CVA (cerebral vascular accident) (HCC/RAF)     H/O aortic valve replacement     Kidney stone     Urinary frequency        PAST SURGICAL HISTORY:  Past Surgical History:   Procedure Laterality Date    AORTIC VALVE REPLACEMENT         FAMILY HISTORY:  Family History   Problem Relation Age of Onset    No Known Problems Mother     Other (gastric ulcer) Father     Uterine cancer Sister     Stroke Brother     Kidney failure Brother     Colon cancer Neg Hx     Lung cancer Neg Hx     Prostate cancer Neg Hx        SOCIAL HISTORY:  Social History     Socioeconomic History    Marital status: Married   Tobacco Use    Smoking status: Former     Current packs/day: 0.00     Average packs/day: 1 pack/day for 15.0 years (15.0 ttl pk-yrs)     Types: Cigarettes     Start date: 05/03/1975     Quit date: 05/02/1990     Years since quitting: 33.3    Smokeless tobacco: Never   Substance and Sexual Activity    Alcohol use: Not Currently     Comment: occasionally has red wine    Drug use: Not Currently     Social Drivers of Health     Financial Resource Strain: Low Risk  (09/10/2023)    Financial Resource Strain     Difficulty of  Paying Living Expenses: Not very hard       OUTPATIENT MEDICATIONS:  Current Facility-Administered Medications   Medication Dose Route Frequency    allopurinol tab 200 mg  200 mg Oral Daily    [COMPLETED] amiodarone 150 mg in dextrose 100 mL IVPB RTU 150 mg  150 mg Intravenous Once    amiodarone tab 200 mg  200 mg Oral BID    ascorbic acid tab 500 mg  500 mg Oral Daily with breakfast    aspirin chew tab 81 mg  81 mg Oral Daily    atorvastatin tab 20 mg  20 mg Oral Nightly    heparin 25,000 units in 0.45% NaCl 250 mL drip RTU  820 Units/hr Intravenous Continuous    heparin per pharmacy   Does not apply Continuous    [COMPLETED] lactated ringers IV soln bolus 1,000 mL  1,000 mL Intravenous Once    [COMPLETED] lactated ringers IV soln bolus 500 mL  500 mL Intravenous Once [COMPLETED] magnesium sulfate 2 g in water for injection 50 mL RTU  2 g Intravenous Once    [COMPLETED] magnesium sulfate 4 g in water for injection 100 mL RTU  4 g Intravenous Once    polyethylene glycol pwd pkt 17 g  17 g Oral Daily PRN    [COMPLETED] potassium chloride ER tab 20 mEq  20 mEq Oral Once    senna tab 1 tablet  1 tablet Oral QHS PRN    [COMPLETED] sodium chloride 0.9% IV soln        vitamin D (cholecalciferol) tab 25 mcg  25 mcg Oral Daily    [DISCONTINUED] amiodarone 360 mg in dextrose 200 mL drip RTU (peripheral)  1 mg/min Intravenous Continuous    [DISCONTINUED] amiodarone 360 mg in dextrose 200 mL drip RTU (peripheral)  1 mg/min Intravenous Continuous    [DISCONTINUED] amiodarone 360 mg in dextrose 200 mL drip RTU (peripheral)  0.5 mg/min Intravenous Continuous    [DISCONTINUED] amiodarone 360 mg in dextrose 200 mL drip RTU (peripheral)  0.5 mg/min Intravenous Continuous    [DISCONTINUED] apixaban tab 5 mg  5 mg Oral BID    [DISCONTINUED] atorvastatin tab 20 mg  20 mg Oral Nightly    [DISCONTINUED] metoprolol tartrate tab 12.5 mg  12.5 mg Oral BID    [DISCONTINUED] Patient Transfer (Notification to Pharmacy)-REQUIRED   Does not apply Pharmacy Communication    [DISCONTINUED] polyethylene glycol pwd pkt 17 g  17 g Oral Daily        ALLERGIES:  No Known Allergies    INPATIENT SCHEDULED MEDICATIONS:   allopurinol  200 mg Oral Daily    amiodarone  200 mg Oral BID    ascorbic acid  500 mg Oral Daily with breakfast    aspirin  81 mg Oral Daily    atorvastatin  20 mg Oral Nightly    cholecalciferol  25 mcg Oral Daily         PHYSICAL EXAMINATION:  VITALS: BP 134/105  ~ Pulse 65  ~ Temp 36.3 ?C (97.3 ?F) (Oral)  ~ Resp 17  ~ Wt 195 lb 5.2 oz (88.6 kg)  ~ SpO2 99%  ~ BMI 31.53 kg/m?    Temp:  [36.2 ?C (97.2 ?F)-36.7 ?C (98 ?F)] 36.3 ?C (97.3 ?F)  Heart Rate:  [39-192] 65  Resp:  [17-19] 17  BP: (79-185)/(55-138) 134/105  NBP Mean:  [65-144] 114  SpO2:  [89 %-100 %] 99 %        Intake/Output Summary (  Last 24 hours) at 09/11/2023 1512  Last data filed at 09/11/2023 1155  Gross per 24 hour   Intake 2307.32 ml   Output 2050 ml   Net 257.32 ml       General: well developed well nourished in no acute distress  Eyes: extraocular movements intact, anicteric.  ENT: oropharynx clear, moist mucous membranes  Neck: JVP not assessed. No lymphadenopathy  CV: Regular rhythm by peripheral exam  Resp: Good air entry throughout all fields bilaterally, no wheezes or crackles.  GI: abd soft, non tender, non distended, normoactive bowel sounds  MSK: extremities without clubbing or cyanosis.  No significant edema  Derm: warm, dry , and well perfused  Neuro: grossly non focal      LABORATORY:  Lab Results   Component Value Date    WBC 7.10 09/11/2023    HGB 14.5 09/11/2023    HCT 44.2 09/11/2023    MCV 85.8 09/11/2023    PLT 191 09/11/2023    Lab Results   Component Value Date    K 4.3 09/11/2023    MG 1.9 09/11/2023    CREAT 0.79 09/11/2023    Lab Results   Component Value Date    ALT 109 (H) 09/10/2023    AST 35 09/10/2023    ALKPHOS 75 09/10/2023    BILITOT 0.5 09/10/2023      Lab Results   Component Value Date    INR 1.0 09/10/2023    APTT 79.0 (H) 09/11/2023    Lab Results   Component Value Date    TSH 4.1 09/10/2023    Lab Results   Component Value Date    HGBA1C 6.4 (H) 05/25/2023     Lab Results   Component Value Date    TROPONIN 22 (H) 09/11/2023         DIAGNOSTIC STUDIES:    Telemetry 09/11/2023: intermittent runs of rapid SVT, probable atrial tachycardia (long RP interval) with conversion pause, associated with symptoms:          12-lead ECG 09/09/2023: sinus rhythm at 70 bpm with PR interval and QRS duration 130-115ms:         Prior ECGs dating back to 2022 demonstrated sinus rhythm at 74 bpm with PR interval and QRS duration in a right bundloid pattern:        TTE 08/08/2023: LV EF 60-65% with normal LV size and moderate concentric LVH.       Outside records and tracings from [facility] May 2025:     08/23/2023 ECG demonstrating sinus with premature atrial complexes:      08/23/2023: sinus rhythm with initiation of narrow complex tachycardia, irregular, most likely atrial fibrillation:       Telemetry report 08/23/2023 demonstrating SVT with pauses, likely conversion:          ASSESSMENT:    89M admitted for further management of rapid atrial fibrillation     MEDICAL PROBLEMS    Paroxysmal atrial fibrillation (CHADSVASc score = 4 due to age, stroke, and hypertension)  Anticoagulation: apixaban 5mg  twice daily  Rate control: diltiazem and previously metoprolol  History of bicuspid aortic valve stenosis  Status post surgical, bioprosthetic aortic valve replacement January 2013 [facility] Ease #25; Dr. Gorge Laud; [facility])  Postoperative stroke, with residual left-sided and right lower extremity weakness    DISCUSSION:    Cardiac Electrophysiology was consulted for management of paroxysmal atrial fibrillation with rapid ventricular rates, and reported  pauses noted on telemetry.    Review of telemetry data from [facility] appears to corroborate presence of sinus pauses, either due to sinus node dysfunction or in the context of conversion from bouts of atrial tachycardia.     Due to occurrence of symptomatic pauses during treatment of atrial arrhythmia with amiodarone (09/11/2023), recommended pacemaker implantation - discussed the benefits, risks, and indications associated with this with patient and wife, who elected to proceed. All questions were answered and informed consent completed 09/11/2023.     RECOMMENDATIONS:    Maintain on telemetry while inpatient.   Continue amiodarone, 200mg  twice daily for two weeks, followed by 200mg  daily thereafter.   Maintain bilateral, functional upper extremity peripheral IV catheters.   Will plan for add-on inpatient pacemaker implantation, pending coordination of Cardiac Anesthesia availability.     Kemper Patterson, MD  Clinical Cardiac Electrophysiology Fellow          Attending Addendum:  Agree with assessment and plan as documented above which I helped formulate.      Judith Novak, MD  Cardiac Electrophysiology Attending

## 2023-09-11 NOTE — Consults
 BRIEF CCU TRIAGE DOCUMENTATION     PATIENT: Andrew [name]  MRN: [id number]  DOB: 1955-05-25  DATE OF EVALUATION: 09/11/2023     REFERRING PRACTITIONER: Zhao, Evan, DO  PRIMARY CARE PROVIDER: Maggie Schooner, MD    CHIEF CONCERN: No chief complaint on file.         Cardiac Critical Care Unit was called by Direct Care Hospitalist at 2:20AM 09/11/2023 to evaluate Toy Eisemann    Subjective/Reason for ICU Evaluation:      [id number]  with PMHx of bicuspid aortic valve s/p bioprosthetic SAVR in 04/2011, with perioperative CVA with residual left leg paresis, and gout admitted from OSH transfer for pacemaker placement evaluation iso pre-syncope episodes x2. Downgraded to [facility] from CCU this AM.    Patient noted around 2am to be having episodes of narrow complex tachycardia with HR up to 190s lasting for 30 seconds at a time. Also symptomatic with lightheadedness/dizziness during episodes. Otherwise rest of vitals WNL and no other symptoms. Overnight hospitalist consulted EP who recommended starting amio bolus + gtt. This prompted improvement in runs, but did also cause brief 1-2 second sinus pauses on telemetry without changes in other vitals.    CCU was called to evaluate given this intermittent tachy-brady with to assess whether closer monitoring at ICU level is indicated.     Objective:       BP (!) 149/138  ~ Pulse (!) 125  ~ Temp 36.6 ?C (97.8 ?F) (Temporal)  ~ Resp 18  ~ Wt 87.8 kg (193 lb 9 oz)  ~ SpO2 98%  ~ BMI 31.24 kg/m?   Focused exam:   Gen: AOx3, no acute distress  CV: generally in sinus with HR 60-100s, very brief 3-5 second episodes of HR to 190s. Normal s1/s2 w/o m/g/r.  Pulm: CTAB, normal WOB  Ext: warm, well perfused, w/o edema    Assessment:   Patient is s/p amio bolus initiation and was monitored at bedside by CCU team and [facility] overnight hospitalist. It was noted that after the bolus, patient had significant improvement in HR and by 2:50PM was not noted to have the earlier noted tachyarrhythmia for 10+ minutes. He also did not have bradycardia or pauses.    Overall, his presentation is consistent w/ a tachy-brady syndrome. EP is already consulted and planning for possible PPM. He has already been started on medical treatment in the interim with amiodarone.    Given he is hemodynamically stable without hypotension and symptoms of lightheaded are fleeting only when HR 190s, which has also improved, he was not deemed as having an indication for ICU level monitoring.     Recommendations:   - Work-up  - Obtain BMP, Mg  - Treatment  - Continue amiodarone gtt  - Replete lytes for goal K/Mg 4.5/2.5  - Appreciate EP consideration of PPM    Disposition: Recommend continue care on current inpatient team    Based on the recommended disposition, I discussed this case with Dr. Leveda Rea on Medicine team who has agreed to continue care at current level of service    Case will be discussed with attending Dr. Josely Moffat.     Author:  Mardeen Shackleton, MD  [facility] Internal Medicine Resident PGY-2  09/11/2023

## 2023-09-11 NOTE — Nursing Note
 Pt experiencing tachy brady syndrome, HR 40-200s, Lui MD paged and @ bedside accompanied by CCU team. Amio bolus given and Amio gtt started (see MAR), labs collected, 4g mag IV replaced, 1L LR bolus, heparin continued @ 1000 units/hr, 2 additional PIV started, 12 lead EKG taken, pt placed on 2L NC, pt placed on pacer pads connected to zoll per MD nursing communication, post debrief w hospitalist and CCU will continue to remain on 7N and will continue to monitor

## 2023-09-11 NOTE — Progress Notes
 09/11/23 1007   Time Calculation   Start Time 1007   Patient not seen due to Medically not appropriate;Team/RN requesting to hold treatment     Per RN, hold PT until s/p pacemaker tomorrow. Patient has been having tachy brady episodes and goal is to limit exertion. Please re-order PT s/p pacemaker placement.

## 2023-09-11 NOTE — Progress Notes
 Pharmaceutical Services - Heparin Consult Management Note    Objective Data   Allergies: Patient has no known allergies.  Height:   Most recent documented height   08/09/23 1.676 m (5' 6'')     Actual Weight:   Most recent documented weight   09/11/23 88.6 kg       Heparin drip administrations (last 48 hours)       Date/Time Action Medication Dose Rate    09/10/23 1034 New Bag/ Syringe/ Cartridge    heparin 25,000 units in 0.45% NaCl 250 mL drip RTU 1,000 Units/hr 10 mL/hr            Anticoagulant/Antiplatelet administrations (last 48 hours)       None            Current Labs  Recent Labs     09/10/23  0430 09/10/23  1006 09/10/23  1631 09/11/23  0158 09/11/23  0411   APTT  --  34.6   < > >180.0* 112.9*   HEPANTIXAUFH  --  1.04*  --   --   --    INR  --  1.0  --   --   --    BILITOT 0.5  --   --   --   --    HGB 13.8  --   --  14.5  --    PLT 164  --   --  191  --     < > = values in this interval not displayed.     No results found for: ''HEPPLTAB''    Assessment:  Protocol: Low Intensity: Anti-Xa goal 0.3-0.5 or aPTT goal 66-87  Anti-Xa aPTT Re-bolus if Ordered  (NTE 5,000 units*) /  Hold Infusion Rate Change Check Anti-Xa/aPTT   <0.1 <46 25 units/kg Increase by 3 units/kg/hr 6 hours after rate change   0.1-0.19 46-55.9 25 units/kg Increase by 2 units/kg/hr    0.2-0.29 56-65.9 - Increase by 1 unit/kg/hr    0.3-0.5 66-87 Continue current rate 6 hours;  If therapeutic x2, recheck qAM   0.51-0.6 87.1-98 - Decrease by 1 unit/kg/hr 6 hours after rate change   0.61-0.8 98.1-118 - Decrease by 2 units/kg/hr    0.81-1 118.1-140 Hold for 1 hour Decrease by 3 units/kg/hr    >1 >140 Hold infusion and repeat STAT anti-Xa/aPTT in 1 hour.  If anti-Xa <= 1 or aPTT <= 140, restart infusion and decrease by 4 units/kg/hr. Repeat anti-Xa/aPTT 6 hours after rate change.  If anti-Xa > 1 or aPTT >140, continue to hold and repeat anti-Xa/aPTT in 1 hour. Notify provider.   *For re-bolus dosing in ACS (STEMI, NSTEMI, UA), do not exceed maximum of 4,000 units.     Indication: Afib/Aflutter  Goal: aPTT 66-87 (last Eliquis ~6/9 PTA)  Dosing weight: 88 kg      68 y.o. 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 OSH admission 6/1 with irregular heart beats, transferred to [facility] for HLOC. Started on Eliquis ~08/2023 - tachy-brady syndrome, SSS, some Afib noted on OSH note.      6/10: Heparin start aPTT 66-87 for Afib  6/11: Around 2-2:30 am, patient had tachyarrhythmia, lightheadedness, consistent w/ tachy-brady syndrome. ICU monitoring was not deemed necessary given stable hemodynamics. EP planning for PPM.     Plan:  Andrew [name] is a  68 y.o. male who has been referred to pharmacy for therapeutic heparin management.     Last  aPTT  at 0158 was >180 - there was c/o contamination per d/w RN.  Repeat aPTT at 0411 was 112.9 - better but still high, will decrease     Around 2-2:30 am, patient had tachyarrhythmia, lightheadedness, consistent w/ tachy-brady syndrome. ICU monitoring was not deemed necessary given stable hemodynamics. EP planning for PPM.     CBC today stable.     Infusion: Decrease to 820 units/hr    Next aPTT ordered at 1200     Pharmacy will continue to monitor the patient?s clinical progress and communicate any changes with treatment team. For questions about this patient's heparin therapy, please call [phone] [facility]) or [phone] [facility]).    Andrew [name], PharmD, 09/11/2023, 5:10 AM     Addendum:  Last  aPTT  at 1158 was 79.0, which is therapeutic.    Plan:   Heparin rate: Continue at 820 units/hr  Next aPTT ordered at 1800.     Pharmacy will continue to monitor the patient?s clinical progress and communicate any changes with treatment team. For questions about this patient's heparin therapy, please call [phone] [facility]) or [phone] [facility]).    Andrew [name], PharmD 09/11/2023 12:56 PM     Addendum:  Last  anti-Xa  at 1802 was 58.7, which is subtherapeutic.    Plan:   Heparin rate: Increase to 910 units/hr  Next aPTT ordered at 0400 on 6/12.     Pharmacy will continue to monitor the patient?s clinical progress and communicate any changes with treatment team. For questions about this patient's heparin therapy, please call [phone] [facility]) or [phone] [facility]).    Andrew [name], PharmD 09/11/2023 7:59 PM

## 2023-09-11 NOTE — Consults
 IP CM ACTIVE DISCHARGE PLANNING  Department of Care Coordination      Admit ZOXW:[id number]  Anticipated Date of Discharge: 09/13/2023    Following UJ:WJXB, Wenda Hallmark, MD      Today's short update       09/11/23 1656   Rapid IDR   Attendance - Document Daily Provider;Case manager;Social worker   Reason for ongoing hospitalization Clinical instability;Ongoing medical management   Today's progression of care pending pacemaker placement   Anticipated Consult CARDS   Expected Disposition Home   Post-Acute Recommendations Pending plan         Disposition     (P) Home  (P) Pending plan  [address]  [facility] [place] [address]  Family/Support System in agreement with the current discharge plan: Yes, in agreement and participating        Anthon Baston, BSN, RN, CMGT-BC  Department of Care Coordination and Clinical Social Work  Medicine Case Manager : Primary Team: 1  T: [phone] / C: [phone] / Pager: [phone] / Email : [email or web address]  For Weekend/Swing shift Case Management assistance contact ([email or web address])  For assistance after hours:  -PM Case Manager: Ethlyn Herd RN-CM work cell: [phone])  -PM Case Manager: Camilla Cedar RN-CM work cell: [phone])  -ER Case Manager: [phone])

## 2023-09-11 NOTE — Progress Notes
 Hospitalist Progress Note  [facility]    Patient Name Owais Pruett   Patient MRN [id number]   Patient PCP Maggie Schooner, MD   Date of Service 09/11/2023   Hospital Day 2   Admission Date 09/09/2023       Interval Events / Subjective / Review of Systems  Overnight, multiple episodes of symptomatic narrow complex tachycardia. Started on amiodarone bolus/gtt with some improvement, but then subsequently developed episodes of prolonged sinus pauses. Variably on and off amiodarone gtt overnight. This morning, review of telemetry showed alternating episodes of narrow complex tachycardia and sinus bradycardia with intermittent pauses of variable length. One episode of sustained tachycardia was associated with hypotension and associated symptoms, though this improved once patient spontaneously exited his tachyarrhythmia. Given 500 cc bolus and HRs eventually stabilized with further amiodarone.     He reports feeling better now after prior episodes. No light headedness, chest pain, SOB, or other symptoms. Wife at bedside and we reviewed the plan of care.     Medications   Scheduled:  allopurinol, 200 mg, Oral, Daily  amiodarone, 200 mg, Oral, BID  ascorbic acid, 500 mg, Oral, Daily with breakfast  aspirin, 81 mg, Oral, Daily  atorvastatin, 20 mg, Oral, Nightly  cholecalciferol, 25 mcg, Oral, Daily   Continuous:   heparin 25,000 units/250 mL drip 820 Units/hr (09/11/23 1116)      PRN:  polyethylene glycol, senna     Vital Signs Ins and Outs   Temp:  [36.2 ?C (97.2 ?F)-36.7 ?C (98 ?F)] 36.3 ?C (97.3 ?F)  Heart Rate:  [39-192] 66  Resp:  [17-19] 17  BP: (79-185)/(55-138) 127/105  NBP Mean:  [65-144] 114  SpO2:  [89 %-100 %] 95 % I/O last 2 completed shifts:  In: 1589.3 [P.O.:360; I.V.:129.3; IV Piggyback:1100]  Out: 2775 [Urine:2775]     Physical Examination  Gen: in NAD, comfortable, cooperative, conversant  Lungs: normal work of breathing, clear to auscultation bilaterally  Heart: regular rate and rhythm, no murmurs  Abd: soft, nondistended, nontender to palpation  Ext: no lower extremity edema bilaterally  Neuro: alert, oriented, face symmetric, no dysarthria    Labs -   Reviewed. Pertinent findings below:    Recent Labs     09/11/23  0158 09/10/23  0430   WBC 7.10 4.68   HGB 14.5 13.8   HCT 44.2 41.3   MCV 85.8 84.6   PLT 191 164     Recent Labs     09/11/23  0158 09/10/23  0430   NA 141 141   K 4.3 3.8   CL 104 105   CO2 25 26   BUN 13 14   CREAT 0.79 0.73   MG 1.9 1.7   CALCIUM 8.8 9.1     Recent Labs     09/10/23  0430   ALT 109*   AST 35   BILITOT 0.5   ALKPHOS 75   ALBUMIN 3.9     Imaging/Other Studies -   Reviewed. Pertinent findings below:    Micro -   Reviewed. Pertinent findings below:  Recent Results (from the past week)   MRSA Surveillance, Nares    Collection Time: 09/09/23 11:45 PM    Specimen: Nares; Respiratory, Upper   Result Value Ref Range    MRSA Surveillance       No Methicillin-resistant Staphylococcus aureus isolated.     Assessment and Plan  Domenic Schoenberger is a 68 y.o. male with history of pAfib , tachy-brady syndrome,  and other medical issues as below, who is now admitted for management of tachy-brady syndrome.     #Tachy brady syndrome  #pAfib  #Presyncope  Unstable HR that was difficult to control ON, though now with improvement. Currently HDS and in NSR.   - EP consulted. Discussed case and will stop amiodarone gtt and start amiodarone PO 200 mg BID    - If further episodes of narrow complex tachycardia, can trial amiodarone bolus vs IV metoprolol    - Plan for PPM placement, likely tomorrow. NPO at midnight   - Cont heparin gtt with regular monitoring of anti-Xa levels   - Hold apixaban, diltiazem, and carvedilol   - Cont tele monitoring     Chronic/Stable/POA:  #Hx of bicuspid AV s/p bioprosthetic SAVR  #Hx of CVA c/p LE weakness: home ASA, atorva  #HLD: home atorvastatin  #Gout by Hx  #Hx of HTN: hold home amlodipine, home losartan 25mg  PO BID held  #HX of medial malleolus fracture  #Hx of osteoporosis: home calcium-vitamin D, f/u PCP for repeat DEXA, not on bisphosphonate    Inpatient Checklist  Orders Placed This Encounter      Diet regular      Diet NPO Reason for NPO: Procedure; Except for: Medications, Sips of water, Ice Chips    DVT ppx: on heparin gtt  Lines/drains/catheters: none    Code Status: Full Code  Contact:  Primary Emergency Contact: Mc, Bloodworth, Home Phone: [phone]    Disposition  Home     This patient has a high probability of sudden, clinically significant deterioration, which requires the highest level of physician preparedness to intervene urgently. Critical care services were provided for the issues discussed in my note, and in particular management of acute circulatory failure.    Total critical care time was 40 minutes, including the examination of patient, development of treatment plan, re-evaluations, ordering, performing treatment, interventions, obtaining history from patient, family, review of old records, ordering/reviewing medications, discussion of treatment plan with consultant, patient, family. Time exclusive of procedures.    Larissa Plowman, MD  Attending Physician  [facility]  09/11/2023 1:38 PM

## 2023-09-11 NOTE — Procedures
 PATIENT:  Andrew [name]  MRN:  [id number]  DOB:  January 14, 1956  DATE OF PROCEDURE:   09/11/2023   TIME OF PROCEDURE:     2:40 AM      Procedure:  Ultrasound Guided Peripheral IV    Procedure:     INDICATIONS:  Venous Insufficiency and > 3 failed attempts at venous access     PROCEDURE PHYSICIAN: Alison [place], MD  ASSISTING PHYSICIAN:    None  ATTENDING PHYSICIAN:   Mitchel An., MD  Resident present? No    A time-out was performed, verifying the patient, site and procedure. The patient was positioned appropriately. The target vein was identified: right upper cephalic. The arm was prepared in a sterile fashion.     Ultrasound guidance was used to document vessel patency and real time visualization of needle entry. The vein was accessed after 1 needle passes. Dark, non-pulsatile, venous blood was visualized. A 20 gauge catheter measuring 4-5 cm was advanced into the vessel.  Saline flushed smoothly through the IV. The area was cleaned and a sterile dressing was applied.    Estimated blood loss of 3 mL.    Post procedure:      COMPLICATION(S):  none apparent    CONDITION: Patient tolerated the procedure well    Catheter Care: The catheter may be left in place as long as there is no sign or thrombophlebitis or infiltration. Please change the dressing once a week, in a a sterile fashion as if changing a PICC line dressing. You may change the dressing sooner if it becomes wet, soiled, or starts peeling off.     Alison [place], MD   09/11/2023 at 2:40 AM    Billing  IV CPT [phone]  Ultrasound CPT [phone]

## 2023-09-11 NOTE — Other
 [name] Clinical Goal:   Clinical Goal(s) for the Shift: VSS, stable cardiac rhythm, no c/p of cp/sob/n/v/pain, replace lytes prn, maintain comfort and safety  Identify possible barriers to advancing the care plan: None  Stability of the patient: Moderately Unstable - medium risk of patient condition declining or worsening    Progression of [name] Clinical Goal: A&Ox4, pt was NSR/SB w BBB and at 0130 pt went into tachy brady syndrome w HR from 40-200 (see nursing clinical note), pt also experienced freq pauses, longest 4.4 seconds, MD aware and involved in care planning, pt placed on NPO for potential pacemaker placement, EP consulted, amio gtt @ 0.5 mg/min, during episodes of SVT pt feels lightheadedness and cp, MD aware, pt place on 2L NC, no c/p of n/v/pain, lytes replaced (see MAR), heparin gtt @ 820 units/hr, no s/sx bleeding, maintained comfort and safety     Pt currently attached to pacer pads and zoll    Plan: EP consulted for pacemaker placement

## 2023-09-11 NOTE — Other
 [name] Clinical Goal:   Clinical Goal(s) for the Shift: VSS, stable cardiac rhythm, no CP/SOB/NV/dizziness, replete lytes prn, maintain safety and comfort  Identify possible barriers to advancing the care plan: None  Stability of the patient: Moderately Unstable - medium risk of patient condition declining or worsening    Progression of [name] Clinical Goal: A&Ox4, VSS, NSR on monitor, HR  60s, SBP  130s, spO2>94% on RA to 2L NC for comfort.     ~0730-8AM, multiple episodes of tachy-brady arrhythmia with HR from 50s-170s, hypotensive with SBP in 80s, and dizziness. MD notified and given 500 mL LR bolus with improvement of BP to 130s/90s-100s.  Amiodarone gtt transitioned to PO, no further episodes of SVT/SB or sinus pauses. Denies CP/SOB/NV/dizziness. Pacer pads on patient and Zoll at bedside. Mg replaced with 2g IVPB. Heparin gtt infusing at 820 units/hr. Safety and comfort maintained.          Plan:   Possible PM placement tomorrow

## 2023-09-11 NOTE — Progress Notes
 09/11/23 1034   Time Calculation   Start Time 1030   Patient not seen due to Medically not appropriate  (Received order for OT. Per RN pt is having tachy brady episodes. Requesting to hold until after pacemaker placement tomorrow,)   Chart accessed for Treatment scheduling or assignment

## 2023-09-12 LAB — APTT
APTT: 58.7 s — ABNORMAL HIGH (ref 24.4–36.2)
APTT: 69 s — ABNORMAL HIGH (ref 24.4–36.2)
APTT: 81.3 s — ABNORMAL HIGH (ref 24.4–36.2)

## 2023-09-12 LAB — Magnesium: MAGNESIUM: 2 meq/L — ABNORMAL HIGH (ref 1.4–1.9)

## 2023-09-12 LAB — CBC: HEMOGLOBIN: 14.4 g/dL (ref 13.5–17.1)

## 2023-09-12 LAB — Basic Metabolic Panel
CHLORIDE: 106 mmol/L (ref 96–106)
CHLORIDE: 106 mmol/L (ref 96–106)

## 2023-09-12 LAB — Differential Automated: ABSOLUTE IMMATURE GRAN COUNT: 0.02 10*3/uL (ref 0.00–0.04)

## 2023-09-12 MED ADMIN — ALLOPURINOL 100 MG PO TABS: 200 mg | ORAL | @ 15:00:00 | Stop: 2023-09-22 | NDC 60687067711

## 2023-09-12 MED ADMIN — ATORVASTATIN CALCIUM 20 MG PO TABS: 20 mg | ORAL | @ 04:00:00 | Stop: 2023-09-22 | NDC 68084009811

## 2023-09-12 MED ADMIN — POTASSIUM CHLORIDE ER 10 MEQ PO TBCR: 20 meq | ORAL | Stop: 2023-09-12 | NDC 00245531689

## 2023-09-12 MED ADMIN — AMLODIPINE BESYLATE 5 MG PO TABS: 5 mg | ORAL | @ 20:00:00 | Stop: 2023-09-17 | NDC 00904637061

## 2023-09-12 MED ADMIN — AMIODARONE HCL 200 MG PO TABS: 200 mg | ORAL | @ 15:00:00 | Stop: 2023-09-19 | NDC 60687043711

## 2023-09-12 MED ADMIN — ASPIRIN 81 MG PO CHEW: 81 mg | ORAL | @ 15:00:00 | Stop: 2023-09-17 | NDC 66553000201

## 2023-09-12 MED ADMIN — ASCORBIC ACID 500 MG PO TABS: 500 mg | ORAL | @ 15:00:00 | Stop: 2023-09-22 | NDC 00904052361

## 2023-09-12 MED ADMIN — HEPARIN (PORCINE) IN NACL 25000-0.45 UT/250ML-% IV SOLN: 910 [IU]/h | INTRAVENOUS | @ 02:00:00 | Stop: 2023-09-13

## 2023-09-12 MED ADMIN — AMIODARONE HCL 150 MG/100 ML DRIP RTU: 150 mg | INTRAVENOUS | @ 22:00:00 | Stop: 2023-09-12 | NDC 43066015010

## 2023-09-12 MED ADMIN — HEPARIN (PORCINE) IN NACL 25000-0.45 UT/250ML-% IV SOLN: 910 [IU]/h | INTRAVENOUS | @ 22:00:00 | Stop: 2023-09-13 | NDC 00409765005

## 2023-09-12 MED ADMIN — HEPARIN (PORCINE) IN NACL 25000-0.45 UT/250ML-% IV SOLN: 910 [IU]/h | INTRAVENOUS | @ 03:00:00 | Stop: 2023-09-13

## 2023-09-12 MED ADMIN — CHOLECALCIFEROL 25 MCG (1000 UT) PO TABS: 25 ug | ORAL | @ 15:00:00 | Stop: 2023-09-22

## 2023-09-12 MED ADMIN — AMIODARONE HCL 200 MG PO TABS: 200 mg | ORAL | @ 04:00:00 | Stop: 2023-09-19 | NDC 60687043711

## 2023-09-12 MED ADMIN — MAGNESIUM SULFATE 2 GM/50ML IV SOLN: 2 g | INTRAVENOUS | Stop: 2023-09-12 | NDC 70121171901

## 2023-09-12 NOTE — Consults
 Inpatient Cardiac Electrophysiology Consultation    DATE OF SERVICE: 09/12/2023  PATIENT: Andrew [name]   MRN: [id number]  DOB: 08-30-1955    REFERRING PHYSICIAN: Trent Dull*    REASON FOR CONSULTATION: History of syncope, rapid atrial fibrillation, and sinus bradycardia    40M admitted for further management of rapid atrial fibrillation     MEDICAL PROBLEMS  Paroxysmal atrial fibrillation (CHADSVASc score = 4 due to age, stroke, and hypertension)  Anticoagulation: apixaban 5mg  twice daily  Rate control: diltiazem and previously metoprolol  History of bicuspid aortic valve stenosis  Status post surgical, bioprosthetic aortic valve replacement January 2013 [facility] Ease #25; Dr. Talitha; [facility])  Postoperative stroke, with residual left-sided and right lower extremity weakness    HISTORY OF PRESENT ILLNESS:    Index diagnosis of arrhythmia was that of paroxysmal atrial fibrillation following elective surgical bioprosthetic aortic valve replacement at [facility] in 2013; he did not have recurrence following this isolated episode and thus was not managed with antiarrhythmic nor anticoagulation regimen.     He presented to the hospital twice more in May 2025, once for treatment of dyspnea and suspected heart failure, and again for palpitation, where he was diagnosed with recurrence of atrial fibrillation and rapid ventricular rates; this was managed with diltiazem and apixaban. As he re-presented for palpitation and dizziness on Aug 31, 2023 to [facility]) he was treated with IV diltiazem and found to have subsequent pauses up to 5 seconds' duration. He does not recall symptoms, but rather than he was given a diagnosis of tachy-brady syndrome and recommended to undergo pacemaker placement.     Initiated on amiodarone 09/10/2023 for treatment of atrial arrhythmia; subsequently had recurrence of SVT (likely AT) with symptomatic conversion pause.     PAST MEDICAL HISTORY:  Past Medical History:   Diagnosis Date    CVA (cerebral vascular accident) (HCC/RAF)     H/O aortic valve replacement     Kidney stone     Urinary frequency        PAST SURGICAL HISTORY:  Past Surgical History:   Procedure Laterality Date    AORTIC VALVE REPLACEMENT         FAMILY HISTORY:  Family History   Problem Relation Age of Onset    No Known Problems Mother     Other (gastric ulcer) Father     Uterine cancer Sister     Stroke Brother     Kidney failure Brother     Colon cancer Neg Hx     Lung cancer Neg Hx     Prostate cancer Neg Hx        SOCIAL HISTORY:  Social History     Socioeconomic History    Marital status: Married   Tobacco Use    Smoking status: Former     Current packs/day: 0.00     Average packs/day: 1 pack/day for 15.0 years (15.0 ttl pk-yrs)     Types: Cigarettes     Start date: 05/03/1975     Quit date: 05/02/1990     Years since quitting: 33.3    Smokeless tobacco: Never   Substance and Sexual Activity    Alcohol use: Not Currently     Comment: occasionally has red wine    Drug use: Not Currently     Social Drivers of Health     Financial Resource Strain: Low Risk  (09/10/2023)    Financial Resource Strain     Difficulty of Paying  Living Expenses: Not very hard       OUTPATIENT MEDICATIONS:  Current Facility-Administered Medications   Medication Dose Route Frequency    allopurinol tab 200 mg  200 mg Oral Daily    [COMPLETED] amiodarone 150 mg in dextrose 100 mL IVPB RTU 150 mg  150 mg Intravenous Once    amiodarone tab 200 mg  200 mg Oral BID    ascorbic acid tab 500 mg  500 mg Oral Daily with breakfast    aspirin chew tab 81 mg  81 mg Oral Daily    atorvastatin tab 20 mg  20 mg Oral Nightly    heparin 25,000 units in 0.45% NaCl 250 mL drip RTU  910 Units/hr Intravenous Continuous    heparin per pharmacy   Does not apply Continuous    [COMPLETED] lactated ringers IV soln bolus 1,000 mL  1,000 mL Intravenous Once    [COMPLETED] lactated ringers IV soln bolus 500 mL  500 mL Intravenous Once    [COMPLETED] magnesium sulfate 2 g in water for injection 50 mL RTU  2 g Intravenous Once    [COMPLETED] magnesium sulfate 2 g in water for injection 50 mL RTU  2 g Intravenous Once    [COMPLETED] magnesium sulfate 4 g in water for injection 100 mL RTU  4 g Intravenous Once    polyethylene glycol pwd pkt 17 g  17 g Oral Daily PRN    [COMPLETED] potassium chloride CR tab 20 mEq  20 mEq Oral Once    [COMPLETED] potassium chloride ER tab 20 mEq  20 mEq Oral Once    senna tab 1 tablet  1 tablet Oral QHS PRN    [COMPLETED] sodium chloride 0.9% IV soln        vitamin D (cholecalciferol) tab 25 mcg  25 mcg Oral Daily    [DISCONTINUED] amiodarone 360 mg in dextrose 200 mL drip RTU (peripheral)  1 mg/min Intravenous Continuous    [DISCONTINUED] amiodarone 360 mg in dextrose 200 mL drip RTU (peripheral)  1 mg/min Intravenous Continuous    [DISCONTINUED] amiodarone 360 mg in dextrose 200 mL drip RTU (peripheral)  0.5 mg/min Intravenous Continuous    [DISCONTINUED] amiodarone 360 mg in dextrose 200 mL drip RTU (peripheral)  0.5 mg/min Intravenous Continuous    [DISCONTINUED] apixaban tab 5 mg  5 mg Oral BID    [DISCONTINUED] atorvastatin tab 20 mg  20 mg Oral Nightly    [DISCONTINUED] metoprolol tartrate tab 12.5 mg  12.5 mg Oral BID    [DISCONTINUED] Patient Transfer (Notification to Pharmacy)-REQUIRED   Does not apply Pharmacy Communication    [DISCONTINUED] polyethylene glycol pwd pkt 17 g  17 g Oral Daily        ALLERGIES:  No Known Allergies    INPATIENT SCHEDULED MEDICATIONS:   allopurinol  200 mg Oral Daily    amiodarone  200 mg Oral BID    ascorbic acid  500 mg Oral Daily with breakfast    aspirin  81 mg Oral Daily    atorvastatin  20 mg Oral Nightly    cholecalciferol  25 mcg Oral Daily         PHYSICAL EXAMINATION:  VITALS: BP 144/102  ~ Pulse 75  ~ Temp 36.6 ?C (97.9 ?F) (Oral)  ~ Resp 20  ~ Wt 196 lb 3.4 oz (89 kg)  ~ SpO2 95%  ~ BMI 31.67 kg/m?    Temp:  [36.1 ?C (97 ?F)-36.8 ?C (98.3 ?F)] 36.6 ?C (97.9 ?F)  Heart Rate:  [55-75] 75  Resp:  [17-20] 20  BP: (118-149)/(87-106) 144/102  NBP Mean:  [98-117] 115  SpO2:  [93 %-99 %] 95 %        Intake/Output Summary (Last 24 hours) at 09/12/2023 9095  Last data filed at 09/12/2023 9490  Gross per 24 hour   Intake 1052.53 ml   Output 2025 ml   Net -972.47 ml       General: well developed well nourished in no acute distress  Eyes: extraocular movements intact, anicteric.  ENT: oropharynx clear, moist mucous membranes  Neck: JVP not assessed. No lymphadenopathy  CV: Regular rhythm by peripheral exam  Resp: Good air entry throughout all fields bilaterally, no wheezes or crackles.  GI: abd soft, non tender, non distended, normoactive bowel sounds  MSK: extremities without clubbing or cyanosis.  No significant edema  Derm: warm, dry , and well perfused  Neuro: grossly non focal      LABORATORY:  Lab Results   Component Value Date    WBC 6.09 09/12/2023    HGB 14.4 09/12/2023    HCT 43.3 09/12/2023    MCV 84.9 09/12/2023    PLT 168 09/12/2023    Lab Results   Component Value Date    K 4.5 09/12/2023    MG 2.0 (H) 09/12/2023    CREAT 0.71 09/12/2023    Lab Results   Component Value Date    ALT 109 (H) 09/10/2023    AST 35 09/10/2023    ALKPHOS 75 09/10/2023    BILITOT 0.5 09/10/2023      Lab Results   Component Value Date    INR 1.0 09/10/2023    APTT 69.0 (H) 09/12/2023    Lab Results   Component Value Date    TSH 4.1 09/10/2023    Lab Results   Component Value Date    HGBA1C 6.4 (H) 05/25/2023     Lab Results   Component Value Date    TROPONIN 22 (H) 09/11/2023         DIAGNOSTIC STUDIES:    Telemetry 09/11/2023: intermittent runs of rapid SVT, probable atrial tachycardia (long RP interval) with conversion pause, associated with symptoms:          12-lead ECG 09/09/2023: sinus rhythm at 70 bpm with PR interval and QRS duration 130-12ms:         Prior ECGs dating back to 2022 demonstrated sinus rhythm at 74 bpm with PR interval and QRS duration in a right bundloid pattern:        TTE 08/08/2023: LV EF 60-65% with normal LV size and moderate concentric LVH.       Outside records and tracings from [facility] May 2025:     08/23/2023 ECG demonstrating sinus with premature atrial complexes:      08/23/2023: sinus rhythm with initiation of narrow complex tachycardia, irregular, most likely atrial fibrillation:       Telemetry report 08/23/2023 demonstrating SVT with pauses, likely conversion:          ASSESSMENT:    75M admitted for further management of rapid atrial fibrillation     MEDICAL PROBLEMS    Paroxysmal atrial fibrillation (CHADSVASc score = 4 due to age, stroke, and hypertension)  Anticoagulation: apixaban 5mg  twice daily  Rate control: diltiazem and previously metoprolol  History of bicuspid aortic valve stenosis  Status post surgical, bioprosthetic aortic valve replacement January 2013 [facility] Ease #25; Dr.  Talitha; [facility])  Postoperative stroke, with residual left-sided and right lower extremity weakness    DISCUSSION:    Cardiac Electrophysiology was consulted for management of paroxysmal atrial fibrillation with rapid ventricular rates, and reported pauses noted on telemetry.    Review of telemetry data from [facility] appears to corroborate presence of sinus pauses, either due to sinus node dysfunction or in the context of conversion from bouts of atrial tachycardia.     Due to occurrence of symptomatic pauses during treatment of atrial arrhythmia with amiodarone (09/11/2023), recommended pacemaker implantation - discussed the benefits, risks, and indications associated with this with patient and wife, who elected to proceed. All questions were answered and informed consent completed 09/11/2023.     RECOMMENDATIONS:    Maintain on telemetry while inpatient.   Continue amiodarone, 200mg  twice daily for two weeks, followed by 200mg  daily thereafter.   Maintain bilateral, functional upper extremity peripheral IV catheters.   Will plan for add-on inpatient pacemaker implantation, pending coordination of Cardiac Anesthesia availability.     Bebe Farr, MD  Clinical Cardiac Electrophysiology Fellow        Attending Addendum: Agree with assessment and plan as documented above which I helped formulate.      Raguel Has, MD  Cardiac Electrophysiology Attending

## 2023-09-12 NOTE — Nursing Note
 Pt. Presenting with tachy brady syndrome with HR going from 90's- 180's frequently lasting short few seconds with multiple episodes, Dr. Crista Domino was paged and at bedside to assess patient, patient is stable and asymptomatic, VSS, CCU Fellow at bedside as well to assess patient and team was changed to CCU. Amio bolus was ordered and administered over 1hr per CCU Fellow verbal communication due to patient having history of sinus pauses while receiving amiodarone drip. Patient is tolerating the medication at this time. ICU level of care ordered and awaiting bed availability. B/P- 149/82.     @1615 : Patient is now in NSR with HR-70's s/p Amiodarone bolus given, ICU orders discontinued at this time. Continued to monitor patient.

## 2023-09-12 NOTE — Other
 [name] Clinical Goal:   Clinical Goal(s) for the Shift: VS stable, cardiac rhythm stable, no c/o CP or SOB, no arrythmias/pauses, NPO after midnight, maintain safety and comfort  Identify possible barriers to advancing the care plan: none  Stability of the patient: Moderately Unstable - medium risk of patient condition declining or worsening    Progression of [name] Clinical Goal: Patient A&Ox4. Patient denies pain or SOB. VS stable BP 145/105  ~ Pulse 69  ~ Resp 19  ~ SpO2 94%  on room air. NSR on monitor. No arrhythmias. Patient in no distress. Heparin Drip infusing at 910 units/hr. No s/sx of active bleeding noted. NPO since midnight. Zoll at bedside, pacer pads on. Safety and comfort maintained    Plan:   Possible PCM placement today (add on case)

## 2023-09-12 NOTE — Consults
 SPIRITUAL CARE CONSULTATION NOTE    PATIENT:  Andrew [name]  MRN:  [id number]     Patient Info        Religious/Spiritual Identity:        Catholic       Last Anointed Date:                 Baptised:                 Spiritual Care Visit Details              Date of Visit:  09/12/23  Time of Visit:  1652  Visited with Family (Specify), Patient (Spouse/wife)   Visit length 5 Minutes   Referral source Self-initiated   Reason for visit Initial visit/assessment, Other (Comment) (Celebrate pt's birthday)      Spiritual Assessment     Spiritual practices & resources Family/Friends   Areas of spiritual/emotional distress Unable to assess (Specify reason) (Pt. unable to interact during this visit.)   Distressful feelings N/A   Indicators of spiritual wellbeing N/A, Expresses...   Expressions of spiritual wellbeing Not applicable on this visit      Plan     Spiritual care intervention Active Listening, Blessing offered, Building trust, Introduction to chaplain services, Ministry of presence, Offered words of comfort/encouragement, Music/singing/chanting   Outcomes (per patient/family) Appreciated visit   Spiritual care plans Continue to visit as needed   Additional comments Above interventions pertain to interactions with pt's spouse at bedside.      Recommendation           Author:  Joanne Salah Mugambi Jena Tegeler 09/12/2023 5:12 PM  Contact info: RR pager: [phone] ext: [phone]

## 2023-09-12 NOTE — Other
 [name] Clinical Goal:   Clinical Goal(s) for the Shift: safety comfort  Identify possible barriers to advancing the care plan:   Stability of the patient: Moderately Unstable - medium risk of patient condition declining or worsening    Progression of [name] Clinical Goal: pt remained alert and oriented sinus 60 sbp still elevated given amlodipine as ordered by attending medicine denies cp nor sob at 1442 HR went up to 180 as reported by the NOR this primary RN was on break when incident happened pts.HR is very labile and converted to AF then svt then sinus EKG done team changed to CCU amio bolus given by the NOR planned to be transferred to CCU pending bed availability at 1615 pt converted to sinus 70s post amiodarone bolus icu orders was discontinued will continue to monitor patient for arrythmia and plan for pacemaker placement tom .heparin drip at 90 units/hour no s/s of bleeding noted

## 2023-09-12 NOTE — Significant Event
 67 year old M who remains hospitalized for management of rapid atrial fibrillation with reported pauses on telemetry was feeling well until 2-3 PM today when he developed rapid heart rate associated with dizziness.     I was called to assess patient at bedside due to recurrent tachyarrhythmia with the highest HR of 189. HR improving to 90 with Valsalva maneuvers but quickly raising back up. BP stable at all times with SBP 140-150.     CCU team was consulted.     Prior vital signs:     Last Recorded Vital Signs:    09/12/23 1143   BP: 139/104   Pulse: 65   Resp: 18   Temp: 36.4 ?C (97.5 ?F)   SpO2: 95%        Outcome:   Ordered amiodarone 150 mg bolus to be run over 1 hour  Transfer to CCU for higher level of care     Critical Care Billing  40 minutes was spent caring for this critically ill patient, including time spent at the patients bedside, reviewing old records, treatment of their vital organ failure, interpretation of diagnostic studies, coordination of care with consultants, and prevention of further life threatening deterioration in the patient's condition.

## 2023-09-12 NOTE — Progress Notes
 Hospitalist Progress Note  [facility]    Patient Name Andrew [name]   Patient MRN [id number]   Patient PCP Andrew Schooner, MD   Date of Service 09/12/2023   Hospital Day 3   Admission Date 09/09/2023       Interval Events / Subjective / Review of Systems    Patient is feeling well today, quite hungry but otherwise reports no CP, palpitations, SOB.   He was pending PPM placement but will not happen until tomorrow.     Medications   Scheduled:  allopurinol, 200 mg, Oral, Daily  amiodarone, 200 mg, Oral, BID  ascorbic acid, 500 mg, Oral, Daily with breakfast  aspirin, 81 mg, Oral, Daily  atorvastatin, 20 mg, Oral, Nightly  cholecalciferol, 25 mcg, Oral, Daily   Continuous:   heparin 25,000 units/250 mL drip 910 Units/hr (09/12/23 1143)      PRN:  polyethylene glycol, senna     Vital Signs Ins and Outs   Temp:  [36.1 ?C (97 ?F)-36.8 ?C (98.3 ?F)] 36.4 ?C (97.5 ?F)  Heart Rate:  [65-75] 65  Resp:  [17-20] 18  BP: (128-150)/(87-105) 139/104  NBP Mean:  [99-117] 115  SpO2:  [93 %-99 %] 95 % I/O last 2 completed shifts:  In: 1552.5 [P.O.:840; I.V.:162.5; IV Piggyback:550]  Out: 2025 [Urine:2025]     Physical Examination  Gen: in NAD, comfortable, cooperative, conversant  Lungs: normal work of breathing, clear to auscultation bilaterally  Heart: regular rate and rhythm, no murmurs  Abd: soft, nondistended, nontender to palpation  Ext: no lower extremity edema bilaterally  Neuro: alert, oriented, face symmetric, no dysarthria    Labs -   Reviewed. Pertinent findings below:    Recent Labs     09/12/23  0440 09/11/23  0158 09/10/23  0430   WBC 6.09 7.10 4.68   HGB 14.4 14.5 13.8   HCT 43.3 44.2 41.3   MCV 84.9 85.8 84.6   PLT 168 191 164     Recent Labs     09/12/23  0440 09/11/23  1457 09/11/23  0158   NA 140 141 141   K 4.5 4.2 4.3   CL 106 104 104   CO2 24 26 25    BUN 13 11 13    CREAT 0.71 0.66 0.79   MG 2.0* 2.1* 1.9   CALCIUM 8.5* 8.5* 8.8     Recent Labs     09/10/23  0430   ALT 109*   AST 35   BILITOT 0.5 ALKPHOS 75   ALBUMIN 3.9     Imaging/Other Studies -   Reviewed. Pertinent findings below:    Micro -   Reviewed. Pertinent findings below:  Recent Results (from the past week)   MRSA Surveillance, Nares    Collection Time: 09/09/23 11:45 PM    Specimen: Nares; Respiratory, Upper   Result Value Ref Range    MRSA Surveillance       No Methicillin-resistant Staphylococcus aureus isolated.     Assessment and Plan  Andrew [name] is a 68 y.o. male with history of pAfib , tachy-brady syndrome, and other medical issues as below, who is now admitted for management of tachy-brady syndrome.     #Tachy brady syndrome  #pAfib  #Presyncope  Unstable HR that was difficult to control ON, though now with improvement. Currently HDS and in NSR.   - EP consulted. Discussed case and will stop amiodarone gtt and start amiodarone PO 200 mg BID    - If further  episodes of narrow complex tachycardia, can trial amiodarone bolus vs IV metoprolol    - Plan for PPM placement, likely tomorrow. NPO at midnight   - Cont heparin gtt with regular monitoring of anti-Xa levels   - Hold apixaban, diltiazem, and carvedilol   - Cont tele monitoring     #HTN  - restart home amlodipine  - home losartan 25mg  PO BID held    Chronic/Stable/POA:  #Hx of bicuspid AV s/p bioprosthetic SAVR  #Hx of CVA c/p LE weakness: home ASA, atorva  #HLD: home atorvastatin  #Gout by Hx    #HX of medial malleolus fracture  #Hx of osteoporosis: home calcium-vitamin D, f/u PCP for repeat DEXA, not on bisphosphonate    Inpatient Checklist  Orders Placed This Encounter      Diet regular      Diet NPO Reason for NPO: Procedure    DVT ppx: on heparin gtt  Lines/drains/catheters: none    Code Status: Full Code  Contact:  Primary Emergency Contact: Andrew [name], Home Phone: [phone]    Disposition  Home     Billing information:     55 minutes were spent personally by me today on this encounter which include today's pre-visit review of the chart, obtaining appropriate history, performing an evaluation, documentation and discussion of management with details supported within the note for today's visit. The time documented was exclusive of any time spent on the separately billed procedure.     In addition to E&M services as provided above, I spent an additional 35 mins of prolonged non face-face time on the day prior to encounter (ie 6/11), as well as earlier this AM, in performing in depth chart review of the patient's pertinent prior PMHx/objective clinical data, significant hospitalization events to date, and in review of the prior MD's email signout to me, as I was anticipating on taking over primary hospitalist attending duties today. This was medically necessary to adequately manage the patient's complex comorbidities as described above      Earmon Glow  [facility] Internal Medicine Hospitalist  [id number]  09/12/2023 1:13 PM

## 2023-09-12 NOTE — H&P
 CCU History and Physical      Patient: Andrew [name]  MRN: [id number]  Date of Service: 09/12/2023  Primary Care Provider: Jeana Francisco, MD  Attending Physician: Nivek Powley, MD    Chief Complaint   No chief complaint on file.      History of Present Illness   Andrew [name] is a 68 y.o. male 68 y.o. w/ h/o bicuspid AV sp TAVR 2013 (bioprosthetic) cb CVA w residual LLE [name], Andrew to OSH w pre-syncope transferred for reported tachy-brady pending PPM placement.     Per  Admission HPI:  On 07/30/2023 Andrew to ED with chest pain x 2 weeks, worsens at night, SOB, weakness, elevated BP at home. Patient also endorsed an episode of dizziness, palpitations, and pre-syncope.  ED workup was unrevealing with troponin flat at 15 x2, BNP normal, and EKG stable. Was discharged with cardiology follow-up.     On 5/19 patient began to experience spontaneous light-headedness/dizziness, sweating, shortness of breath. He also had low blood pressure ( 95/72). He was brought to [facility] where they did chest x-ray and saw fluid build-up in lungs. During this hospitalization, he was kept on monitor and was found to have episodes of tachycardia and bradycardia that corresponded with his dizziness symptoms. Blood pressures were labile within these episodes. Patient was started on apixaban and amiodarone. Since then, he has since stopped amiodarone and been trialed on diltiazem which was also dc'd. Patient currently on metoprolol succ 12.5mg  BID.     On 6/1, patient had another similar episode of dizziness and palpitations. Pts wife called 911 and he was taken to [facility] due irregular heart beats. During this hospitalization, pt was diagnosed with tachy-brady SSS with multiple sinus pauses, longest 5 seconds and bifascicular block. MD at [facility] recommended pacemaker placement but lack capabilities, recommended transfer to [facility].    Hospital Course:   Started on amiodarone and MTP for treatment of atrial arrhythmia while pending pacemaker evaluation. Noted to go into a cycle of SVT into symptomatic sinus pauses. Due to unstable arrhythmia, patient was upgrade to CCU for closer monitoring.         14-point ROS negative except as outlined above.    Emergency Department Course     ED Triage Vitals [09/09/23 2312]   Temp Temp Source BP Heart Rate Resp SpO2 O2 Device Pain Score Weight   36.3 ?C (97.3 ?F) Oral 142/104 63 18 93 % None (Room air) Zero 87.8 kg (193 lb 9 oz)       Past Medical History     Past Medical History:   Diagnosis Date    CVA (cerebral vascular accident) (HCC/RAF)     H/O aortic valve replacement     Kidney stone     Urinary frequency        Past Surgical History     Past Surgical History:   Procedure Laterality Date    AORTIC VALVE REPLACEMENT          Allergies   No Known Allergies    Home Medications     Medications that the patient states to be currently taking   Medication Sig    ALLOPURINOL 100 mg tablet TAKE 2 TABLETS BY MOUTH EVERY DAY    AMLODIPINE 5 mg tablet TAKE 1 TABLET (5 MG TOTAL) BY MOUTH DAILY.    apixaban 5 mg tablet Take 1 tablet (5 mg total) by mouth two (2) times daily.    ascorbic acid 500 mg  tablet Take 1 tablet (500 mg total) by mouth daily.    aspirin  81 mg EC tablet Take 1 tablet (81 mg total) by mouth daily.    ATORVASTATIN  20 mg tablet TAKE 1 TABLET BY MOUTH EVERYDAY AT BEDTIME    CALCIUM  PO Take 600 mg by mouth two (2) times daily.    carvedilol  6.25 mg tablet Take 1 tablet (6.25 mg total) by mouth two (2) times daily.    dilTIAZem (TIAZAC) 120 mg 24 hr capsule Take 1 capsule (120 mg total) by mouth daily.    losartan  25 mg tablet Take 1 tablet (25 mg total) by mouth two (2) times daily.    Multiple Vitamins-Minerals (HCA SUPER THERAVITE-M PO) Take 1 tablet by mouth daily.    pantoprazole  20 mg DR tablet Take 1 tablet (20 mg total) by mouth daily. (Patient taking differently: Take 1 tablet (20 mg total) by mouth daily as needed (heartburn).) vitamin D, cholecalciferol , 25 mcg (1000 units) tablet Take 1 tablet (25 mcg total) by mouth daily.        Social History     Social History     Socioeconomic History    Marital status: Married   Tobacco Use    Smoking status: Former     Current packs/day: 0.00     Average packs/day: 1 pack/day for 15.0 years (15.0 ttl pk-yrs)     Types: Cigarettes     Start date: 05/03/1975     Quit date: 05/02/1990     Years since quitting: 33.3    Smokeless tobacco: Never   Substance and Sexual Activity    Alcohol use: Not Currently     Comment: occasionally has red wine    Drug use: Not Currently     Social Drivers of Health     Financial Resource Strain: Low Risk  (09/10/2023)    Financial Resource Strain     Difficulty of Paying Living Expenses: Not very hard       Family History     Family History   Problem Relation Age of Onset    No Known Problems Mother     Other (gastric ulcer) Father     Uterine cancer Sister     Stroke Brother     Kidney failure Brother     Colon cancer Neg Hx     Lung cancer Neg Hx     Prostate cancer Neg Hx        Physical Exam   Temp:  [36.1 ?C (97 ?F)-36.8 ?C (98.3 ?F)] 36.4 ?C (97.5 ?F)  Heart Rate:  [65-181] 78  Resp:  [17-20] 18  BP: (128-163)/(82-121) 159/86  NBP Mean:  [99-133] 107  SpO2:  [91 %-96 %] 96 %   UOP: Urine:  [0 mL-450 mL] 300 mL I/Os:   Intake/Output Summary (Last 24 hours) at 09/12/2023 1559  Last data filed at 09/12/2023 1300  Gross per 24 hour   Intake 622.8 ml   Output 1950 ml   Net -1327.2 ml       Gen: Well-developed male in no acute distress  Eyes: PERRL, EOMI  ENT: OP clear, MMM  Neck: Supple, trachea midline, no cervical or supraclavicular lymphadenopathy  CV: Intermittently tachy then bradycardic   Pulm: CTAB, no wheezes/rales/rhonchi, normal work of breathing  Abd: NABS, soft, flat, NT, no masses or organomegaly  Skin: Warm and dry, no rashes  Ext: No cyanosis or edema. Distal pulses 2+ and symmetric  Neuro: A&Ox4, CNII-XII intact, 4/5 strength in  LLE, 5/5 strength in all other ext, sensation grossly intact    Laboratory Data   CBC:  Recent Labs     09/12/23  0440 09/11/23  0158 09/10/23  0430   WBC 6.09 7.10 4.68   NEUTABS 3.31 3.46 2.13   HGB 14.4 14.5 13.8   HCT 43.3 44.2 41.3   MCV 84.9 85.8 84.6   PLT 168 191 164     BMP:  Recent Labs     09/12/23  0440 09/11/23  1457 09/11/23  0158   NA 140 141 141   K 4.5 4.2 4.3   CL 106 104 104   CO2 24 26 25    BUN 13 11 13    CREAT 0.71 0.66 0.79   GLUCOSE 100* 124* 91   CALCIUM 8.5* 8.5* 8.8   MG 2.0* 2.1* 1.9     LFT:  Recent Labs     09/10/23  0430   TOTPRO 7.1   ALBUMIN 3.9   BILITOT 0.5   BILICON 0.2   ALT 109*   AST 35   ALKPHOS 75     COAGS:  Recent Labs     09/12/23  1119 09/12/23  0440 09/11/23  1802 09/10/23  1631 09/10/23  1006   INR  --   --   --   --  1.0   PT  --   --   --   --  13.3   APTT 81.3* 69.0* 58.7*   < > 34.6    < > = values in this interval not displayed.     UA:  No results for input(s): ''SPECGRAVUR'', ''PHUR'', ''BLDUR'', ''KETONESUR'', ''GLUCOSEUR'', ''PROTCLUR'', ''NITRITEUR'', ''LEUKESTUR'', ''RBCSUR'', ''WBCSUR'' in the last 72 hours.    Microbiology     Recent Results (from the past week)   MRSA Surveillance, Nares    Collection Time: 09/09/23 11:45 PM    Specimen: Nares; Respiratory, Upper   Result Value Ref Range    MRSA Surveillance       No Methicillin-resistant Staphylococcus aureus isolated.        Imaging   No imaging has been resulted in the last 24 hours     Assessment/Plan   Andrew [name] is a 68 y.o. male / h/o bicuspid AV sp TAVR 2013 (bioprosthetic) cb CVA w residual LLE [name], pAFib Andrew to OSH w pre-syncope transferred for reported tachy-brady pending PPM placement.     #TachyGLENWOOD Cleaves Arrhythmia  #pAfib  EKG and cardiac monitoring shows episodes of SVT then followed by sinus bradycardia with sinus pauses   - metop 12.5 BID,   - amiodarone   - heparin gtt     #Hx bicupsid AV sp TAVR:   - ASA     #Hx periop CVA  Noted to have recovered LLE strength but continues to feel off balance   - ASA  - Atorvastatin  - PT/OT after PPM    #HTN:  - Amlodipine 5mg      #HLD:   - Atorvastatin    #Gout:   - Allopurinol     #Osteoporosis   #Vit D- Deficiency   - Continue on Vit Supplements     #HX of medial malleolus fracture   -CAM boot as tolerated     #Hx of bicuspid AV s/p bioprosthetic SAVR       Inpatient Checklist:  Orders Placed This Encounter      Diet regular      Diet NPO Reason for NPO: Procedure  DVT Prophylaxis: Heparin gtt  GI Prophylaxis: not indicated  Central Lines: none  Tubes/Drains: none    #Disposition:    Code Status: Full Code  Contact:  Primary Emergency Contact: Eulan, Heyward, Home Phone: [phone]    Patient has been seen and discussed with attending, Dr. Allegra.    Author:  Elsie Chihuahua, MD  PGY-3, Internal Medicine  [phone]  09/12/2023 3:59 PM    I have personally interviewed and examined this patient with Dr. Chihuahua on the date of service and I agree with the above assessment and plan which we formulated together. I personally reviewed all the available diagnostic studies. The results, findings, medications, and plan of care were all reviewed and communicated with the patient and/or family.     Lyberti Thrush, MD  Advanced Heart Failure and Transplantation Cardiology  Pager [phone]

## 2023-09-12 NOTE — Progress Notes
 09/12/23 1144   Time Calculation   Start Time 1140   Last OT Treatment Date   (Attempted 6/12)   Patient not seen due to Medically not appropriate  (Per RN pt not medically stable to mobilize at this time. Pacemeker placement not scheduled yet. Pt NPO. Will d/c OT order at this time and await new order after placement.)   Chart accessed for Treatment scheduling or assignment

## 2023-09-12 NOTE — Consults
 IP CM ACTIVE DISCHARGE PLANNING  Department of Care Coordination      Admit ZOXW:[id number]  Anticipated Date of Discharge: 09/14/2023    Following UJ:[id number], Darko, MD      Today's short update        09/12/23 1348   MD/NP   Today's progression of care admitted for tachy / brady syndrome, plan for pacemaker placement under caridio anesthesia   Case Management   Expected Disposition Home   Discharge Address [address],  [place] [address]         Disposition     Home  Pending plan  (P) [address]  [place] [address]  Family/Support System in agreement with the current discharge plan: Yes, in agreement and participating    Freedom of Choice        Multidisciplinary Team Member Plan of Care   Interdisciplinary rounds were conducted with the multidisciplinary team including the clinical social worker and nurse case manager. The patient's plan of care and discharge plan were discussed and formulated based on the patient's specific needs.      PASRR     Not applicable.     Verline Glow, RN BSN CM  Clinical Case Manager ~ Medicine Team 5  [phone]

## 2023-09-12 NOTE — Progress Notes
 Pharmaceutical Services - Heparin Consult Management Note    Objective Data   Allergies: Patient has no known allergies.  Height:   Most recent documented height   08/09/23 1.676 m (5' 6'')     Actual Weight:   Most recent documented weight   09/12/23 89 kg       Heparin drip administrations (last 48 hours)       Date/Time Action Medication Dose Rate    09/10/23 1034 New Bag/ Syringe/ Cartridge    heparin 25,000 units in 0.45% NaCl 250 mL drip RTU 1,000 Units/hr 10 mL/hr            Anticoagulant/Antiplatelet administrations (last 48 hours)       None            Current Labs  Recent Labs     09/10/23  1006 09/10/23  1631 09/12/23  0440   APTT 34.6   < > 69.0*   HEPANTIXAUFH 1.04*  --   --    INR 1.0  --   --    HGB  --    < > 14.4   PLT  --    < > 168    < > = values in this interval not displayed.     No results found for: ''HEPPLTAB''    Assessment:  Protocol: Low Intensity: Anti-Xa goal 0.3-0.5 or aPTT goal 66-87  Anti-Xa aPTT Re-bolus if Ordered  (NTE 5,000 units*) /  Hold Infusion Rate Change Check Anti-Xa/aPTT   <0.1 <46 25 units/kg Increase by 3 units/kg/hr 6 hours after rate change   0.1-0.19 46-55.9 25 units/kg Increase by 2 units/kg/hr    0.2-0.29 56-65.9 - Increase by 1 unit/kg/hr    0.3-0.5 66-87 Continue current rate 6 hours;  If therapeutic x2, recheck qAM   0.51-0.6 87.1-98 - Decrease by 1 unit/kg/hr 6 hours after rate change   0.61-0.8 98.1-118 - Decrease by 2 units/kg/hr    0.81-1 118.1-140 Hold for 1 hour Decrease by 3 units/kg/hr    >1 >140 Hold infusion and repeat STAT anti-Xa/aPTT in 1 hour.  If anti-Xa <= 1 or aPTT <= 140, restart infusion and decrease by 4 units/kg/hr. Repeat anti-Xa/aPTT 6 hours after rate change.  If anti-Xa > 1 or aPTT >140, continue to hold and repeat anti-Xa/aPTT in 1 hour. Notify provider.   *For re-bolus dosing in ACS (STEMI, NSTEMI, UA), do not exceed maximum of 4,000 units.     Indication: Afib/Aflutter  Goal: aPTT 66-87 (last Eliquis ~6/9 PTA)  Dosing weight: 88 kg 68 y.o. 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 OSH admission 6/1 with irregular heart beats, transferred to [facility] for HLOC. Started on Eliquis ~08/2023 - tachy-brady syndrome, SSS, some Afib noted on OSH note.      6/10: Heparin start aPTT 66-87 for Afib  6/11: Around 2-2:30 am, patient had tachyarrhythmia, lightheadedness, consistent w/ tachy-brady syndrome. ICU monitoring was not deemed necessary given stable hemodynamics. EP planning for PPM, possibly 6/16    Plan:  Andrew [name] is a 68 y.o. male who has been referred to pharmacy for therapeutic heparin management.     Last  aPTT  at 0440 was 69 at goal x1  CBC today stable. Plan for PPM possibly 6/16    Infusion: Continue at 910 units/hr  Next aPTT ordered at 1100     Pharmacy will continue to monitor the patient?s clinical progress and communicate any changes with treatment team. For questions about this patient's heparin therapy, please call [phone] [facility]) or [phone] [facility]).    Blanchard Bunk, PharmD, 09/12/2023, 6:02 AM     Addendum:  Last  aPTT  at 1119 was 81.3, which is therapeutic x2    Plan:   Heparin rate: Continue at 910 units/hr  Next aPTT ordered at 6/13 AM.     Pharmacy will continue to monitor the patient?s clinical progress and communicate any changes with treatment team. For questions about this patient's heparin therapy, please call [phone] [facility]) or [phone] [facility]).    Blanchard Bunk, PharmD 09/12/2023 12:19 PM

## 2023-09-13 LAB — Magnesium: MAGNESIUM: 1.7 meq/L (ref 1.4–1.9)

## 2023-09-13 LAB — APTT: APTT: 72.7 s — ABNORMAL HIGH (ref 24.4–36.2)

## 2023-09-13 LAB — CBC: MEAN PLATELET VOLUME: 11.9 fL (ref 9.3–13.0)

## 2023-09-13 LAB — Basic Metabolic Panel
SODIUM: 141 mmol/L (ref 135–146)
TOTAL CO2: 23 mmol/L (ref 20–30)

## 2023-09-13 LAB — Differential Automated: LYMPHOCYTE PERCENT, AUTO: 30.8 % (ref 1.30–3.40)

## 2023-09-13 MED ADMIN — ALLOPURINOL 100 MG PO TABS: 200 mg | ORAL | @ 15:00:00 | Stop: 2023-09-22 | NDC 60687067711

## 2023-09-13 MED ADMIN — HEPARIN (PORCINE) IN NACL 25000-0.45 UT/250ML-% IV SOLN: 910 [IU]/h | INTRAVENOUS | @ 22:00:00 | Stop: 2023-09-13

## 2023-09-13 MED ADMIN — ATORVASTATIN CALCIUM 20 MG PO TABS: 20 mg | ORAL | @ 04:00:00 | Stop: 2023-09-22 | NDC 68084009811

## 2023-09-13 MED ADMIN — AMIODARONE HCL 150 MG/100 ML DRIP RTU: 150 mg | INTRAVENOUS | @ 10:00:00 | Stop: 2023-09-13 | NDC 43066015010

## 2023-09-13 MED ADMIN — HEPARIN (PORCINE) IN NACL 25000-0.45 UT/250ML-% IV SOLN: 910 [IU]/h | INTRAVENOUS | @ 22:00:00 | Stop: 2023-09-13 | NDC 63323051701

## 2023-09-13 MED ADMIN — CHOLECALCIFEROL 25 MCG (1000 UT) PO TABS: 25 ug | ORAL | @ 15:00:00 | Stop: 2023-09-22

## 2023-09-13 MED ADMIN — ASCORBIC ACID 500 MG PO TABS: 500 mg | ORAL | @ 15:00:00 | Stop: 2023-09-22 | NDC 00904052361

## 2023-09-13 MED ADMIN — AMLODIPINE BESYLATE 5 MG PO TABS: 5 mg | ORAL | @ 15:00:00 | Stop: 2023-09-17 | NDC 00904637061

## 2023-09-13 MED ADMIN — ASPIRIN 81 MG PO CHEW: 81 mg | ORAL | @ 15:00:00 | Stop: 2023-09-17 | NDC 66553000201

## 2023-09-13 MED ADMIN — MAGNESIUM SULFATE 4 GM/100ML IV SOLN: 4 g | INTRAVENOUS | @ 15:00:00 | Stop: 2023-09-13 | NDC 00338171540

## 2023-09-13 MED ADMIN — HEPARIN (PORCINE) IN NACL 25000-0.45 UT/250ML-% IV SOLN: 910 [IU]/h | INTRAVENOUS | @ 22:00:00 | Stop: 2023-09-14 | NDC 63323051701

## 2023-09-13 MED ADMIN — AMIODARONE HCL 200 MG PO TABS: 200 mg | ORAL | @ 16:00:00 | Stop: 2023-09-19 | NDC 60687043711

## 2023-09-13 MED ADMIN — AMIODARONE HCL 200 MG PO TABS: 200 mg | ORAL | @ 04:00:00 | Stop: 2023-09-19 | NDC 60687043711

## 2023-09-13 NOTE — H&P
 UPDATED H&P REQUIREMENT    WHAT IS THE STATUS OF THE [name] MOST CURRENT HISTORY AND PHYSICAL?   - The most current H&P was performed within the past 24 hours. No additional updated H&P documentation is necessary.     REFER TO MEDICAL STAFF POLICIES REGARDING PRE-PROCEDURE HISTORY AND PHYSICAL EXAMINATION AND UPDATED H&P REQUIREMENTS BELOW:    Karle Ovens [facility] Medical Staff Policy 200 - For Patients Undergoing Procedures Requiring Moderate or Deep Sedation, General Anesthesia or Regional Anesthesia    Contents of a History and Physical Examination (H&P):    The H&P shall consist of chief complaint, history of present illness, allergies and medications, relevant social and family history, past medical history, review of systems and physical examination, and assessment and plan appropriate to the patient's age.    For Patients Undergoing Procedures Requiring Moderate or Deep Sedation, General Anesthesia or Regional Anesthesia:    1. An H&P shall be performed within 24 hours prior to the procedure by a qualified member of the medical staff or designee with appropriate privileges, except as noted in item 2 below.    2. If a complete history and physical was performed within thirty (30) calendar days prior to the patient?s admission to the Medical Center for elective surgery, a member of the medical staff assumes the responsibility for the accuracy of the clinical information and will need to document in the medical record within twenty-four (24) hours of admission and prior to surgery or major invasive procedure, that they either attest that the history and physical has been reviewed and accepted, or document an update of the original history and physical relevant to the patient's current clinical status.    3. Providing an H&P for patients undergoing surgery under local anesthesia is at the discretion of the Attending Physician.     4. When a procedure is performed by a dentist, podiatrist or other practitioner who is not privileged to perform an H&P, the anesthesiologist?s assessment immediately prior to the procedure will constitute the 24 hour re-assessment.The dentist, podiatrist or other practitioner who is not privileged to perform an H&P will document the history and physical relevant to the procedure.    5. If the H&P and the written informed consent for the surgery or procedure are not recorded in the patient's medical record prior to surgery, the operation shall not be performed unless the attending physician states in writing that such a delay could lead to an adverse event or irreversible damage to the patient.    6. The above requirements shall not preclude the rendering of emergency medical or surgical care to a patient in dire circumstances.

## 2023-09-13 NOTE — Progress Notes
 NUTRITION IN-DEPTH SCREEN (Adult)    Admit Date: 09/09/2023     Date of Birth: 03/13/1956 Gender: male MRN: [id number]     Date of Screening 09/13/2023   Subjective: Chart reviewed, patient seen. Spouse at bedside. NPO for pending procedure. Normal appetite prior to admission and here. Adequate PO intake noted while in-house, eating >75% of meals per documentation. NKFA confirmed. No questions or nutrition concerns expressed during visit.      Past Medical History:   Diagnosis Date    CVA (cerebral vascular accident) (HCC/RAF)     H/O aortic valve replacement     Kidney stone     Urinary frequency     Past Surgical History:   Procedure Laterality Date    AORTIC VALVE REPLACEMENT           Anthropometrics     Height: 167.6 cm (5' 5.98'')  Admit Weight: 87.8 kg (193 lb 9 oz) (09/09/23 2312)  Current Weight: 88.6 kg (195 lb 5.2 oz)  BMI: 31.54  IBW: 64 kg/142 lbs  IBW %: 138  Adj BW (if applicable): 70 kg  UBW:    UBW %:         Weight History (last 5)  Weights Weight   08/09/2023  10:57 AM 91.6 kg   09/09/2023  11:12 PM 87.8 kg   09/11/2023   4:20 AM 88.6 kg   09/12/2023   5:00 AM 89 kg   09/13/2023   4:35 AM 88.6 kg        Allergies   Patient has no known allergies.     Cultural / Religious / Ethnic Food Preferences   None       Nutrition Prior to Admission   Regular diet     Nutrition Risk Factors   Moderate Nutrition Risk Factors: Cerebrovascular Accident (CVA)/Stroke  Acuity Level: 2-Moderate risk      Diet Orders     Diets/Supplements/Feeds   Diet    Diet NPO Reason for NPO: Procedure     Start Date/Time: 09/13/23 0010      Number of Occurrences: Until Specified     Order Questions:      Reason for NPO Procedure      Impression   PO % consumed: 76 to 100% most meals  Impression: Normal appetite; Good PO Intake while in-house, Weights stable     Diet Education   No diet education needs at this time      FDI Target Drugs: No        Nutrition Care Plan   Plan: Trend weights, Monitor days of NPO/Clear liquids         Will follow per policy    Author:  Lashun Mccants Sibal Ginamarie Banfield, DTR, pager [phone]  09/13/2023 11:44 AM

## 2023-09-13 NOTE — Progress Notes
 .  CCU Progress Note      PMD: Jeana Francisco, MD  DATE OF SERVICE: 09/13/2023  HOSPITAL DAY: 4  CHIEF COMPLAINT: No chief complaint on file.      ID: Andrew [name] is a 68 y.o. male 68 y.o. w/ h/o bicuspid AV sp TAVR 2013 (bioprosthetic) cb CVA w residual LLE weakess, presented to OSH w pre-syncope transferred for reported tachy-brady pending PPM placement.     Last 24 Hour/Overnight Events   ON SVT     Subjective/Review of Systems   Patient states that he felt palpitations last night.     14-point ROS negative except as outlined above.    Medications     Scheduled:  allopurinol, 200 mg, Oral, Daily  amiodarone, 200 mg, Oral, BID  amLODIPine, 5 mg, Oral, Daily  ascorbic acid, 500 mg, Oral, Daily with breakfast  aspirin, 81 mg, Oral, Daily  atorvastatin, 20 mg, Oral, Nightly  cholecalciferol, 25 mcg, Oral, Daily   Continuous:   heparin 25,000 units/250 mL drip 910 Units/hr (09/13/23 1300)      PRN:  polyethylene glycol, senna      Physical Exam   Temp:  [36.2 ?C (97.2 ?F)-36.8 ?C (98.2 ?F)] 36.3 ?C (97.3 ?F)  Heart Rate:  [66-193] 81  Resp:  [17-20] 18  BP: (125-165)/(82-121) 145/102  NBP Mean:  [102-133] 114  SpO2:  [91 %-98 %] 93 %   UOP: Urine:  [0 mL-600 mL] 275 mL I/Os:   Intake/Output Summary (Last 24 hours) at 09/13/2023 1428  Last data filed at 09/13/2023 1300  Gross per 24 hour   Intake 350.08 ml   Output 2300 ml   Net -1949.92 ml     Gen: Well-developed male in no acute distress  Eyes: PERRL, EOMI  ENT: OP clear, MMM  Neck: Supple, trachea midline, no cervical or supraclavicular lymphadenopathy  CV: Intermittently tachy then bradycardic   Pulm: CTAB, no wheezes/rales/rhonchi, normal work of breathing  Abd: NABS, soft, flat, NT, no masses or organomegaly  Skin: Warm and dry, no rashes  Ext: No cyanosis or edema. Distal pulses 2+ and symmetric  Neuro: A&Ox4, CNII-XII intact, 4/5 strength in LLE, 5/5 strength in all other ext, sensation grossly intact    Laboratory Data   CBC:  Recent Labs 09/13/23  0326 09/12/23  0440 09/11/23  0158   WBC 6.66 6.09 7.10   NEUTABS 3.78 3.31 3.46   HGB 15.3 14.4 14.5   HCT 44.3 43.3 44.2   MCV 85.0 84.9 85.8   PLT 175 168 191     BMP:  Recent Labs     09/13/23  0326 09/12/23  0440 09/11/23  1457   NA 141 140 141   K 4.0 4.5 4.2   CL 105 106 104   CO2 23 24 26    BUN 10 13 11    CREAT 0.70 0.71 0.66   GLUCOSE 115* 100* 124*   CALCIUM 9.0 8.5* 8.5*   MG 1.7 2.0* 2.1*     LFT:  No results for input(s): ''TOTPRO'', ''ALBUMIN'', ''BILITOT'', ''BILICON'', ''ALT'', ''AST'', ''ALKPHOS'', ''GGT'', ''AMYLASE'', ''LIPASE'' in the last 72 hours.  COAGS:  Recent Labs     09/13/23  0326 09/12/23  1119 09/12/23  0440   APTT 72.7* 81.3* 69.0*     UA:  No results for input(s): ''SPECGRAVUR'', ''PHUR'', ''BLDUR'', ''KETONESUR'', ''GLUCOSEUR'', ''PROTCLUR'', ''NITRITEUR'', ''LEUKESTUR'', ''RBCSUR'', ''WBCSUR'' in the last 72 hours.    Microbiology     Recent Results (from the past week)  MRSA Surveillance, Nares    Collection Time: 09/09/23 11:45 PM    Specimen: Nares; Respiratory, Upper   Result Value Ref Range    MRSA Surveillance       No Methicillin-resistant Staphylococcus aureus isolated.        Imaging     No imaging has been resulted in the last 24 hours     Assessment/Plan   Andrew [name] is a 68 y.o. male 68 y.o. w/ h/o bicuspid AV sp TAVR 2013 (bioprosthetic) cb CVA w residual LLE weakess, presented to OSH w pre-syncope transferred for reported tachy-brady pending PPM placement.     #Tachy/SVTGLENWOOD Cleaves Arrhythmia  #pAfib  EKG and cardiac monitoring shows episodes of SVT then followed by sinus bradycardia with sinus pauses  - Required rebolus 1x  - amiodarone gtt  - heparin gtt     #Hx bicupsid AV sp TAVR:   - ASA      #Hx periop CVA  Noted to have recovered LLE strength but continues to feel off balance   - ASA  - Atorvastatin  - PT/OT after PPM     #HTN:  - Amlodipine 5mg       #HLD:   - Atorvastatin     #Gout:   - Allopurinol      #Osteoporosis   #Vit D- Deficiency   - Continue on Vit Supplements      #HX of medial malleolus fracture   -CAM boot as tolerated     Inpatient Checklist:  Diet: Orders Placed This Encounter      Diet NPO Reason for NPO: Procedure    DVT Prophylaxis: Heparin Gtt  GI Prophylaxis: not indicated  Central Lines: none  Tubes/Drains: none    Disposition:     Code Status:   Full Code  Goals of Care Discussed: code status addressed    Contact:  Primary Emergency Contact: Dathan, [place], Home Phone: [phone]    DWA Dr. Allegra    Author:  Elsie Chihuahua  PGY-3 Internal Medicine  [phone]  09/13/2023 2:28 PM    I have personally interviewed and examined this patient with Dr. Chihuahua on the date of service and I agree with the above assessment and plan which we formulated together. I personally reviewed all the available diagnostic studies. The results, findings, medications, and plan of care were all reviewed and communicated with the patient and/or family.     Myangel Summons, MD  Advanced Heart Failure and Transplantation Cardiology  Pager [phone]    MDM: High   Number/Complexity of Problem(s):   High: Patient with acute or chronic illness or injury that poses a threat to life or bodily function: Tachy Brady syndrome, a fib with RVR,     Amount and/or Complexity of Data Reviewed/Analyzed   [x]  Reviewed/Ordered 3 or more unique labs/tests (Cat 1)    [x]  Independently interpreted studies performed by another physician/health care professional (Cat 2)   []  Discussed management/test interpretation with external health care professional (Cat 3)      Risk of Complications/Morbidity/Mortality of Patient Management   High risk of complication and/or morbidity associated with patient management, due to drug therapy requiring intensive monitoring for toxicity: Heparin drip

## 2023-09-13 NOTE — Other
 [name] Clinical Goal:   Clinical Goal(s) for the Shift: safety comfort  Identify possible barriers to advancing the care plan:   Stability of the patient: Moderately Unstable - medium risk of patient condition declining or worsening    Progression of [name] Clinical Goal: remained in sinus 80 no arrythmia noted sbp 140s saturating high 90s no cp nor sob no s/s of bleeding while on heparin drip infusing at 910 units/hour  lytes replaced safety comfort pending pacemaker placement tentative scheduled tom heparin drip to be stop at midnight

## 2023-09-13 NOTE — Consults
 Inpatient Cardiac Electrophysiology Consultation    DATE OF SERVICE: 09/13/2023  PATIENT: Andrew [name]   MRN: [id number]  DOB: 11-Sep-1955    REFERRING PHYSICIAN: Vucicevic, Darko, MD    REASON FOR CONSULTATION: History of syncope, rapid atrial fibrillation, and sinus bradycardia    36M admitted for further management of rapid atrial fibrillation     MEDICAL PROBLEMS  Paroxysmal atrial fibrillation (CHADSVASc score = 4 due to age, stroke, and hypertension)  Anticoagulation: apixaban 5mg  twice daily  Rate control: diltiazem and previously metoprolol  History of bicuspid aortic valve stenosis  Status post surgical, bioprosthetic aortic valve replacement January 2013 [facility] Ease #25; Dr. Talitha; [facility])  Postoperative stroke, with residual left-sided and right lower extremity weakness    HISTORY OF PRESENT ILLNESS:    Index diagnosis of arrhythmia was that of paroxysmal atrial fibrillation following elective surgical bioprosthetic aortic valve replacement at [facility] in 2013; he did not have recurrence following this isolated episode and thus was not managed with antiarrhythmic nor anticoagulation regimen.     He presented to the hospital twice more in May 2025, once for treatment of dyspnea and suspected heart failure, and again for palpitation, where he was diagnosed with recurrence of atrial fibrillation and rapid ventricular rates; this was managed with diltiazem and apixaban. As he re-presented for palpitation and dizziness on Aug 31, 2023 to [facility]) he was treated with IV diltiazem and found to have subsequent pauses up to 5 seconds' duration. He does not recall symptoms, but rather than he was given a diagnosis of tachy-brady syndrome and recommended to undergo pacemaker placement.     Initiated on amiodarone 09/10/2023 for treatment of atrial arrhythmia; subsequently had recurrence of SVT (likely AT) with symptomatic conversion pause.     PAST MEDICAL HISTORY:  Past Medical History:   Diagnosis Date    CVA (cerebral vascular accident) (HCC/RAF)     H/O aortic valve replacement     Kidney stone     Urinary frequency        PAST SURGICAL HISTORY:  Past Surgical History:   Procedure Laterality Date    AORTIC VALVE REPLACEMENT         FAMILY HISTORY:  Family History   Problem Relation Age of Onset    No Known Problems Mother     Other (gastric ulcer) Father     Uterine cancer Sister     Stroke Brother     Kidney failure Brother     Colon cancer Neg Hx     Lung cancer Neg Hx     Prostate cancer Neg Hx        SOCIAL HISTORY:  Social History     Socioeconomic History    Marital status: Married   Tobacco Use    Smoking status: Former     Current packs/day: 0.00     Average packs/day: 1 pack/day for 15.0 years (15.0 ttl pk-yrs)     Types: Cigarettes     Start date: 05/03/1975     Quit date: 05/02/1990     Years since quitting: 33.3    Smokeless tobacco: Never   Substance and Sexual Activity    Alcohol use: Not Currently     Comment: occasionally has red wine    Drug use: Not Currently     Social Drivers of Health     Financial Resource Strain: Low Risk  (09/10/2023)    Financial Resource Strain     Difficulty of  Paying Living Expenses: Not very hard       OUTPATIENT MEDICATIONS:  Current Facility-Administered Medications   Medication Dose Route Frequency    allopurinol  tab 200 mg  200 mg Oral Daily    [COMPLETED] amiodarone  150 mg in dextrose 100 mL IVPB RTU 150 mg  150 mg Intravenous Once    [COMPLETED] amiodarone  150 mg in dextrose 100 mL IVPB RTU 150 mg  150 mg Intravenous Once    [COMPLETED] amiodarone  150 mg in dextrose 100 mL IVPB RTU 150 mg  150 mg Intravenous Once    amiodarone  tab 200 mg  200 mg Oral BID    amLODIPine  tab 5 mg  5 mg Oral Daily    ascorbic acid  tab 500 mg  500 mg Oral Daily with breakfast    aspirin  chew tab 81 mg  81 mg Oral Daily    atorvastatin  tab 20 mg  20 mg Oral Nightly    heparin  25,000 units in 0.45% NaCl 250 mL drip RTU  910 Units/hr Intravenous Continuous    heparin  per pharmacy   Does not apply Continuous    [COMPLETED] lactated ringers  IV soln bolus 1,000 mL  1,000 mL Intravenous Once    [COMPLETED] lactated ringers  IV soln bolus 500 mL  500 mL Intravenous Once    [COMPLETED] magnesium  sulfate 2 g in water for injection 50 mL RTU  2 g Intravenous Once    [COMPLETED] magnesium  sulfate 2 g in water for injection 50 mL RTU  2 g Intravenous Once    [COMPLETED] magnesium  sulfate 4 g in water for injection 100 mL RTU  4 g Intravenous Once    [COMPLETED] magnesium  sulfate 4 g in water for injection 100 mL RTU  4 g Intravenous Once    polyethylene glycol pwd pkt 17 g  17 g Oral Daily PRN    [COMPLETED] potassium chloride  CR tab 20 mEq  20 mEq Oral Once    [COMPLETED] potassium chloride  ER tab 20 mEq  20 mEq Oral Once    senna tab 1 tablet  1 tablet Oral QHS PRN    [COMPLETED] sodium chloride  0.9% IV soln        vitamin D (cholecalciferol ) tab 25 mcg  25 mcg Oral Daily    [DISCONTINUED] amiodarone  360 mg in dextrose 200 mL drip RTU (peripheral)  1 mg/min Intravenous Continuous    [DISCONTINUED] amiodarone  360 mg in dextrose 200 mL drip RTU (peripheral)  1 mg/min Intravenous Continuous    [DISCONTINUED] amiodarone  360 mg in dextrose 200 mL drip RTU (peripheral)  0.5 mg/min Intravenous Continuous    [DISCONTINUED] amiodarone  360 mg in dextrose 200 mL drip RTU (peripheral)  0.5 mg/min Intravenous Continuous    [DISCONTINUED] apixaban  tab 5 mg  5 mg Oral BID    [DISCONTINUED] atorvastatin  tab 20 mg  20 mg Oral Nightly    [DISCONTINUED] metoprolol  tartrate tab 12.5 mg  12.5 mg Oral BID    [DISCONTINUED] Patient Transfer (Notification to Pharmacy)-REQUIRED   Does not apply Pharmacy Communication    [DISCONTINUED] polyethylene glycol pwd pkt 17 g  17 g Oral Daily        ALLERGIES:  No Known Allergies    INPATIENT SCHEDULED MEDICATIONS:   allopurinol   200 mg Oral Daily    amiodarone   200 mg Oral BID    amLODIPine   5 mg Oral Daily    ascorbic acid   500 mg Oral Daily with breakfast    aspirin   81 mg Oral Daily  atorvastatin  20 mg Oral Nightly    cholecalciferol  25 mcg Oral Daily         PHYSICAL EXAMINATION:  VITALS: BP 140/106  ~ Pulse 66  ~ Temp 36.2 ?C (97.2 ?F) (Oral)  ~ Resp 19  ~ Wt 195 lb 5.2 oz (88.6 kg)  ~ SpO2 94%  ~ BMI 31.53 kg/m?    Temp:  [36.2 ?C (97.2 ?F)-36.8 ?C (98.2 ?F)] 36.2 ?C (97.2 ?F)  Heart Rate:  [65-193] 66  Resp:  [17-20] 19  BP: (125-165)/(82-121) 140/106  NBP Mean:  [102-133] 116  SpO2:  [91 %-98 %] 94 %        Intake/Output Summary (Last 24 hours) at 09/13/2023 1020  Last data filed at 09/13/2023 9157  Gross per 24 hour   Intake 465.68 ml   Output 2325 ml   Net -1859.32 ml       General: well developed well nourished in no acute distress  Eyes: extraocular movements intact, anicteric.  ENT: oropharynx clear, moist mucous membranes  Neck: JVP not assessed. No lymphadenopathy  CV: Regular rhythm by peripheral exam  Resp: Good air entry throughout all fields bilaterally, no wheezes or crackles.  GI: abd soft, non tender, non distended, normoactive bowel sounds  MSK: extremities without clubbing or cyanosis.  No significant edema  Derm: warm, dry , and well perfused  Neuro: grossly non focal      LABORATORY:  Lab Results   Component Value Date    WBC 6.66 09/13/2023    HGB 15.3 09/13/2023    HCT 44.3 09/13/2023    MCV 85.0 09/13/2023    PLT 175 09/13/2023    Lab Results   Component Value Date    K 4.0 09/13/2023    MG 1.7 09/13/2023    CREAT 0.70 09/13/2023    Lab Results   Component Value Date    ALT 109 (H) 09/10/2023    AST 35 09/10/2023    ALKPHOS 75 09/10/2023    BILITOT 0.5 09/10/2023      Lab Results   Component Value Date    INR 1.0 09/10/2023    APTT 72.7 (H) 09/13/2023    Lab Results   Component Value Date    TSH 4.1 09/10/2023    Lab Results   Component Value Date    HGBA1C 6.4 (H) 05/25/2023     Lab Results   Component Value Date    TROPONIN 22 (H) 09/11/2023         DIAGNOSTIC STUDIES:    Telemetry 09/11/2023: intermittent runs of rapid SVT, probable atrial tachycardia (long RP interval) with conversion pause, associated with symptoms:          12-lead ECG 09/09/2023: sinus rhythm at 70 bpm with PR interval and QRS duration 130-178ms:         Prior ECGs dating back to 2022 demonstrated sinus rhythm at 74 bpm with PR interval and QRS duration in a right bundloid pattern:        TTE 08/08/2023: LV EF 60-65% with normal LV size and moderate concentric LVH.       Outside records and tracings from [facility] May 2025:     08/23/2023 ECG demonstrating sinus with premature atrial complexes:      08/23/2023: sinus rhythm with initiation of narrow complex tachycardia, irregular, most likely atrial fibrillation:       Telemetry report 08/23/2023 demonstrating SVT with pauses, likely conversion:  ASSESSMENT:    39M admitted for further management of rapid atrial fibrillation     MEDICAL PROBLEMS    Paroxysmal atrial fibrillation (CHADSVASc score = 4 due to age, stroke, and hypertension)  Anticoagulation: apixaban 5mg  twice daily  Rate control: diltiazem and previously metoprolol  History of bicuspid aortic valve stenosis  Status post surgical, bioprosthetic aortic valve replacement January 2013 [facility] Ease #25; Dr. Talitha; [facility])  Postoperative stroke, with residual left-sided and right lower extremity weakness    DISCUSSION:    Cardiac Electrophysiology was consulted for management of paroxysmal atrial fibrillation with rapid ventricular rates, and reported pauses noted on telemetry.    Review of telemetry data from [facility] appears to corroborate presence of sinus pauses, either due to sinus node dysfunction or in the context of conversion from bouts of atrial tachycardia.     Due to occurrence of symptomatic pauses during treatment of atrial arrhythmia with amiodarone (09/11/2023), recommended pacemaker implantation - discussed the benefits, risks, and indications associated with this with patient and wife, who elected to proceed. All questions were answered and informed consent completed 09/11/2023.     RECOMMENDATIONS:    Maintain on telemetry while inpatient.   Continue amiodarone, 200mg  twice daily for two weeks, followed by 200mg  daily thereafter.   Maintain bilateral, functional upper extremity peripheral IV catheters.   Will plan for add-on inpatient pacemaker implantation, pending coordination of Cardiac Anesthesia availability.     Bebe Farr, MD  Clinical Cardiac Electrophysiology Fellow    I have discussed, seen and examined the patient with the Cardiac Electrophysiology Consult Team. I agree with the above Assessment and Plan which I helped formulate.     Current plan of care was discussed in detail with the patient/family, all questions were answered.    A total of 53 minutes of care time were dedicated to today's visit with greater than 50% dedicated to counseling.    Additional time was used for pre visit review of EMR (including but not limited to other provider notes, procedure results, test results, imaging and others) post visit documentation, coordination of care and others.       Eric Melnick, MD  Attending Physician   Cardiac Electrophysiolgy

## 2023-09-13 NOTE — Progress Notes
 Pharmaceutical Services - Heparin Consult Management Note    Objective Data   Allergies: Patient has no known allergies.  Height:   Most recent documented height   08/09/23 1.676 m (5' 6'')     Actual Weight:   Most recent documented weight   09/13/23 88.6 kg       Heparin drip administrations (last 48 hours)       Date/Time Action Medication Dose Rate    09/12/23 1506 New Bag/ Syringe/ Cartridge    heparin 25,000 units in 0.45% NaCl 250 mL drip RTU 910 Units/hr 9.1 mL/hr    09/11/23 2003 Rate/Dose Change*    heparin 25,000 units in 0.45% NaCl 250 mL drip RTU 910 Units/hr 9.1 mL/hr    09/11/23 1116 New Bag/ Syringe/ Cartridge    heparin 25,000 units in 0.45% NaCl 250 mL drip RTU 820 Units/hr 8.2 mL/hr    09/11/23 0531 Rate/Dose Change*    heparin 25,000 units in 0.45% NaCl 250 mL drip RTU 820 Units/hr 8.2 mL/hr            Anticoagulant/Antiplatelet administrations (last 48 hours)       None            Current Labs  Recent Labs     09/13/23  0326   APTT 72.7*   HGB 15.3   PLT 175     No results found for: ''HEPPLTAB''    Assessment:  Protocol: Low Intensity: Anti-Xa goal 0.3-0.5 or aPTT goal 66-87  Anti-Xa aPTT Re-bolus if Ordered  (NTE 5,000 units*) /  Hold Infusion Rate Change Check Anti-Xa/aPTT   <0.1 <46 25 units/kg Increase by 3 units/kg/hr 6 hours after rate change   0.1-0.19 46-55.9 25 units/kg Increase by 2 units/kg/hr    0.2-0.29 56-65.9 - Increase by 1 unit/kg/hr    0.3-0.5 66-87 Continue current rate 6 hours;  If therapeutic x2, recheck qAM   0.51-0.6 87.1-98 - Decrease by 1 unit/kg/hr 6 hours after rate change   0.61-0.8 98.1-118 - Decrease by 2 units/kg/hr    0.81-1 118.1-140 Hold for 1 hour Decrease by 3 units/kg/hr    >1 >140 Hold infusion and repeat STAT anti-Xa/aPTT in 1 hour.  If anti-Xa <= 1 or aPTT <= 140, restart infusion and decrease by 4 units/kg/hr. Repeat anti-Xa/aPTT 6 hours after rate change.  If anti-Xa > 1 or aPTT >140, continue to hold and repeat anti-Xa/aPTT in 1 hour. Notify provider. *For re-bolus dosing in ACS (STEMI, NSTEMI, UA), do not exceed maximum of 4,000 units.     Indication: Afib/Aflutter  Goal: aPTT 66-87 (last Eliquis ~6/9 PTA)  Dosing weight: 88 kg      68 y.o. 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 OSH admission 6/1 with irregular heart beats, transferred to [facility] for HLOC. Started on Eliquis ~08/2023 - tachy-brady syndrome, SSS, some Afib noted on OSH note.      6/10: Heparin start aPTT 66-87 for Afib  6/11: Around 2-2:30 am, patient had tachyarrhythmia, lightheadedness, consistent w/ tachy-brady syndrome. ICU monitoring was not deemed necessary given stable hemodynamics. EP planning for PPM, possibly 6/16  6/12: Upgrade to CCU d/t unstable arrhythmia. Plan for add-on PPM     Plan:  Andrew [name] is a 68 y.o. male who has been referred to pharmacy for therapeutic heparin management.  Last  aPTT  at 0326 was 72.7, which is therapeutic.  CBC stable     Infusion: Continue at 910 units/hr    Next aPTT ordered at 6/14 AM + anti-Xa     Pharmacy will continue to monitor the patient?s clinical progress and communicate any changes with treatment team. For questions about this patient's heparin therapy, please call [phone] [facility]) or [phone] [facility]).    Andrew [name], PharmD, 09/13/2023, 5:23 AM       MD ordered to hold heparin at midnight - for possible PPM   [facility] [name] 09/13/2023 3:06 PM    Addendum:    Heparin DC for possible PPM - plan for add on case if available    Pharmacy will continue to monitor the patient?s clinical progress and communicate any changes with treatment team. For questions about this patient's heparin therapy, please call [phone] [facility]) or [phone] [facility]).    Andrew [name], PharmD 09/14/2023 12:22 AM

## 2023-09-14 ENCOUNTER — Ambulatory Visit: Payer: Commercial Managed Care - Pharmacy Benefit Manager

## 2023-09-14 ENCOUNTER — Ambulatory Visit: Payer: Commercial Managed Care - HMO

## 2023-09-14 ENCOUNTER — Ambulatory Visit: Payer: PRIVATE HEALTH INSURANCE

## 2023-09-14 LAB — Basic Metabolic Panel: CREATININE: 0.83 mg/dL (ref 0.60–1.30)

## 2023-09-14 LAB — Differential Automated: ABSOLUTE EOS COUNT: 0.19 10*3/uL (ref 0.00–0.50)

## 2023-09-14 LAB — CBC: RED BLOOD CELL COUNT: 5.39 x10E6/uL (ref 4.41–5.95)

## 2023-09-14 LAB — Magnesium: MAGNESIUM: 1.9 meq/L (ref 1.4–1.9)

## 2023-09-14 MED ADMIN — ALLOPURINOL 100 MG PO TABS: 200 mg | ORAL | @ 15:00:00 | Stop: 2023-09-22 | NDC 60687067711

## 2023-09-14 MED ADMIN — ASPIRIN 81 MG PO CHEW: 81 mg | ORAL | @ 15:00:00 | Stop: 2023-09-17 | NDC 66553000201

## 2023-09-14 MED ADMIN — ONDANSETRON HCL 4 MG/2ML IJ SOLN: INTRAVENOUS | @ 19:00:00 | Stop: 2023-09-14 | NDC 60505613005

## 2023-09-14 MED ADMIN — PHENYLEPHRINE HCL (PRESSORS) 10 MG/ML IV SOLN: INTRAVENOUS | @ 18:00:00 | Stop: 2023-09-14 | NDC 23155062041

## 2023-09-14 MED ADMIN — HEPARIN (PORCINE) IN NACL 25000-0.45 UT/250ML-% IV SOLN: 910 [IU]/h | INTRAVENOUS | @ 07:00:00 | Stop: 2023-09-14

## 2023-09-14 MED ADMIN — AMLODIPINE BESYLATE 5 MG PO TABS: 5 mg | ORAL | @ 15:00:00 | Stop: 2023-09-17 | NDC 00904637061

## 2023-09-14 MED ADMIN — FENTANYL CITRATE (PF) 50 MCG/ML IJ SOLN (MULTI GPI): INTRAVENOUS | @ 18:00:00 | Stop: 2023-09-14 | NDC 00409909422

## 2023-09-14 MED ADMIN — PROPOFOL 200 MG/20ML IV EMUL (ANES): INTRAVENOUS | @ 17:00:00 | Stop: 2023-09-14

## 2023-09-14 MED ADMIN — ACETAMINOPHEN 10 MG/ML IV SOLN: INTRAVENOUS | @ 18:00:00 | Stop: 2023-09-14 | NDC 63323043400

## 2023-09-14 MED ADMIN — ATORVASTATIN CALCIUM 20 MG PO TABS: 20 mg | ORAL | @ 04:00:00 | Stop: 2023-09-22 | NDC 68084009811

## 2023-09-14 MED ADMIN — VANCOMYCIN 1 MG/ML IRRIGATION SOLN: 500 mL | @ 18:00:00 | Stop: 2023-09-14 | NDC 00409433201

## 2023-09-14 MED ADMIN — ASCORBIC ACID 500 MG PO TABS: 500 mg | ORAL | @ 15:00:00 | Stop: 2023-09-22 | NDC 00904052361

## 2023-09-14 MED ADMIN — POTASSIUM CHLORIDE ER 10 MEQ PO TBCR: 40 meq | ORAL | @ 16:00:00 | Stop: 2023-09-14 | NDC 00245531689

## 2023-09-14 MED ADMIN — DERMABOND MINI SKIN ADHESIVE: @ 18:00:00 | Stop: 2023-09-14 | NDC 08252114512

## 2023-09-14 MED ADMIN — AMIODARONE HCL 200 MG PO TABS: 200 mg | ORAL | @ 04:00:00 | Stop: 2023-09-19 | NDC 60687043711

## 2023-09-14 MED ADMIN — PLASMA-LYTE-A PH 7.4 IV SOLN: INTRAVENOUS | @ 17:00:00 | Stop: 2023-09-14 | NDC 00338022104

## 2023-09-14 MED ADMIN — VANCOMYCIN 1 GM/200 ML RTU: 1 g | INTRAVENOUS | @ 18:00:00 | Stop: 2023-09-14 | NDC 00338355248

## 2023-09-14 MED ADMIN — LIDOCAINE HCL (PF) 1 % IJ SOLN: @ 18:00:00 | Stop: 2023-09-14 | NDC 00409427902

## 2023-09-14 MED ADMIN — CEFAZOLIN SODIUM 1 G IJ SOLR: INTRAVENOUS | @ 18:00:00 | Stop: 2023-09-14 | NDC 00143992490

## 2023-09-14 MED ADMIN — DERMABOND MINI SKIN ADHESIVE: @ 19:00:00 | Stop: 2023-09-14 | NDC 08252114512

## 2023-09-14 MED ADMIN — CHOLECALCIFEROL 25 MCG (1000 UT) PO TABS: 25 ug | ORAL | @ 15:00:00 | Stop: 2023-09-22

## 2023-09-14 MED ADMIN — MAGNESIUM SULFATE 4 GM/100ML IV SOLN: 4 g | INTRAVENOUS | @ 16:00:00 | Stop: 2023-09-14 | NDC 00338171540

## 2023-09-14 MED ADMIN — PROPOFOL 200 MG/20ML IV EMUL (ANES): INTRAVENOUS | @ 18:00:00 | Stop: 2023-09-14

## 2023-09-14 MED ADMIN — AMIODARONE HCL 200 MG PO TABS: 200 mg | ORAL | @ 15:00:00 | Stop: 2023-09-19 | NDC 60687043711

## 2023-09-14 MED ADMIN — LIDOCAINE HCL (PF) 1 % IJ SOLN: INTRAVENOUS | @ 17:00:00 | Stop: 2023-09-14 | NDC 00409427902

## 2023-09-14 NOTE — Other
 [name] Clinical Goal:   Clinical Goal(s) for the Shift: safety comfort  Identify possible barriers to advancing the care plan:   Stability of the patient: Moderately Unstable - medium risk of patient condition declining or worsening    Progression of [name] Clinical Goal: v/s stable s/p pacemaker placed LU chest wall DDD rate 130 sinus 80s no ectopy  CXR done safety comfort lytes replaced plan possible d/c home tom

## 2023-09-14 NOTE — Progress Notes
 .  CCU Progress Note      PMD: Jeana Francisco, MD  DATE OF SERVICE: 09/14/2023  HOSPITAL DAY: 5  CHIEF COMPLAINT: No chief complaint on file.      ID: Keenan Trefry is a 68 y.o. male 68 y.o. w/ h/o bicuspid AV sp TAVR 2013 (bioprosthetic) cb CVA w residual LLE weakess, presented to OSH w pre-syncope transferred for reported tachy-brady pending PPM placement.     Last 24 Hour/Overnight Events   ON SVT     Subjective/Review of Systems   Patient states that he felt palpitations last night.     14-point ROS negative except as outlined above.    Medications     Scheduled:  allopurinol , 200 mg, Oral, Daily  amiodarone , 200 mg, Oral, BID  amLODIPine , 5 mg, Oral, Daily  ascorbic acid , 500 mg, Oral, Daily with breakfast  aspirin , 81 mg, Oral, Daily  atorvastatin , 20 mg, Oral, Nightly  magnesium  sulfate IV, 4 g, Intravenous, Once  potassium chloride , 40 mEq, Oral, Once  cholecalciferol , 25 mcg, Oral, Daily   Continuous:       PRN:  polyethylene glycol, senna      Physical Exam   Temp:  [36 ?C (96.8 ?F)-36.6 ?C (97.9 ?F)] 36.3 ?C (97.3 ?F)  Heart Rate:  [77-84] 79  Resp:  [17-19] 19  BP: (129-145)/(95-114) 145/114  NBP Mean:  [107-125] 125  SpO2:  [92 %-97 %] 96 %   UOP: Urine:  [0 mL-275 mL] 175 mL I/Os:   Intake/Output Summary (Last 24 hours) at 09/14/2023 0826  Last data filed at 09/14/2023 0417  Gross per 24 hour   Intake 347.32 ml   Output 1125 ml   Net -777.68 ml     Gen: Well-developed male in no acute distress  Eyes: PERRL, EOMI  ENT: OP clear, MMM  Neck: Supple, trachea midline, no cervical or supraclavicular lymphadenopathy  CV: Intermittently tachy then bradycardic   Pulm: CTAB, no wheezes/rales/rhonchi, normal work of breathing  Abd: NABS, soft, flat, NT, no masses or organomegaly  Skin: Warm and dry, no rashes  Ext: No cyanosis or edema. Distal pulses 2+ and symmetric  Neuro: A&Ox4, CNII-XII intact, 4/5 strength in LLE, 5/5 strength in all other ext, sensation grossly intact    Laboratory Data   CBC:  Recent Labs     09/14/23  0502 09/13/23  0326 09/12/23  0440   WBC 6.25 6.66 6.09   NEUTABS 3.38 3.78 3.31   HGB 15.2 15.3 14.4   HCT 45.5 44.3 43.3   MCV 84.4 85.0 84.9   PLT 189 175 168     BMP:  Recent Labs     09/14/23  0502 09/13/23  0326 09/12/23  0440   NA 139 141 140   K 4.0 4.0 4.5   CL 102 105 106   CO2 24 23 24    BUN 11 10 13    CREAT 0.83 0.70 0.71   GLUCOSE 102* 115* 100*   CALCIUM  9.3 9.0 8.5*   MG 1.9 1.7 2.0*     LFT:  No results for input(s): ''TOTPRO'', ''ALBUMIN'', ''BILITOT'', ''BILICON'', ''ALT'', ''AST'', ''ALKPHOS'', ''GGT'', ''AMYLASE'', ''LIPASE'' in the last 72 hours.  COAGS:  Recent Labs     09/13/23  0326 09/12/23  1119 09/12/23  0440   APTT 72.7* 81.3* 69.0*     UA:  No results for input(s): ''SPECGRAVUR'', ''PHUR'', ''BLDUR'', ''KETONESUR'', ''GLUCOSEUR'', ''PROTCLUR'', ''NITRITEUR'', ''LEUKESTUR'', ''RBCSUR'', ''WBCSUR'' in the last 72 hours.    Microbiology  Recent Results (from the past week)   MRSA Surveillance, Nares    Collection Time: 09/09/23 11:45 PM    Specimen: Nares; Respiratory, Upper   Result Value Ref Range    MRSA Surveillance       No Methicillin-resistant Staphylococcus aureus isolated.        Imaging     No imaging has been resulted in the last 24 hours     Assessment/Plan   Neely Cecena is a 68 y.o. male 68 y.o. w/ h/o bicuspid AV sp TAVR 2013 (bioprosthetic) cb CVA w residual LLE weakess, presented to OSH w pre-syncope transferred for reported tachy-brady pending PPM placement.     #Tachy/SVTGLENWOOD Cleaves Arrhythmia  #pAfib  Initial fib diagnosis after valve replacement in 2013. Then no recurrence and so was not on AC. Two admissions 08/2023 second of which had recurrence of fib and managed with dilt and apix. Re-present and given IV dilt with 5 second pause. Tx for PPM.     EKG and cardiac monitoring shows episodes of SVT then followed by sinus bradycardia with sinus pauses  - Amio 200 bid x 2 weeks and then 200 every day after per EP  - heparin gtt -> will need DOAC after implant when EP deems safe   - Plan for PPM placement on 6/14 with med titration after      #Hx bicupsid AV sp surgical bioprosthetic AV (2013) :   - ASA   - will need one time eval to rule out coarct, can be done outpt  - family screening      #Hx periop CVA from AVR in 2013   Noted to have recovered LLE strength but continues to feel off balance   - ASA  - Atorvastatin  - PT/OT after PPM     #HTN:  - Amlodipine 5mg       #HLD:   - Atorvastatin     #Gout:   - Allopurinol      #Osteoporosis   #Vit D- Deficiency   - Continue on Vit Supplements      #HX of medial malleolus fracture   -CAM boot as tolerated     Inpatient Checklist:  Diet: Orders Placed This Encounter      Diet NPO Reason for NPO: Procedure    DVT Prophylaxis: Heparin Gtt  GI Prophylaxis: not indicated  Central Lines: none  Tubes/Drains: none    Disposition:     Code Status:   Full Code  Goals of Care Discussed: code status addressed    Contact:  Primary Emergency Contact: Nett Lanny Ip, Home Phone: [phone]    Author:  Elsie Chihuahua  PGY-3 Internal Medicine  [phone]  09/14/2023 8:26 AM    I have interviewed and examined the patient with the resident/fellow physician on the date of service. All labs, studies, and medications were independently reviewed by me. I have reviewed the note above and agree with the history, physical, assessment and plan which we formulated together. The patient was informed of the above plan, demonstrated understanding and was agreeable to the plan as written above.    MDM: High   Number/Complexity of Problem(s):   High: Patient with 1 or more chronic illnesses with severe exacerbation, progression, or side effects of treatment: Afib c/b sinus pauses     Amount and/or Complexity of Data Reviewed/Analyzed   [x]  Reviewed/Ordered 3 or more unique labs/tests (Cat 1)   []  Reviewed external notes from unique source.   []  Spoke with independent historian.    []   Independently interpreted studies performed by another physician/health care professional (Cat 2)   [x]  Discussed management/test interpretation with external health care professional (Cat 3) - EP     Risk of Complications/Morbidity/Mortality of Patient Management   High risk of complication and/or morbidity associated with patient management, due to drug therapy requiring intensive monitoring for toxicity: amiodarone.    Zeke LOIS Kearns, MD  General Cardiology  Advanced Heart Failure and Transplant Cardiology   [facility]

## 2023-09-14 NOTE — Progress Notes
 .  CCU Progress Note      PMD: Andrew Francisco, MD  DATE OF SERVICE: 09/15/2023  [name] DAY: 6  CHIEF COMPLAINT: No chief complaint on file.      ID: Andrew [name] is a 68 y.o. male 68 y.o. w/ h/o bicuspid AV sp TAVR 2013 (bioprosthetic) cb CVA w residual LLE weakess, presented to OSH w pre-syncope transferred for reported tachy-brady pending PPM placement.     Last 24 Hour/Overnight Events   ON: NAEON     Subjective/Review of Systems   AM: Concerned that he cannot get around with help from his wife.    Noted to have 6 runs of non-sustained vtach on tele.    14-point ROS negative except as outlined above.    Medications     Scheduled:  allopurinol, 200 mg, Oral, Daily  amiodarone, 200 mg, Oral, BID  amLODIPine, 5 mg, Oral, Daily  ascorbic acid, 500 mg, Oral, Daily with breakfast  aspirin, 81 mg, Oral, Daily  atorvastatin, 20 mg, Oral, Nightly  calcium carbonate 1250 mg (500 mg elemental), 1,250 mg, Oral, Daily with breakfast  cholecalciferol, 25 mcg, Oral, Daily   Continuous:       PRN:  polyethylene glycol, senna      Physical Exam   Temp:  [36.2 ?C (97.2 ?F)-36.6 ?C (97.9 ?F)] 36.2 ?C (97.2 ?F)  Heart Rate:  [77-89] 88  Resp:  [16-18] 17  BP: (102-131)/(77-95) 121/87  NBP Mean:  [87-106] 98  SpO2:  [91 %-99 %] 97 %   UOP: Urine:  [50 mL-250 mL] 250 mL I/Os:   Intake/Output Summary (Last 24 hours) at 09/15/2023 1150  Last data filed at 09/15/2023 1141  Gross per 24 hour   Intake 1460 ml   Output 1150 ml   Net 310 ml     Gen: Well-developed male in no acute distress  Eyes: PERRL, EOMI  ENT: OP clear, MMM  Neck: Supple, trachea midline, no cervical or supraclavicular lymphadenopathy  CV: Intermittently tachy then bradycardic   Pulm: CTAB, no wheezes/rales/rhonchi, normal work of breathing  Abd: NABS, soft, flat, NT, no masses or organomegaly  Skin: Warm and dry, no rashes  Ext: No cyanosis or edema. Distal pulses 2+ and symmetric  Neuro: A&Ox4, CNII-XII intact, 4/5 strength in LLE, 5/5 strength in all other ext, sensation grossly intact    Laboratory Data   CBC:  Recent Labs     09/14/23  0502 09/13/23  0326   WBC 6.25 6.66   NEUTABS 3.38 3.78   HGB 15.2 15.3   HCT 45.5 44.3   MCV 84.4 85.0   PLT 189 175     BMP:  Recent Labs     09/14/23  0502 09/13/23  0326   NA 139 141   K 4.0 4.0   CL 102 105   CO2 24 23   BUN 11 10   CREAT 0.83 0.70   GLUCOSE 102* 115*   CALCIUM 9.3 9.0   MG 1.9 1.7     LFT:  No results for input(s): ''TOTPRO'', ''ALBUMIN'', ''BILITOT'', ''BILICON'', ''ALT'', ''AST'', ''ALKPHOS'', ''GGT'', ''AMYLASE'', ''LIPASE'' in the last 72 hours.  COAGS:  Recent Labs     09/13/23  0326   APTT 72.7*     UA:  No results for input(s): ''SPECGRAVUR'', ''PHUR'', ''BLDUR'', ''KETONESUR'', ''GLUCOSEUR'', ''PROTCLUR'', ''NITRITEUR'', ''LEUKESTUR'', ''RBCSUR'', ''WBCSUR'' in the last 72 hours.    Microbiology     Recent Results (from the past week)   MRSA Surveillance, Nares  Collection Time: 09/09/23 11:45 PM    Specimen: Nares; Respiratory, Upper   Result Value Ref Range    MRSA Surveillance       No Methicillin-resistant Staphylococcus aureus isolated.        Imaging     Cath Pacemaker ICD  Result Date: 09/14/2023  See Procedure tab in Chart Review for full report.        Assessment/Plan   Andrew [name] is a 68 y.o. male 68 y.o. w/ h/o bicuspid AV sp TAVR 2013 (bioprosthetic) cb CVA w residual LLE weakess, presented to OSH w pre-syncope transferred for reported tachy-brady pending PPM placement.     #Tachy/SVTGLENWOOD [name] Arrhythmia  #pAfib  Initial fib diagnosis after valve replacement in 2013. Then no recurrence and so was not on AC. Two admissions 08/2023 second of which had recurrence of fib and managed with dilt and apix. Re-present and given IV dilt with 5 second pause. Tx for PPM.      EKG and cardiac monitoring shows episodes of SVT then followed by sinus bradycardia with sinus pauses  - Amio 200 bid x 2 weeks and then 200 every day after per EP  - heparin gtt -> will need DOAC starting 6/16 AM per EP  - Plan for PPM placement on 6/14 with med titration after   - follow up echo     #Hx bicupsid AV sp surgical bioprosthetic AV (2013) :   - ASA   - will need one time eval to rule out coarct, can be done outpt  - family screening      #Hx periop CVA from AVR in 2013   Noted to have recovered LLE strength but continues to feel off balance   - ASA  - Atorvastatin  - PT/OT after PPM    #discharge planning  - PT/OT to assess home mobility needs     #HTN:  - Amlodipine 5mg       #HLD:   - Atorvastatin     #Gout:   - Allopurinol      #Osteoporosis   #Vit D- Deficiency   - Continue on Vit Supplements      #HX of medial malleolus fracture   -CAM boot as tolerated     Inpatient Checklist:  Diet: Orders Placed This Encounter      Diet regular    DVT Prophylaxis: Heparin Gtt  GI Prophylaxis: not indicated  Central Lines: none  Tubes/Drains: none    Disposition:     Code Status:   Full Code  Goals of Care Discussed: code status addressed    Contact:  Primary Emergency Contact: Andrew [name], Home Phone: [phone]    Discussed with attending, Dr. Kenn.     Andrew Blower, MD 09/15/2023 11:50 AM  PGY-1, [name]    I have interviewed and examined the patient with the APP on the date of service. All labs, studies, and medications were independently reviewed by me. I have reviewed the note above and agree with the history, physical, assessment and plan which we formulated together. The patient was informed of the above plan, demonstrated understanding and was agreeable to the plan as written above.    MDM: High   Number/Complexity of Problem(s):   High: Patient with 1 or more chronic illnesses with severe exacerbation, progression, or side effects of treatment: atrial fibrillation     Amount and/or Complexity of Data Reviewed/Analyzed   [x]  Reviewed/Ordered 3 or more unique labs/tests (Cat 1)   []   Reviewed external notes from unique source.   []  Spoke with independent historian.    [x]  Independently interpreted studies performed by another physician/health care professional (Cat 2)   []  Discussed management/test interpretation with external health care professional (Cat 3)     Risk of Complications/Morbidity/Mortality of Patient Management   High risk of complication and/or morbidity associated with patient management, due to drug therapy requiring intensive monitoring for toxicity: Amiodarone.    Zeke LOIS Kearns, MD  General Cardiology  Advanced Heart Failure and Transplant Cardiology   [facility]

## 2023-09-14 NOTE — Op Note
 Dual Chamber Permanent Pacemaker Implantation    Date of Procedure: 09/14/2023    PATIENT: Andrew [name]  MRN: [id number]  DOB: [birth year]    REFERRING PRACTITIONER: Zhao, Evan, DO  PRIMARY CARE PROVIDER: Jeana Francisco, MD     ATTENDING PHYSICIAN: Eric Melnick  EP FELLOW: Ozell Agent, MD    Pre-Procedure Diagnosis:    Tachy-brady syndrome  Post-Procedure Diagnosis:   Same. S/p Successful implantation of a dual chamber pacemaker.       Procedures:   Dual chamber permanent pacemaker implantation  Fluoroscopy utilization and interpretation  Pacemaker interrogation and programming   Ultrasound-guided vascular access    Anesthesia: MAC per Anesthesia Service; Lidocaine, 1% SQ.              Surgical Prophylaxis: Cefazolin 2gm + Vancomycin 1 gr was administered intravenously prior to the start of the procedure.      CLINICAL HISTORY:  Teller Wakefield is a 68 y.o. male with history of bicuspid aortic valve stenosis s/p surgical bioprosthetic AVR 04/2011, prior TIA/stroke with residual left-sided and RLE weakness, paroxysmal atrial fibrillation with tachy-brady syndrome. After a discussion regarding options in a shared decision-making model, they now present for dual chamber pacemaker implantation.     DOCUMENTATION OF INFORMED CONSENT:  Potential risks associated with permanent pacemaker implantation were explained to the patient. Such risks include, but are not limited to: cardiac tamponade, cardiac perforation, pneumothorax, bleeding requiring transfusion, death, elevated thresholds, erosion through the skin, fibrotic tissue formation, infection, lead dislodgment, lead fracture, local tissue reaction, muscle and nerve stimulation, myopotential sensing, pacemaker migration, transvenous lead-related thrombosis, random component or battery failures that cannot be predicted and can lead to failure to deliver appropriate therapy. Alternatives to the proposed procedure were also explained to the patient. The patient is cognizant of these risks and signed the informed written consent.     PROCEDURE DETAILS:  Patient was prepped and draped in the usual strict sterile fashion. Local anesthetic was infiltrated medial to the left deltopectoral groove. Access to the axillary vein was obtained percutaneously using ultrasound guidance. Just inferior to the wire, an incision was made with a scalpel and extended to the prepectoral fascia using electrical cautery. The wire was internalized into the pocket and two additional guidewires were retained via a sheath. A pacemaker lead was advanced to the heart under fluoroscopic guidance through a sheath. The lead was fixated to the right ventricular septum. Appropriate sensing and thresholds were obtained. No diaphragmatic pacing occurred with high output pacing. The sheath was peeled away, slack was checked, and the lead was tied down to the prepectoral fascia with 2-0 Ethibond. A second sheath was advanced over a guidewire. A pacemaker lead was advanced to the heart under fluoroscopic guidance. The lead was fixated to the right atrial appendage. Appropriate sensing and thresholds were obtained and diaphragmatic capture was ruled out. The sheath was peeled away, slack was checked, and the lead was tied down to the prepectoral fascia with 2-0 Ethibond.    The leads were attached to the generator. The pocket was flushed with vancomycin-containing solution. The system was placed in the pocket and the pocket was again irrigated. The pacemaker and lead system were visualized under fluoroscopy.  Hemostasis was reverified. A Tyrx antibiotic pouch was placed.    2-0 Vicryl was used for fascial closure, 3-0 Vicryl was used for subcutaneous closure, and 4-0 Vicryl was used for subcuticular closure. The incision was then protected with liquid adhesive and opsite clear adhesive dressing.  Estimated Blood Loss: Minimal  Fluoroscopy Data: 3:50 minutes, 12 mGy, 306 uGy*m^2  Contrast: 0 mL  Complications: None immediately apparent    IMPLANTED MATERIALS, PARAMETERS, MEASUREMENTS:      FINAL DIAGNOSES:   Tachy-brady syndrome  Successful implantation of a dual chamber pacemaker.        PLAN:   PA/LAT CXR to assess for satisfactory lead position and ensure no complications  No anticoagulation including subcutaneous heparin or enoxaparin for at least 48 hours  Okay to resume apixaban in 48 hours on morning of 09/16/2023  Follow-up in 1-2 weeks for wound check (we will request)  Routine pacemaker interrogation in 3 months.  If CXR is satisfactory, ok for same day discharge from EP standpoint    Ozell Agent, MD  Clinical Cardiac Electrophysiology Fellow  [facility]    I have read and edited the note above and agree with reported findings.   I was scrubbed in and present for the entire procedure.     Eric Melnick, MD  Attending Physician, Cardiac Electrophysiology

## 2023-09-14 NOTE — Consults
 Inpatient Cardiac Electrophysiology Consultation    DATE OF SERVICE: 09/14/2023  PATIENT: Andrew [name]   MRN: [id number]  DOB: Apr 17, 1955    REFERRING PHYSICIAN: Vucicevic, Darko, MD    REASON FOR CONSULTATION: History of syncope, rapid atrial fibrillation, and sinus bradycardia    33M admitted for further management of rapid atrial fibrillation     MEDICAL PROBLEMS  Paroxysmal atrial fibrillation (CHADSVASc score = 4 due to age, stroke, and hypertension)  Anticoagulation: apixaban 5mg  twice daily  Rate control: diltiazem and previously metoprolol  History of bicuspid aortic valve stenosis  Status post surgical, bioprosthetic aortic valve replacement January 2013 [facility] Ease #25; Dr. Talitha; [facility])  Postoperative stroke, with residual left-sided and right lower extremity weakness    HISTORY OF PRESENT ILLNESS:    Index diagnosis of arrhythmia was that of paroxysmal atrial fibrillation following elective surgical bioprosthetic aortic valve replacement at [facility] in 2013; he did not have recurrence following this isolated episode and thus was not managed with antiarrhythmic nor anticoagulation regimen.     He presented to the hospital twice more in May 2025, once for treatment of dyspnea and suspected heart failure, and again for palpitation, where he was diagnosed with recurrence of atrial fibrillation and rapid ventricular rates; this was managed with diltiazem and apixaban. As he re-presented for palpitation and dizziness on Aug 31, 2023 to [facility]) he was treated with IV diltiazem and found to have subsequent pauses up to 5 seconds' duration. He does not recall symptoms, but rather than he was given a diagnosis of tachy-brady syndrome and recommended to undergo pacemaker placement.     Initiated on amiodarone 09/10/2023 for treatment of atrial arrhythmia; subsequently had recurrence of SVT (likely AT) with symptomatic conversion pause.     6/14: plan for PPM implantation today    PAST MEDICAL HISTORY:  Past Medical History:   Diagnosis Date    CVA (cerebral vascular accident) (HCC/RAF)     H/O aortic valve replacement     Kidney stone     Urinary frequency        PAST SURGICAL HISTORY:  Past Surgical History:   Procedure Laterality Date    AORTIC VALVE REPLACEMENT         FAMILY HISTORY:  Family History   Problem Relation Age of Onset    No Known Problems Mother     Other (gastric ulcer) Father     Uterine cancer Sister     Stroke Brother     Kidney failure Brother     Colon cancer Neg Hx     Lung cancer Neg Hx     Prostate cancer Neg Hx        SOCIAL HISTORY:  Social History     Socioeconomic History    Marital status: Married   Tobacco Use    Smoking status: Former     Current packs/day: 0.00     Average packs/day: 1 pack/day for 15.0 years (15.0 ttl pk-yrs)     Types: Cigarettes     Start date: 05/03/1975     Quit date: 05/02/1990     Years since quitting: 33.3    Smokeless tobacco: Never   Substance and Sexual Activity    Alcohol use: Not Currently     Comment: occasionally has red wine    Drug use: Not Currently     Social Drivers of Health     Financial Resource Strain: Low Risk  (09/10/2023)  Financial Resource Strain     Difficulty of Paying Living Expenses: Not very hard       OUTPATIENT MEDICATIONS:  Current Facility-Administered Medications   Medication Dose Route Frequency    allopurinol tab 200 mg  200 mg Oral Daily    [COMPLETED] amiodarone 150 mg in dextrose 100 mL IVPB RTU 150 mg  150 mg Intravenous Once    [COMPLETED] amiodarone 150 mg in dextrose 100 mL IVPB RTU 150 mg  150 mg Intravenous Once    [COMPLETED] amiodarone 150 mg in dextrose 100 mL IVPB RTU 150 mg  150 mg Intravenous Once    amiodarone tab 200 mg  200 mg Oral BID    amLODIPine tab 5 mg  5 mg Oral Daily    ascorbic acid tab 500 mg  500 mg Oral Daily with breakfast    aspirin chew tab 81 mg  81 mg Oral Daily    atorvastatin tab 20 mg  20 mg Oral Nightly    [COMPLETED] lactated ringers IV soln bolus 1,000 mL  1,000 mL Intravenous Once    [COMPLETED] lactated ringers IV soln bolus 500 mL  500 mL Intravenous Once    [COMPLETED] magnesium sulfate 2 g in water for injection 50 mL RTU  2 g Intravenous Once    [COMPLETED] magnesium sulfate 2 g in water for injection 50 mL RTU  2 g Intravenous Once    [COMPLETED] magnesium sulfate 4 g in water for injection 100 mL RTU  4 g Intravenous Once    [COMPLETED] magnesium sulfate 4 g in water for injection 100 mL RTU  4 g Intravenous Once    magnesium sulfate 4 g in water for injection 100 mL RTU  4 g Intravenous Once    polyethylene glycol pwd pkt 17 g  17 g Oral Daily PRN    [COMPLETED] potassium chloride CR tab 20 mEq  20 mEq Oral Once    [COMPLETED] potassium chloride CR tab 40 mEq  40 mEq Oral Once    [COMPLETED] potassium chloride ER tab 20 mEq  20 mEq Oral Once    senna tab 1 tablet  1 tablet Oral QHS PRN    [COMPLETED] sodium chloride 0.9% IV soln        vitamin D (cholecalciferol) tab 25 mcg  25 mcg Oral Daily    [DISCONTINUED] amiodarone 360 mg in dextrose 200 mL drip RTU (peripheral)  1 mg/min Intravenous Continuous    [DISCONTINUED] amiodarone 360 mg in dextrose 200 mL drip RTU (peripheral)  1 mg/min Intravenous Continuous    [DISCONTINUED] amiodarone 360 mg in dextrose 200 mL drip RTU (peripheral)  0.5 mg/min Intravenous Continuous    [DISCONTINUED] amiodarone 360 mg in dextrose 200 mL drip RTU (peripheral)  0.5 mg/min Intravenous Continuous    [DISCONTINUED] apixaban tab 5 mg  5 mg Oral BID    [DISCONTINUED] atorvastatin tab 20 mg  20 mg Oral Nightly    [DISCONTINUED] heparin 25,000 units in 0.45% NaCl 250 mL drip RTU  910 Units/hr Intravenous Continuous    [DISCONTINUED] heparin 25,000 units in 0.45% NaCl 250 mL drip RTU  910 Units/hr Intravenous Continuous    [DISCONTINUED] heparin per pharmacy   Does not apply Continuous    [DISCONTINUED] metoprolol tartrate tab 12.5 mg  12.5 mg Oral BID    [DISCONTINUED] Patient Transfer (Notification to Pharmacy)-REQUIRED   Does not apply Pharmacy Communication    [DISCONTINUED] polyethylene glycol pwd pkt 17 g  17 g Oral Daily  ALLERGIES:  No Known Allergies    INPATIENT SCHEDULED MEDICATIONS:   allopurinol  200 mg Oral Daily    amiodarone  200 mg Oral BID    amLODIPine  5 mg Oral Daily    ascorbic acid  500 mg Oral Daily with breakfast    aspirin  81 mg Oral Daily    atorvastatin  20 mg Oral Nightly    magnesium sulfate IV  4 g Intravenous Once    cholecalciferol  25 mcg Oral Daily         PHYSICAL EXAMINATION:  VITALS: BP 145/114  ~ Pulse 79  ~ Temp 36.3 ?C (97.3 ?F) (Oral)  ~ Resp 19  ~ Wt 195 lb 15.8 oz (88.9 kg)  ~ SpO2 96%  ~ BMI 31.63 kg/m?    Temp:  [36 ?C (96.8 ?F)-36.6 ?C (97.9 ?F)] 36.3 ?C (97.3 ?F)  Heart Rate:  [77-84] 79  Resp:  [17-19] 19  BP: (129-145)/(95-114) 145/114  NBP Mean:  [107-125] 125  SpO2:  [92 %-97 %] 96 %        Intake/Output Summary (Last 24 hours) at 09/14/2023 0951  Last data filed at 09/14/2023 0417  Gross per 24 hour   Intake 347.32 ml   Output 1125 ml   Net -777.68 ml       General: well developed well nourished in no acute distress  Eyes: extraocular movements intact, anicteric.  ENT: oropharynx clear, moist mucous membranes  Neck: JVP not assessed. No lymphadenopathy  CV: Regular rhythm by peripheral exam  Resp: Good air entry throughout all fields bilaterally, no wheezes or crackles.  GI: abd soft, non tender, non distended, normoactive bowel sounds  MSK: extremities without clubbing or cyanosis.  No significant edema  Derm: warm, dry , and well perfused  Neuro: grossly non focal      LABORATORY:  Lab Results   Component Value Date    WBC 6.25 09/14/2023    HGB 15.2 09/14/2023    HCT 45.5 09/14/2023    MCV 84.4 09/14/2023    PLT 189 09/14/2023    Lab Results   Component Value Date    K 4.0 09/14/2023    MG 1.9 09/14/2023    CREAT 0.83 09/14/2023    Lab Results   Component Value Date    ALT 109 (H) 09/10/2023    AST 35 09/10/2023    ALKPHOS 75 09/10/2023    BILITOT 0.5 09/10/2023      Lab Results   Component Value Date    INR 1.0 09/10/2023    APTT 72.7 (H) 09/13/2023    Lab Results   Component Value Date    TSH 4.1 09/10/2023    Lab Results   Component Value Date    HGBA1C 6.4 (H) 05/25/2023     Lab Results   Component Value Date    TROPONIN 22 (H) 09/11/2023         DIAGNOSTIC STUDIES:    Telemetry 09/11/2023: intermittent runs of rapid SVT, probable atrial tachycardia (long RP interval) with conversion pause, associated with symptoms:          12-lead ECG 09/09/2023: sinus rhythm at 70 bpm with PR interval and QRS duration 130-163ms:         Prior ECGs dating back to 2022 demonstrated sinus rhythm at 74 bpm with PR interval and QRS duration in a right bundloid pattern:        TTE 08/08/2023: LV EF 60-65% with normal LV  size and moderate concentric LVH.       Outside records and tracings from [facility] May 2025:     08/23/2023 ECG demonstrating sinus with premature atrial complexes:      08/23/2023: sinus rhythm with initiation of narrow complex tachycardia, irregular, most likely atrial fibrillation:       Telemetry report 08/23/2023 demonstrating SVT with pauses, likely conversion:          ASSESSMENT:    30M admitted for further management of rapid atrial fibrillation     MEDICAL PROBLEMS    Paroxysmal atrial fibrillation (CHADSVASc score = 4 due to age, stroke, and hypertension)  Anticoagulation: apixaban 5mg  twice daily  Rate control: diltiazem and previously metoprolol  History of bicuspid aortic valve stenosis  Status post surgical, bioprosthetic aortic valve replacement January 2013 [facility] Ease #25; Dr. Talitha; [facility])  Postoperative stroke, with residual left-sided and right lower extremity weakness    DISCUSSION:    Cardiac Electrophysiology was consulted for management of paroxysmal atrial fibrillation with rapid ventricular rates, and reported pauses noted on telemetry.    Review of telemetry data from [facility] appears to corroborate presence of sinus pauses, either due to sinus node dysfunction or in the context of conversion from bouts of atrial tachycardia.     Due to occurrence of symptomatic pauses during treatment of atrial arrhythmia with amiodarone (09/11/2023), recommended pacemaker implantation - discussed the benefits, risks, and indications associated with this with patient and wife, who elected to proceed. All questions were answered and informed consent completed 09/11/2023.     RECOMMENDATIONS:    Maintain on telemetry while inpatient.   Continue amiodarone, 200mg  twice daily for two weeks, followed by 200mg  daily thereafter.   Maintain bilateral, functional upper extremity peripheral IV catheters.   Will plan for add-on inpatient pacemaker implantation today, pending coordination of Cardiac Anesthesia availability.   If CXR post implant with satisfactory lead position and no complications arise, anticipate would be ready for discharge later today from EP standpoint. Could resume anticoagulation in 48 hours post-implant (advise earlier than 72 hours on 6/16 given his prior stroke history)    Ozell Agent, MD  Clinical Cardiac Electrophysiology Fellow  [facility] Cardiac [facility]rhythmia [facility]    I have discussed, seen and examined the patient with the Cardiac Electrophysiology Consult Team. I agree with the above Assessment and Plan which I helped formulate.     Current plan of care was discussed in detail with the patient/family, all questions were answered.    A total of 52 minutes of care time were dedicated to today's visit with greater than 50% dedicated to counseling and discussing plan of care with his wife before and after the pacemaker implant    Additional time was used for pre visit review of EMR (including but not limited to other provider notes, procedure results, test results, imaging and others) post visit documentation, coordination of care and others. Eric Melnick, MD  Attending Physician   Cardiac Electrophysiolgy

## 2023-09-14 NOTE — Other
 [name] Clinical Goal:   Clinical Goal(s) for the Shift: VSS, stable cardiac rhythm, monitor for bleeding, maintain safety and comfort  Identify possible barriers to advancing the care plan: None  Stability of the patient: Moderately Unstable - medium risk of patient condition declining or worsening    Progression of [name] Clinical Goal: Pt. AxO4, VSS, NSR on monitor, on RA. No complaints of SOB?CP/n/v. Pt. Had very small episode of arrhythmia overnight, MD was made aware, pt. Remained asymptomatic. Pt. Was on heparin drip at 910 units/hr until 0000 6/14 per MD order, for planned Pacemaker. Pt. NPO, voiding well, no complaints. Comfort and safety maintained.     Plan: Pacemaker

## 2023-09-14 NOTE — Discharge Instructions
 .Discharge Instructions for Patients with  Pacemaker (PM)    Dear [name],    You have just had a procedure in which a pacemaker device was placed in your chest to help regulate your heart rate and rhythm. We will be working with you to assist you as you make the adjustment to living with a pacemaker.  The following information is provided to help answer any post- procedure questions you may have.    Medications:  You may resume your blood thinner, the eliquis , on morning of 09/16/2023.    Follow-up Appointments  We will arrange for an incision check for you in the next 2 weeks. Please expect a call to schedule sometime this coming week. If you do not hear from us , please call the number below to schedule.  Location: [facility], [address], Room 690  Pacemaker Clinic - Please call [phone] within the next two weeks to schedule an appointment for PM interrogation in 3 months.  Location: [facility], Building [address], Room 690    Always carry with you the pacemaker Identification Card.  You will receive a temporary card before you go home from the hospital and the manufacturer will send you a permanent identification card within 6-8 weeks after your surgery.  The identification card contains important information about your PM.  If you lose your card, contact the PM Clinic at [phone] for assistance in contacting the manufacturer for a new one.     Incision Care  Keep the incision covered with the gauze dressing until your scheduled follow-up appointment.  Do not remove the gauze dressing at home - we will remove it during your clinic visit.   It is essential to keep the dressing over the incision dry.  We recommend that you use sponge baths from the waist up until your follow up appointment or if you only have a shower, please make sure to cover the dressing with plastic wrap so it does not get wet.    DANGER SIGNALS TO WATCH FOR AT HOME  Call your implanting physician if you experience any of the following:  Fever (greater than 101 degrees Fahrenheit or greater than 38.5 degrees Celsius)  Chills  Signs of poor wound healing (ex: increasing redness, heat, swelling, oozing, bleeding, open incision)  Chest pain or discomfort that is different from the incision pain  Numbness, increasing pain, or swelling of the arm on the side the PM is implanted    Pain  Observe accurately the site and characteristics of the pain you are experiencing. Incision pain is usually localized to the incision site.  Tylenol  may be taken for incision pain.    When Can You Resume Your Usual Activities  Activities: Do not lift your arm on the side that the PM is implanted higher than the level of your shoulder for two weeks.  This decreases your risk of dislodging the lead.  Caution with contact sports (may damage battery box).  Delay weight lifting, golf, tennis, swimming, and throwing a baseball for 6 weeks until the leads have time to fixate into the heart muscle.  Walking: Initially, walk three times a day, increasing distance as tolerated.   Sleeping: Avoid sleeping on the same side as the PM while the incision is healing.  Sexual Activity: Ok to resume sexual activity while avoiding pressure on the PM.    Precautions  Magnetic Fields:  Many things in the environment contain magnets.  The PM paces at a fixed rate  in the presence of a magnet.  It returns to normal operation when you distance yourself from the magnet.  Walk normally through airport detectors. The metal in the PM will set off the alarm.  Show security your PM identification card.  It is preferred that security personnel do not use the wand close to your PM and instead ask for them to hand search.    Avoid leaning over running engines on cars and lawnmowers.  Do not lift stereo speakers (large magnets inside)  Avoid areas of arc welding  Contact your physician before using the following:  Electrocautery (used during surgery)  TENs units (for pain control)  Diathermy or other physical therapy treatments  MRI (x-ray equipment)  Radiation treatments  Any surgical procedure  Dental procedures where electrocautery will be used  Certain types of x-ray therapies (ask if you are not sure)    Travel  Travel is OK but before you go check with your physician and the PM clinic personnel.  Take an adequate supply of your routine medication with you.    Emergency Procedures  If a PM should fail to function properly, you may experience similar symptoms that occurred before the PM was implanted.  If any of these previous symptoms should occur, please call your cardiologist.     We hope you found this information helpful.  If you have any further questions please feel free to contact us  at [phone].           Sincerely,     [facility] Cardiac Arrhythmia [facility] Faculty and Staff       A NOTE FROM YOUR DOCTOR    You were admitted for placement of a pacemaker (see above for instructions on how to care for this).    Please make sure you see the cardiologist as scheduled on 6/24 to follow-up on your pacemaker. Leave the dressing on until you can be seen in clinic.  Please continue all of your heart medications that were started in the hospital and prescribed to you, including metoprolol  and amiodarone . Continue Eliquis  as well.  You experienced a bleed from a stomach ulcer which was noted on an endoscopy. It has stopped bleeding since then. If you notice more blood in your stool or black stools or you experience dizziness, shortness of breath, chest pain, or other concerning signs or symptoms, come back to the ER immediately.  You will need to follow-up with a GI doctor in 4 weeks to discuss a repeat endoscopy.   Please follow-up with your neurologist to discuss whether you should continue aspirin  long-term or if you can stop.    These follow up visits have already been scheduled:  Future Appointments   Date Time Provider Department Center   09/23/2023  3:30 PM Andrew Francisco, MD IMRES [id number] [place]/Cen   09/24/2023  3:20 PM Andrew [name] SQUIBB., NP CRD EP 690 [place]/Cen   10/09/2023  3:00 PM Di Cater, MD CRD CSCS [address] [place]/Cen   10/16/2023  1:30 PM ARS, MP1 CRD DEVICE [place]/Cen   10/16/2023  2:00 PM Myrle Fines, MD CRD EP 690 /Cen       It is important that you follow up with your doctor(s) in the coming weeks. We have schedulers who will help you make these appointments.  - If you do not hear from them or need additional assistance, please call our referral service at [phone] [phone]) or go to [email or web address]    If you feel like your illness/symptoms are worsening  and need urgent evaluation (i.e. you cannot wait until your follow up visits or your primary care physician cannot see you):   - You can get same day medical attention at an urgent care (find a [place] urgent care office at [email or web address])  - If you need more serious immediate care, please return to the ER.      Thank you,  [facility]

## 2023-09-14 NOTE — H&P
 UPDATED H&P REQUIREMENT    WHAT IS THE STATUS OF THE [name] MOST CURRENT HISTORY AND PHYSICAL?   - The most current H&P is >24 hours and <30 days, and having examined the patient, I attest that there have been no changes. (This suffices as an update to the H&P).      REFER TO MEDICAL STAFF POLICIES REGARDING PRE-PROCEDURE HISTORY AND PHYSICAL EXAMINATION AND UPDATED H&P REQUIREMENTS BELOW:    [facility] and [facility] Medical Staff Policy 200 - For Patients Undergoing Procedures Requiring Moderate or Deep Sedation, General Anesthesia or Regional Anesthesia    Contents of a History and Physical Examination (H&P):    The H&P shall consist of chief complaint, history of present illness, allergies and medications, relevant social and family history, past medical history, review of systems and physical examination, and assessment and plan appropriate to the patient's age.    For Patients Undergoing Procedures Requiring Moderate or Deep Sedation, General Anesthesia or Regional Anesthesia:    1. An H&P shall be performed within 24 hours prior to the procedure by a qualified member of the medical staff or designee with appropriate privileges, except as noted in item 2 below.    2. If a complete history and physical was performed within thirty (30) calendar days prior to the patient?s admission to the Medical Center for elective surgery, a member of the medical staff assumes the responsibility for the accuracy of the clinical information and will need to document in the medical record within twenty-four (24) hours of admission and prior to surgery or major invasive procedure, that they either attest that the history and physical has been reviewed and accepted, or document an update of the original history and physical relevant to the patient's current clinical status.    3. Providing an H&P for patients undergoing surgery under local anesthesia is at the discretion of the Attending Physician.     4. When a procedure is performed by a dentist, podiatrist or other practitioner who is not privileged to perform an H&P, the anesthesiologist?s assessment immediately prior to the procedure will constitute the 24 hour re-assessment.The dentist, podiatrist or other practitioner who is not privileged to perform an H&P will document the history and physical relevant to the procedure.    5. If the H&P and the written informed consent for the surgery or procedure are not recorded in the patient's medical record prior to surgery, the operation shall not be performed unless the attending physician states in writing that such a delay could lead to an adverse event or irreversible damage to the patient.    6. The above requirements shall not preclude the rendering of emergency medical or surgical care to a patient in dire circumstances.

## 2023-09-15 MED ADMIN — AMLODIPINE BESYLATE 5 MG PO TABS: 5 mg | ORAL | @ 15:00:00 | Stop: 2023-09-17 | NDC 00904637061

## 2023-09-15 MED ADMIN — OYSTER CALCIUM 1250 (500 CA) MG PO TABS: 1250 mg | ORAL | @ 15:00:00 | Stop: 2023-09-22 | NDC 00904188361

## 2023-09-15 MED ADMIN — ALLOPURINOL 100 MG PO TABS: 200 mg | ORAL | @ 15:00:00 | Stop: 2023-09-22 | NDC 60687067711

## 2023-09-15 MED ADMIN — AMIODARONE HCL 200 MG PO TABS: 200 mg | ORAL | @ 15:00:00 | Stop: 2023-09-19 | NDC 60687043711

## 2023-09-15 MED ADMIN — CHOLECALCIFEROL 25 MCG (1000 UT) PO TABS: 25 ug | ORAL | @ 15:00:00 | Stop: 2023-09-22

## 2023-09-15 MED ADMIN — AMIODARONE HCL 200 MG PO TABS: 200 mg | ORAL | @ 04:00:00 | Stop: 2023-09-19 | NDC 60687043711

## 2023-09-15 MED ADMIN — POLYETHYLENE GLYCOL 3350 17 G PO PACK: 17 g | ORAL | @ 17:00:00 | Stop: 2023-09-17 | NDC 60687043199

## 2023-09-15 MED ADMIN — ATORVASTATIN CALCIUM 20 MG PO TABS: 20 mg | ORAL | @ 04:00:00 | Stop: 2023-09-22 | NDC 68084009811

## 2023-09-15 MED ADMIN — ASCORBIC ACID 500 MG PO TABS: 500 mg | ORAL | @ 15:00:00 | Stop: 2023-09-22 | NDC 00904052361

## 2023-09-15 MED ADMIN — ASPIRIN 81 MG PO CHEW: 81 mg | ORAL | @ 15:00:00 | Stop: 2023-09-17 | NDC 66553000201

## 2023-09-15 NOTE — Other
 [name] Clinical Goal:   Clinical Goal(s) for the Shift: VSS, stable cardiac rhythm, no c/p of cp/sob/n/v/pain, monitor new ICD site, replace lytes prn, maintain comfort and safety  Identify possible barriers to advancing the care plan: None  Stability of the patient: Moderately Unstable - medium risk of patient condition declining or worsening    Progression of [name] Clinical Goal: A&Ox4, NSR, HR 70-80s, SBP 110-120s, DBP 90s, 2L NC while sleeping, RA while awake, no c/p of cp/sob/n/v/pain, PPM dressing c/d/I, no lytes replaced, maintained comfort and safety     Plan: D/C home

## 2023-09-15 NOTE — Consults
 IP CM ACTIVE DISCHARGE PLANNING  Department of Care Coordination      Admit Ijuz:[id number]  Anticipated Date of Discharge: 09/18/2023    Following FI:Dmpcjdujcj, Zeke K.*      Today's short update     DCP: Home when medically stable.    Disposition     Home  Pending plan  [address]  [place] [address]  Family/Support System in agreement with the current discharge plan: No, they are not in agreement (Comment on Barrier)    Freedom of Choice       Multidisciplinary Team Member Plan of Care   Interdisciplinary rounds were conducted with the multidisciplinary team including the clinical social worker and nurse case manager. The patient's plan of care and discharge plan were discussed and formulated based on the patient's specific needs.    Home Health Coordination Status (if applicable)     Order pending (1/7)      Non-medical Transportation Arrangement Status (if applicable)     Patient/family secured        Other Arrangements (if applicable)          Gustave Chandra Pin, RN, BSN  [place] Clinical Case Manager  [facility] Department of Care Coordination & Clinical Social Work  (T) [phone], ext: [phone]  (F) [phone]  WEEKEND CARE COORDINATION PAGER [phone]  WEEKEND CLINICAL SOCIAL WORK PAGER [phone]  WEEKEND  CM ED [phone]  WEEKDAY CM ED PAGER [phone]

## 2023-09-15 NOTE — Other
 [name] Clinical Goal:   Clinical Goal(s) for the Shift: safety comfort  Identify possible barriers to advancing the care plan:   Stability of the patient: Moderately Unstable - medium risk of patient condition declining or worsening    Progression of [name] Clinical Goal: remained sinus 70s to 80s no ectopy noted sbp stable saturating high 90s assisted up on the chair by lift team pt appeared to be very weak on transfer Md aware and was at the bedside spoke with wife and patient and suggested to the MD pt needs physical and occupational therapy  safety comfort ice applied to pacemaker site plan pending PT/OT eval and ECHO

## 2023-09-16 ENCOUNTER — Other Ambulatory Visit: Payer: Commercial Managed Care - Pharmacy Benefit Manager

## 2023-09-16 ENCOUNTER — Ambulatory Visit: Payer: Commercial Managed Care - HMO

## 2023-09-16 LAB — Comprehensive Metabolic Panel
ALBUMIN: 3.9 g/dL (ref 3.9–5.0)
ANION GAP: 9 mmol/L (ref 8–19)

## 2023-09-16 LAB — Magnesium: MAGNESIUM: 1.7 meq/L (ref 1.4–1.9)

## 2023-09-16 LAB — Basic Metabolic Panel
CHLORIDE: 102 mmol/L (ref 96–106)
UREA NITROGEN: 17 mg/dL (ref 7–22)

## 2023-09-16 LAB — Differential Automated: ABSOLUTE NEUT COUNT: 5.42 10*3/uL (ref 1.80–6.90)

## 2023-09-16 LAB — CBC: NUCLEATED RBC%, AUTOMATED: 0 % (ref 11.1–15.5)

## 2023-09-16 MED ADMIN — SODIUM CHLORIDE 0.9 % IV SOLN: @ 23:00:00 | Stop: 2023-09-16 | NDC 00338004902

## 2023-09-16 MED ADMIN — LOSARTAN POTASSIUM 25 MG PO TABS: 25 mg | ORAL | @ 16:00:00 | Stop: 2023-09-16 | NDC 68084034611

## 2023-09-16 MED ADMIN — PERFLUTREN LIPID MICROSPHERE 6.52 MG/ML IV SUSP: 1.5 mL | INTRAVENOUS | @ 18:00:00 | Stop: 2023-09-16

## 2023-09-16 MED ADMIN — MAGNESIUM SULFATE 2 GM/50ML IV SOLN: 2 g | INTRAVENOUS | @ 23:00:00 | Stop: 2023-09-16 | NDC 70121171901

## 2023-09-16 MED ADMIN — AMIODARONE HCL 200 MG PO TABS: 200 mg | ORAL | @ 16:00:00 | Stop: 2023-09-19 | NDC 60687043711

## 2023-09-16 MED ADMIN — APIXABAN 5 MG PO TABS: 5 mg | ORAL | @ 16:00:00 | Stop: 2023-09-16 | NDC 00003089431

## 2023-09-16 MED ADMIN — ASPIRIN 81 MG PO CHEW: 81 mg | ORAL | @ 16:00:00 | Stop: 2023-09-17 | NDC 66553000201

## 2023-09-16 MED ADMIN — PANTOPRAZOLE SODIUM 40 MG IV SOLR: 40 mg | INTRAVENOUS | @ 23:00:00 | Stop: 2023-09-17 | NDC 71288060010

## 2023-09-16 MED ADMIN — ALLOPURINOL 100 MG PO TABS: 200 mg | ORAL | @ 16:00:00 | Stop: 2023-09-22 | NDC 60687067711

## 2023-09-16 MED ADMIN — AMLODIPINE BESYLATE 5 MG PO TABS: 5 mg | ORAL | @ 16:00:00 | Stop: 2023-09-17 | NDC 00904637061

## 2023-09-16 MED ADMIN — ASCORBIC ACID 500 MG PO TABS: 500 mg | ORAL | @ 16:00:00 | Stop: 2023-09-22 | NDC 00904052361

## 2023-09-16 MED ADMIN — OYSTER CALCIUM 1250 (500 CA) MG PO TABS: 1250 mg | ORAL | @ 16:00:00 | Stop: 2023-09-22 | NDC 00904188361

## 2023-09-16 MED ADMIN — CHOLECALCIFEROL 25 MCG (1000 UT) PO TABS: 25 ug | ORAL | @ 16:00:00 | Stop: 2023-09-22

## 2023-09-16 MED ADMIN — PERFLUTREN LIPID MICROSPHERE 6.52 MG/ML IV SUSP: @ 18:00:00 | Stop: 2023-09-16 | NDC 11994001104

## 2023-09-16 MED ADMIN — ATORVASTATIN CALCIUM 20 MG PO TABS: 20 mg | ORAL | @ 04:00:00 | Stop: 2023-09-22 | NDC 68084009811

## 2023-09-16 MED ADMIN — AMIODARONE HCL 200 MG PO TABS: 200 mg | ORAL | @ 04:00:00 | Stop: 2023-09-19 | NDC 60687043711

## 2023-09-16 NOTE — Consults
 Occupational Therapy Evaluation      PATIENT: Andrew [name]  MRN: [id number]  DOB: 07/21/1955    ADMIT DATE: 09/09/2023     Date of Evaluation: 09/16/2023    Problems: Active Problems:    * No active hospital problems. *       Past Medical History:   Diagnosis Date    CVA (cerebral vascular accident) (HCC/RAF)     H/O aortic valve replacement     Kidney stone     Urinary frequency     Past Surgical History:   Procedure Laterality Date    AORTIC VALVE REPLACEMENT          Relevant Hospital Course: Pt is a 68 y/o M with hx of bicuspid AV s/p TAVR (2013) c/b CVA w/ LLE weakness. Pt diagnosed with tachy-brady SSS and recommended to transfer to [facility] for pacemaker placement. Pt is now s/p PPM on 09/14/23.     Patient Stated Goal:  Pt did not state goal - agreeable to OT eval     Living Arrangements   Type of Home: House  Home Layout: One level, Stairs to enter without rails  # Stairs to enter: 2  Bathroom Shower/Tub: Event organiser: Buyer, retail: Surveyor, quantity, Wheelchair  Additional Comments: Information obtained from pt and pt's wife at bedside    Prior Level of Function   Level of Independence: Wheelchair mobility, Community ambulation, Assisted, Functional transfers, Household ambulation, Front wheeled walker  Lives With: Spouse  Support Available: Family  # of hours available: Wife works remotely from home  ADL Assistance: Independent, Activities of Daily Living, Instrumental Activities of Daily Living  Vision: Within Functional Limits  Hearing: Within Functional Limits  Transportation: Driven by Others    Precautions   Precautions: Fall Librarian, academic;Check Labs  Orthotic: Left;AFO;Right;CAM/walking Boot (at bedside)  Current Activity Order: Activity as tolerated    GENERAL EVALUATION   Position: In bed;Family/CG present  Lines/devices Drains: HLIV;Cardiac Monitor;Pulse Ox    Bed Mobility   Rolling: Moderate Assist;to Right;to Left;Side Rails;Verbal Cueing  Supine to Sit: Moderate Assist;Assist for LE(s);Verbal Cueing  Sit to Supine: Maximum Assist;Second Person Assist;Assist for LE(s);Verbal Cueing    Functional Transfers   Sit to Stand: Minimum Assist;Verbal Cueing;Assistive Device (Comment) (from EOB w/ FWW. VCs for hand placement)    Activities of Daily Living (ADLs)   LB Dressing: Performed (Don/doff pants)  LB Dressing Assistance: Maximum Assist (Don pants at EOB (able to thread legs, rq assist to bring over hips and tie drawstring). Doff pants in bed (MaxA). Dependent to don AFO and shoe)  LB Dressing Deficit: Increased time to complete;Supervision/safety;Verbal cueing;Pull up over hips;Don/doff L shoe;Don/doff R shoe  LB Dressing Adaptive Equipment: None  LB Dressing Where Assessed: Edge of bed;Standing;Bed level    Balance   Sitting - Static: Fair     RUE Assessment   RUE Assessment: Within Functional Limits     LUE Assessment   LUE Assessment: Within Functional Limits      Hand Function   Gross Grasp: Functional  Coordination: Functional    Edema   Edema: none noted    Sensation   Sensation: Grossly intact    Cognition   Cognition: Exceptions to WDL or Baseline Status  Arousal/Alertness: Appropriate responses to stimuli  Attention Span: Difficulty attending to directions (At time rq repetition of commands)  Following Commands: Follows one step commands with increased time;Follows one step commands with repetition  Initiation: Fair  Safety Awareness: Fair awareness of safety precautions  Barriers to Learning: None    Vision   Current Vision: No visual deficits     Neurological Evaluation (if indicated)   Neuro Deficits: No    Pain Assessment   Patient complains of pain: Yes  Pain Quality: Patient unable to describe  Pain Scale Used: Numeric Pain Scale  Pain Intensity: 6/10  Pain Location: Other (Comment) (Site of PPM implantation)  Action Taken: Nursing notified    Exercises  Other Exercise(s): Pt performed 2 STS from EOB w/ MinA w/ FWW. Pt able to maintin static standing, however pt reporting dizziness. Pt rq multiple VCs to direct gaze upward and outward.    Patient Status   Activity Tolerance: Fair  Oxygen Needs: Room Air  Response to Treatment: Dizzy;with activity;Resolved with rest;Nursing notified (BP at rest 131/83 (93), 77/58 (63) seated at EOB, 90/75 (86) returned to bed w/ HOB elevated)  Compliance with Precautions: Fair (PPM)  Call light in reach: Yes  Presentation post treatment: In bed;On cardiac monitor;Lines/drains intact;Pulse Ox;w/RN  Comments: Deferred further functional mobility due to pt reporting dizziness while seated at EOB and standing. Pt declined to use BSC or sit in chair. Educated pt on importance of OOB activity. Encouraged pt to transfer to chair w/ nursing.    Interdisciplinary Communication   Interdisciplinary Communication: Nurse;Physical Therapist    Co-treatment with PT provided for patient due to complexity of patient diagnosis, which requires the judgment, knowledge, and skills of both qualified therapists.  The patient requires both therapists working in collaboration to meet the goals and will have better outcomes working in co-treatment than they would by completing the therapy in a 1:1 session.  OT tx with focus on goals of ADL and functional mobility.    ASSESSMENT   Rehab Potential: Good  Inpatient Recommendation: OT treatment  Problem List: Pain limiting ADLs;Decreased safety awareness;Decreased functional transfers;Decreased ability to integrate precautions;Impaired functional endurance;Decreased self care skills;Decreased functional balance  Present During Evaluation: Other reduced mobility (Z74.09)  Treatment Plan: ADL training;Caregiver training;Splinting/positioning;Graded functional activities;Perceptual-motor training;Equipment evaluation training;Fine motor training;Range of motion/self ranging;Orthotic adjustment as needed;Energy conservation;Functional cognitive tasks;Functional balance activities;Gross motor training;Therapeutic exercise;Discharge planning;Edema reduction techniques;Home program;Patient and/or family education;Sensation retraining/compensation;Training on use of assistive devices;Neurofacilitation/muscle re-education;Coordinate with nurse to pre-medicate patient as needed;Functional transfer training  Frequency: 3-5 x/week  Duration (days): 30  Progress Note Due Date: 09/23/23    Goals Discussed With: Patient    Short Term Goals to be achieved in: 7 days  Pt will groom self: sitting in chair;with stand by assist  Pt will dress upper body: sitting in chair;with minimum assist  Pt will dress lower body: sitting in chair;with minimum assist  Pt will perform: stand step transfer;to/from commode;to/from toilet;with minimum assist    Long Term Goals to be achieved in: 30 days  Pt will be: assisted performing self care activities with verbal cues for safety and appropriate assistance and equipment, as needed    OT Recommendations   Discharge Recommendation: Occupational Therapy;Would benefit from continued therapy  Discharge concerns: Requires assistance for mobility;Requires assistance for self care  Discharge Equipment Recommended: Owns recommended DME    AM-PAC   AM-PAC Daily Activity Raw Score: 15  AM-PAC Daily Activity t-Scale Score: 34.69  AM-PAC Daily Activity CMS 0-100% Score: 56.46 %  AM-PAC Daily Activity CMS 'G Code' Modifier: CK                       Evaluation Completed  by: Tawni Seip, OT,  09/16/2023

## 2023-09-16 NOTE — Consults
 IP CM ACTIVE DISCHARGE PLANNING  Department of Care Coordination      Admit Ijuz:[id number]  Anticipated Date of Discharge: 09/19/2023    Following FI:Dmpcjdujcj, Andrew [name].*      Today's short update        09/16/23 1720   MD/NP   Today's progression of care melena; ECHO; Discharge planning: home health PT/OT/SN and DME commode   Case Management   Expected Disposition Home Health   Post-Acute Recommendations DME;Nursing   Discharge Address [address]  [place] [address]   Home Health Coordination Status Referral sent-out to providers (via [facility]) (3/7)   DME or RT Equipment Status Referral sent-out to providers (via [facility]) (3/7);Order and attestation written (2/7)         Disposition     Home w/Home Health  Durable Medical Equipment, Nursing  (P) [address]  [place] [address]  Family/Support System in agreement with the current discharge plan: No, they are not in agreement (Comment on Barrier)    Multidisciplinary Team Member Plan of Care   Interdisciplinary rounds were conducted with the multidisciplinary team including the clinical social worker and nurse case manager. The patient's plan of care and discharge plan were discussed and formulated based on the patient's specific needs.    Home Health Coordination Status (if applicable)     (P) Referral sent-out to providers (via [facility]) (3/7)    5:24 PM  CM sent home health referral via [facility].    DME or RT Equipment Status (if applicable)     (P) Referral sent-out to providers (via [facility]) (3/7), Order and attestation written (2/7)        CM sent DME [employer] order via [facility] and provided daughter's contact information for delivery to patient's home.    Other Arrangements (if applicable)     CM met with patient/wife bedside to discuss discharge planning. Per patient, has a history of a poor experience with SNF. Per patient, agreeable to home health. Per patient, requesting DME commode. Per wife, states lives in the [place] of daughter's home. Per patient/wife, requesting for commode to be delivered home and daughter or son in law would be able to receive commode delivery. Wife provided daughter's contact information [facility] [phone]). Per wife, will inform daughter of commode delivery to the house and that daughter may receive a call for delivery.    CM informed MD of patient DME request.       Andrew Mckinleigh Schuchart  DNP RN CNL     Department of Care Coordination and Clinical Social Work      Medicine Case Manager    Primary Team: [place]    T: [phone]    Pager: [phone]    Email: [email or web address]      --------------------------------------------------------------------------------------------------------------------------  For Weekend/Holiday/Swing Shift Case Management assistance contact [phone])  For assistance after hours:  -PM Case Manager: Rollene Dimes RN-CM work cell: [phone])  -PM Case Manager: Skipper Chihuahua RN-CM phone: [phone]  -ER Case Manager: [phone])  -Dorise GLENWOOD [facility] DME Liaison can be reached at [phone] or email: [email or web address]

## 2023-09-16 NOTE — Other
 [name] Clinical Goal:   Clinical Goal(s) for the Shift: VSS, stable cardiac rhythm, no c/p of cp/sob/n/v/pain, monitor new ICD site, replace lytes prn, maintain comfort and safety  Identify possible barriers to advancing the care plan: None  Stability of the patient: Moderately Unstable - medium risk of patient condition declining or worsening    Progression of [name] Clinical Goal: A&Ox4, NSR w BBB, HR 70-80s, SBP 130-150s, RA, no c/p of cp/sob/n/v/pain, PPM dressing c/d/I, no lytes replaced, maintained comfort and safety      Plan: pending PT/OT and echo

## 2023-09-16 NOTE — Consults
 Physical Therapy Evaluation      PATIENT: Andrew [name]  MRN: [id number]  DOB: 08-13-1955    ADMIT DATE: 09/09/2023       Date of Evaluation: 09/16/2023    Problems: Active Problems:    * No active hospital problems. *       Past Medical History:   Diagnosis Date    CVA (cerebral vascular accident) (HCC/RAF)     H/O aortic valve replacement     Kidney stone     Urinary frequency     Past Surgical History:   Procedure Laterality Date    AORTIC VALVE REPLACEMENT          Relevant Hospital Course: 68 y/o M with hx of bicuspid AV s/p TAVR (2013) c/b CVA w/ LLE weakness. Pt diagnosed with tachy-brady SSS and recommended to transfer to [facility] for pacemaker placement. Pt is now s/p PPM on 09/14/23.      Patient Stated Goal: none stated but agreeable to PT     Living Arrangements   Type of Home: House  Home Layout: One level, Stairs to enter without rails  # Stairs to enter: 2 (step down)  Bathroom Shower/Tub: Pension scheme manager: Midwife: Buyer, retail: Front wheeled walker, [employer]  Additional Comments: Information obtained from pt and pt's wife at bedside    Prior Level of Function   Level of Independence: Supervised, Household ambulation, Front wheeled walker, [employer] ambulation, Wheelchair mobility (per pt use of wc for community)  Lives With: Spouse  Support Available: Family  # of hours available: Wife works remotely from home- assists as needed; per pt recent small ankle fracture may15th- has been wearing cam boot since  ADL Assistance: Independent, Activities of Daily Living, Instrumental Activities of Daily Living  Homemaking Assistance: Needs assistance  Vision: Within Functional Limits  Hearing: Within Functional Limits  Transportation: Driven by Others    Precautions   Precautions: Fall Librarian, academic;Check Labs;Other (Comment) (new PPM)  Orthotic: Left;AFO;Right;CAM/walking Boot (at bedside)  Current Activity Order: Activity as tolerated  Weight Bearing Status: Not Applicable    GENERAL EVALUATION   Position: In bed;Family/CG present (wife at bedside)  Lines/devices Drains: HLIV;Cardiac Monitor;Pulse Ox     Bed Mobility   Rolling: Minimum Assist;Verbal Cueing;Side Rails;to Right;to Left;HOB flat  Supine to Sit: Moderate Assist;to Left;Verbal Cueing;Assist for LE(s);Side Rails (HOB 30)  Sit to Supine: Maximum Assist;Second Person Assist;Verbal Cueing;Assist for LE(s) (due to pt dizziness required increased assist to perform safely)    Functional Mobility   Sit to Stand: Minimum Assist;Second Person Assist;Verbal Cueing;Assistive Device (Comment) (second person assist for safety- with FWW from bed. cam boot and AFO donned in sitting prior to transfer)  Ambulation: Not Performed;Unable     Balance   Sitting - Static: Posterior Lean;with UE support;Fair  Sitting - Dynamic: Able to weight shift anteriorly;Able to weight shift posteriorly;Able to weight shift laterally;with UE support;Fair;Retropulses  Standing - Static: with UE support;Fair    UE Assessment   R UE Assessment: Within functional limits  L UE Assessment: Within functional limits (shldr range to 90- limited due to new PPM placement)     LE Assessment   RLE Assessment: Within Functional Limits         LLE Assessment: Gross Assessment  at baseline df weakness d/t hx CVA- gross 3/5 hip and knee    Sensation   Sensation: Grossly intact    Cognition   Cognition: At Baseline Cognitive Status  Safety Awareness: Fair awareness of safety precautions  Barriers to Learning: None    Neurological Evaluation (if indicated)   Neuro Deficits: No    Pain Assessment   Patient complains of pain: Yes  Pain Quality: Aching;Sore  Pain Scale Used: Numeric Pain Scale  Pain Intensity: 6/10  Pain Location: Left;Chest;Arm  Action Taken: Nursing notified;Therapy techniques to manage pain;Positioning    Patient Status   Activity Tolerance: Fair  Oxygen Needs: Room Air  Response to Treatment: Tolerated treatment well;Dizzy;Vital signs changes (Comment);Fatigued;Pain;with activity;Nursing notified (low BP in sitting with pt symptomatic- reported dizziness with time up in seated. BP at rest 131/83 (93), 77/58 (63) seated at EOB, 90/75 (86) supine)  Compliance with Precautions: Not Tested  Call light in reach: Yes  Presentation post treatment: In bed;Lines/drains intact;Pulse Ox;On cardiac monitor;Family/CG present    Interdisciplinary Communication   Interdisciplinary Communication: Nurse;Occupational Therapist    Co-treatment with OT provided for patient due to complexity of patient diagnosis, which requires the judgment, knowledge, and skills of both qualified therapists.   The patient requires both therapists working in collaboration to meet the goals and will have better outcomes working in co-treatment than they would by completing the therapy in a 1:1 session.PT tx with focus on goals of mobility and balance.    ASSESSMENT   Rehab Potential: Good  Inpatient Recommendation: PT treatment  Problem List: Decreased bed mobility;Decreased transfers;Decreased gait;Decreased activity tolerance;Decreased knowledge of precautions;Pain limiting function;Discharge needs;Decreased endurance;Fall risk;Need for caregiver/family education;Decreased strength;Impaired balance;Decreased knowledge of exercise program;Stairs  Present During Evaluation: Other reduced mobility (Z74.09);Limitation of activities due to disability (Z73.6)  Treatment Plan: Bed mobility training;Transfer training;Gait training;Balance training;Discharge planning;Coordinate with nurse to pre-medicate patient as needed;Coordinate pain management with team;Pre-gait training;Caregiver education;Education on precautions;Patient and/or family education;Therapeutic exercise;Home program;Stair training  Frequency: 3-5 x/week  Duration (days): 30  Progress Note Due Date: 09/23/23    Goals Discussed With: Patient    Short Term Goals to be achieved in: 7 days  Pt will perform supine to sit: with minimum assist;consistently  Pt will perform sit to stand: with contact guard assist;with FWW  Pt will ambulate: 31-50 feet;with FWW;with minimum assist  Pt will perform all functional mobility: while adhering to precautions    Long Term Goals to be achieved in: 30 days  Pt will perform supine to sit: with supervision  Pt will perform sit to stand: with supervision;with FWW  Pt will ambulate: 151-200 feet;with FWW;with supervision  Pt will go up/down stairs: 1-2 stairs;with railing    PT Recommendations   Discharge Recommendation: Physical Therapy;Would benefit from continued therapy  Discharge concerns: Requires assistance for mobility;Requires assistance for self care  Discharge Equipment Recommended: Owns recommended DME    AM-PAC   AM-PAC Basic Mobility Raw Score: 13  AM-PAC Basic Mobility t-Scale Score: 33.99  AM-PAC Basic Mobility CMS 0-100% Score: 57.65 %    Evaluation Completed by: Chiquita Earnie Molt, PT,  09/16/2023

## 2023-09-16 NOTE — Progress Notes
 .  CCU Progress Note      PMD: Jeana Francisco, MD  DATE OF SERVICE: 09/17/2023  HOSPITAL DAY: 8  CHIEF COMPLAINT: No chief complaint on file.      ID: Jaice Digioia is a 69 y.o. male 68 y.o. w/ h/o bicuspid AV sp TAVR 2013 (bioprosthetic) cb CVA w residual LLE weakess, presented to OSH w pre-syncope transferred for reported tachy-brady pending PPM placement.     Last 24 Hour/Overnight Events   ON: ***    Subjective/Review of Systems   AM: ***    14-point ROS negative except as outlined above.    Medications     Scheduled:  allopurinol, 200 mg, Oral, Daily  amiodarone, 200 mg, Oral, BID  ascorbic acid, 500 mg, Oral, Daily with breakfast  atorvastatin, 20 mg, Oral, Nightly  calcium carbonate 1250 mg (500 mg elemental), 1,250 mg, Oral, Daily with breakfast  pantoprazole, 40 mg, IV Push, Q12H  cholecalciferol, 25 mcg, Oral, Daily   Continuous:       PRN:  sodium chloride      Physical Exam   Temp:  [36.1 ?C (97 ?F)-37.1 ?C (98.8 ?F)] 36.9 ?C (98.5 ?F)  Heart Rate:  [64-111] 76  Resp:  [17-18] 17  BP: (77-132)/(49-90) 102/71  NBP Mean:  [62-102] 81  SpO2:  [89 %-100 %] 98 %   UOP: Urine:  [100 mL-400 mL] 400 mL I/Os:   Intake/Output Summary (Last 24 hours) at 09/17/2023 9379  Last data filed at 09/17/2023 9488  Gross per 24 hour   Intake 2385 ml   Output 1150 ml   Net 1235 ml     Gen: Well-developed male in no acute distress  Eyes: PERRL, EOMI  ENT: OP clear, MMM  Neck: Supple, trachea midline, no cervical or supraclavicular lymphadenopathy  CV: Intermittently tachy then bradycardic   Pulm: CTAB, no wheezes/rales/rhonchi, normal work of breathing  Abd: NABS, soft, flat, NT, no masses or organomegaly  Skin: Warm and dry, no rashes  Ext: No cyanosis or edema. Distal pulses 2+ and symmetric  Neuro: A&Ox4, CNII-XII intact, 4/5 strength in LLE, 5/5 strength in all other ext, sensation grossly intact    Laboratory Data     CBC:  Recent Labs     09/17/23  0434 09/16/23  2318 09/16/23  1746   WBC 9.59 10.20* 10.46* NEUTABS 5.74 6.64 6.25   HGB 12.1* 12.8* 12.8*   HCT 36.4* 40.8 40.0   MCV 87.5 94.7 88.1   PLT 100* 102* 108*     BMP:  Recent Labs     09/17/23  0434 09/16/23  0826 09/16/23  0655   NA 139 138 139   K 4.4 5.0 5.1   CL 105 103 102   CO2 24 26 24    BUN 20 20 17    CREAT 0.72 0.78 0.84   GLUCOSE 104* 129* 116*   CALCIUM 8.3* 9.0 9.1   MG 2.1*  --  1.7     Lactate 19     LFT:  Recent Labs     09/17/23  0434 09/16/23  0826   TOTPRO 6.2 7.1   ALBUMIN 3.4* 3.9   BILITOT 0.4 0.4   ALT 44 71*   AST 23 34   ALKPHOS 63 79     COAGS:  Recent Labs     09/16/23  1918   INR 1.1   PT 13.8       UA:  No results for input(s): ''SPECGRAVUR'', ''PHUR'', ''BLDUR'', ''  KETONESUR'', ''GLUCOSEUR'', ''PROTCLUR'', ''NITRITEUR'', ''LEUKESTUR'', ''RBCSUR'', ''WBCSUR'' in the last 72 hours.    Microbiology     No results found for this or any previous visit (from the past week).       Imaging     Echo adult transthoracic w definity  Result Date: 09/16/2023  TRANSTHORACIC ECHOCARDIOGRAM REPORT  -------------------------------------------------------------------------------- Patient Name:   KWAN SHELLHAMMER Date of Exam:     09/16/2023 Medical Rec #:  [id number]        Patient Type:     IP Accession #:    [id number]       Patient Location: 7N Date of Birth:  Jun 28, 1955      Height:           66.0 in Patient Age:    68 years       Weight:           194.2 lb Patient Gender: M              BSA:              1.98 m                                BP:               125/90  -------------------------------------------------------------------------------- Diagnosis:  Z95.2-S/P AVR (aortic valve replacement) Indication: s/p AVR Referring Provider: [id number] [facility] Sonographer 1:      Danelle Ka Report prepared/completed by  Danelle Ka on 09/16/2023 at 10:31:21 AM Add-On Procedures Performed:  Definity (perflutren lipid microsphere) study                               (03625, V0042). At total of 2 ml of Definity was                               used to delineate left ventricular endocardial                               borders. Other Study Information:      Technically difficult study. Technically                               challenging study related to the patients body                               habitus.  --------------------------------------------------------------------------------    :  1. Technically very difficult study with suboptimal views.  2. Small LV size and severe concentric left ventricular hypertrophy.  3. Hyperdynamic LV systolic function. Normal left ventricular regional wall motion. Left ventricular ejection fraction is approximately >75%.  4. Mild LV diastolic dysfunction (grade I, impaired relaxation).  5. Normal right ventricular size. Normal RV systolic function.  6. A 25 mm Carpentier bioprosthetic valve is present in the aortic valve position. Echo findings are consistent with normal structure and function of the aortic prosthesis.  7. A prior echo performed on 08/08/2023 was reviewed for comparison. No significant changes noted since the previous study. FINDINGS: LEFT VENTRICLE: Left ventricular endocardium was not well-visualized. Recommend echo contrast for future studies. Small left ventricular size.  Severe concentric left ventricular hypertrophy. Hyperdynamic systolic function. Visually estimated left ventricular ejection fraction >75%. Patient demonstrated normal sinus rhythm during echocardiogram. Mild LV diastolic dysfunction (grade I, impaired relaxation). No thrombus was visualized within the left ventricle. LV Wall Motion: All segments are normal. RIGHT VENTRICLE: Normal right ventricular size. Normal RV systolic function. LEFT ATRIUM: Normal left atrial size. RIGHT ATRIUM: Normal right atrial size. Right atrial pressure is estimated at 3 mmHg. MITRAL VALVE: Thickened mitral valve leaflets. No mitral annular calcification. No evidence of mitral valve regurgitation. TRICUSPID VALVE: The tricuspid regurgitation jet was not well seen. AORTIC VALVE: A 25 mm Carpentier bioprosthetic valve is present in the aortic valve position. Echo findings are consistent with normal structure and function of the aortic prosthesis. Normal prosthetic valve function. The aortic valve area is 1.20 cm (Indexed AVA is 0.61 cm/m). The aortic valve maximum velocity is 2.4 m/s. The peak gradient across the aortic valve is 22 mmHg. The mean gradient is 11 mmHg. PULMONIC VALVE: The pulmonic valve was not well visualized. No indication of pulmonic valve regurgitation. AORTA: Normal aortic root (3.5 cm). Normal mid ascending aorta (3.5 cm). SYSTEMIC VEINS: The inferior vena cava is normal in size and exhibits greater than 50% respiratory change. PERICARDIUM: There is no evidence of pericardial effusion.  2D AND M-MODE MEASUREMENTS: Left Ventricle:         Aorta/Left Atrium: IVSd (2D):      2.0 cm  Aortic Root, d (2D): 3.5 cm LVPWd (2D):     2.1 cm  Right Ventricle: LVIDd (2D):     2.4 cm  RV basal minor, A4C: 3.7 cm LV RWT:         1.78    RV Mid-cavity:       2.8 cm LV mass:        221     RV major, A4C:       6.6 cm LV SYSTOLIC FUNCTION BY 2D PLANIMETRY (MOD): WMSI: 1.0  LV DIASTOLIC FUNCTION: MV Peak E: 49 cm/s  e' lateral: 6.2 cm/s E/e' lateral: 7.9 MV Peak A: 123 cm/s Decel Time: 172 msec E/A Ratio: 0.4 RA Volumes: RA VOL A4C:        26 ml    RA Vol A4C index: 13 ml/m RA area: RA area s, MOD A4C 11.9 cm RA vol s, MOD A4C 26.5 ml LA Volumes: LA Vol A4C:       24 ml    LA Vol A4C index: 12 ml/m LA area: LA area s MOD A4C 12.6 cm LA vol s, MOD A4C 24 ml Aortic Valve: AoV Max Vel: 2.4 m/s AoV Peak PG: 22 mmHg AoV Mean PG: 11 mmHg LVOT Vmax: 0.81 m/s LVOT VTI: 12.0 cm  AoV VTI:       31.4 cm LVOT SV:   38 ml    LVOT SI:  19 ml/m LVOT Diameter: 2.0 cm AoV Area, Vmax: 1.08 cm AoV Area, VTI:  1.20 cm                          AoV Area index: 0.61 cm/m Dimensionless index: 0.38 Tricuspid Valve and PA/RV Systolic Pressure: RA Pressure: 3 mmHg Aorta: Ao Asc: 3.5 cm Systemic Veins: IVC Diameter: 1.95 cm  [id number] Rachele Haste MD Electronically signed by [id number] Rachele Haste MD on 09/16/2023 at 1:38:41 PM cc: [phone] [facility] ZHAO   Final          Assessment/Plan   Curtistine  Mcfarlane is a 68 y.o. male  w/ h/o bicuspid AV sp TAVR 2013 (bioprosthetic) cb CVA w residual LLE weakess, presented to OSH w pre-syncope transferred for reported tachy-brady s/p PPM and now admitted for melena now s/p EGD showing gastric ulcers. Was re-upgraded to COU service ON 6/19 for unstable SVT.     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 Then no recurrence and so was not on AC. Two admissions 08/2023 second of which had recurrence of fib and managed with dilt and apix. S/P PPM TTE showed no pericardial effusion. He was re-upgraded to COU 6/18 for ongoing unstable SVT with aberrancy in which episodes would self terminate. Episodes largely resolved after rebolusing amiodarone. Also found to have severe LVH on echo (09/15/23) which may be from HOCM vs amyloidosis.   - Re bolus IV amiodarone 150 mg, then 1 mg/min, now 0.5 mg/min for 1 day  - Start metop tartrate 25 mg q6h  - [hold] Amio 200 bid x 2 weeks (6/11-6/24) and then 200 every day after per EP  - Resume apix 5mg  BID tonight  - s/p PPM placement on 6/14   - trend troponin   - EP c/s, appreciate recs  - LVH work up:    - PEP and Total protein    - PEP urine, random    - immunofixation, serum   - immunofixation, urine    - kappa/lambda quant    - NM cardiac amyloidosis        #Melena s/p EGD showing gastric ulcers s/p treatment  Picture in chart and HgB drop from 15->12. GI performed EGD showing gastric ulcers s/p hemoclips x2.  -GI c/s  -Pantoprazole 40 mg IV BID (on d/c transition to PO)  -Avoid NSAIDS  -Will need outpatient surveillance EGD in 2 months w/ GI     #Hx bicupsid AV sp surgical bioprosthetic AV (2013) :   - ASA   - will need one time eval to rule out coarct, can be done outpt  - family screening      #Hx periop CVA from AVR in 2013   Noted to have recovered function significant amount of function but continues to feel off balance and only 4/5 strength in LLE   - Consider resuming ASA 81 if stable from GI bleed perspective  - Atorvastatin  - PT/OT in AM     #discharge planning  - PT/OT to assess home mobility needs     #HTN:  - Holding home Amlodipine 5mg    - Holding home losartan      #HLD:   - Atorvastatin     #Gout:   - Allopurinol      #Osteoporosis   #Vit D- Deficiency   #Hx of medial malleolus fracture  - calcium carbonate 1250 mg QAM  - vitamin-D 25 mcg daily  - CAM boot as tolerated         I have seen and examined the patient and agree with the RD assessment and plan. See RD note from this hospital admission for additional details.              # IP Checklist  - Diet: Diet 2 gram sodium  - VTE PPX: On therapeutic anticoagulation  - LDA:   Peripheral IV 22 G Anterior;Right;Medial Forearm (9)  Peripheral IV 22 G Anterior;Distal;Right Arm (8)  Peripheral IV 22 G  Anterior;Left;Proximal Forearm (5)     Advance Care Planning:   Full Code,  Primary Emergency Contact: Herold Lanny Ip, Home Phone: [phone], Mobile Phone: [phone]     Disposition:  Expected post-hospitalization disposition will be to Home.     Discussed with attending Kenn Zeke POUR.*,    Rosaline Blower, MD 09/19/2023 10:00 AM  PGY-1, [facility] Internal Medicine & Pediatrics

## 2023-09-16 NOTE — Progress Notes
 .  CCU Progress Note      PMD: Jeana Francisco, MD  DATE OF SERVICE: 09/16/2023  HOSPITAL DAY: 7  CHIEF COMPLAINT: No chief complaint on file.      ID: Andrew [name] is a 68 y.o. male 68 y.o. w/ h/o bicuspid AV sp TAVR 2013 (bioprosthetic) cb CVA w residual LLE weakess, presented to OSH w pre-syncope transferred for reported tachy-brady pending PPM placement.     Last 24 Hour/Overnight Events   ON: NAEON     Subjective/Review of Systems   AM: Feels well. Had questions about things to look out for with a pacemaker.     14-point ROS negative except as outlined above.    Medications     Scheduled:  allopurinol, 200 mg, Oral, Daily  amiodarone, 200 mg, Oral, BID  amLODIPine, 5 mg, Oral, Daily  apixaban, 5 mg, Oral, BID  ascorbic acid, 500 mg, Oral, Daily with breakfast  aspirin, 81 mg, Oral, Daily  atorvastatin, 20 mg, Oral, Nightly  calcium carbonate 1250 mg (500 mg elemental), 1,250 mg, Oral, Daily with breakfast  losartan, 25 mg, Oral, Daily  cholecalciferol, 25 mcg, Oral, Daily   Continuous:       PRN:  polyethylene glycol, senna      Physical Exam   Temp:  [36 ?C (96.8 ?F)-36.2 ?C (97.2 ?F)] 36.1 ?C (97 ?F)  Heart Rate:  [77-97] 97  Resp:  [17-18] 17  BP: (125-151)/(81-94) 125/90  NBP Mean:  [98-109] 102  SpO2:  [91 %-97 %] 93 %   UOP: Urine:  [0 mL-300 mL] 150 mL I/Os:   Intake/Output Summary (Last 24 hours) at 09/16/2023 0916  Last data filed at 09/16/2023 0846  Gross per 24 hour   Intake 1200 ml   Output 1850 ml   Net -650 ml     Gen: Well-developed male in no acute distress  Eyes: PERRL, EOMI  ENT: OP clear, MMM  Neck: Supple, trachea midline, no cervical or supraclavicular lymphadenopathy  CV: Intermittently tachy then bradycardic   Pulm: CTAB, no wheezes/rales/rhonchi, normal work of breathing  Abd: NABS, soft, flat, NT, no masses or organomegaly  Skin: Warm and dry, no rashes  Ext: No cyanosis or edema. Distal pulses 2+ and symmetric  Neuro: A&Ox4, CNII-XII intact, 4/5 strength in LLE, 5/5 strength in all other ext, sensation grossly intact    Laboratory Data     CBC:  Recent Labs     09/16/23  0655 09/14/23  0502   WBC 8.20 6.25   NEUTABS 5.42 3.38   HGB 14.3 15.2   HCT 43.6 45.5   MCV 86.0 84.4   PLT 114* 189     BMP:  Recent Labs     09/14/23  0502   NA 139   K 4.0   CL 102   CO2 24   BUN 11   CREAT 0.83   GLUCOSE 102*   CALCIUM 9.3   MG 1.9     LFT:  No results for input(s): ''TOTPRO'', ''ALBUMIN'', ''BILITOT'', ''BILICON'', ''ALT'', ''AST'', ''ALKPHOS'', ''GGT'', ''AMYLASE'', ''LIPASE'' in the last 72 hours.  COAGS:  No results for input(s): ''INR'', ''PT'', ''APTT'' in the last 72 hours.    UA:  No results for input(s): ''SPECGRAVUR'', ''PHUR'', ''BLDUR'', ''KETONESUR'', ''GLUCOSEUR'', ''PROTCLUR'', ''NITRITEUR'', ''LEUKESTUR'', ''RBCSUR'', ''WBCSUR'' in the last 72 hours.    Microbiology     Recent Results (from the past week)   MRSA Surveillance, Nares    Collection Time: 09/09/23 11:45 PM  Specimen: Nares; Respiratory, Upper   Result Value Ref Range    MRSA Surveillance       No Methicillin-resistant Staphylococcus aureus isolated.        Imaging     No imaging has been resulted in the last 24 hours       Assessment/Plan   Andrew [name] is a 68 y.o. male 68 y.o. w/ h/o bicuspid AV sp TAVR 2013 (bioprosthetic) cb CVA w residual LLE weakess, presented to OSH w pre-syncope transferred for reported tachy-brady pending PPM placement.     #Tachy/SVTGLENWOOD Cleaves Arrhythmia  #pAfib  Initial fib diagnosis after valve replacement in 2013. Then no recurrence and so was not on AC. Two admissions 08/2023 second of which had recurrence of fib and managed with dilt and apix. Re-present and given IV dilt with 5 second pause. Tx for PPM.      EKG and cardiac monitoring shows episodes of SVT then followed by sinus bradycardia with sinus pauses  - Amio 200 bid x 2 weeks and then 200 every day after per EP  - Start apix 5mg  BID   - s/p PPM placement on 6/14   - follow up echo (pending)     #Hx bicupsid AV sp surgical bioprosthetic AV (2013) :   - ASA   - will need one time eval to rule out coarct, can be done outpt  - family screening      #Hx periop CVA from AVR in 2013   Noted to have recovered LLE strength but continues to feel off balance   - ASA  - Atorvastatin  - PT/OT after PPM    #discharge planning  - PT/OT to assess home mobility needs     #HTN:  - Amlodipine 5mg       #HLD:   - Atorvastatin     #Gout:   - Allopurinol      #Osteoporosis   #Vit D- Deficiency   - Continue on Vit Supplements      #HX of medial malleolus fracture   -CAM boot as tolerated     Inpatient Checklist:  Diet: Orders Placed This Encounter      Diet regular    DVT Prophylaxis: Heparin Gtt  GI Prophylaxis: not indicated  Central Lines: none  Tubes/Drains: none    Disposition:     Code Status:   Full Code  Goals of Care Discussed: code status addressed    Contact:  Primary Emergency Contact: Andrew [name], Andrew [name], Home Phone: [phone]    Discussed with attending, Dr. Kenn.     Rosaline Blower, MD 09/16/2023 9:16 AM  PGY-1, [facility] Internal Medicine & Pediatrics    I have interviewed and examined the patient with the resident/fellow physician on the date of service. All labs, studies, and medications were independently reviewed by me. I have reviewed the note above and agree with the history, physical, assessment and plan which we formulated together. The patient was informed of the above plan, demonstrated understanding and was agreeable to the plan as written above.    35 minutes were spent personally by me today on this encounter which include today's pre-visit review of the chart, obtaining appropriate history, performing an evaluation, documentation and discussion of management with details supported within the note for today's visit. The time documented was exclusive of any time spent on procedures/device interrogations, if applicable.     Andrew LOIS Kenn, MD  General Cardiology  Advanced Heart Failure and Transplant Cardiology   [facility]

## 2023-09-17 ENCOUNTER — Ambulatory Visit: Payer: Commercial Managed Care - Pharmacy Benefit Manager

## 2023-09-17 LAB — Magnesium: MAGNESIUM: 2.1 meq/L — ABNORMAL HIGH (ref 1.4–1.9)

## 2023-09-17 LAB — Blood Lactate
BLOOD LACTATE: 19 mg/dL — ABNORMAL HIGH (ref 5–18)
BLOOD LACTATE: 10 mg/dL (ref 5–18)

## 2023-09-17 LAB — Comprehensive Metabolic Panel
ALBUMIN: 3.4 g/dL — ABNORMAL LOW (ref 3.9–5.0)
CHLORIDE: 105 mmol/L (ref 96–106)
UREA NITROGEN: 20 mg/dL (ref 7–22)

## 2023-09-17 LAB — Differential Automated
ABSOLUTE LYMPHOCYTE COUNT: 2.57 10*3/uL (ref 1.30–3.40)
EOSINOPHIL PERCENT, AUTO: 1.2 % (ref 0.00–0.04)
IMMATURE GRANULOCYTES%: 0.3 % (ref 0.00–0.50)
LYMPHOCYTE PERCENT, AUTO: 25.5 % (ref 1.30–3.40)

## 2023-09-17 LAB — Prothrombin Time Panel: PROTHROMBIN TIME: 13.8 s (ref 11.5–14.4)

## 2023-09-17 LAB — CBC
ABSOLUTE NUCLEATED RBC COUNT: 0 x10E3/uL (ref 0.00–0.00)
MEAN CORPUSCULAR VOLUME: 94.7 fL (ref 79.3–98.6)
NEUTROPHILS ABS (PRELIM): 5.74 10*3/uL (ref 31.5–35.5)
NUCLEATED RBC%, AUTOMATED: 0 % (ref 9.3–13.0)

## 2023-09-17 MED ADMIN — METOPROLOL TARTRATE 12.5 MG PO TABS: 12.5 mg | ORAL | @ 01:00:00 | Stop: 2023-09-17

## 2023-09-17 MED ADMIN — PHENYLEPHRINE HCL (PRESSORS) 10 MG/ML IV SOLN: INTRAVENOUS | @ 20:00:00 | Stop: 2023-09-17 | NDC 23155062041

## 2023-09-17 MED ADMIN — AMIODARONE HCL 200 MG PO TABS: 200 mg | ORAL | @ 16:00:00 | Stop: 2023-09-19 | NDC 60687043711

## 2023-09-17 MED ADMIN — SODIUM CHLORIDE 0.9 % IV BOLUS: 500 mL | INTRAVENOUS | @ 06:00:00 | Stop: 2023-09-17 | NDC 00338004903

## 2023-09-17 MED ADMIN — AMIODARONE HCL 200 MG PO TABS: 200 mg | ORAL | @ 05:00:00 | Stop: 2023-09-19

## 2023-09-17 MED ADMIN — ASCORBIC ACID 500 MG PO TABS: 500 mg | ORAL | @ 16:00:00 | Stop: 2023-09-22 | NDC 00904052361

## 2023-09-17 MED ADMIN — SODIUM CHLORIDE 0.9 % IV BOLUS: 250 mL | INTRAVENOUS | @ 03:00:00 | Stop: 2023-09-17

## 2023-09-17 MED ADMIN — PROPOFOL 200 MG/20ML IV EMUL (ANES): INTRAVENOUS | @ 20:00:00 | Stop: 2023-09-17

## 2023-09-17 MED ADMIN — MAGNESIUM SULFATE 2 GM/50ML IV SOLN: 2 g | INTRAVENOUS | @ 03:00:00 | Stop: 2023-09-17 | NDC 70121171901

## 2023-09-17 MED ADMIN — SODIUM CHLORIDE 0.9 % IV BOLUS: 500 mL | INTRAVENOUS | @ 03:00:00 | Stop: 2023-09-17 | NDC 00338004903

## 2023-09-17 MED ADMIN — OYSTER CALCIUM 1250 (500 CA) MG PO TABS: 1250 mg | ORAL | @ 16:00:00 | Stop: 2023-09-22 | NDC 00904188361

## 2023-09-17 MED ADMIN — PLASMA-LYTE-A PH 7.4 IV SOLN: INTRAVENOUS | @ 20:00:00 | Stop: 2023-09-17 | NDC 00338022104

## 2023-09-17 MED ADMIN — CHOLECALCIFEROL 25 MCG (1000 UT) PO TABS: 25 ug | ORAL | @ 16:00:00 | Stop: 2023-09-22

## 2023-09-17 MED ADMIN — PANTOPRAZOLE SODIUM 40 MG IV SOLR: 40 mg | INTRAVENOUS | @ 12:00:00 | Stop: 2023-09-18 | NDC 71288060010

## 2023-09-17 MED ADMIN — PROPOFOL 200 MG/20ML IV EMUL: INTRAVENOUS | @ 20:00:00 | Stop: 2023-09-17 | NDC 63323026929

## 2023-09-17 MED ADMIN — HEPARIN (PORCINE) IN NACL 25000-0.45 UT/250ML-% IV SOLN: 710 [IU]/h | INTRAVENOUS | Stop: 2023-09-18 | NDC 00409765005

## 2023-09-17 MED ADMIN — ONDANSETRON HCL 4 MG/2ML IJ SOLN: INTRAVENOUS | @ 20:00:00 | Stop: 2023-09-17 | NDC 60505613005

## 2023-09-17 MED ADMIN — ALLOPURINOL 100 MG PO TABS: 200 mg | ORAL | @ 16:00:00 | Stop: 2023-09-22 | NDC 60687067711

## 2023-09-17 MED ADMIN — SODIUM CHLORIDE 0.9 % IV BOLUS: 500 mL | INTRAVENOUS | @ 03:00:00 | Stop: 2023-09-17

## 2023-09-17 MED ADMIN — PANTOPRAZOLE SODIUM 40 MG IV SOLR: 40 mg | INTRAVENOUS | Stop: 2023-09-18 | NDC 71288060010

## 2023-09-17 MED ADMIN — ATORVASTATIN CALCIUM 20 MG PO TABS: 20 mg | ORAL | @ 05:00:00 | Stop: 2023-09-22 | NDC 68084009811

## 2023-09-17 MED ADMIN — LIDOCAINE HCL (CARDIAC) 100 MG/5ML IV SOSY: INTRAVENOUS | @ 20:00:00 | Stop: 2023-09-17 | NDC 76329339001

## 2023-09-17 NOTE — Progress Notes
 Pharmaceutical Services - Heparin Consult Management Note    Objective Data:   Allergies: Patient has no known allergies.  Height:   Most recent documented height   08/09/23 1.676 m (5' 6'')     Actual Weight:   Most recent documented weight   09/17/23 88.8 kg       Heparin drip administrations (last 48 hours)       None            Anticoagulant/Antiplatelet administrations (last 48 hours)       Date/Time Action Medication Dose    09/16/23 0851 Given    apixaban tab 5 mg 5 mg            Current Labs:  No results for input(s): ''APTT'' in the last 48 hours.  No results for input(s): ''HEPANTIXAUFH'' in the last 48 hours.  Recent Labs     09/17/23  1139   WBC 7.24   HGB 11.9*   HCT 36.3*   MCV 88.1   PLT 91*       INR   Date Value Ref Range Status   09/16/2023 1.1 . Final     Comment:     Therapeutic Range: INR 2-3  Mechanical Valves: INR 2.5-3.5       Bilirubin,Total   Date Value Ref Range Status   09/17/2023 0.4 0.1 - 1.2 mg/dL Final       Procedures:  Active/pending/resulted orders +/- 24hrs       None            Assessment:  Protocol: Very Low Intensity: Anti-Xa goal 0.1-0.3 or aPTT goal 46-66  Anti-Xa aPTT Hold Infusion Rate Change Check Anti-Xa/aPTT   <0.1 <46 - Increase by 2 units/kg/hr 6 hours after rate change   0.1-0.3 46-66 Continue current rate  Every 6 hours   0.31-0.4 66.1-77 - Decrease by 1 unit/kg/hr 6 hours after rate change   0.41-0.6 77.1-98 - Decrease by 2 units/kg/hr    0.61-0.8 98.1-118 Hold for 1 hour Decrease by 3 units/kg/hr    0.81-1 118.1-140 Hold for 1 hour Decrease by 4 units/kg/hr    >1 >140 Hold infusion and repeat STAT anti-Xa/aPTT in 1 hour. Notify provider to assess the patient for bleeding. Approval to resume heparin must be received from the provider prior to restarting heparin infusion based on the following. Pharmacist will document the provider?s name on the daily progress note.   If anti-Xa <= 0.7 or aPTT <= 108, restart infusion and decrease by 5 units/kg/hr. Repeat anti-Xa/aPTT 6 hours after rate change.  If anti-Xa > 0.7 or aPTT > 108, continue to hold and repeat anti-Xa/aPTT in 2 hours. Notify provider for further discussion.*    *When anti-Xa <= 0.5 or aPTT <= 87, restarting infusion at <= 50% of the previous rate may be considered.    Indication: Afib/Aflutter  Goal: aPTT 46-66 (last apix 6/16)  Dosing weight: 88 kg      68 y.o. 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 OSH admission 6/1 with irregular heart beats, transferred to [facility] for HLOC. Started on Eliquis ~08/2023 - tachy-brady syndrome, SSS, some Afib noted on OSH note.      6/10: Heparin start aPTT 66-87 for Afib  6/11: Around 2-2:30 am, patient had tachyarrhythmia, lightheadedness, consistent w/ tachy-brady syndrome. ICU  monitoring was not deemed necessary given stable hemodynamics. EP planning for PPM, possibly 6/16  6/12: Upgrade to CCU d/t unstable arrhythmia. Plan for add-on PPM   6/14: S/p PPM, okay to resume AC 48hrs after  6/17: EGD - Gastric ulcer, one area with a visible vessel s/p hemostasis, likely cause of recent bleeding. Restarting heparin at 0.1-0.3 goal given recent GIB    Plan:  Taz Vanness is a 68 y.o. male who has been referred to pharmacy for therapeutic heparin management.     Bolus: No bolus at this time  Infusion: Initiate at 710 units/hr    Next aPTT ordered at 2100   Plan was communicated to RN.  Pharmacy will continue to monitor the patient?s clinical progress and communicate any changes with treatment team.  For questions about this patient's heparin therapy, please call [phone] [facility]) or [phone] [facility]) or *[phone] [facility]).    Alm Tina Thom Jama, PharmD, 09/17/2023, 2:36 PM    Addendum:  Last  aPTT was 65.5 @ 2207 which is therapeutic on heparin 710 units/hr.      Plan:   Heparin rate: Continue at 710 units/hr  Next aPTT ordered at 0400 on 6/18.     Pharmacy will continue to monitor the patient?s clinical progress and communicate any changes with treatment team.     For questions about this patient's heparin therapy, please call [phone] [facility]) or [phone] [facility]).    Deanndra Kirley 09/17/2023 11:11 PM

## 2023-09-17 NOTE — H&P
 UPDATED H&P REQUIREMENT    WHAT IS THE STATUS OF THE [name] MOST CURRENT HISTORY AND PHYSICAL?   - The most current H&P was performed within the past 24 hours. No additional updated H&P documentation is necessary.     REFER TO MEDICAL STAFF POLICIES REGARDING PRE-PROCEDURE HISTORY AND PHYSICAL EXAMINATION AND UPDATED H&P REQUIREMENTS BELOW:    Karle Ovens [facility] Medical Staff Policy 200 - For Patients Undergoing Procedures Requiring Moderate or Deep Sedation, General Anesthesia or Regional Anesthesia    Contents of a History and Physical Examination (H&P):    The H&P shall consist of chief complaint, history of present illness, allergies and medications, relevant social and family history, past medical history, review of systems and physical examination, and assessment and plan appropriate to the patient's age.    For Patients Undergoing Procedures Requiring Moderate or Deep Sedation, General Anesthesia or Regional Anesthesia:    1. An H&P shall be performed within 24 hours prior to the procedure by a qualified member of the medical staff or designee with appropriate privileges, except as noted in item 2 below.    2. If a complete history and physical was performed within thirty (30) calendar days prior to the patient?s admission to the Medical Center for elective surgery, a member of the medical staff assumes the responsibility for the accuracy of the clinical information and will need to document in the medical record within twenty-four (24) hours of admission and prior to surgery or major invasive procedure, that they either attest that the history and physical has been reviewed and accepted, or document an update of the original history and physical relevant to the patient's current clinical status.    3. Providing an H&P for patients undergoing surgery under local anesthesia is at the discretion of the Attending Physician.     4. When a procedure is performed by a dentist, podiatrist or other practitioner who is not privileged to perform an H&P, the anesthesiologist?s assessment immediately prior to the procedure will constitute the 24 hour re-assessment.The dentist, podiatrist or other practitioner who is not privileged to perform an H&P will document the history and physical relevant to the procedure.    5. If the H&P and the written informed consent for the surgery or procedure are not recorded in the patient's medical record prior to surgery, the operation shall not be performed unless the attending physician states in writing that such a delay could lead to an adverse event or irreversible damage to the patient.    6. The above requirements shall not preclude the rendering of emergency medical or surgical care to a patient in dire circumstances.

## 2023-09-17 NOTE — Consults
 [facility] DEPARTMENT OF MEDICINE  DIVISION OF GASTROENTEROLOGY  GENERAL GI CONSULT NOTE    PATIENT: Andrew [name]  MRN: [id number]  DOB: 01-23-1956    ADMISSION DATE: 09/09/2023    HOSPITAL DAY: 8  DATE OF SERVICE: 09/17/2023   ATTENDING ON SERVICE (CONSULT REQUESTOR): Kenn Bosch K.*  REASON FOR CONSULT: Melena    HPI/SUBJECTIVE   Andrew [name] is a 68 y.o. male 68 y.o. 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 GI consulted for melena.    Patient reports he has not had issues with melena in the past, though notes he had endoscopy a very  long time ago in the [place] for a possible ulcer. No regular NSAID use though used it once 3 days prior to admission to OSH for presyncope. On daily ASA and not taking any acid suppressing medications. Remote smoking history. No family history of gastric or colorectal cancer.    MEDS   Scheduled:  allopurinol , 200 mg, Oral, Daily  amiodarone , 200 mg, Oral, BID  ascorbic acid , 500 mg, Oral, Daily with breakfast  atorvastatin , 20 mg, Oral, Nightly  calcium  carbonate 1250 mg (500 mg elemental), 1,250 mg, Oral, Daily with breakfast  pantoprazole , 40 mg, IV Push, Q12H  cholecalciferol , 25 mcg, Oral, Daily    PRN:  sodium chloride     Infusions:         PHYSICAL EXAM     Temp:  [36.1 ?C (97 ?F)-37.1 ?C (98.8 ?F)] 36.1 ?C (97 ?F)  Heart Rate:  [64-111] 73  Resp:  [17-18] 17  BP: (77-132)/(49-90) 107/74  NBP Mean:  [62-102] 84  SpO2:  [89 %-100 %] 99 %  BMI Readings from Last 1 Encounters:   09/17/23 31.60 kg/m?       System Check if examined and normal Positive or additional negative findings   Constit  [x]  General appearance - normal     Eyes  []  Conjuctivae clear       HENMT  []  Normal Lips/teeth/gums, oropharynx, head     Neck  []  Inspection; no masses palpated     Resp  []  Effort good. No labored breathing     CV  []  Normal rhythm/rate   []  Normal pulses Abdomen  [x]  Soft, non-tender, non-distended, no rebound guarding    []  Active bowel sounds 4 quadrants   []  No appreciable flank or shifting dullness   []  No hepatosplenomegaly   []  No umbilical hernia    Rectal   []  No external hemorrhoids or fissure   []  No masses palpated on DRE   []  Appropriate resting pressure/sphincter tone   []  Appropriate squeeze pressure   []  Appropriate bear down maneuver   []  Chaperone present for exam     MSK  []  Grossly normal strength, ROM     Neuro   [x]  Grossly normal      Psychiatric  []   Oriented to time, place and person   []   Appropriate insight/judgement & mood/affect             LABS/STUDIES     Labs Independently Interpreted by me today  Recent Labs     09/17/23  0434 09/16/23  2318 09/16/23  1746   WBC 9.59 10.20* 10.46*   NEUTPCT 59.9 65.1 59.8   HGB 12.1* 12.8* 12.8*   HCT 36.4* 40.8 40.0   PLT 100* 102* 108*  MCV 87.5 94.7 88.1     Recent Labs     09/17/23  0434 09/16/23  0826   NA 139 138   K 4.4 5.0   CL 105 103   CO2 24 26   ANIONGAP 10 9   BUN 20 20   CREAT 0.72 0.78     Recent Labs     09/17/23  0434 09/16/23  0826 09/16/23  0655   MG 2.1*  --  1.7   CALCIUM  8.3* 9.0 9.1      Recent Labs     09/17/23  0434 09/16/23  0826   TOTPRO 6.2 7.1   ALBUMIN 3.4* 3.9   BILITOT 0.4 0.4   ALT 44 71*   AST 23 34   ALKPHOS 63 79     Lab Results   Component Value Date    PT 13.8 09/16/2023    INR 1.1 09/16/2023    APTT 72.7 (H) 09/13/2023     No results found for: ''FE'', ''TIBC'', ''TRNSFERR'', ''FEBINDSAT'', ''FERRITIN''  No results found for: ''VITAMINB12'', ''FOLATE''    Hemoglobin Trend:   Lab Results   Component Value Date/Time    HGB 12.1 (L) 09/17/2023 04:34 AM    HGB 12.8 (L) 09/16/2023 11:18 PM    HGB 12.8 (L) 09/16/2023 05:46 PM    HGB 14.3 09/16/2023 06:55 AM         Imaging Tests Independently Interpreted by me Today   Last CT a/p: No results found for this or any previous visit.       Procedures  Reviewed    I reviewed these specialist notes that directly relate to the patient's acute and/or chronic medical problems with documentation of the salient findings, if any:    I have spoken with the following physicians/teams regarding the patient's care today:  Primary team    A&P   Andrew [name] is a 68 y.o. male 68 y.o. 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 GI consulted for melena.    #Melena  Patient more tachy with intermittently soft blood pressures and orthostatic symptoms, though some of this preceded the bleeding (and led to initiation hospitalization). Documented one epsiode of melena here with concomitant Hgb drop. Patient reports a history of ulcers, though no records available. Reasonable to pursue EGD to evaluate for sources of UGIB, esp given plans for ongoing AC.       ASSESSMENT & PLAN  - Maintain large-bore peripheral IV access, active T+S; transfuse for Hgb < 7, platelets < 50k, INR < 2.5  - Continue IV PPI BID  - Please have nursing upload photos of BM to Media tab       Code Status:  Full Code      Author:    Justus SAILOR. Maree MD 09/17/2023 8:10 AM  GI Fellow, pager [phone]  If after 5pm or on weekends, please page GI fellow on call    Discussed with attending gastroenterologist on service, Dr. Gerrie    I have seen and examined the patient and reviewed the pertinent medical record. I agree with the summary and plan as detailed above unless noted otherwise below.  Positive review of systems as stated in the fellow?s note, otherwise all other systems were reviewed and are negative as pertinent to this consultation. PMH, FH and, SH have been reviewed and are stated when pertinent, otherwise they  are non-contributory to the medical care of this patient. The fellow's note was edited to reflect my findings and recommendations.  Please see fellow?s note for additional details.    Salomon [place], MD   Assistant Professor of Medicine  Interventional Endoscopy Services  Division of Digestive Diseases  [employer] at [facility]

## 2023-09-17 NOTE — Procedures
 PATIENT NAME: Andrew [name], Andrew [name]  DATE OF BIRTH: 03/02/1956  RECORD NUMBER: [id number]  DATE/TIME OF PROCEDURE: 09/17/2023 / 12:06 PM  ENDOSCOPIST: Oliva Abts, MD  REFERRING PHYSICIAN:  FELLOW: Justus Fairly    INDICATIONS FOR EXAMINATION: 68 y.o. male 68 y.o. 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 GI consulted for melena, here for   EGD (apixaban held for 30 hours prior to procedure)  PROCEDURE PERFORMED: UPPER GI ENDOSCOPY - diagnostic    MEDICATIONS: MAC anesthesia    PROCEDURE TECHNIQUE:  Informed consent obtained for the procedure including risks, benefits and alternatives and the risk of those alternatives. Informed consent obtained for sedation including risks, benefits and alternatives and the risk of those alternatives.    EKG, pulse, pulse oximetry and blood pressure were monitored throughout the procedure.    The endoscope was passed with ease through the mouth under direct visualization; it was extended to the 2nd portion of the duodenum. The scope was withdrawn and the mucosa was carefully examined. The views were excellent. The patient's toleration of the   procedure was excellent.    Total Withdrawl Time:  Total Insertion Time:    EXTENT OF EXAM: 2nd part of the duodenum      INSTRUMENTS: [id number] #[id number]  TECHNICAL DIFFICULTY: No  LIMITATIONS: none TOLERANCE: Good  VISUALIZATION: Good        FINDINGS:  Esophagus: The esophagus appeared to be normal without evidence of esophagitis, rings, furrows, strictures. There was no hiatal hernia.  Stomach: There was diffuse ulceration in the antrum, lesser curve. Most areas were 1cm clean based, there was 1 area with a flat pigmented spot vs raised vessel. Hemostasis was performed with epinephrine 1:20,000 by 0.5cc x4. 2 hemoclips were placed over   the site. the surrounding mucosa had slight nodular erythema diffusely. On retroflexion no additional findings were noted.  Duodenum: The duodenum had erythema of the duodenal bulb, otherwise normal in the 2nd portion.    ESTIMATED BLOOD LOSS: None    DIAGNOSIS:  -Gastric ulcer, one area with a visible vessel s/p hemostasis, likely cause of recent bleeding    RECOMMENDATIONS:  -Ok to start clear liquid diet and advance as tolerated  -continue high dose PPI (IV gtt or 40mg  IV bid) x3 days  -Avoid excessive NSAID use  -check H pylori stool Ag and treat if positive  -ok to restart anticoagulation  -recommend surveillance EGD in 2 months to confirm healing (biopsies not taken today given on apixaban held less than 48 hrs)            This electronic signature authenticates all electronic and/or handwritten documentation, including orders, generated by the signer during the episode of care contained in this record.09/18/2023 12:52 AM by Oliva Abts, MD

## 2023-09-17 NOTE — Progress Notes
 Physical Therapy Treatment      PATIENT: Andrew [name]  MRN: [id number]    Treatment Date: 09/17/2023    Patient Presentation: Position: In bed;Family/CG present  Lines/devices Drains: HLIV;Cardiac Monitor;Pulse Ox    Pertinent Updates:      Precautions   Precautions: Fall risk;Monitor Vitals;Check Labs;Other (Comment)  Orthotic: Left;AFO;Right;CAM/walking Boot  Current Activity Order: Activity as tolerated  Weight Bearing Status: Not Applicable    Cognition   Cognition: At Baseline Cognitive Status  Safety Awareness: Fair awareness of safety precautions    Bed Mobility   Rolling: Minimum Assist;to Left;to Right  Supine to Sit: Moderate Assist;to Left;Assist for LE(s)  Sit to Supine: Moderate Assist;to Right;Assist for LE(s)    Functional Mobility   Sit to Stand: Minimum Assist;Verbal Cueing (w/ FWW EOB x1, Recliner chair x1)  Pre-Gait: x1 step fwd/bwd x5ea  Ambulation: Minimum Assist;Verbal Cueing  Ambulation Distance (Feet): 5'x2  Gait Pattern: Decreased stride length;Decreased pace;Impaired Sequencing;Decreased weight shift;Wide Base of Support  Assistive Device: Front wheeled walker     Balance   Sitting - Static: with UE support;Good  Sitting - Dynamic: Able to weight shift anteriorly;Able to weight shift posteriorly;Able to weight shift laterally;with UE support;Fair  Standing - Static: with UE support;Fair  Standing - Dynamic: No LOB;with UE support;Fair      AM-PAC   AM-PAC Basic Mobility Raw Score: 13  AM-PAC Basic Mobility t-Scale Score: 33.99  AM-PAC Basic Mobility CMS 0-100% Score: 57.65 %    Neurological (if indicated)   Neuro Deficits: No    Exercises   Seated Knee Flex/Ext: Active;Bilateral;10 Reps  Seated Hip Flexion: Active;Bilateral;10 Reps         Pain Assessment   Patient complains of pain: Yes  Pain Quality: Aching  Pain Scale Used: Numeric Pain Scale  Pain Intensity: Patient unable to rate  Pain Location: Right;Foot  Action Taken: Nursing notified;Therapy techniques to manage pain;Positioning      Patient Status   Activity Tolerance: Fair  Response to Treatment: Tolerated treatment well;Fatigued;with activity;Nursing notified;Vital signs stable;Asymptomatic;Resolved with rest  Compliance with Precautions: Not Tested  Call light in reach: Yes  Presentation post treatment: In bed;Lines/drains intact;Pulse Ox;On cardiac monitor;Family/CG present  Comments: Pt able to tolerate limited gait in room, min cueing for proper sequencing w/ fair carryover. No LOB noted during session.    Interdisciplinary Communication   Interdisciplinary Communication: Nurse;Physical Therapist    Treatment Plan   Continue PT Treatment Plan with Focus on: Bed mobility training;Transfer training;Gait training;Therapeutic exercise    PT Recommendations   Discharge Recommendation: Physical Therapy;Would benefit from continued therapy  Discharge concerns: Requires assistance for mobility;Requires assistance for self care  Discharge Equipment Recommended: Owns recommended DME    Treatment Completed by: Morgin Halls Zabalerio Prisilla Kocsis, PTA

## 2023-09-17 NOTE — Other
 [name] Clinical Goal:   Clinical Goal(s) for the Shift: VSS, stable cardiac rhythm, monitor hgb, keep pt hemodynamically stable, no c/p of cp/sob/n/v/pain, monitor new ICD site, replace lytes prn, maintain comfort and safety  Identify possible barriers to advancing the care plan: None  Stability of the patient: Moderately Unstable - medium risk of patient condition declining or worsening    Progression of [name] Clinical Goal: A&Ox4, NSR w BBB, HR 60-70s, SBP 80-110s (asymptomatic, MD aware), 1L NS bolus, 1 unit PRBC given, no s/sx transfusion rx, AM hgb 12.1, 2L NC, no c/p of cp/sob/n/v/pain, PPM dressing c/d/I, no lytes replaced, maintained comfort and safety      Plan: monitor hgb and hemodynamic stability, monitor stool

## 2023-09-17 NOTE — Progress Notes
 09/17/23 1337   Time Calculation   Encounter? Yes   Start Time 1530   Doc Time (min) 5 min   Last PT Treatment Date 09/16/23  (Attempted 6/17)   Patient not seen due to Patient off floor     Pt currently off the floor for upper GI Scope. Will follow up as able.    -Rap Yaviel Kloster, PTA xPh [phone]

## 2023-09-17 NOTE — Progress Notes
 09/17/23 1335   Time Calculation   Start Time 1335   Last OT Treatment Date 09/16/23  (Attempt 6/17)   Patient not seen due to Patient not available;Patient off floor  (Off floor for upper GI scope)   Chart accessed for Treatment scheduling or assignment     Will re-attempt as appropriate.    Wells , MS OT, [id number]

## 2023-09-17 NOTE — Progress Notes
 [facility] DEPARTMENT OF MEDICINE   PROGRESS NOTE    PATIENT: Andrew [name]               MRN: [id number]   PCP: Jeana Francisco, MD  ADMISSION DATE: 09/09/2023  HOSPITAL DAY: 8  DATE OF SERVICE: 09/02/2023  CC: No chief complaint on file.    Subjective & Significant Overnight Events    Patient downgraded from CCU to direct care today.  The patient is pending scope w/ GI today, remain NPO.  The patient denies fever, chills, nausea, vomiting, diarrhea, lightheadedness, dizziness.   He only had one episode of melena yesterday, no Bms since.    Medications   Objective (click to expand/collapse)   Scheduled:  allopurinol, 200 mg, Oral, Daily  amiodarone, 200 mg, Oral, BID  ascorbic acid, 500 mg, Oral, Daily with breakfast  atorvastatin, 20 mg, Oral, Nightly  calcium carbonate 1250 mg (500 mg elemental), 1,250 mg, Oral, Daily with breakfast  pantoprazole, 40 mg, IV Push, Q12H  cholecalciferol, 25 mcg, Oral, Daily    Infusions:      PRN:  Medications 09/16/23 09/17/23     sodium chloride 0.9% IV soln  Freq: As needed for Route: IV  PRN Reason: Blood Transfusion Line Care  Start: 09/16/23 1740 End: 09/17/23 1739                    Physical Exam     Vital Signs:   Temp:  [36.1 ?C (97 ?F)-37.1 ?C (98.8 ?F)] 36.1 ?C (97 ?F)  Heart Rate:  [64-111] 73  Resp:  [17-18] 17  BP: (77-132)/(49-85) 107/81  NBP Mean:  [62-93] 84  SpO2:  [89 %-100 %] 99 %       I&O's:   I/O last 2 completed shifts:  In: 2385 [P.O.:645; Blood:690; IV Piggyback:1050]  Out: 1150 [Urine:1150] Oxygen Support:  Oxygen Therapy  SpO2: 99 %  O2 Device: Nasal cannula  Flow Rate (L/min): 2 L/min           Weight for the past 720 hrs (Last 4 readings):   Weight   09/17/23 0500 88.8 kg (195 lb 12.3 oz)   09/15/23 0530 88.1 kg (194 lb 3.6 oz)   09/14/23 0412 88.9 kg (195 lb 15.8 oz)   09/13/23 0435 88.6 kg (195 lb 5.2 oz)         System Normal Findings Abnormal Findings   General [x]  No acute distress  [x]  Well-appearing  []  Conversant   []  In acute distress  []  Chronically ill appearing  []  Sedated           []  Non Verbal  []  Other:   HENMT/Neck []  Sclera anicteric []  EOMI  [x]  Hearing grossly intact  []  Oropharynx clear and moist  []  No JVP []  Scleral icterus  []  Difficulty hearing  []  Dry mucous membranes  []  JVP ( cm)  []  Other:   Resp []  Normal effort on RA  [x]  CTA B/L []  Intubated                 []  On Supp O2 ()  []  Wheezing ()  []  Rales ()   []  Crackles ()  [x]  Other: weaned to RA on exam    CV [x]  Normal rhythm/rate  []  No murmurs  [x]  No lower extremity edema  []  Normal PMI   Normal pulses:   []  Radial []  Femoral  []  Pedal []   Abnormal rhythm ()  []   Murmur ()  []   S3    []  S4  Abnormal Pulses:  []  Radial ()  []  Femoral ()   []  DP ()  []  PT ()  []  Lower extremity edema       []  R ()   []  L ()  []  Bilateral ()  []  Other:   GI [x]  Soft  []  No tenderness to palpation  []  No rebound/guarding  []  Non-distended []  Tenderness ()  []  Fluid Wave  []  Distended   []  Hepatomegaly  Other:   MSK []  Normal strength/tone  [x]  Moving all extremities []  Other:    Skin [x]  Warm and dry  [x]  No rash or ecchymoses []  Other:   Neuro [x]  AO x 3  [x]  Answering questions appropriately  []  Fluent speech  []  Face symmetric  []  Normal strength  []  Normal sensation  []  Normal reflexes  []  Normal cerebellar exam   []  AO x 0       []  AO x 1     []  AO x 2    Abnormal neuro exam:  []  Abnormal strength  []  Abnormal sensation  []  Abnormal reflexes  []  Abnormal cerebellar exam    Other:         Labs/Studies     Labs  Recent Labs     09/17/23  0434 09/16/23  2318 09/16/23  1746   WBC 9.59 10.20* 10.46*   HGB 12.1* 12.8* 12.8*   HCT 36.4* 40.8 40.0   PLT 100* 102* 108*     Recent Labs     09/17/23  0434 09/16/23  0826 09/16/23  0655   NA 139 138 139   K 4.4 5.0 5.1   CL 105 103 102   CO2 24 26 24    ANIONGAP 10 9 13    BUN 20 20 17    CREAT 0.72 0.78 0.84   GLUCOSE 104* 129* 116*   CALCIUM 8.3* 9.0 9.1   MG 2.1*  --  1.7      Recent Labs     09/17/23  0434 09/16/23  0826   ALT 44 71*   AST 23 34 BILITOT 0.4 0.4   ALKPHOS 63 79   TOTPRO 6.2 7.1   ALBUMIN 3.4* 3.9     Recent Labs     09/16/23  1918   PT 13.8   INR 1.1     No results for input(s): ''DDIMER'', ''FIBRINOGEN'' in the last 72 hours.  No results for input(s): ''TSH'', ''HGBA1C'' in the last 72 hours.  No results for input(s): ''FE'', ''TIBC'', ''FEBINDSAT'', ''FERRITIN'' in the last 72 hours.  No results for input(s): ''FOLATE'', ''VITAMINB1'', ''VITAMINB12'', ''VITAMINB6'', ''VITD125DIOH'', ''VITAMINC'', ''ZINCSER'' in the last 72 hours.  No results for input(s): ''CHOL'', ''CHOLHDL'', ''CHOLDLCAL'', ''CHOLDLQ'', ''TRIGLY'' in the last 72 hours.  Recent Labs     09/17/23  0651 09/16/23  1918   LACTATE 10 19*     No results for input(s): ''PROCAL'', ''CRP'', ''SRWEST'' in the last 72 hours.  No results for input(s): ''BLDUR'', ''KETONESUR'', ''PROTUR'', ''LEUKESTUR'', ''NITRITEUR'', ''RBCHPF'', ''WBCHPF'' in the last 72 hours.  No results for input(s): ''PHVEN'', ''PCO2VEN'', ''PO2VEN'', ''BICARBVEN'', ''BEVEN'' in the last 72 hours.    Invalid input(s): ''PO2SATESTVEN''    Imaging  Last 24 hours of interval imaging personally reviewed and compared to prior imaging.  Echo adult transthoracic w definity  Result Date: 09/16/2023  :  1. Technically very difficult study with suboptimal views.  2. Small LV size and severe concentric left ventricular hypertrophy.  3. Hyperdynamic LV systolic function. Normal left ventricular regional wall motion. Left ventricular ejection fraction is approximately >75%.  4. Mild LV diastolic dysfunction (grade I, impaired relaxation).  5. Normal right ventricular size. Normal RV systolic function.  6. A 25 mm Carpentier bioprosthetic valve is present in the aortic valve position. Echo findings are consistent with normal structure and function of the aortic prosthesis.  7. A prior echo performed on 08/08/2023 was reviewed for comparison. No significant changes noted since the previous study. FINDINGS: LEFT VENTRICLE: Left ventricular endocardium was not well-visualized. Recommend echo contrast for future studies. Small left ventricular size. Severe concentric left ventricular hypertrophy. Hyperdynamic systolic function. Visually estimated left ventricular ejection fraction >75%. Patient demonstrated normal sinus rhythm during echocardiogram. Mild LV diastolic dysfunction (grade I, impaired relaxation). No thrombus was visualized within the left ventricle. LV Wall Motion: All segments are normal. RIGHT VENTRICLE: Normal right ventricular size. Normal RV systolic function. LEFT ATRIUM: Normal left atrial size. RIGHT ATRIUM: Normal right atrial size. Right atrial pressure is estimated at 3 mmHg. MITRAL VALVE: Thickened mitral valve leaflets. No mitral annular calcification. No evidence of mitral valve regurgitation. TRICUSPID VALVE: The tricuspid regurgitation jet was not well seen. AORTIC VALVE: A 25 mm Carpentier bioprosthetic valve is present in the aortic valve position. Echo findings are consistent with normal structure and function of the aortic prosthesis. Normal prosthetic valve function. The aortic valve area is 1.20 cm (Indexed AVA is 0.61 cm/m). The aortic valve maximum velocity is 2.4 m/s. The peak gradient across the aortic valve is 22 mmHg. The mean gradient is 11 mmHg. PULMONIC VALVE: The pulmonic valve was not well visualized. No indication of pulmonic valve regurgitation. AORTA: Normal aortic root (3.5 cm). Normal mid ascending aorta (3.5 cm). SYSTEMIC VEINS: The inferior vena cava is normal in size and exhibits greater than 50% respiratory change. PERICARDIUM: There is no evidence of pericardial effusion.  2D AND M-MODE MEASUREMENTS: Left Ventricle:         Aorta/Left Atrium: IVSd (2D):      2.0 cm  Aortic Root, d (2D): 3.5 cm LVPWd (2D):     2.1 cm  Right Ventricle: LVIDd (2D):     2.4 cm  RV basal minor, A4C: 3.7 cm LV RWT:         1.78    RV Mid-cavity:       2.8 cm LV mass:        221     RV major, A4C:       6.6 cm LV SYSTOLIC FUNCTION BY 2D PLANIMETRY (MOD): WMSI: 1.0  LV DIASTOLIC FUNCTION: MV Peak E: 49 cm/s  e' lateral: 6.2 cm/s E/e' lateral: 7.9 MV Peak A: 123 cm/s Decel Time: 172 msec E/A Ratio: 0.4 RA Volumes: RA VOL A4C:        26 ml    RA Vol A4C index: 13 ml/m RA area: RA area s, MOD A4C 11.9 cm RA vol s, MOD A4C 26.5 ml LA Volumes: LA Vol A4C:       24 ml    LA Vol A4C index: 12 ml/m LA area: LA area s MOD A4C 12.6 cm LA vol s, MOD A4C 24 ml Aortic Valve: AoV Max Vel: 2.4 m/s AoV Peak PG: 22 mmHg AoV Mean PG: 11 mmHg LVOT Vmax: 0.81 m/s LVOT VTI: 12.0 cm  AoV VTI:       31.4 cm LVOT SV:   38 ml    LVOT SI:  19 ml/m LVOT  Diameter: 2.0 cm AoV Area, Vmax: 1.08 cm AoV Area, VTI:  1.20 cm                          AoV Area index: 0.61 cm/m Dimensionless index: 0.38 Tricuspid Valve and PA/RV Systolic Pressure: RA Pressure: 3 mmHg Aorta: Ao Asc: 3.5 cm Systemic Veins: IVC Diameter: 1.95 cm  [id number] Rachele Haste MD Electronically signed by [id number] Rachele Haste MD on 09/16/2023 at 1:38:41 PM cc: [phone] [facility]   Final       Microbiology  Last 24 hours of interval microbiology data reviewed.    Blood:  No results found for: ''BACULBLD''  No results found for: ''HEPAABIGM'', ''HEPAABTOT''  No results found for: ''HEPAABIGM'', ''HEPAABTOT''  HBs Ag   Date Value Ref Range Status   01/25/2022 Nonreactive Nonreactive Final     No results found for: ''HEPBEAB'', ''HEPBEAG''  HCV Ab Screen   Date Value Ref Range Status   01/25/2022 Nonreactive Nonreactive Final     No results found for: ''HIVQPCR''  No results found for: ''MTBQFNGOLD''    Urine:  No results found for: ''BACULUR''    Respiratory:  No results found for: ''BACULRSP''   Lab Results   Component Value Date    COVID19QLPCR Not Detected 02/11/2019     No results found for: ''RSVAGDIR''        A&P   Marrion Accomando is a 68 y.o. male 68 y.o. 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      #Melena  Picture in chart and HgB drop.  - Held [facility] and HTN medications  - GI consulted, appreciate recs   - NPO for endoscopy this afternoon   - IV PPI BID       #Tachy/SVTGLENWOOD Cleaves Arrhythmia  #pAfib  Initial fib diagnosis after valve replacement in 2013. Then no recurrence and so was not on AC. Two admissions 08/2023 second of which had recurrence of fib and managed with dilt and apix. S/P PPM TTE showed no pericardial effusion.  - Amio 200 bid x 2 weeks (6/11-6/24) and then 200 every day after per EP  - Resume apix 5mg  BID when cleared from GIB  - s/p PPM placement on 6/14      #Hx bicupsid AV sp surgical bioprosthetic AV (2013) :   - hold ASA   - will need one time eval to rule out coarct, can be done outpt  - family screening      #Hx periop CVA from AVR in 2013   Noted to have recovered function significant amount of function but continues to feel off balance and only 4/5 strength in LLE   - Resume ASA when stable   - Atorvastatin  - PT/OT after PPM     #discharge planning  - PT/OT to assess home mobility needs     #HTN:  - Holding home Amlodipine 5mg    - Holding home losartan      #HLD:   - Atorvastatin     #Gout:   - Allopurinol      #Osteoporosis   #Vit D- Deficiency   - Continue on Vit Supplements      #HX of medial malleolus fracture   -CAM boot as tolerated       # IP Checklist  - Diet: Diet NPO Reason for  NPO: Procedure; Except for: Medications  - VTE PPX: SCDs  - LDA:   Peripheral IV 22 G Anterior;Right;Medial Forearm (7)  Peripheral IV 22 G Anterior;Distal;Right Arm (6)  Peripheral IV 20 G Anterior;Proximal;Right Arm (6)  Peripheral IV 22 G Anterior;Left;Proximal Forearm (3)    Advance Care Planning:   Full Code,  Primary Emergency Contact: Herold Lanny Ip, Home Phone: [phone], Mobile Phone: [phone]    Disposition:  Expected post-hospitalization disposition will be to Home      Author  Delon Llanos, NP   [facility] Hospitalist   Pager [phone]  09/17/2023 at 10:48 AM    Discussed with Attending Jackson Dorn FALCON., MD MEDICINE ATTENDING ADDENDUM:    DATE OF SERVICE: 09/17/2023    I have seen and examined the patient on the date of service noted above and discussed the patient's management with the nurse practitioner. I have reviewed the NP's note, reviewed medications, reviewed all laboratory and diagnostic studies outlined above, and agree with the documented findings, impression, and plan of care, which we formulated together, along with my additions and/or clarifications.    No further episodes of melena overnight; VSS. Patient notes that he had just recently started Eliquis about one month ago at OSH for tachy/brady syndrome. Was not on [facility] prior to that. Last EGD was in the [place] many years ago which was due due to dyspepsia - does not recall any abnormal findings. Endorses a recently fractured R ankle for which he was prescribed Celebrex but only took one dose. Has been taking Tylenol as needed for pain otherwise; no other NSAID use. Drinks about one drink per day with dinner, no recent history of binge drinking. Prior tob use, quit about 25 years ago. Denies hematochezia, dizziness, or presyncope.    Pending EGD today -> will discuss timing of resumption of AC and ASA depending on results.    56  minutes of non-overlapping time were spent, representing greater than 50% of of the total MD/NP visit time of 95 minutes, which include today's pre-visit review of the chart, obtaining appropriate history, performing an evaluation, documentation and discussion of management with details supported within the note for today's visit. The time documented was exclusive of any time spent on the separately billed procedure.    Dorn Jackson, MD  Hospitalist - Internal Medicine  Pager: [phone]

## 2023-09-17 NOTE — Progress Notes
 .  CCU to [facility] Transfer Note       PMD: Jeana Francisco, MD  DATE OF SERVICE: 09/17/2023  HOSPITAL DAY: 8  CHIEF COMPLAINT: No chief complaint on file.      ID: Andrew [name] is a 68 y.o. male 68 y.o. w/ h/o bicuspid AV sp TAVR 2013 (bioprosthetic) cb CVA w residual LLE weakess, afib, presented to OSH w pre-syncope s/p PPM 6/14 now with melena     Hospital Course      Hospital Course:     Patient initially presented to OSH on 6/1 for sycope and found to have a tachy-brady arrhythmia and was transferred to [facility] for pacemaker assessment. During hospital course, patient found to have SVT with sinus bradycardia. Started on amiodarone gtt and heparin gtt to control symptoms. PPM was placed on 6/14 without complications and no ACC for 48 hours. When restarted on apixaban patient was noted to have one episode of melena and then went into afib with RVR. Afib resolved with fluids and transfusion, but HgB noted to not have increased appropriately with fluids. GI consulted and plan to scope for GIB.     Subjective/Review of Systems   Patient feels well ON. No concern for palpitations.     14-point ROS negative except as outlined above.    Medications     Scheduled:  allopurinol, 200 mg, Oral, Daily  amiodarone, 200 mg, Oral, BID  ascorbic acid, 500 mg, Oral, Daily with breakfast  atorvastatin, 20 mg, Oral, Nightly  calcium carbonate 1250 mg (500 mg elemental), 1,250 mg, Oral, Daily with breakfast  pantoprazole, 40 mg, IV Push, Q12H  cholecalciferol, 25 mcg, Oral, Daily   Continuous:       PRN:  sodium chloride      Physical Exam   Temp:  [36.1 ?C (97 ?F)-37.1 ?C (98.8 ?F)] 36.9 ?C (98.5 ?F)  Heart Rate:  [64-111] 83  Resp:  [17-18] 17  BP: (77-132)/(49-90) 113/77  NBP Mean:  [62-102] 89  SpO2:  [89 %-100 %] 98 %   UOP: Urine:  [100 mL-400 mL] 400 mL I/Os:   Intake/Output Summary (Last 24 hours) at 09/17/2023 0803  Last data filed at 09/17/2023 9488  Gross per 24 hour   Intake 2385 ml   Output 1150 ml   Net 1235 ml Gen: Well-developed male in no acute distress  Eyes: PERRL, EOMI  ENT: OP clear, MMM  Neck: Supple, trachea midline, no cervical or supraclavicular lymphadenopathy  CV: Intermittently tachy then bradycardic   Pulm: CTAB, no wheezes/rales/rhonchi, normal work of breathing  Abd: NABS, soft, flat, NT, no masses or organomegaly  Skin: Warm and dry, no rashes  Ext: No cyanosis or edema. Distal pulses 2+ and symmetric  Neuro: A&Ox4, CNII-XII intact, 4/5 strength in LLE, 5/5 strength in all other ext, sensation grossly intact    Laboratory Data     CBC:  Recent Labs     09/17/23  0434 09/16/23  2318 09/16/23  1746   WBC 9.59 10.20* 10.46*   NEUTABS 5.74 6.64 6.25   HGB 12.1* 12.8* 12.8*   HCT 36.4* 40.8 40.0   MCV 87.5 94.7 88.1   PLT 100* 102* 108*     BMP:  Recent Labs     09/17/23  0434 09/16/23  0826 09/16/23  0655   NA 139 138 139   K 4.4 5.0 5.1   CL 105 103 102   CO2 24 26 24    BUN 20 20 17  CREAT 0.72 0.78 0.84   GLUCOSE 104* 129* 116*   CALCIUM 8.3* 9.0 9.1   MG 2.1*  --  1.7     LFT:  Recent Labs     09/17/23  0434 09/16/23  0826   TOTPRO 6.2 7.1   ALBUMIN 3.4* 3.9   BILITOT 0.4 0.4   ALT 44 71*   AST 23 34   ALKPHOS 63 79     COAGS:  Recent Labs     09/16/23  1918   INR 1.1   PT 13.8       UA:  No results for input(s): ''SPECGRAVUR'', ''PHUR'', ''BLDUR'', ''KETONESUR'', ''GLUCOSEUR'', ''PROTCLUR'', ''NITRITEUR'', ''LEUKESTUR'', ''RBCSUR'', ''WBCSUR'' in the last 72 hours.    Microbiology     No results found for this or any previous visit (from the past week).       Imaging     Echo adult transthoracic w definity  Result Date: 09/16/2023  TRANSTHORACIC ECHOCARDIOGRAM REPORT  -------------------------------------------------------------------------------- Patient Name:   Andrew [name] Date of Exam:     09/16/2023 Medical Rec #:  [id number]        Patient Type:     IP Accession #:    [id number]       Patient Location: 7N Date of Birth:  05/11/1955      Height:           66.0 in Patient Age:    68 years       Weight:           194.2 lb Patient Gender: M              BSA:              1.98 m                                BP:               125/90  -------------------------------------------------------------------------------- Diagnosis:  Z95.2-S/P AVR (aortic valve replacement) Indication: s/p AVR Referring Provider: [id number] [facility] Sonographer 1:      Danelle Ka Report prepared/completed by  Danelle Ka on 09/16/2023 at 10:31:21 AM Add-On Procedures Performed:  Definity (perflutren lipid microsphere) study                               (03625, V0042). At total of 2 ml of Definity was                               used to delineate left ventricular endocardial                               borders. Other Study Information:      Technically difficult study. Technically                               challenging study related to the patients body                               habitus.  --------------------------------------------------------------------------------    :  1. Technically very difficult study with suboptimal views.  2. Small LV size and severe  concentric left ventricular hypertrophy.  3. Hyperdynamic LV systolic function. Normal left ventricular regional wall motion. Left ventricular ejection fraction is approximately >75%.  4. Mild LV diastolic dysfunction (grade I, impaired relaxation).  5. Normal right ventricular size. Normal RV systolic function.  6. A 25 mm Carpentier bioprosthetic valve is present in the aortic valve position. Echo findings are consistent with normal structure and function of the aortic prosthesis.  7. A prior echo performed on 08/08/2023 was reviewed for comparison. No significant changes noted since the previous study. FINDINGS: LEFT VENTRICLE: Left ventricular endocardium was not well-visualized. Recommend echo contrast for future studies. Small left ventricular size. Severe concentric left ventricular hypertrophy. Hyperdynamic systolic function. Visually estimated left ventricular ejection fraction >75%. Patient demonstrated normal sinus rhythm during echocardiogram. Mild LV diastolic dysfunction (grade I, impaired relaxation). No thrombus was visualized within the left ventricle. LV Wall Motion: All segments are normal. RIGHT VENTRICLE: Normal right ventricular size. Normal RV systolic function. LEFT ATRIUM: Normal left atrial size. RIGHT ATRIUM: Normal right atrial size. Right atrial pressure is estimated at 3 mmHg. MITRAL VALVE: Thickened mitral valve leaflets. No mitral annular calcification. No evidence of mitral valve regurgitation. TRICUSPID VALVE: The tricuspid regurgitation jet was not well seen. AORTIC VALVE: A 25 mm Carpentier bioprosthetic valve is present in the aortic valve position. Echo findings are consistent with normal structure and function of the aortic prosthesis. Normal prosthetic valve function. The aortic valve area is 1.20 cm (Indexed AVA is 0.61 cm/m). The aortic valve maximum velocity is 2.4 m/s. The peak gradient across the aortic valve is 22 mmHg. The mean gradient is 11 mmHg. PULMONIC VALVE: The pulmonic valve was not well visualized. No indication of pulmonic valve regurgitation. AORTA: Normal aortic root (3.5 cm). Normal mid ascending aorta (3.5 cm). SYSTEMIC VEINS: The inferior vena cava is normal in size and exhibits greater than 50% respiratory change. PERICARDIUM: There is no evidence of pericardial effusion.  2D AND M-MODE MEASUREMENTS: Left Ventricle:         Aorta/Left Atrium: IVSd (2D):      2.0 cm  Aortic Root, d (2D): 3.5 cm LVPWd (2D):     2.1 cm  Right Ventricle: LVIDd (2D):     2.4 cm  RV basal minor, A4C: 3.7 cm LV RWT:         1.78    RV Mid-cavity:       2.8 cm LV mass:        221     RV major, A4C:       6.6 cm LV SYSTOLIC FUNCTION BY 2D PLANIMETRY (MOD): WMSI: 1.0  LV DIASTOLIC FUNCTION: MV Peak E: 49 cm/s  e' lateral: 6.2 cm/s E/e' lateral: 7.9 MV Peak A: 123 cm/s Decel Time: 172 msec E/A Ratio: 0.4 RA Volumes: RA VOL A4C:        26 ml    RA Vol A4C index: 13 ml/m RA area: RA area s, MOD A4C 11.9 cm RA vol s, MOD A4C 26.5 ml LA Volumes: LA Vol A4C:       24 ml    LA Vol A4C index: 12 ml/m LA area: LA area s MOD A4C 12.6 cm LA vol s, MOD A4C 24 ml Aortic Valve: AoV Max Vel: 2.4 m/s AoV Peak PG: 22 mmHg AoV Mean PG: 11 mmHg LVOT Vmax: 0.81 m/s LVOT VTI: 12.0 cm  AoV VTI:       31.4 cm LVOT SV:   38 ml    LVOT  SI:  19 ml/m LVOT Diameter: 2.0 cm AoV Area, Vmax: 1.08 cm AoV Area, VTI:  1.20 cm                          AoV Area index: 0.61 cm/m Dimensionless index: 0.38 Tricuspid Valve and PA/RV Systolic Pressure: RA Pressure: 3 mmHg Aorta: Ao Asc: 3.5 cm Systemic Veins: IVC Diameter: 1.95 cm  [id number] Rachele Haste MD Electronically signed by [id number] Rachele Haste MD on 09/16/2023 at 1:38:41 PM cc: [phone] [facility] ZHAO   Final          Assessment/Plan   Bohden Dung is a 68 y.o. male 68 y.o. 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     #Melena  Picture in chart and HgB drop.  - Held [facility] and HTN medications  - NPO for procedure   - IV PPI BID    - GI consulted: plan for scope 6/17    #Tachy/SVT- Nola Arrhythmia  #pAfib  Initial fib diagnosis after valve replacement in 2013. Then no recurrence and so was not on AC. Two admissions 08/2023 second of which had recurrence of fib and managed with dilt and apix. S/P PPM TTE showed no pericardial effusion.  - Amio 200 bid x 2 weeks (6/11-6/24) and then 200 every day after per EP   - Holding while ongoing GIB work up   - Resume apix 5mg  BID when cleared from GIB  - s/p PPM placement on 6/14      #Hx bicupsid AV sp surgical bioprosthetic AV (2013) :   - ASA   - will need one time eval to rule out coarct, can be done outpt  - family screening      #Hx periop CVA from AVR in 2013   Noted to have recovered function significant amount of function but continues to feel off balance and only 4/5 strength in LLE - Resume ASA when stable   - Atorvastatin  - PT/OT after PPM    #discharge planning  - PT/OT to assess home mobility needs     #HTN:  - Holding home Amlodipine 5mg    - Holding home losartan      #HLD:   - Atorvastatin     #Gout:   - Allopurinol      #Osteoporosis   #Vit D- Deficiency   - Continue on Vit Supplements      #HX of medial malleolus fracture   -CAM boot as tolerated     Inpatient Checklist:  Diet: Orders Placed This Encounter      Diet NPO Reason for NPO: Procedure; Except for: Medications    DVT Prophylaxis: None   GI Prophylaxis: not indicated  Central Lines: none  Tubes/Drains: none    Disposition:     Code Status:   Full Code  Goals of Care Discussed: code status addressed    Contact:  Primary Emergency Contact: B[place], [place], Home Phone: [phone]    Discussed with attending, Dr. Kenn.     I have interviewed and examined the patient with the resident/fellow physician on the date of service. All labs, studies, and medications were independently reviewed by me. I have reviewed the note above and agree with the history, physical, assessment and plan which we formulated together. The patient was informed of the above plan, demonstrated understanding and was  agreeable to the plan as written above.    35 minutes were spent personally by me today on this encounter which include today's pre-visit review of the chart, obtaining appropriate history, performing an evaluation, documentation and discussion of management with details supported within the note for today's visit. The time documented was exclusive of any time spent on procedures/device interrogations, if applicable.     Zeke LOIS Kearns, MD  General Cardiology  Advanced Heart Failure and Transplant Cardiology   [facility]

## 2023-09-17 NOTE — Consults
 IP CM ACTIVE DISCHARGE PLANNING  Department of Care Coordination      Admit Ijuz:[id number]  Anticipated Date of Discharge: 09/19/2023    Following FI:Hjmrpj, Dorn FALCON., MD      Today's short update        09/17/23 1002   MD/NP   Today's progression of care GI bleed; downgrade to [facility]; Discharge planning: home health PT/OT/SN and DME commode   Case Management   Expected Disposition Home Health   Post-Acute Recommendations DME;Nursing   Discharge Address [address]  [place] [address]   Home Health Coordination Status Referral under review (4/7)   DME or RT Equipment Status Referral sent-out to providers (via [facility]) (3/7)         Disposition     Home w/Home Health  Durable Medical Equipment, Nursing  (P) [address]  [place] [address]  Family/Support System in agreement with the current discharge plan: No, they are not in agreement (Comment on Barrier)    Multidisciplinary Team Member Plan of Care   Interdisciplinary rounds were conducted with the multidisciplinary team including the clinical social worker and nurse case manager. The patient's plan of care and discharge plan were discussed and formulated based on the patient's specific needs.    Home Health Coordination Status (if applicable)     (P) Referral under review (4/7)    DME or RT Equipment Status (if applicable)     (P) Referral sent-out to providers (via [facility]) (3/7)    Other Arrangements (if applicable)     CM Handoff Report    Patient transferring to: [facility] Team 6    Past Medical History:   Diagnosis Date    CVA (cerebral vascular accident) (HCC/RAF)     H/O aortic valve replacement     Kidney stone     Urinary frequency         Past Surgical History:   Procedure Laterality Date    AORTIC VALVE REPLACEMENT              Needs assessment Completed                               Utilization Review (including payor communication) Current:                     Yes                          Admit status Appropriate CM Handoff GI bleed   Disposition Please follow up with home health PT/OT/SN and DME commode   Home Health Home Health/Hospice Orders            No orders found for display            DME DME orders placed this encounter   Procedures    DME Commode (bedside 3 in 1)     Is this DME order for Inpatient Stay or Discharge?:   Discharge    Commode             Tavi Hoogendoorn  DNP RN CNL     Department of Care Coordination and Clinical Social Work      Medicine Case Manager    Primary Team: [place]    T: [phone]    Pager: [phone]    Email: [email or web address]      --------------------------------------------------------------------------------------------------------------------------  For  Weekend/Holiday/Swing Shift Case Management assistance contact [phone])  For assistance after hours:  -PM Case Manager: Rollene Dimes RN-CM work cell: [phone])  -PM Case Manager: Skipper Chihuahua RN-CM phone: [phone]  -ER Case Manager: [phone])  -Dorise GLENWOOD [facility] DME Liaison can be reached at [phone] or email: [email or web address]

## 2023-09-17 NOTE — Other
 [name] Clinical Goal:   Clinical Goal(s) for the Shift: VSS, stable cardiac rhythm, monitor hgb, keep pt hemodynamically stable, no c/p of cp/sob/n/v/pain, monitor new ICD site, replace lytes prn, maintain comfort and safety  Identify possible barriers to advancing the care plan: none   Stability of the patient: Moderately Unstable - medium risk of patient condition declining or worsening    Progression of [name] Clinical Goal:A&OX 4, BMAT 2, afebrile, denies n/v & pain. NSR  with BBB on the monitor, HR 70-80s, SBP 120s. Ppm dressing c/d/I. 2L NC with SpO2>95%. EGD completed, gastric ulcer clipped. Heparin infusing at 710 units/hr. Comfort and safety maintained.       Plan:   Planning DC home with home health

## 2023-09-18 LAB — Comprehensive Metabolic Panel
BILIRUBIN,TOTAL: 0.5 mg/dL (ref 0.1–1.2)
CALCIUM: 8.5 mg/dL — ABNORMAL LOW (ref 8.6–10.4)
UREA NITROGEN: 11 mg/dL (ref 7–22)

## 2023-09-18 LAB — APTT
APTT: 29.9 s (ref 24.4–36.2)
APTT: 65.5 s — ABNORMAL HIGH (ref 24.4–36.2)
APTT: 120.6 s — ABNORMAL HIGH (ref 24.4–36.2)
APTT: 176.3 s — CR (ref 24.4–36.2)
APTT: 73.5 s — ABNORMAL HIGH (ref 24.4–36.2)

## 2023-09-18 LAB — Prothrombin Time Panel: PROTHROMBIN TIME: 13.5 s (ref 11.5–14.4)

## 2023-09-18 LAB — Magnesium: MAGNESIUM: 1.7 meq/L (ref 1.4–1.9)

## 2023-09-18 LAB — Differential Automated
ABSOLUTE BASO COUNT: 0.03 10*3/uL (ref 0.00–0.10)
ABSOLUTE EOS COUNT: 0.22 10*3/uL (ref 0.00–0.50)
ABSOLUTE LYMPHOCYTE COUNT: 1.76 x10E3/uL (ref 1.30–3.40)

## 2023-09-18 LAB — CBC
NEUTROPHILS ABS (PRELIM): 2.74 x10E3/uL (ref 143–398)
RED CELL DISTRIBUTION WIDTH-SD: 44.5 fL (ref 36.9–48.3)
RED CELL DISTRIBUTION WIDTH-SD: 44.4 fL (ref 36.9–48.3)
RED CELL DISTRIBUTION WIDTH-SD: 45.5 fL (ref 36.9–48.3)

## 2023-09-18 MED ADMIN — MAGNESIUM SULFATE 2 GM/50ML IV SOLN: 2 g | INTRAVENOUS | @ 15:00:00 | Stop: 2023-09-19 | NDC 70121171901

## 2023-09-18 MED ADMIN — ALLOPURINOL 100 MG PO TABS: 200 mg | ORAL | @ 15:00:00 | Stop: 2023-09-22 | NDC 60687067711

## 2023-09-18 MED ADMIN — OYSTER CALCIUM 1250 (500 CA) MG PO TABS: 1250 mg | ORAL | @ 15:00:00 | Stop: 2023-09-22

## 2023-09-18 MED ADMIN — AMIODARONE HCL 200 MG PO TABS: 200 mg | ORAL | @ 15:00:00 | Stop: 2023-09-19 | NDC 60687043711

## 2023-09-18 MED ADMIN — HEPARIN (PORCINE) IN NACL 25000-0.45 UT/250ML-% IV SOLN: 260 [IU]/h | INTRAVENOUS | @ 21:00:00 | Stop: 2023-09-19 | NDC 00409765005

## 2023-09-18 MED ADMIN — CHOLECALCIFEROL 25 MCG (1000 UT) PO TABS: 25 ug | ORAL | @ 15:00:00 | Stop: 2023-09-22

## 2023-09-18 MED ADMIN — AMIODARONE HCL 200 MG PO TABS: 200 mg | ORAL | @ 03:00:00 | Stop: 2023-09-19 | NDC 60687043711

## 2023-09-18 MED ADMIN — PANTOPRAZOLE SODIUM 40 MG IV SOLR: 40 mg | INTRAVENOUS | @ 12:00:00 | Stop: 2023-09-18 | NDC 71288060010

## 2023-09-18 MED ADMIN — ASCORBIC ACID 500 MG PO TABS: 500 mg | ORAL | @ 15:00:00 | Stop: 2023-09-22 | NDC 00904052361

## 2023-09-18 MED ADMIN — HEPARIN (PORCINE) IN NACL 25000-0.45 UT/250ML-% IV SOLN: 710 [IU]/h | INTRAVENOUS | @ 18:00:00 | Stop: 2023-09-18

## 2023-09-18 MED ADMIN — ATORVASTATIN CALCIUM 20 MG PO TABS: 20 mg | ORAL | @ 03:00:00 | Stop: 2023-09-22 | NDC 68084009811

## 2023-09-18 MED ADMIN — HEPARIN (PORCINE) IN NACL 25000-0.45 UT/250ML-% IV SOLN: 710 [IU]/h | INTRAVENOUS | @ 15:00:00 | Stop: 2023-09-18

## 2023-09-18 NOTE — Progress Notes
 Occupational Therapy Treatment    PATIENT: Andrew [name]  MRN: [id number]    Treatment Date: 09/18/2023    Patient Presentation: Position: In bed;Family/CG present (Spouse at bedside)  Lines/devices Drains: HLIV;Cardiac Monitor;Pulse Ox    Pertinent Updates: S/p upper GI endoscopy on 6/17.    Precautions   Precautions: Fall risk;Monitor Vitals;Check Labs;Other (Comment)  Orthotic: Left;AFO;Right;CAM/walking Boot  Current Activity Order: Activity as tolerated  Weight Bearing Status: Not Applicable    Cognition   Cognition: Exceptions to WDL or Baseline Status  Arousal/Alertness: Appropriate responses to stimuli  Attention Span: Attends with cues to redirect  Following Commands: Follows one step commands with increased time;Follows one step commands with repetition  Problem Solving: Assistance required to identify errors made  Sequencing: Intact (For BADL)  Initiation: Fair  Safety Awareness: Fair awareness of safety precautions  Barriers to Learning: None    Bed Mobility   Rolling: Moderate Assist;Side Rails;Verbal Cueing;to Left  Supine to Sit: Moderate Assist;to Left;Verbal Cueing;Assist for LE(s)  Sit to Supine: Not Performed    Functional Transfers   Sit to Stand: Contact Guard Assist;Assistive Device (Comment) (x1 from elevated EOB, to FWW)  Transfer: From;Bed;To;Chair  Level of Assist: Minimum Assist;Verbal Cueing  Type of Transfer: Stand step;Front wheeled walker;to Left (VC fo L hip/knee flexion during steps to minimize dragging of LLE)  Functional Mobility: Not Performed    Activities of Daily Living (ADLs)   Grooming: Performed (Brush teeth, wash face)  Grooming Assistance: Orthoptist Deficit: Verbal cueing  Grooming Adaptive Equipment: None  Grooming Where Assessed: Chair  LB Dressing: Performed (Don L AFO, R walking boot)  LB Dressing Assistance: Maximum Assist  LB Dressing Deficit: Don/doff R shoe;Don/doff L shoe  LB Dressing Adaptive Equipment: None  LB Dressing Where Assessed: Edge of bed    Balance   Sitting - Static: Good;with UE support  Sitting - Dynamic: Good;with UE support     AM-PAC   AM-PAC Daily Activity Raw Score: 16  AM-PAC Daily Activity t-Scale Score: 35.96  AM-PAC Daily Activity CMS 0-100% Score: 53.32 %  AM-PAC Daily Activity CMS 'G Code' Modifier: CK    Exercises   Other Exercise(s): Seated TKE x 10, seated marching x 10 in prep for STS transfers    Interventions   Balance Training: Sitting reaching activities;Standing weight shifting all planes    Pain Assessment   Patient complains of pain: No    Patient Status   Activity Tolerance: Good  Oxygen Needs: Room Air  Response to Treatment: Tolerated treatment well;Vital signs stable;with activity;Nursing notified  Compliance with Precautions: Fair (PPM)  Call light in reach: Yes  Presentation post treatment: Up in chair;On cardiac monitor;Lines/drains intact;Pulse Ox  Comments: Pt progressing well; anticipates DC'ing home with assistance from spouse.    Interdisciplinary Communication   Interdisciplinary Communication: Nurse;Physical Therapy Assistant    Treatment Plan   Continue OT Treatment Plan with Focus on: ADL training;Functional mobility training;Therapeutic exercise;Patient/family/caregiver education and training;Assistive device training    OT Recommendations   Discharge Recommendation: Occupational Therapy;Would benefit from continued therapy  Discharge concerns: Requires assistance for mobility;Requires assistance for self care  Discharge Equipment Recommended: Commode    Treatment Completed by: Wells Donnelly Ada, OT

## 2023-09-18 NOTE — Progress Notes
 Physical Therapy Treatment      PATIENT: Andrew [name]  MRN: [id number]    Treatment Date: 09/18/2023    Patient Presentation: Position: Family/CG present;Up in chair  Lines/devices Drains: Cardiac Monitor;Pulse Ox;IV/PICC    Pertinent Updates:      Precautions   Precautions: Fall risk;Monitor Vitals;Check Labs;Other (Comment)  Orthotic: Left;AFO;Right;CAM/walking Boot  Current Activity Order: Activity as tolerated  Weight Bearing Status: Not Applicable    Cognition   Cognition: At Baseline Cognitive Status  Safety Awareness: Fair awareness of safety precautions  Barriers to Learning: None      Functional Mobility   Sit to Stand: Minimum Assist;Verbal Cueing (w/ FWW Recliner chair x1, W/c x2)  Transfer(s) Performed: 2  Transfer #1: From;Chair;To;Wheelchair  Transfer #1 Level of Assist: Minimum Assist;Verbal Cueing  Transfer #1 Type: Stand step;to Left  Transfer #1 Asst Device: Front wheeled walker  Transfer #2: From;Wheelchair;To;Chair  Transfer #2 Level of Assist: Minimum Assist;Verbal Cueing  Transfer #2 Type: Stand step;to Left  Transfer #2 Asst Device: Front wheeled walker  Ambulation: Minimum Assist;Verbal Cueing  Ambulation Distance (Feet): 50'x2  Gait Pattern: Decreased stride length;Decreased pace;Impaired Sequencing;Decreased weight shift;Wide Base of Support  Assistive Device: Front wheeled walker     Balance   Sitting - Static: with UE support;Good  Sitting - Dynamic: Able to weight shift anteriorly;Able to weight shift posteriorly;Able to weight shift laterally;with UE support;Fair  Standing - Static: with UE support;Fair  Standing - Dynamic: No LOB;with UE support;Fair         AM-PAC   AM-PAC Basic Mobility Raw Score: 14  AM-PAC Basic Mobility t-Scale Score: 35.55  AM-PAC Basic Mobility CMS 0-100% Score: 53.86 %    Neurological (if indicated)   Neuro Deficits: No    Exercises   Other Exercise(s): Sit <> Stands x3       Pain Assessment   Patient complains of pain: Yes  Pain Quality: Aching  Pain Scale Used: Numeric Pain Scale  Pain Intensity: Patient unable to rate  Pain Location: Right;Foot  Action Taken: Nursing notified;Therapy techniques to manage pain;Positioning      Patient Status   Activity Tolerance: Fair  Oxygen Needs: Room Air  Response to Treatment: Tolerated treatment well;Fatigued;with activity;Nursing notified;Vital signs stable;Asymptomatic;Resolved with rest  Compliance with Precautions: Not Tested  Call light in reach: Yes  Presentation post treatment: Lines/drains intact;Pulse Ox;On cardiac monitor;Family/CG present;Up in chair  Comments: Pt able to tolerate Gait distances in hallway w/ w/c follow. Steady throughout as no LOB noted. Extended rest breaks in between tasks for fatigue to subside.    Interdisciplinary Communication   Interdisciplinary Communication: Nurse;Physical Therapist;Occupational Therapist    Treatment Plan   Continue PT Treatment Plan with Focus on: Bed mobility training;Transfer training;Gait training;Therapeutic exercise    PT Recommendations   Discharge Recommendation: Physical Therapy;Would benefit from continued therapy  Discharge concerns: Requires assistance for mobility;Requires assistance for self care  Discharge Equipment Recommended: Owns recommended DME    Treatment Completed by: Armie Moren Zabalerio Calena Salem, PTA

## 2023-09-18 NOTE — Other
 [name] Clinical Goal:   Clinical Goal(s) for the Shift: VSS, stable cardiac rhythm, no c/o cp/sob/nv/dizziness, manage Heparin with no s/sx of bleeding, replete lytes prn, monitor hgb, monitor i/o, maintain safety and comfort  Identify possible barriers to advancing the care plan: none  Stability of the patient: Moderately Unstable - medium risk of patient condition declining or worsening    Progression of [name] Clinical Goal: AOx4, afebrile, SBP 110s-140s , HR 80s , SpO2 >92% on RA, intermittently on 1L NC, NSR w/ BBB on monitor, denies cp/sob/nv/dizziness. Heparin gtt infusing @ 710 units/hr. K/Mg 4/1.7 ; notified covering MD Sherabi, waiting for response. I/O net -993.6. No BM throughout shift. Adequate rest provided. Call light placed within reach. Maintained safety and comfort.    Plan:  DC planning w/ home health

## 2023-09-18 NOTE — Progress Notes
 Hospitalist Progress Note    Patient Name Andrew [name]   Patient MRN [id number]   Patient DOB [birth year]   Patient PCP Jeana Francisco, MD   Provider Delon IVAR Passer, NP   Admission Date 09/09/2023     Chief Complaint  No chief complaint on file.    Interval Events / Subjective  Overnight: NAEO  -Afebrile, currently on room air. HDS.  -Downtrending anemia since hospitalization, Hgb 15.3 ->>> 12  -Thrombocytopenia, platelet 189 ->>>> 107  -Wife at bedside, discussed transitioned from heparin drip to apixaban and then possibly adding aspirin morning if he continues to tolerate it.  -Wife inquiring about home health own which have all been ordered  -Encouraged pt to work with PT and ambulate as much as possible  -Denies chest pain, cough, shortness of breath, nausea, vomiting, headaches, fever, chills, abdominal discomfort    Review of Systems  A 14 review of systems was completed. Additional symptoms were otherwise negative and/or non-contributory, except as listed above.    Medications  Scheduled:  allopurinol, 200 mg, Oral, Daily  amiodarone, 200 mg, Oral, BID  ascorbic acid, 500 mg, Oral, Daily with breakfast  atorvastatin, 20 mg, Oral, Nightly  calcium carbonate 1250 mg (500 mg elemental), 1,250 mg, Oral, Daily with breakfast  magnesium sulfate IV, 2 g, Intravenous, Once  pantoprazole, 40 mg, IV Push, Q12H  cholecalciferol, 25 mcg, Oral, Daily  Continuous:   heparin 25,000 units/250 mL drip 710 Units/hr (09/18/23 0744)     PRN:    Physical Examination  Vital Signs: Temp:  [36.2 ?C (97.1 ?F)-37 ?C (98.6 ?F)] 37 ?C (98.6 ?F)  Heart Rate:  [67-86] 86  Resp:  [16-18] 18  BP: (101-143)/(52-107) 118/79  NBP Mean:  [77-115] 92  SpO2:  [92 %-99 %] 93 %      Intake/Output Summary (Last 24 hours) at 09/18/2023 0810  Last data filed at 09/18/2023 0416  Gross per 24 hour   Intake 521.42 ml   Output 2165 ml   Net -1643.58 ml     Weight: 90 kg (198 lb 6.6 oz)      Respiratory: Oxygen Therapy  SpO2: 93 %  O2 Device: Nasal cannula  Flow Rate (L/min): 1 L/min      General: Well-appearing male in NAD, sitting comfortably   HEENT:  pupils equal in diameter. Hearing intact grossly, MMM, no oropharyngeal erythema or exudate.  Chest: Nontender. Symmetric. No surgical scars present.  CV: Regular rate & rhythm. No m/r/g's. Extremities warm. Cap refill <2 seconds. Radial and DP 2+ bilaterally.   Lungs:  Good respiratory effort with no increased work of breathing. Chest expands symmetrically. CTAB, no wheezes/crackles  Abdomen: Soft, NT, ND, normoactive bowel sounds.  Extremities: No c/c/e. No lower extremity edema.  Skin: No rashes, warm and well perfused  Neuro: Grossly normal, AAOx3  Psych: Appropriate mood and affect    Labs  Labs were ordered and personally reviewed. Labs were compared to prior records obtained from chart and/or outside records when available.    Recent Labs     09/18/23  0451 09/17/23  2207 09/17/23  1139   WBC 6.09 6.81 7.24   HGB 12.0* 12.2* 11.9*   HCT 35.8* 37.5* 36.3*   PLT 107* 99* 91*     Recent Labs     09/18/23  0451 09/17/23  0434 09/16/23  0826 09/16/23  0655   NA 141 139 138 139   K 4.0 4.4 5.0 5.1   CL 105 105 103  102   CO2 25 24 26 24    ANIONGAP 11 10 9 13    BUN 11 20 20 17    CREAT 0.72 0.72 0.78 0.84   GLUCOSE 101* 104* 129* 116*   CALCIUM 8.5* 8.3* 9.0 9.1   MG 1.7 2.1*  --  1.7      Recent Labs     09/18/23  0451 09/17/23  0434 09/16/23  0826   ALT 38 44 71*   AST 22 23 34   BILITOT 0.5 0.4 0.4   ALKPHOS 66 63 79   TOTPRO 6.5 6.2 7.1   ALBUMIN 3.5* 3.4* 3.9     Recent Labs     09/18/23  0451 09/17/23  2207 09/17/23  1559 09/16/23  1918   APTT 120.6* 65.5* 29.9  --    PT  --   --  13.5 13.8   INR  --   --  1.0 1.1     No results for input(s): ''DDIMER'', ''FIBRINOGEN'' in the last 72 hours.  No results for input(s): ''TSH'', ''HGBA1C'' in the last 72 hours.  No results for input(s): ''FE'', ''TIBC'', ''FEBINDSAT'', ''FERRITIN'' in the last 72 hours.  No results for input(s): ''FOLATE'', ''VITAMINB1'', ''VITAMINB12'', ''VITAMINB6'', ''VITD125DIOH'', ''VITAMINC'', ''ZINCSER'' in the last 72 hours.  No results for input(s): ''CHOL'', ''CHOLHDL'', ''CHOLDLCAL'', ''CHOLDLQ'', ''TRIGLY'' in the last 72 hours.  Recent Labs     09/17/23  0651 09/16/23  1918   LACTATE 10 19*     No results for input(s): ''PROCAL'', ''CRP'', ''SRWEST'' in the last 72 hours.  No results for input(s): ''BLDUR'', ''KETONESUR'', ''PROTUR'', ''LEUKESTUR'', ''NITRITEUR'', ''RBCHPF'', ''WBCHPF'' in the last 72 hours.  No results for input(s): ''PHVEN'', ''PCO2VEN'', ''PO2VEN'', ''BICARBVEN'', ''BEVEN'' in the last 72 hours.    Invalid input(s): ''PO2SATESTVEN''    Procedure  Upper GI endoscopy 09/17/2023 1206  FINDINGS:  Esophagus: The esophagus appeared to be normal without evidence of esophagitis, rings, furrows, strictures. There was no hiatal hernia.  Stomach: There was diffuse ulceration in the antrum, lesser curve. Most areas were 1cm clean based, there was 1 area with a flat pigmented spot vs raised vessel. Hemostasis was performed with epinephrine 1:20,000 by 0.5cc x4. 2 hemoclips were placed over   the site. the surrounding mucosa had slight nodular erythema diffusely. On retroflexion no additional findings were noted.  Duodenum: The duodenum had erythema of the duodenal bulb, otherwise normal in the 2nd portion.     Imaging  Last 24 hours of interval imaging personally reviewed and compared to prior imaging.  No imaging has been resulted in the last 24 hours    Microbiology  Last 24 hours of interval microbiology data reviewed.  Blood:  No results found for: ''BACULBLD''  No results found for: ''HEPAABIGM'', ''HEPAABTOT''  No results found for: ''HEPAABIGM'', ''HEPAABTOT''  HBs Ag   Date Value Ref Range Status   01/25/2022 Nonreactive Nonreactive Final     No results found for: ''HEPBEAB'', ''HEPBEAG''  HCV Ab Screen   Date Value Ref Range Status   01/25/2022 Nonreactive Nonreactive Final     No results found for: ''HIVQPCR''  No results found for: ''MTBQFNGOLD''    Urine:  No results found for: ''BACULUR''    Respiratory:  No results found for: ''BACULRSP''   Lab Results   Component Value Date    COVID19QLPCR Not Detected 02/11/2019     No results found for: ''RSVAGDIR''    Assessment and Plan  Adon Gehlhausen is a 68 y.o. male who has  a past medical history of bicuspid AV s/p bioprosthetic TAVR, CVA w/ residual LLE weakess (s/p TAVR) who initially presented to OSH for pre-syncopal episode found to be tachy-brady s/p PPM. Hospitalization c/b ongoing admission for melena.     #Antral ulceration s/p hemoclips x2 (09/17/2023)  #Melena d/t upper GI ulcer  Picture in chart with ongoing Hgb drop from 15 ->>> 12. 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 Then, pt w/ admissions showing recurrence of Afib and placed on AC  - amiodarone 200 mg BID x 2 weeks (6/11-6/24) and then 200 every day after per EP  - transitioning from hep gtt to apixaban tonight, will need eval for rebleed    # Thrombocytopenia, nPOA  Likely iso of acute bleed/illness  - CTM     [Chronic/stable]:  #HTN  - Hold home Amlodipine 5mg  d/t normotension  - Hold home losartan d/t normotension    #Hx periop CVA from AVR in (2013) s/p residual LLE weakness  #HLD   Noted to have significant functional recovery, however, still with balance issues and 4/5 strength in LLE   - PT/OT evaluation  - Atorvastatin 20 mg at bedtime  - consider resuming ASA 81 mg daily in the AM     #Osteoporosis   #Vit D- Deficiency   #HX of medial malleolus fracture   - calcium carbonate 1250 mg QAM  - vitamin-D 25 mcg daily  - CAM boot as tolerated     #Gout: - Allopurinol 200 mg daily        Inpatient Checklist  Orders Placed This Encounter      Diet clear liquid    DVT prophylaxis: not indicated due to down trending hgb  and platelets  GI Prophylaxis: pantoprazole 40 mg IV BID worsening  Lines/Tubes/Drains: Peripheral IV 22 G Anterior;Right;Medial Forearm (8)  Peripheral IV 22 G Anterior;Distal;Right Arm (7)  Peripheral IV 22 G Anterior;Left;Proximal Forearm (4)      Code Status: Full Code  Contact:  Primary Emergency Contact: Herold Lanny Ip, Home Phone: [phone]    Disposition:  - Pending stabilization of hgb and bleeding while resuming apixaban and aspirin therapies    Signed:  Delon IVAR Passer, NP  [facility] Direct Care Hospitalist  Pager [phone]  09/18/2023 at 8:10 AM      Discussed with attending, Jackson Dorn FALCON., MD    MEDICINE ATTENDING ADDENDUM:    DATE OF SERVICE: 09/18/2023    I have seen and examined the patient on the date of service noted above and discussed the patient's management with the nurse practitioner. I have reviewed the NP's note, reviewed medications, reviewed all laboratory and diagnostic studies outlined above, and agree with the documented findings, impression, and plan of care, which we formulated together, along with my additions and/or clarifications.    Switch to apixaban tonight; if tolerates, will resume ASA and possible d/c home tomorrow. Advance to regular diet. Continue pantoprazole 40mg  IV BID.    52  minutes of non-overlapping time were spent, representing greater than  50% of of the total MD/NP visit time of 88 minutes, which include today's pre-visit review of the chart, obtaining appropriate history, performing an evaluation, documentation and discussion of management with details supported within the note for today's visit. The time documented was exclusive of any time spent on the separately billed procedure.     Dorn Sprung, MD  Hospitalist - Internal Medicine  Pager: [phone]

## 2023-09-18 NOTE — Consults
 IP CM ACTIVE DISCHARGE PLANNING  Department of Care Coordination      Admit Ijuz:[id number]  Anticipated Date of Discharge: 09/19/2023    Following FI:Hjmrpj, Dorn FALCON., MD      Today's short update     DCP: Home when medically stable. GIB, on hep drip, pending PT eval. Home dc with HH vs no needs. Pending plan per CM.     Disposition     Home w/Home Health  Durable Medical Equipment, Nursing  [address],  [place] [address]  Family/Support System in agreement with the current discharge plan: No, they are not in agreement (Comment on Barrier)    Freedom of Choice                                  Multidisciplinary Team Member Plan of Care   Interdisciplinary rounds were conducted with the multidisciplinary team including the clinical social worker and nurse case manager. The patient's plan of care and discharge plan were discussed and formulated based on the patient's specific needs.    Home Health Coordination Status (if applicable)     Referral under review (4/7)    Home Infusion Coordination Status (if applicable)               DME or RT Equipment Status (if applicable)     Referral sent-out to providers (via [employer]) (3/7)         Facility Transfer/Placement Status (if applicable)          Medical Transportation Arrangement Status (if applicable)                Non-medical Transportation Arrangement Status (if applicable)     Patient/family secured          New Hemodialysis Status (if applicable)          Resumption of Hemodialysis Status (if applicable)          Palliative Care Status (if applicable)            Hospice Coordination Status (if applicable)               Other Arrangements (if applicable)                         PASRR                             Dorene Lime, RN,  09/18/2023

## 2023-09-18 NOTE — Progress Notes
 Pharmaceutical Services - Heparin Consult Management Note    Objective Data   Allergies: Patient has no known allergies.  Height:   Most recent documented height   08/09/23 1.676 m (5' 6'')     Actual Weight:   Most recent documented weight   09/18/23 90 kg       Heparin drip administrations (last 48 hours)       Date/Time Action Medication Dose Rate    09/17/23 1647 New Bag/ Syringe/ Cartridge    heparin 25,000 units in 0.45% NaCl 250 mL drip RTU 710 Units/hr 7.1 mL/hr            Anticoagulant/Antiplatelet administrations (last 48 hours)       Date/Time Action Medication Dose    09/16/23 0851 Given    apixaban tab 5 mg 5 mg            Current Labs  Recent Labs     09/17/23  1559 09/17/23  2207 09/18/23  0451   APTT 29.9   < > 120.6*   INR 1.0  --   --    BILITOT  --   --  0.5   HGB  --    < > 12.0*   PLT  --    < > 107*    < > = values in this interval not displayed.     No results found for: ''HEPPLTAB''    Assessment:  Protocol: Very Low Intensity: Anti-Xa goal 0.1-0.3 or aPTT goal 46-66  Anti-Xa aPTT Hold Infusion Rate Change Check Anti-Xa/aPTT   <0.1 <46 - Increase by 2 units/kg/hr 6 hours after rate change   0.1-0.3 46-66 Continue current rate  Every 6 hours   0.31-0.4 66.1-77 - Decrease by 1 unit/kg/hr 6 hours after rate change   0.41-0.6 77.1-98 - Decrease by 2 units/kg/hr    0.61-0.8 98.1-118 Hold for 1 hour Decrease by 3 units/kg/hr    0.81-1 118.1-140 Hold for 1 hour Decrease by 4 units/kg/hr    >1 >140 Hold infusion and repeat STAT anti-Xa/aPTT in 1 hour. Notify provider to assess the patient for bleeding. Approval to resume heparin must be received from the provider prior to restarting heparin infusion based on the following. Pharmacist will document the provider?s name on the daily progress note.   If anti-Xa <= 0.7 or aPTT <= 108, restart infusion and decrease by 5 units/kg/hr. Repeat anti-Xa/aPTT 6 hours after rate change.  If anti-Xa > 0.7 or aPTT > 108, continue to hold and repeat anti-Xa/aPTT in 2 hours. Notify provider for further discussion.*    *When anti-Xa <= 0.5 or aPTT <= 87, restarting infusion at <= 50% of the previous rate may be considered.     Indication: Afib/Aflutter  Goal: aPTT 46-66 (last apix 6/16)  Dosing weight: 88 kg      68 y.o. 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 OSH admission 6/1 with irregular heart beats, transferred to [facility] for HLOC. Started on Eliquis ~08/2023 - tachy-brady syndrome, SSS, some Afib noted on OSH note.      6/10: Heparin start aPTT 66-87 for Afib  6/11: Around 2-2:30 am, patient had tachyarrhythmia, lightheadedness, consistent w/ tachy-brady syndrome. ICU monitoring was not deemed necessary given stable hemodynamics. EP planning for PPM, possibly 6/16  6/12: Upgrade to CCU d/t unstable  arrhythmia. Plan for add-on PPM   6/14: Heparin stopped at [place] - S/p PPM, okay to resume AC 48hrs after  6/16: Eliquis 5mg  BID started (1 dose given) - dc'd d/t melena   6/17: EGD - Gastric ulcer, one area with a visible vessel s/p hemostasis, likely cause of recent bleeding.      - Restarting heparin at aPTT 46-66 goal given recent GIB     Plan:  Shaquile Lutze is a 68 y.o. male who has been referred to pharmacy for therapeutic heparin management.     Last  aPTT  at 0451 was 120.6, which is supratherapeutic.  CBC stable. PLT 99 - 107   High sensitivity to heparin.   D/w RN - possible contamination.   Will send repeat aPTT now.       Pharmacy will continue to monitor the patient?s clinical progress and communicate any changes with treatment team. For questions about this patient's heparin therapy, please call [phone] [facility]) or [phone] [facility]).    Cheril Roxan Cramp, PharmD, 09/18/2023, 7:22 AM     Addendum:  Repeat  aPTT  at 0837 was 176.3, which is rising further.    Plan:   Hold x 1 hour. Repeat aPTT @ 1130     Pharmacy will continue to monitor the patient?s clinical progress and communicate any changes with treatment team. For questions about this patient's heparin therapy, please call [phone] [facility]) or [phone] [facility]).    Cheril Roxan Cramp, PharmD 09/18/2023 10:15 AM     Addendum:  Heparin held @ 1035   Repeat aPTT 73.5 @ 1159   Improving. Will resume at lowered rate.   Team planning to switch heparin to Eliquis tonight at 21:00     Plan:   Heparin rate: Initiate at 260 units/hr  Next aPTT ordered at 1900.  Switch to Eliquis at 21:00 - if aPTT is high, need to d/w team.      Pharmacy will continue to monitor the patient?s clinical progress and communicate any changes with treatment team. For questions about this patient's heparin therapy, please call [phone] [facility]) or [phone] [facility]).    Cheril Roxan Cramp, PharmD 09/18/2023 1:09 PM

## 2023-09-19 LAB — Basic Metabolic Panel
CALCIUM: 9.1 mg/dL (ref 8.6–10.4)
CREATININE: 0.83 mg/dL (ref 0.60–1.30)
POTASSIUM: 4 mmol/L (ref 3.6–5.3)
SODIUM: 142 mmol/L (ref 135–146)
SODIUM: 141 mmol/L (ref 135–146)
TOTAL CO2: 22 mmol/L (ref 20–30)

## 2023-09-19 LAB — Chloride,POC: CHLORIDE,POC: 101 mmol/L (ref 96–106)

## 2023-09-19 LAB — HS Troponin I (Single): HIGH SENSITIVITY TROPONIN I: 17 ng/L — ABNORMAL HIGH (ref <5)

## 2023-09-19 LAB — CBC
HEMATOCRIT: 38.5 % (ref 38.5–52.0)
MEAN CORPUSCULAR VOLUME: 86.6 fL (ref 79.3–98.6)
MEAN CORPUSCULAR VOLUME: 88 fL (ref 79.3–98.6)

## 2023-09-19 LAB — Differential Automated
BASOPHIL PERCENT, AUTO: 0.6 % (ref 0.20–0.80)
NEUTROPHIL PERCENT, AUTO: 61.1 % (ref 0.00–0.50)

## 2023-09-19 LAB — Hemoglobin,POC: HEMOGLOBIN,POC: 13.6 g/dL (ref 13.5–17.1)

## 2023-09-19 LAB — Magnesium
MAGNESIUM: 2.3 meq/L — ABNORMAL HIGH (ref 1.4–1.9)
MAGNESIUM: 2 meq/L — ABNORMAL HIGH (ref 1.4–1.9)

## 2023-09-19 LAB — Calcium,Ionized: IONIZED CA++,CORRECTED: 1.1 mmol/L (ref 1.09–1.29)

## 2023-09-19 LAB — Extra Gray Top

## 2023-09-19 LAB — CO2,POC: CO2,POC: 27 mmol/L (ref 20–30)

## 2023-09-19 LAB — Blood Gases, venous,POC: BICARBONATE, VENOUS,POC: 29.9 mmol/L (ref 23.0–31.0)

## 2023-09-19 LAB — Glucose,POC: GLUCOSE,POC: 90 mg/dL (ref 65–99)

## 2023-09-19 LAB — Potassium,POC: POTASSIUM,POC: 4 mmol/L (ref 3.6–5.3)

## 2023-09-19 LAB — APTT: APTT: 46.1 s — ABNORMAL HIGH (ref 24.4–36.2)

## 2023-09-19 LAB — HS Troponin I + Reflex If >=  5 ng/L: HIGH SENSITIVITY TROPONIN I: 21 ng/L — ABNORMAL HIGH (ref <5)

## 2023-09-19 LAB — LACTATE, POC: LACTATE, POCT: 14 mg/dL (ref 5–18)

## 2023-09-19 LAB — Prothrombin Time Panel: PROTHROMBIN TIME: 12.9 s (ref 11.5–14.4)

## 2023-09-19 LAB — Ionized Calcium,POC: IONIZED CA,UNCORR,POC: 1.19 mmol/L (ref 1.09–1.29)

## 2023-09-19 LAB — Sodium,POC: SODIUM,POC: 143 mmol/L (ref 135–146)

## 2023-09-19 MED ADMIN — OYSTER CALCIUM 1250 (500 CA) MG PO TABS: 1250 mg | ORAL | @ 15:00:00 | Stop: 2023-09-22 | NDC 00904188361

## 2023-09-19 MED ADMIN — AMIODARONE HCL 150 MG/3ML IV SOLN: @ 08:00:00 | Stop: 2023-09-19 | NDC 00143987525

## 2023-09-19 MED ADMIN — AMIODARONE HCL 150 MG/100 ML DRIP RTU: 150 mg | INTRAVENOUS | @ 04:00:00 | Stop: 2023-09-19 | NDC 43066015010

## 2023-09-19 MED ADMIN — AMIODARONE HCL 360 MG/200 ML DRIP RTU: 1 mg/min | INTRAVENOUS | @ 10:00:00 | Stop: 2023-09-19

## 2023-09-19 MED ADMIN — MAGNESIUM SULFATE 2 GM/50ML IV SOLN: 2 g | INTRAVENOUS | @ 02:00:00 | Stop: 2023-09-19 | NDC 70121171901

## 2023-09-19 MED ADMIN — METOPROLOL TARTRATE 25 MG PO TABS: 25 mg | ORAL | @ 18:00:00 | Stop: 2023-09-21 | NDC 62584026511

## 2023-09-19 MED ADMIN — AMIODARONE HCL 200 MG PO TABS: 200 mg | ORAL | @ 04:00:00 | Stop: 2023-09-19 | NDC 60687043711

## 2023-09-19 MED ADMIN — ASPIRIN 81 MG PO CHEW: 81 mg | ORAL | @ 15:00:00 | Stop: 2023-09-20 | NDC 66553000201

## 2023-09-19 MED ADMIN — METOPROLOL TARTRATE 25 MG PO TABS: 25 mg | ORAL | @ 15:00:00 | Stop: 2023-09-21 | NDC 62584026511

## 2023-09-19 MED ADMIN — ATORVASTATIN CALCIUM 20 MG PO TABS: 20 mg | ORAL | @ 05:00:00 | Stop: 2023-09-22 | NDC 68084009811

## 2023-09-19 MED ADMIN — PANTOPRAZOLE SODIUM 40 MG IV SOLR: 40 mg | INTRAVENOUS | @ 15:00:00 | Stop: 2023-09-22 | NDC 71288060010

## 2023-09-19 MED ADMIN — AMIODARONE HCL 360 MG/200 ML DRIP RTU: 1 mg/min | INTRAVENOUS | @ 04:00:00 | Stop: 2023-09-19 | NDC 43066036020

## 2023-09-19 MED ADMIN — ASCORBIC ACID 500 MG PO TABS: 500 mg | ORAL | @ 15:00:00 | Stop: 2023-09-22 | NDC 00904052361

## 2023-09-19 MED ADMIN — HEPARIN (PORCINE) IN NACL 25000-0.45 UT/250ML-% IV SOLN: 260 [IU]/h | INTRAVENOUS | @ 04:00:00 | Stop: 2023-09-19

## 2023-09-19 MED ADMIN — APIXABAN 5 MG PO TABS: 5 mg | ORAL | @ 05:00:00 | Stop: 2023-09-22 | NDC 00003089431

## 2023-09-19 MED ADMIN — ALLOPURINOL 100 MG PO TABS: 200 mg | ORAL | @ 15:00:00 | Stop: 2023-09-22 | NDC 60687067711

## 2023-09-19 MED ADMIN — AMIODARONE HCL 360 MG/200 ML DRIP RTU: .5 mg/min | INTRAVENOUS | @ 10:00:00 | Stop: 2023-09-20

## 2023-09-19 MED ADMIN — AMIODARONE HCL 360 MG/200 ML DRIP RTU: .5 mg/min | INTRAVENOUS | @ 22:00:00 | Stop: 2023-09-20 | NDC 43066036020

## 2023-09-19 MED ADMIN — AMIODARONE HCL 360 MG/200 ML DRIP RTU: 1 mg/min | INTRAVENOUS | @ 10:00:00 | Stop: 2023-09-19 | NDC 43066036020

## 2023-09-19 MED ADMIN — CHOLECALCIFEROL 25 MCG (1000 UT) PO TABS: 25 ug | ORAL | @ 15:00:00 | Stop: 2023-09-22

## 2023-09-19 MED ADMIN — PANTOPRAZOLE SODIUM 40 MG IV SOLR: 40 mg | INTRAVENOUS | @ 05:00:00 | Stop: 2023-09-22 | NDC 71288060010

## 2023-09-19 MED ADMIN — APIXABAN 5 MG PO TABS: 5 mg | ORAL | @ 15:00:00 | Stop: 2023-09-22 | NDC 00003089431

## 2023-09-19 NOTE — Other
 [name] Clinical Goal:   Clinical Goal(s) for the Shift: monitor cardiac rhythm, replete lytes prn, maintain safety and comfort  Identify possible barriers to advancing the care plan: none  Stability of the patient: Moderately Unstable - medium risk of patient condition declining or worsening    Progression of [name] Clinical Goal: AOx4, afebrile, SBP 90s-130s , SpO2 >95% on 1L NC, NSR w/ BBB on monitor. Pt's HR went up to 170s, converts in and out of SVT, paged MD Sonda twice @ [phone] and [phone]. MD came to bedside and assessed pt. Pt was A/Ox4, alert and cooperative, pt reported dizziness and weakness. HR still on the 170s. MD Sonda informed RN to call code blue. CCU Team came to bedside and assessed pt. Labs were drawn, Amiodarone bolus was ordered and given (see MAR). Pt now on Amiodarone gtt. Pt's HR is better controlled in the 60s. Pending AM labs. I/O net -198 . Heparin gtt stopped @ 2033 as ordered. 12-lead EKG done @ 0420. Adequate rest provided. Call light placed within reach. Maintained safety and comfort.    Plan:  Continue Amio gtt  DC plan w/ home health

## 2023-09-19 NOTE — Significant Event
 Brief ICU Triage Documentation  {MICU/CCU:33009} was called by *** at 7:20 AM 09/19/2023 to evaluate Andrew [name]    Subjective / Reason for ICU evaluation:   ***     Objective: BP 132/77  ~ Pulse 66  ~ Temp 36.8 ?C (98.2 ?F) (Temporal)  ~ Resp 18  ~ Wt 89 kg (196 lb 3.4 oz)  ~ SpO2 95%  ~ BMI 31.67 kg/m?   Focused exam:     Assessment: ***    Management recs:   Work-up recommended: ***  Treatment recommended: ***    Disposition: Recommend {floor/ICU:33011}    Based on the recommended disposition, I discussed this case with Dr. PIERRETTE on *** team who has agreed to {tag-you're-it:33012}    Case will be discussed with attending ***     Signed, Andrew HERO. Harlean Regula, MD  09/19/2023

## 2023-09-19 NOTE — Consults
 Inpatient Cardiac Electrophysiology Consultation    DATE OF SERVICE: 09/19/2023  PATIENT: Andrew [name]   MRN: [id number]  DOB: 01-09-1956    REASON FOR CONSULTATION: Tachy-brady syndrome    71M admitted for further management of tachy brady syndrome    MEDICAL PROBLEMS  Tachy-brady syndrome  Status post implantation of a dual chamber pacemaker (Medtronic Azure) September 14, 2023  Supraventricular tachycardia  Paroxysmal atrial fibrillation (CHADSVASc score = 4 due to age, stroke, and hypertension)  Anticoagulation: apixaban 5mg  twice daily  Rate control: diltiazem and previously metoprolol  History of bicuspid aortic valve stenosis  Status post surgical, bioprosthetic aortic valve replacement January 2013 [facility] Ease #25; Dr. Talitha; [facility])  Postoperative stroke, with residual left-sided and right lower extremity weakness    HISTORY OF PRESENT ILLNESS:    Index diagnosis of arrhythmia was that of paroxysmal atrial fibrillation following elective surgical bioprosthetic aortic valve replacement at [facility] in 2013; he did not have recurrence following this isolated episode and thus was not managed with antiarrhythmic nor anticoagulation regimen.      He presented to the hospital twice more in May 2025, once for treatment of dyspnea and suspected heart failure, and again for palpitation, where he was diagnosed with recurrence of atrial fibrillation and rapid ventricular rates; this was managed with diltiazem and apixaban. As he re-presented for palpitation and dizziness on Aug 31, 2023 to [facility]) he was treated with IV diltiazem and found to have subsequent pauses up to 5 seconds' duration. He does not recall symptoms, but rather than he was given a diagnosis of tachy-brady syndrome and recommended to undergo pacemaker placement.      Initiated on amiodarone 09/10/2023 for treatment of atrial arrhythmia; subsequently had recurrence of SVT (likely AT) with symptomatic conversion pause.     Underwent pacemaker implantation 09/14/2023 without complication. Thereafter had recurrence of atrial arrhythmia.       PAST MEDICAL HISTORY:  Past Medical History:   Diagnosis Date    CVA (cerebral vascular accident) (HCC/RAF)     H/O aortic valve replacement     Kidney stone     Urinary frequency        PAST SURGICAL HISTORY:  Past Surgical History:   Procedure Laterality Date    AORTIC VALVE REPLACEMENT         FAMILY HISTORY:  Family History   Problem Relation Age of Onset    No Known Problems Mother     Other (gastric ulcer) Father     Uterine cancer Sister     Stroke Brother     Kidney failure Brother     Colon cancer Neg Hx     Lung cancer Neg Hx     Prostate cancer Neg Hx        SOCIAL HISTORY:  Social History     Socioeconomic History    Marital status: Married   Tobacco Use    Smoking status: Former     Current packs/day: 0.00     Average packs/day: 1 pack/day for 15.0 years (15.0 ttl pk-yrs)     Types: Cigarettes     Start date: 05/03/1975     Quit date: 05/02/1990     Years since quitting: 33.4    Smokeless tobacco: Never   Substance and Sexual Activity    Alcohol use: Not Currently     Comment: occasionally has red wine    Drug use: Not Currently     Social Drivers of  Health     Financial Resource Strain: Low Risk  (09/10/2023)    Financial Resource Strain     Difficulty of Paying Living Expenses: Not very hard       OUTPATIENT MEDICATIONS:  Current Facility-Administered Medications   Medication Dose Route Frequency    allopurinol tab 200 mg  200 mg Oral Daily    [COMPLETED] amiodarone 150 mg in dextrose 100 mL IVPB RTU 150 mg  150 mg Intravenous Once    [COMPLETED] amiodarone 150 mg in dextrose 100 mL IVPB RTU 150 mg  150 mg Intravenous Once    [COMPLETED] amiodarone 150 mg in dextrose 100 mL IVPB RTU 150 mg  150 mg Intravenous Once    Followed by    [EXPIRED] amiodarone 360 mg in dextrose 200 mL drip RTU (peripheral)  1 mg/min Intravenous Continuous    Followed by    amiodarone 360 mg in dextrose 200 mL drip RTU (peripheral)  0.5 mg/min Intravenous Continuous    [COMPLETED] amiodarone 150 mg/3 mL inj    PRN    apixaban tab 5 mg  5 mg Oral BID    ascorbic acid tab 500 mg  500 mg Oral Daily with breakfast    aspirin chew tab 81 mg  81 mg Oral Daily    atorvastatin tab 20 mg  20 mg Oral Nightly    calcium carbonate 1250 mg (500 mg elemental) tab 1,250 mg  1,250 mg Oral Daily with breakfast    [EXPIRED] heparin 25,000 units in 0.45% NaCl 250 mL drip RTU  260 Units/hr Intravenous Continuous    [EXPIRED] heparin per pharmacy   Does not apply Continuous    [COMPLETED] magnesium sulfate 2 g in water for injection 50 mL RTU  2 g Intravenous Once    [COMPLETED] magnesium sulfate 2 g in water for injection 50 mL RTU  2 g Intravenous Once    [COMPLETED] magnesium sulfate 2 g in water for injection 50 mL RTU  2 g Intravenous Once    [COMPLETED] magnesium sulfate 2 g in water for injection 50 mL RTU  2 g Intravenous Once    [COMPLETED] magnesium sulfate 4 g in water for injection 100 mL RTU  4 g Intravenous Once    [COMPLETED] magnesium sulfate 4 g in water for injection 100 mL RTU  4 g Intravenous Once    metoprolol tartrate tab 25 mg  25 mg Oral Q6H    pantoprazole inj 40 mg  40 mg IV Push Q12H    [COMPLETED] perflutren lipid microspheres (Definity) 6.52 mg/mL inj 1.5 mL  1.5 mL Intravenous Once    [COMPLETED] potassium chloride CR tab 40 mEq  40 mEq Oral Once    [COMPLETED] sodium chloride 0.9% IV soln bolus 500 mL  500 mL Intravenous Once    [COMPLETED] sodium chloride 0.9% IV soln bolus 500 mL  500 mL Intravenous Once    [COMPLETED] sodium chloride 0.9% IV soln        [COMPLETED] vancomycin (Vancocin) 500 mg in sodium chloride 0.9% 500 mL 1 mg/mL irrigation soln    Continuous PRN    [COMPLETED] vancomycin 1 g in dextrose 200 mL IVPB RTU  1 g Intravenous Once    vitamin D (cholecalciferol) tab 25 mcg  25 mcg Oral Daily    [DISCONTINUED] acetaminophen IV inj 1,000 mg  1,000 mg Intravenous Once PRN [DISCONTINUED] amiodarone tab 200 mg  200 mg Oral BID    [DISCONTINUED] amLODIPine tab 5 mg  5  mg Oral Daily    [DISCONTINUED] apixaban tab 5 mg  5 mg Oral BID    [DISCONTINUED] aspirin chew tab 81 mg  81 mg Oral Daily    [DISCONTINUED] dermabond mini skin adhesive    PRN    [DISCONTINUED] heparin 25,000 units in 0.45% NaCl 250 mL drip RTU  910 Units/hr Intravenous Continuous    [DISCONTINUED] heparin 25,000 units in 0.45% NaCl 250 mL drip RTU  910 Units/hr Intravenous Continuous    [DISCONTINUED] heparin 25,000 units in 0.45% NaCl 250 mL drip RTU  710 Units/hr Intravenous Continuous    [DISCONTINUED] heparin per pharmacy   Does not apply Continuous    [DISCONTINUED] lidocaine PF 1% inj    PRN    [DISCONTINUED] losartan tab 12.5 mg  12.5 mg Oral Daily    [DISCONTINUED] losartan tab 25 mg  25 mg Oral BID    [DISCONTINUED] losartan tab 25 mg  25 mg Oral Daily    [DISCONTINUED] metoprolol tartrate tab 12.5 mg  12.5 mg Oral QID    [DISCONTINUED] pantoprazole inj 40 mg  40 mg IV Push Q24H    [DISCONTINUED] pantoprazole inj 40 mg  40 mg IV Push Q12H    [DISCONTINUED] Patient Transfer (Notification to Pharmacy)-REQUIRED   Does not apply Pharmacy Communication    [DISCONTINUED] polyethylene glycol pwd pkt 17 g  17 g Oral Daily PRN    [DISCONTINUED] prochlorperazine 10 mg/2 mL inj 10 mg  10 mg Intravenous Once PRN    [DISCONTINUED] senna tab 1 tablet  1 tablet Oral QHS PRN    [DISCONTINUED] sodium chloride 0.9% IV soln bolus 250 mL  250 mL Intravenous Once    [DISCONTINUED] sodium chloride 0.9% IV soln bolus 500 mL  500 mL Intravenous Once    [DISCONTINUED] sodium chloride 0.9% IV soln   Intravenous PRN     Facility-Administered Medications Ordered in Other Encounters   Medication Dose Route Frequency    [DISCONTINUED] acetaminophen IV inj   Intravenous PRN    [DISCONTINUED] ceFAZolin inj   Intravenous PRN    [DISCONTINUED] fentaNYL (PF) 50 mcg/mL inj   Intravenous PRN    [DISCONTINUED] lidocaine (Cardiac) 100 mg/5 mL inj Intravenous PRN    [DISCONTINUED] lidocaine PF 1% inj   Intravenous PRN    [DISCONTINUED] ondansetron 4 mg/2 mL inj   Intravenous PRN    [DISCONTINUED] ondansetron 4 mg/2 mL inj   Intravenous PRN    [DISCONTINUED] phenylephrine 10 mg/mL inj   Intravenous PRN    [DISCONTINUED] phenylephrine 10 mg/mL inj   Intravenous PRN    [DISCONTINUED] Plasma-Lyte-A pH 7.4 IV soln   Intravenous Continuous PRN    [DISCONTINUED] Plasma-Lyte-A pH 7.4 IV soln   Intravenous Continuous PRN    [DISCONTINUED] propofol 200 mg/20 mL inj   Intravenous Continuous PRN    [DISCONTINUED] propofol 200 mg/20 mL inj   Intravenous Continuous PRN    [DISCONTINUED] propofol 200 mg/20 mL inj   Intravenous PRN        ALLERGIES:  No Known Allergies    INPATIENT SCHEDULED MEDICATIONS:   allopurinol  200 mg Oral Daily    apixaban  5 mg Oral BID    ascorbic acid  500 mg Oral Daily with breakfast    aspirin  81 mg Oral Daily    atorvastatin  20 mg Oral Nightly    calcium carbonate 1250 mg (500 mg elemental)  1,250 mg Oral Daily with breakfast    metoprolol tartrate  25 mg Oral Q6H  pantoprazole  40 mg IV Push Q12H    cholecalciferol  25 mcg Oral Daily       PHYSICAL EXAMINATION:  VITALS: BP 103/75  ~ Pulse 61  ~ Temp 36.5 ?C (97.7 ?F) (Oral)  ~ Resp 18  ~ Wt 196 lb 3.4 oz (89 kg)  ~ SpO2 96%  ~ BMI 31.67 kg/m?    Temp:  [36.2 ?C (97.2 ?F)-36.8 ?C (98.2 ?F)] 36.5 ?C (97.7 ?F)  Heart Rate:  [60-178] 61  Resp:  [17-19] 18  BP: (59-135)/(25-111) 103/75  NBP Mean:  [39-119] 85  SpO2:  [93 %-100 %] 96 %        Intake/Output Summary (Last 24 hours) at 09/19/2023 1205  Last data filed at 09/19/2023 1122  Gross per 24 hour   Intake 1113.99 ml   Output 2190 ml   Net -1076.01 ml       Exam:   General: No acute distress  HEENT: no overt masses nor deviation. JVP not assessed.   CV: Regular rate by peripheral exam.   Resp: No frank wheezes nor crackles.  GI: nondistended    MSK: No significant edema  Neuro: grossly non focal      LABORATORY:  Lab Results   Component Value Date    WBC 6.08 09/19/2023    HGB 11.4 (L) 09/19/2023    HCT 34.4 (L) 09/19/2023    MCV 86.6 09/19/2023    PLT 110 (L) 09/19/2023    Lab Results   Component Value Date    K 4.2 09/18/2023    MG 2.0 (H) 09/19/2023    CREAT 0.77 09/18/2023    Lab Results   Component Value Date    ALT 38 09/18/2023    AST 22 09/18/2023    ALKPHOS 66 09/18/2023    BILITOT 0.5 09/18/2023      Lab Results   Component Value Date    INR 1.0 09/18/2023    APTT 46.1 (H) 09/18/2023    Lab Results   Component Value Date    TSH 4.1 09/10/2023    Lab Results   Component Value Date    HGBA1C 6.4 (H) 05/25/2023     Lab Results   Component Value Date    TROPONIN 21 (H) 09/18/2023         DIAGNOSTIC STUDIES:    Telemetry review 09/18/2023 at 18:36: initiation of SVT with PAC:      Termination of same episode after approximately 20 seconds' duration (ends with a ventricular complex):       12-lead ECG 09/19/2023 at 04:45: sinus rhythm at 68 bpm, PR interval , and QRS duration :       TTE 08/08/2023: LV EF 60-65% with normal LV size and moderate concentric LVH.        ASSESSMENT:    58M admitted for further management of tachy brady syndrome    MEDICAL PROBLEMS  Tachy-brady syndrome  Status post implantation of a dual chamber pacemaker (Medtronic Azure) September 14, 2023  Supraventricular tachycardia  Paroxysmal atrial fibrillation (CHADSVASc score = 4 due to age, stroke, and hypertension)  Anticoagulation: apixaban 5mg  twice daily  Rate control: diltiazem and previously metoprolol  History of bicuspid aortic valve stenosis  Status post surgical, bioprosthetic aortic valve replacement January 2013 [facility] Ease #25; Dr. Talitha; [facility])  Postoperative stroke, with residual left-sided and right lower extremity weakness    DISCUSSION:    Cardiac Electrophysiology was consulted for continued management of symptomatic supraventricular tachycardia.  Following uncomplicated pacemaker implantation for tachy-brady syndrome, favor use of antiarrhythmic and rate control medication as tolerated.     As outpatient, EP study and possible ablation can be considered if SVT recurs despite use of medication.     RECOMMENDATIONS:    Continue amiodarone infusion, and as early as 09/20/2023, transition to oral. Recommend 400mg  twice daily ending 09/26/2023, followed by 200mg  daily thereafter.   Continue metoprolol and up-titrate as tolerated; recommend consolidation into twice-daily dosing prior to discharge.   Will follow up in Cardiac Arrhythmia clinic for routine device interrogation and for SVT management.     Bebe Farr, MD  Clinical Cardiac Electrophysiology Fellow    ATTENDING ADDENDUM:  I have seen and examined the patient as well as reviewed the relevant information in formulation of the assessment and plan. I am in agreement with the findings, examination, assessment, and plan as documented by the fellow physician, and verified the documentation above. I have discussed the plan of care with the patient.    Jenetta Lease, MD  [facility]

## 2023-09-19 NOTE — Progress Notes
 [facility] OF [facility]IC[facility]   INPATIENT NOTE TEMPLATE   DIVISION OF CARDIOLOGY  RESIDENT PROGRESS NOTE      MEDICINE TO CCU/COU TRANSFER NOTE     PATIENT: Andrew [name]               MRN: [id number]   PCP: Jeana Francisco, MD  ADMISSION DATE: 09/09/2023  HOSPITAL DAY: 10  DATE OF SERVICE: 09/19/2023   CC: No chief complaint on file.     Brief hospital course   Andrew [name] is a 68 y.o. male  w/ h/o bicuspid AV sp TAVR 2013 (bioprosthetic) cb CVA w residual LLE weakess, presented to OSH w pre-syncope transferred for reported tachy-brady s/p PPM and now admitted for melena s/p EGD with gastric ulcer. Was re-upgraded to COU service ON 6/19 for unstable SVT.    CCU course:  Patient initially presented to OSH on 6/1 for sycope and found to have a tachy-brady arrhythmia and was transferred to [facility] for pacemaker assessment. During hospital course, patient found to have SVT with sinus bradycardia. Started on amiodarone gtt and heparin gtt to control symptoms. PPM was placed on 6/14 without complications and no ACC for 48 hours. When restarted on apixaban patient was noted to have one episode of melena and then went into afib with RVR. Afib resolved with fluids and transfusion, but HgB noted to not have increased appropriately with fluids. GI consulted and plan to scope for GIB.     Medicine course:  He was scoped by GI and was found to have bleeding gastric ulcer that was treated, recommend repeat EGD in few months, as was unable to take biopsies. Overnight 6/19, code blue was called for regular wide complex tachycardia, patient was alert and oriented and pulses were in-tact throughout episodes. He frequently would convert in and out of an aberrant SVT with MAPs <60s during these episodes. They lasted for no longer than 1 minute and self terminated on its own. Decided to re-bolus his amiodarone and re-upgrade to COU for closer monitoring.    Medications   Scheduled:  allopurinol, 200 mg, Oral, Daily  amiodarone, 200 mg, Oral, BID  apixaban, 5 mg, Oral, BID  ascorbic acid, 500 mg, Oral, Daily with breakfast  atorvastatin, 20 mg, Oral, Nightly  calcium carbonate 1250 mg (500 mg elemental), 1,250 mg, Oral, Daily with breakfast  pantoprazole, 40 mg, IV Push, Q12H  cholecalciferol, 25 mcg, Oral, Daily    Infusions:   amiodraone 1.8 mg/mL drip (ADULT) 0.5 mg/min (09/19/23 0246)       PRN:     Physical Exam     Vital Signs:   Temp:  [36.3 ?C (97.4 ?F)-37 ?C (98.6 ?F)] 36.3 ?C (97.4 ?F)  Heart Rate:  [60-178] 60  Resp:  [17-19] 19  BP: (59-142)/(25-111) 127/84  NBP Mean:  [39-119] 97  SpO2:  [93 %-100 %] 99 %       I&O's:   I/O last 2 completed shifts:  In: 1141.8 [P.O.:960; I.V.:131.8; IV Piggyback:50]  Out: 3195 [Urine:3195] Oxygen Support:  Oxygen Therapy  SpO2: 99 %  O2 Device: Nasal cannula  Flow Rate (L/min): 1 L/min           Weight for the past 720 hrs (Last 4 readings):   Weight   09/18/23 0415 90 kg (198 lb 6.6 oz)   09/17/23 1257 88.8 kg (195 lb 12.3 oz)   09/17/23 0500 88.8 kg (195 lb 12.3 oz)   09/15/23 0530 88.1 kg (194 lb 3.6 oz)  System Normal Findings Abnormal Findings   General []  Normal general appearance   [x]  In acute distress  []  Chronically ill appearing  []  Sedated           []  Non Verbal  []  Other:   HENMT/Neck []  Oropharynx clear and moist  []  Normal JVP []  Dry mucous membranes  []  JVP ( cm)  []  Other:   Resp [x]  CTA B/L []  Intubated                 []  On Supp O2 ()  []  Wheezing ()  []  Rales ()   []  Crackles ()  []  Other:   CV [x]  Normal rhythm/rate  [x]  No murmurs  [x]  No lower extremity edema  []  Normal PMI   Normal pulses:   []  Radial []  Femoral  []  Pedal []   Abnormal rhythm ()  []   Murmur ()  []  S3    []  S4  Abnormal Pulses:  []  Radial ()  []  Femoral ()   []  DP ()  []  PT ()  []  Lower extremity edema       []  R ()   []  L ()  []  Bilateral ()  []  Other:   GI [x]  Soft  [x]  No tenderness to palpation  []  No rebound/guarding []  Tenderness ()  []  Fluid Wave  []  Hepatomegaly  Other:   MSK []  Normal strength/tone []  Other:    Skin []  No rash []  Other:   Neuro [x]  AO x 3  []  Normal strength  []  Normal sensation  []  Normal reflexes  []  Normal cerebellar exam []  AO x 0       []  AO x 1     []  AO x 2  Abnormal neuro exam:  []  Abnormal strength  []  Abnormal sensation  []  Abnormal reflexes  []  Abnormal cerebellar exam       Labs/Studies     Labs  (click to expand/collapse)    CBC  Recent Labs     09/18/23  1957 09/18/23  1159 09/18/23  0451   WBC 6.77 5.37 6.09   HGB 12.7* 11.9* 12.0*   HCT 38.5 37.0* 35.8*   MCV 87.5 88.9 86.9   PLT 118* 110* 107*     BMP  Recent Labs     09/18/23  2011 09/18/23  1957 09/18/23  0451 09/17/23  0434   NA 142 141 141 139   K 4.2 4.3 4.0 4.4   CL 103 102 105 105   CO2 26 22 25 24    BUN 9 9 11 20    CREAT 0.77 0.76 0.72 0.72   CALCIUM 9.1 9.1 8.5* 8.3*   MG 2.3*  --  1.7 2.1*     LFT  Recent Labs     09/18/23  0451 09/17/23  0434 09/16/23  0826   TOTPRO 6.5 6.2 7.1   ALBUMIN 3.5* 3.4* 3.9   BILITOT 0.5 0.4 0.4   ALT 38 44 71*   AST 22 23 34   ALKPHOS 66 63 79       Coags  Recent Labs     09/18/23  1957 09/18/23  1935 09/18/23  1159 09/18/23  0837 09/17/23  2207 09/17/23  1559 09/16/23  1918   INR 1.0  --   --   --   --  1.0 1.1   PT 12.9  --   --   --   --  13.5 13.8   APTT  --  46.1* 73.5* 176.3*   < > 29.9  --     < > = values in this interval not displayed.       Glucose  Recent Labs     09/18/23  2011   GLUCOSE 88         A&P   Andrew [name] is a 68 y.o. male  w/ h/o bicuspid AV sp TAVR 2013 (bioprosthetic) cb CVA w residual LLE weakess, presented to OSH w pre-syncope transferred for reported tachy-brady s/p PPM and now admitted for melena now s/p EGD showing gastric ulcers. Was re-upgraded to COU service ON 6/19 for unstable SVT.    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 Then no recurrence and so was not on AC. Two admissions 08/2023 second of which had recurrence of fib and managed with dilt and apix. S/P PPM TTE showed no pericardial effusion. He was re-upgraded to COU 6/18 for ongoing unstable SVT with aberrancy in which episodes would self terminate. Episodes largely resolved after rebolusing amiodarone  - Re bolus IV amiodarone 150 mg, then 1 mg/min, now 0.5 mg/min for 1 day  - Start metop tartrate 25 mg q6h  - Consider re-engaging EP in AM  - Amio 200 bid x 2 weeks (6/11-6/24) and then 200 every day after per EP  - Resume apix 5mg  BID tonight  - s/p PPM placement on 6/14     #Melena s/p EGD showing gastric ulcers s/p treatment  Picture in chart and HgB drop from 15->12. GI performed EGD showing gastric ulcers s/p hemoclips x2.  -GI c/s  -Pantoprazole 40 mg IV BID (on d/c transition to PO)  -Avoid NSAIDS  -Will need outpatient surveillance EGD in 2 months w/ GI    #Hx bicupsid AV sp surgical bioprosthetic AV (2013) :   - ASA   - will need one time eval to rule out coarct, can be done outpt  - family screening      #Hx periop CVA from AVR in 2013   Noted to have recovered function significant amount of function but continues to feel off balance and only 4/5 strength in LLE   - Consider resuming ASA 81 if stable from GI bleed perspective  - Atorvastatin  - PT/OT in AM     #discharge planning  - PT/OT to assess home mobility needs     #HTN:  - Holding home Amlodipine 5mg    - Holding home losartan      #HLD:   - Atorvastatin     #Gout:   - Allopurinol      #Osteoporosis   #Vit D- Deficiency   #Hx of medial malleolus fracture  - calcium carbonate 1250 mg QAM  - vitamin-D 25 mcg daily  - CAM boot as tolerated       I have seen and examined the patient and agree with the RD assessment and plan. See RD note from this hospital admission for additional details.             # IP Checklist  - Diet: Diet 2 gram sodium  - VTE PPX: On therapeutic anticoagulation  - LDA:   Peripheral IV 22 G Anterior;Right;Medial Forearm (9)  Peripheral IV 22 G Anterior;Distal;Right Arm (8)  Peripheral IV 22 G Anterior;Left;Proximal Forearm (5)    Advance Care Planning:   Full Code,  Primary Emergency Contact: Nett Lanny Ip, Home Phone: [phone], Mobile Phone:  [phone]    Disposition:  Expected post-hospitalization disposition will be to Home.    Discussed with attending Kenn Zeke POUR.*,    Author:    Yvonna EMERSON Ferretti  09/19/2023 4:05 AM     Post-Rounds Addendum:   Hannah Crill is a 68 y.o. male  w/ h/o bicuspid AV sp TAVR 2013 (bioprosthetic) cb CVA w residual LLE weakess, presented to OSH w pre-syncope transferred for reported tachy-brady s/p PPM and now admitted for melena now s/p EGD showing gastric ulcers. Was re-upgraded to COU service ON 6/19 for unstable SVT, now improved after amiodarone  bolus and stable to transfer back to direct care hospitalist service.     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 Then no recurrence and so was not on AC. Two admissions 08/2023 second of which had recurrence of fib and managed with dilt and apix. S/P PPM TTE showed no pericardial effusion. He was re-upgraded to COU 6/18 for ongoing unstable SVT with aberrancy in which episodes would self terminate. Episodes largely resolved after rebolusing amiodarone .   - Re bolus IV amiodarone  150 mg, then 1 mg/min, now 0.5 mg/min for 1 day  - after completing 24h of gtt, restart amiodarone  200mg  PO BID for 4 days to complete 10g load, then after give 200mg  PO daily   - Start metop tartrate 25 mg q6h  - EP consulted - continue medical management. Can consider EP study and possible ablation outpatient if SVT recurs   - apix 5mg  BID   - s/p PPM placement on 6/14     #Melena s/p EGD showing gastric ulcers s/p treatment  Picture in chart and HgB drop from 15->12. GI performed EGD showing gastric ulcers s/p hemoclips x2.  -GI c/s  -Pantoprazole  40 mg IV BID (on d/c transition to PO)  -Avoid NSAIDS  -Will need outpatient surveillance EGD in 2 months w/ GI    #Hx bicupsid AV sp surgical bioprosthetic AV (2013) :   - ASA   - will need one time eval to rule out coarct, can be done outpt  - family screening      #Hx periop CVA from AVR in 2013   Noted to have recovered function significant amount of function but continues to feel off balance and only 4/5 strength in LLE   - Consider resuming ASA 81 if stable from GI bleed perspective  - Atorvastatin   - PT/OT in AM     #discharge planning  - PT/OT to assess home mobility needs     #HTN:  - Holding home Amlodipine  5mg    - Holding home losartan       #HLD:   - Atorvastatin      #Gout:   - Allopurinol       #Osteoporosis   #Vit D- Deficiency   #Hx of medial malleolus fracture  - calcium  carbonate 1250 mg QAM  - vitamin-D 25 mcg daily  - CAM boot as tolerated    Discussed with attending, Dr. Kenn.    Rosaline Blower, MD 09/19/2023 3:05 PM  PGY-1, [facility] & Pediatric[facility]    I have interviewed and examined the patient with the resident/fellow physician on the date of service. All labs, studies, and medications were independently reviewed by me. I have reviewed the note above and agree with the history, physical, assessment and plan which we formulated together. The patient was informed of the above plan, demonstrated understanding  and was agreeable to the plan as written above.    MDM: High   Number/Complexity of Problem(s):   High: Patient with 1 or more chronic illnesses with severe exacerbation, progression, or side effects of treatment: Rapid atrial arrhythmia     Amount and/or Complexity of Data Reviewed/Analyzed   [x]  Reviewed/Ordered 3 or more unique labs/tests (Cat 1)   []  Reviewed external notes from unique source.   []  Spoke with independent historian.    []  Independently interpreted studies performed by another physician/health care professional (Cat 2)   [x]  Discussed management/test interpretation with external health care professional (Cat 3) - EP     Risk of Complications/Morbidity/Mortality of Patient Management   High risk of complication and/or morbidity associated with patient management, due to drug therapy requiring intensive monitoring for toxicity: amiodarone.    Zeke LOIS Kearns, MD  General Cardiology  Advanced Heart Failure and Transplant Cardiology   [facility]

## 2023-09-19 NOTE — Progress Notes
 Hospitalist Progress Note    Patient Name Andrew [name]   Patient MRN [id number]   Patient DOB Oct 20, 1955   Patient PCP Jeana Francisco, MD   Provider Harlene MICAEL Killian, MD   Admission Date 09/09/2023     Chief Complaint  No chief complaint on file.    Interval Events / Subjective  - Overnight, patient developed a regular wide complex tachycardia, likely SVT, with associated hypotension. Episode lasted for about 1 minute and self-terminated. Patient was upgraded to the CCU and re-bolused with amiodarone. He was observed overnight and this morning without reoccurrence. He will complete his IV amiodarone infusion tonight and transition to oral in the AM.      Review of Systems  A 14 review of systems was completed. Additional symptoms were otherwise negative and/or non-contributory, except as listed above.    Medications  Scheduled:  allopurinol, 200 mg, Oral, Daily  apixaban, 5 mg, Oral, BID  ascorbic acid, 500 mg, Oral, Daily with breakfast  aspirin, 81 mg, Oral, Daily  atorvastatin, 20 mg, Oral, Nightly  calcium carbonate 1250 mg (500 mg elemental), 1,250 mg, Oral, Daily with breakfast  metoprolol tartrate, 25 mg, Oral, Q6H  pantoprazole, 40 mg, IV Push, Q12H  cholecalciferol, 25 mcg, Oral, Daily  Continuous:   amiodraone 1.8 mg/mL drip (ADULT) 0.5 mg/min (09/19/23 0246)     PRN:  [COMPLETED] Transfer Patient **AND** Patient Transfer (Notification to Pharmacy)-REQUIRED  Physical Examination  Vital Signs: Temp:  [36.2 ?C (97.2 ?F)-36.8 ?C (98.2 ?F)] 36.5 ?C (97.7 ?F)  Heart Rate:  [60-178] 61  Resp:  [17-19] 18  BP: (59-135)/(25-111) 103/75  NBP Mean:  [39-119] 85  SpO2:  [94 %-100 %] 96 %      Intake/Output Summary (Last 24 hours) at 09/19/2023 1502  Last data filed at 09/19/2023 1408  Gross per 24 hour   Intake 1460.42 ml   Output 1560 ml   Net -99.58 ml     Weight: 89 kg (196 lb 3.4 oz)      Respiratory: Oxygen Therapy  SpO2: 96 %  O2 Device: None (Room air)  Flow Rate (L/min): 1 L/min      General: in no acute distress, following commands  Respiratory: no increased work of breathing, CTAB  Cardiac: RRR, no murmurs appreciated, no lower extremity edema  GI: soft, nontender to palpation, nondistended, no guarding  MSK: moving all extremities spontaneously  Neuro: Aox4, CN2-12 intact      Labs  Labs were ordered and personally reviewed. Labs were compared to prior records obtained from chart and/or outside records when available.    Recent Labs     09/19/23  1359 09/19/23  0632 09/18/23  1957   WBC 6.73 6.08 6.77   HGB 11.7* 11.4* 12.7*   HCT 35.8* 34.4* 38.5   PLT 142* 110* 118*     Recent Labs     09/19/23  1128 09/19/23  0632 09/18/23  2011 09/18/23  1957 09/18/23  0451   NA 137  --  142 141 141   K 4.0  --  4.2 4.3 4.0   CL 101  --  103 102 105   CO2 26  --  26 22 25    ANIONGAP 10  --  13 17 11    BUN 9  --  9 9 11    CREAT 0.83  --  0.77 0.76 0.72   GLUCOSE 145*  --  88 87 101*   CALCIUM 8.9  --  9.1 9.1 8.5*  MG  --  2.0* 2.3*  --  1.7      Recent Labs     09/18/23  0451 09/17/23  0434   ALT 38 44   AST 22 23   BILITOT 0.5 0.4   ALKPHOS 66 63   TOTPRO 6.5 6.2   ALBUMIN 3.5* 3.4*     Recent Labs     09/18/23  1957 09/18/23  1935 09/18/23  1159 09/18/23  0837 09/17/23  2207 09/17/23  1559 09/16/23  1918   APTT  --  46.1* 73.5* 176.3*   < > 29.9  --    PT 12.9  --   --   --   --  13.5 13.8   INR 1.0  --   --   --   --  1.0 1.1    < > = values in this interval not displayed.     No results for input(s): ''DDIMER'', ''FIBRINOGEN'' in the last 72 hours.  No results for input(s): ''TSH'', ''HGBA1C'' in the last 72 hours.  No results for input(s): ''FE'', ''TIBC'', ''FEBINDSAT'', ''FERRITIN'' in the last 72 hours.  No results for input(s): ''FOLATE'', ''VITAMINB1'', ''VITAMINB12'', ''VITAMINB6'', ''VITD125DIOH'', ''VITAMINC'', ''ZINCSER'' in the last 72 hours.  No results for input(s): ''CHOL'', ''CHOLHDL'', ''CHOLDLCAL'', ''CHOLDLQ'', ''TRIGLY'' in the last 72 hours.  Recent Labs     09/19/23  1128 09/18/23  1957 09/17/23  0651 09/16/23  1918   TROPONIN 17* 21*  -- --    LACTATE  --   --  10 19*     No results for input(s): ''PROCAL'', ''CRP'', ''SRWEST'' in the last 72 hours.  No results for input(s): ''BLDUR'', ''KETONESUR'', ''PROTUR'', ''LEUKESTUR'', ''NITRITEUR'', ''RBCHPF'', ''WBCHPF'' in the last 72 hours.  Recent Labs     09/18/23  2027   PHVEN 7.39   PCO2VEN 49   PO2VEN 29   BICARBVEN 29.9   BEVEN 4       Procedure  Upper GI endoscopy 09/17/2023 1206  FINDINGS:  Esophagus: The esophagus appeared to be normal without evidence of esophagitis, rings, furrows, strictures. There was no hiatal hernia.  Stomach: There was diffuse ulceration in the antrum, lesser curve. Most areas were 1cm clean based, there was 1 area with a flat pigmented spot vs raised vessel. Hemostasis was performed with epinephrine 1:20,000 by 0.5cc x4. 2 hemoclips were placed over   the site. the surrounding mucosa had slight nodular erythema diffusely. On retroflexion no additional findings were noted.  Duodenum: The duodenum had erythema of the duodenal bulb, otherwise normal in the 2nd portion.     Imaging  Last 24 hours of interval imaging personally reviewed and compared to prior imaging.  No imaging has been resulted in the last 24 hours    Microbiology  Last 24 hours of interval microbiology data reviewed.  Blood:  No results found for: ''BACULBLD''  No results found for: ''HEPAABIGM'', ''HEPAABTOT''  No results found for: ''HEPAABIGM'', ''HEPAABTOT''  HBs Ag   Date Value Ref Range Status   01/25/2022 Nonreactive Nonreactive Final     No results found for: ''HEPBEAB'', ''HEPBEAG''  HCV Ab Screen   Date Value Ref Range Status   01/25/2022 Nonreactive Nonreactive Final     No results found for: ''HIVQPCR''  No results found for: ''MTBQFNGOLD''    Urine:  No results found for: ''BACULUR''    Respiratory:  No results found for: ''BACULRSP''   Lab Results   Component Value Date    COVID19QLPCR Not Detected 02/11/2019  No results found for: ''RSVAGDIR''    Assessment and Plan  Andrew [name] is a 68 y.o. male who has a past medical history of bicuspid AV s/p bioprosthetic TAVR, CVA w/ residual LLE weakess (s/p TAVR) who initially presented to OSH for pre-syncopal episode found to be tachy-brady s/p PPM. Hospitalization c/b melena and HD significant SVT. Downgraded back to the hospitalist service on 6/19.      #Tachy/SVTGLENWOOD Cleaves syndrome s/p PPM (09/14/2023)  #pAfib  #Hx bicupsid AV s/p bioprosthetic AV (2013)   Initial Afib diagnosis s/p TAVR in 2013 but not placed on [facility] given likely provoked event. Then, pt w/ admissions showing recurrence of Afib and placed on AC. Had episode of SVT with aberrancy on 6/18 that resolved with amiodarone re-bolus.   - finish IV amiodarone and then resume amiodarone 400mg  BID (last day 6/26), followed by 200mg  daily  - continue metoprolol tartrate 25mg  q6H, uptitrate as tolerating  - continue apixaban 5mg  BID    #Antral ulceration s/p hemoclips x2 (09/17/2023)  #Melena d/t upper GI ulcer  Picture in chart with ongoing Hgb drop from 15 ->>> 12. EGD on 09/17/2023 found diffuse ulceration in the antrum now s/p hemoclips x2 (ok to restart anticoagulation)  - GI consulted, appreciate recs   - pantoprazole 40 mg IV BID (on dc transition to PO)  - Avoid excessive NSAIDs  -will need outpt surveillance EGD in 2 months w/ GI    # Thrombocytopenia, nPOA  Likely iso of acute bleed/illness  - CTM     [Chronic/stable]:  #HTN  - Hold home Amlodipine 5mg  d/t normotension  - Hold home losartan d/t normotension    #Hx periop CVA from AVR in (2013) s/p residual LLE weakness  #HLD   Noted to have significant functional recovery, however, still with balance issues and 4/5 strength in LLE   - PT/OT evaluation  - Atorvastatin 20 mg at bedtime  - consider resuming ASA 81 mg daily in the AM     #Osteoporosis   #Vit D- Deficiency   #HX of medial malleolus fracture   - calcium carbonate 1250 mg QAM  - vitamin-D 25 mcg daily  - CAM boot as tolerated     #Gout:   - Allopurinol 200 mg daily        Inpatient Checklist  Orders Placed This Encounter Diet 2 gram sodium    DVT prophylaxis: on apixaban  GI Prophylaxis: pantoprazole 40 mg IV BID  Lines/Tubes/Drains: Peripheral IV 22 G Anterior;Right;Medial Forearm (9)  Peripheral IV 22 G Anterior;Distal;Right Arm (8)  Peripheral IV 22 G Anterior;Left;Proximal Forearm (5)      Code Status: Full Code  Contact:  Primary Emergency Contact: Maximillian, Habibi, Home Phone: [phone]    Disposition:  - Pending stabilization of hgb and bleeding while resuming apixaban and aspirin therapies    Signed:  Harlene MICAEL Killian, MD  09/19/2023 4:03 PM

## 2023-09-20 ENCOUNTER — Ambulatory Visit: Payer: PRIVATE HEALTH INSURANCE

## 2023-09-20 ENCOUNTER — Ambulatory Visit: Payer: Commercial Managed Care - Pharmacy Benefit Manager

## 2023-09-20 LAB — CBC: MCH CONCENTRATION: 33 g/dL (ref 31.5–35.5)

## 2023-09-20 LAB — Basic Metabolic Panel
ESTIMATED GFR 2021 CKD-EPI: >89 mL/min/1.73m2 (ref 0.60–1.30)
ESTIMATED GFR 2021 CKD-EPI: >89 mL/min/1.73m2 (ref 20–30)

## 2023-09-20 LAB — Magnesium: MAGNESIUM: 1.7 meq/L (ref 1.4–1.9)

## 2023-09-20 LAB — Total Protein, Serum (PEP): TOTAL PROTEIN, SERUM: 7.1 g/dL (ref 6.1–8.2)

## 2023-09-20 MED ADMIN — TECHNETIUM TC 99M PYROPHOS IV KIT: 20 | INTRAVENOUS | @ 18:00:00 | Stop: 2023-09-20 | NDC 45567006002

## 2023-09-20 MED ADMIN — ASCORBIC ACID 500 MG PO TABS: 500 mg | ORAL | @ 17:00:00 | Stop: 2023-09-22 | NDC 00904052361

## 2023-09-20 MED ADMIN — ATORVASTATIN CALCIUM 20 MG PO TABS: 20 mg | ORAL | @ 04:00:00 | Stop: 2023-09-22 | NDC 68084009811

## 2023-09-20 MED ADMIN — AMIODARONE HCL 360 MG/200 ML DRIP RTU: .5 mg/min | INTRAVENOUS | @ 04:00:00 | Stop: 2023-09-20

## 2023-09-20 MED ADMIN — ALLOPURINOL 100 MG PO TABS: 200 mg | ORAL | @ 17:00:00 | Stop: 2023-09-22 | NDC 60687067711

## 2023-09-20 MED ADMIN — SIMETHICONE 80 MG PO CHEW: 80 mg | ORAL | @ 04:00:00 | Stop: 2023-09-22 | NDC 77333081225

## 2023-09-20 MED ADMIN — OYSTER CALCIUM 1250 (500 CA) MG PO TABS: 1250 mg | ORAL | @ 17:00:00 | Stop: 2023-09-22 | NDC 00904188361

## 2023-09-20 MED ADMIN — CHOLECALCIFEROL 25 MCG (1000 UT) PO TABS: 25 ug | ORAL | @ 17:00:00 | Stop: 2023-09-22

## 2023-09-20 MED ADMIN — APIXABAN 5 MG PO TABS: 5 mg | ORAL | @ 04:00:00 | Stop: 2023-09-22 | NDC 00003089431

## 2023-09-20 MED ADMIN — METOPROLOL TARTRATE 25 MG PO TABS: 25 mg | ORAL | @ 20:00:00 | Stop: 2023-09-21 | NDC 62584026511

## 2023-09-20 MED ADMIN — MAGNESIUM OXIDE -MG SUPPLEMENT 400 (240 MG) MG PO TABS: 800 mg | ORAL | @ 17:00:00 | Stop: 2023-09-20

## 2023-09-20 MED ADMIN — PANTOPRAZOLE SODIUM 40 MG IV SOLR: 40 mg | INTRAVENOUS | @ 04:00:00 | Stop: 2023-09-22 | NDC 71288060010

## 2023-09-20 MED ADMIN — PANTOPRAZOLE SODIUM 40 MG IV SOLR: 40 mg | INTRAVENOUS | @ 17:00:00 | Stop: 2023-09-22 | NDC 71288060010

## 2023-09-20 MED ADMIN — AMIODARONE HCL 200 MG PO TABS: 400 mg | ORAL | @ 17:00:00 | Stop: 2023-09-22 | NDC 60687043711

## 2023-09-20 MED ADMIN — APIXABAN 5 MG PO TABS: 5 mg | ORAL | @ 17:00:00 | Stop: 2023-09-22 | NDC 00003089431

## 2023-09-20 MED ADMIN — METOPROLOL TARTRATE 25 MG PO TABS: 25 mg | ORAL | @ 13:00:00 | Stop: 2023-09-21 | NDC 62584026511

## 2023-09-20 MED ADMIN — METOPROLOL TARTRATE 25 MG PO TABS: 25 mg | ORAL | @ 07:00:00 | Stop: 2023-09-21 | NDC 62584026511

## 2023-09-20 MED ADMIN — METOPROLOL TARTRATE 25 MG PO TABS: 25 mg | ORAL | @ 01:00:00 | Stop: 2023-09-21 | NDC 62584026511

## 2023-09-20 MED ADMIN — ASPIRIN 81 MG PO CHEW: 81 mg | ORAL | @ 17:00:00 | Stop: 2023-09-20 | NDC 66553000201

## 2023-09-20 NOTE — Progress Notes
 Hospitalist Progress Note    Patient Name Andrew [name]   Patient MRN [id number]   Patient DOB October 24, 1955   Patient PCP Jeana Francisco, MD   Provider Delon IVAR Passer, NP   Admission Date 09/09/2023     Chief Complaint  No chief complaint on file.    Interval Events / Subjective  Overnight: NAEO  -Afebrile, currently on room air. HDS.  -Stable Hgb after EGD/colo 11.7 to 11.6  -Resolution of thrombocytopenia  -Wife at bedside, has been having some mild bloody BMs persistently since the EGD. Reports it has not been significant but unsure whether it's normal -> GI aware  -Able to work with physical therapy today, no arrhythmias.  -Denies chest pain, cough, shortness of breath, nausea, vomiting, headaches, fever, chills, abdominal discomfort    Review of Systems  A 14 review of systems was completed. Additional symptoms were otherwise negative and/or non-contributory, except as listed above.    Medications  Scheduled:  allopurinol , 200 mg, Oral, Daily  amiodarone , 400 mg, Oral, BID **FOLLOWED BY** [START ON 09/27/2023] amiodarone , 200 mg, Oral, Daily  apixaban , 5 mg, Oral, BID  ascorbic acid , 500 mg, Oral, Daily with breakfast  atorvastatin , 20 mg, Oral, Nightly  calcium  carbonate 1250 mg (500 mg elemental), 1,250 mg, Oral, Daily with breakfast  metoprolol  tartrate, 25 mg, Oral, Q6H  pantoprazole , 40 mg, IV Push, Q12H  cholecalciferol , 25 mcg, Oral, Daily  Continuous:      PRN:  simethicone   Physical Examination  Vital Signs: Temp:  [36.2 ?C (97.2 ?F)-36.7 ?C (98.1 ?F)] 36.4 ?C (97.6 ?F)  Heart Rate:  [60-71] 71  Resp:  [16-18] 18  BP: (100-121)/(72-88) 108/81  NBP Mean:  [81-97] 90  SpO2:  [93 %-98 %] 95 %      Intake/Output Summary (Last 24 hours) at 09/20/2023 1607  Last data filed at 09/20/2023 1536  Gross per 24 hour   Intake 1288.91 ml   Output 2326 ml   Net -1037.09 ml     Weight: 86.3 kg (190 lb 4.1 oz)      Respiratory: Oxygen Therapy  SpO2: 95 %  O2 Device: None (Room air)  Flow Rate (L/min): 1 L/min General: Well-appearing male in NAD, sitting comfortably   HEENT:  pupils equal in diameter. Hearing intact grossly, MMM, no oropharyngeal erythema or exudate.  Chest: Nontender. Symmetric. No surgical scars present.  CV: Regular rate & rhythm. No m/r/g's. Extremities warm. Cap refill <2 seconds. Radial and DP 2+ bilaterally.   Lungs:  Good respiratory effort with no increased work of breathing. Chest expands symmetrically. CTAB, no wheezes/crackles  Abdomen: Soft, NT, ND, normoactive bowel sounds.  Extremities: No c/c/e. No lower extremity edema.  Skin: No rashes, warm and well perfused  Neuro: Grossly normal, AAOx3  Psych: Appropriate mood and affect    Labs  Labs were ordered and personally reviewed. Labs were compared to prior records obtained from chart and/or outside records when available.    Recent Labs     09/20/23  0620 09/19/23  1359 09/19/23  0632   WBC 7.27 6.73 6.08   HGB 11.6* 11.7* 11.4*   HCT 35.1* 35.8* 34.4*   PLT 150 142* 110*     Recent Labs     09/20/23  0620 09/19/23  1128 09/19/23  0632 09/18/23  2011   NA 139 137  --  142   K 4.1 4.0  --  4.2   CL 104 101  --  103   CO2 27  26  --  26   ANIONGAP 8 10  --  13   BUN 11 9  --  9   CREAT 0.81 0.83  --  0.77   GLUCOSE 99 145*  --  88   CALCIUM  8.8 8.9  --  9.1   MG 1.7  --  2.0* 2.3*      Recent Labs     09/19/23  1359 09/18/23  0451   ALT  --  38   AST  --  22   BILITOT  --  0.5   ALKPHOS  --  66   TOTPRO 7.1 6.5   ALBUMIN  --  3.5*     Recent Labs     09/18/23  1957 09/18/23  1935 09/18/23  1159 09/18/23  0837   APTT  --  46.1* 73.5* 176.3*   PT 12.9  --   --   --    INR 1.0  --   --   --      No results for input(s): ''DDIMER'', ''FIBRINOGEN'' in the last 72 hours.  No results for input(s): ''TSH'', ''HGBA1C'' in the last 72 hours.  No results for input(s): ''FE'', ''TIBC'', ''FEBINDSAT'', ''FERRITIN'' in the last 72 hours.  No results for input(s): ''FOLATE'', ''VITAMINB1'', ''VITAMINB12'', ''VITAMINB6'', ''VITD125DIOH'', ''VITAMINC'', ''ZINCSER'' in the last 72 hours.  No results for input(s): ''CHOL'', ''CHOLHDL'', ''CHOLDLCAL'', ''CHOLDLQ'', ''TRIGLY'' in the last 72 hours.  Recent Labs     09/19/23  1128 09/18/23  1957   TROPONIN 17* 21*     No results for input(s): ''PROCAL'', ''CRP'', ''SRWEST'' in the last 72 hours.  No results for input(s): ''BLDUR'', ''KETONESUR'', ''PROTUR'', ''LEUKESTUR'', ''NITRITEUR'', ''RBCHPF'', ''WBCHPF'' in the last 72 hours.  Recent Labs     09/18/23  2027   PHVEN 7.39   PCO2VEN 49   PO2VEN 29   BICARBVEN 29.9   BEVEN 4       Procedure  Upper GI endoscopy 09/17/2023 1206  FINDINGS:  Esophagus: The esophagus appeared to be normal without evidence of esophagitis, rings, furrows, strictures. There was no hiatal hernia.  Stomach: There was diffuse ulceration in the antrum, lesser curve. Most areas were 1cm clean based, there was 1 area with a flat pigmented spot vs raised vessel. Hemostasis was performed with epinephrine 1:20,000 by 0.5cc x4. 2 hemoclips were placed over   the site. the surrounding mucosa had slight nodular erythema diffusely. On retroflexion no additional findings were noted.  Duodenum: The duodenum had erythema of the duodenal bulb, otherwise normal in the 2nd portion.     Imaging  Last 24 hours of interval imaging personally reviewed and compared to prior imaging.  No imaging has been resulted in the last 24 hours    Microbiology  Last 24 hours of interval microbiology data reviewed.  Blood:  No results found for: ''BACULBLD''  No results found for: ''HEPAABIGM'', ''HEPAABTOT''  No results found for: ''HEPAABIGM'', ''HEPAABTOT''  HBs Ag   Date Value Ref Range Status   01/25/2022 Nonreactive Nonreactive Final     No results found for: ''HEPBEAB'', ''HEPBEAG''  HCV Ab Screen   Date Value Ref Range Status   01/25/2022 Nonreactive Nonreactive Final     No results found for: ''HIVQPCR''  No results found for: ''MTBQFNGOLD''    Urine:  No results found for: ''BACULUR''    Respiratory:  No results found for: ''BACULRSP''   Lab Results   Component Value Date    COVID19QLPCR Not Detected 02/11/2019  No results found for: ''RSVAGDIR''    Assessment and Plan  Evann Erazo is a 68 y.o. male who has a past medical history of bicuspid AV s/p bioprosthetic TAVR, CVA w/ residual LLE weakess (s/p TAVR) who initially presented to OSH for pre-syncopal episode found to be tachy-brady s/p PPM. Hospitalization c/b ongoing admission for melena.     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 Then, pt w/ admissions showing recurrence of Afib and placed on [facility]  - EP consulted, appreciate recs  - s/p amio 150 mg IV bolus/gtt on 06/19  - amiodarone  400 mg BID x 6 days (6/20-6/26) and then 200 every day after per EP  - MTP tart 25 mg Q6h  - apixaban  5 mg PO BID    #Antral ulceration s/p hemoclips x2 (09/17/2023)  #Melena d/t upper GI ulcer, resolved  Picture in chart with ongoing Hgb drop from 15 ->>> 12. EGD on 09/17/2023 found diffuse ulceration in the antrum now s/p hemoclips x2 (ok to restart anticoagulation)  - GI consulted, appreciate recs   - pantoprazole  40 mg IV BID (on dc transition to PO)  - Avoid excessive NSAIDs  - will need outpt surveillance EGD in 2 months w/ GI  - monitoring for overt rebleed      [Chronic/stable]:  #HTN  - Hold home Amlodipine  5mg  d/t normotension  - Hold home losartan  d/t normotension    #Hx periop CVA from AVR in (2013) s/p residual LLE weakness  #HLD   Noted to have significant functional recovery, however, still with balance issues and 4/5 strength in LLE   - PT/OT evaluation  - Atorvastatin  20 mg at bedtime  - HOLD ASA 81 mg daily      #Osteoporosis   #Vit D- Deficiency   #HX of medial malleolus fracture   - calcium  carbonate 1250 mg QAM  - vitamin-D 25 mcg daily  - CAM boot as tolerated     #Gout:   - Allopurinol  200 mg daily    [Resolved]:  # Thrombocytopenia, nPOA. Likely iso of acute bleed/illness        Inpatient Checklist  Orders Placed This Encounter      Diet 2 gram sodium    DVT prophylaxis: not indicated due to down trending hgb  and platelets  GI Prophylaxis: pantoprazole  40 mg IV BID worsening  Lines/Tubes/Drains: Peripheral IV 22 G Anterior;Right;Medial Forearm (10)  Peripheral IV 22 G Anterior;Left;Proximal Forearm (6)      Code Status: Full Code  Contact:  Primary Emergency Contact: Nett Lanny Ip, Home Phone: [phone]    Disposition:  - Pending stabilization of hgb and bleeding while resuming apixaban  and aspirin  therapies    Signed:  Delon IVAR Passer, NP  [facility] Direct Care Hospitalist  Pager [phone]  09/20/2023 at 4:07 PM      Discussed with attending, Jackson Dorn FALCON., MD    MEDICINE ATTENDING ADDENDUM:    DATE OF SERVICE: 09/21/2023    I have seen and examined the patient on the date of service noted above and discussed the patient's management with the resident. I have reviewed the resident's note, reviewed medications, reviewed all laboratory and diagnostic studies outlined above, and agree with the documented findings, impression, and plan of care, which we formulated together, along with my additions and/or clarifications.    Transferred back from  CCU to medicine for continued observation. Has had persistent maroon colored stools, suspect this is old blood given stability in Hb and lack of dizziness. No SVT with exertion/PT. Will consolidate metoprolol , continue amiodarone . Will need outpatient EP follow-up.    57 minutes were spent personally by me today on this encounter which include today's pre-visit review of the chart, obtaining appropriate history, performing an evaluation, documentation and discussion of management with details supported within the note for today's visit. The time documented was exclusive of any time spent on the separately billed procedure.    Dorn Sprung, MD  Hospitalist - Internal Medicine  Pager: [phone]

## 2023-09-20 NOTE — Progress Notes
 Nutrition Screen Note    Follow up. chart reviewed, visited pt.Wife at bedside.Continues with good appetite > 76% of meals.No nausea/emesis. Denies constipation. LBM today 6/20.     Nutrition will continue to follow per policy and will refer to RD as needed.       Author:  Huberta Tompkins Damaris Fredrik Mogel, DTR, pager [phone]  09/20/2023 2:15 PM

## 2023-09-20 NOTE — Consults
 IP CM ACTIVE DISCHARGE PLANNING  Department of Care Coordination      Admit Ijuz:[id number]  Anticipated Date of Discharge: 09/21/2023    Following FI:Hjmrpj, Dorn FALCON., MD      Today's short update        09/20/23 1434   MD/NP   Today's progression of care Heart Block, mmonitored for SVT, PPM placement; pending PT/OT eval.   Anticipated Consult CARDS   Case Management   Expected Disposition Home Health   Discharge Address [address]  [place] [address]   Home Health Coordination Status Referral sent-out to providers (via [facility]) (3/7);Order written (2/7);Referral under review (4/7);Agency accepted (6/7)  (Reserved with [facility])   Other Arrangements   Agency [facility]   Phone Number [phone]   Fax Number [phone]   Comments Covering CM on weekend please call [facility] to confirm prior to pt d/c         Disposition     Home w/Home Health  Durable Medical Equipment, Nursing  [address]  [place] [address]  Family/Support System in agreement with the current discharge plan: No, they are not in agreement (Comment on Barrier)               Multidisciplinary Team Member Plan of Care   Interdisciplinary rounds were conducted with the multidisciplinary team including the clinical social worker and nurse case manager. The patient's plan of care and discharge plan were discussed and formulated based on the patient's specific needs.        Home Health Coordination Status (if applicable)     (P) Referral sent-out to providers (via [facility]) (3/7), Order written (2/7), Referral under review (4/7), Agency accepted (6/7) (Reserved with [facility])                DME or RT Equipment Status (if applicable)     Referral sent-out to providers (via [facility]/[employer]) (3/7)                 Non-medical Transportation Arrangement Status (if applicable)     Patient/family secured            Other Arrangements (if applicable) Agency: (P) [facility]     Phone Number: (P) [phone]  Fax Number: (P) [phone]  Comments: (P) Covering CM on weekend please call [facility] to confirm prior to pt d/c        PASRR   N/A                          Darina LABOR Rohaan Durnil,  09/20/2023      Darina Slight  RN, BSN, PMH-BC, MBA  Department of Care Coordination and Clinical Social Work   Medicine Case Manager  Primary Team: Medicine Team 7 & Med Obs    T: [phone]  Pager: [phone]  [phone] ID  Email  [email or web address]          Swing Shift CM (After Hours - Mon-Fri 5-10:30PM):   Rollene Dimes [phone], [phone] pgr, Office: [phone])  Ethlyn Ethlyn Chihuahua [phone], [phone] pgr, Office: [phone])    When Primary or Swing Shift CMs are not available, then contact ED CM (Office: [phone]: [phone]);   ED Social Work Office (ph: [phone]/pager: [phone]).    If case management assistance is required during the weekend, page [phone] for the  house weekend CM.   Weekend Social Work assistance page [phone] or ED SW [phone].

## 2023-09-20 NOTE — Progress Notes
 Occupational Therapy Treatment    PATIENT: Andrew [name]  MRN: [id number]    Treatment Date: 09/20/2023    Patient Presentation: Position: In bed;Family/CG present (Spouse at bedside)  Lines/devices Drains: HLIV;Cardiac Monitor;Pulse Ox    Pertinent Updates: Overnight 6/19, patient had Code Blue called for episodes of regular wide complex tachycardia. Patient was alert and oriented and pulses were intact throughout episodes. He frequently would convert in and out of an aberrant SVT with MAPs <60s during these episodes. This lasted for no longer than 1 minute and self terminated on its own. Patient started on amiodarone  and HR stabilized in 60s.    Precautions   Precautions: Fall risk;Monitor Vitals;Check Labs  Orthotic: Left;AFO;Right;CAM/walking Boot  Current Activity Order: Activity as tolerated  Weight Bearing Status: Not Applicable    Cognition   Cognition: Exceptions to WDL or Baseline Status  Arousal/Alertness: Appropriate responses to stimuli  Attention Span: Attends with cues to redirect  Following Commands: Follows one step commands with increased time;Follows one step commands with repetition  Problem Solving: Assistance required to identify errors made  Sequencing: Intact  Initiation: Good  Safety Awareness: Fair awareness of safety precautions  Barriers to Learning: None    Bed Mobility   Rolling: Contact Guard Assist;to Right;Verbal Cueing  Supine to Sit: Minimum Assist;to Right;Verbal Cueing  Sit to Supine: Not Performed    Functional Transfers   Sit to Stand: Minimum Assist;Assistive Device (Comment) (x1 from EOB, x1 from chair with spouse assisting. Both using FWW.)  Transfer: From;Bed;To;Toilet;Chair  Level of Assist: Minimum Assist;Verbal Cueing  Type of Transfer: Stand step;Front wheeled walker (VC for navigation of FWW in small space (bathroom))  Toilet Transfers: Minimum Assist (VC for hand placement 2/2 PPM precautions, used FWW in front of pt with wife stabilizing walker, as opposed to L GB to maintain PPM precautions.)  Functional Mobility: Minimum Assist;Front wheeled walker (Within room. Pt with decreased hip/knee flexion during LLE steps.)    Activities of Daily Living (ADLs)   LB Dressing: Performed (Don L AFO, R walking boot)  LB Dressing Assistance: Maximum Assist (Spuose and OT assisted)  LB Dressing Deficit: Don/doff R shoe;Don/doff L shoe  LB Dressing Adaptive Equipment: None  LB Dressing Where Assessed: Edge of bed  Toileting: Performed  Toileting Assistance: Print production planner Deficit: Increased time to complete  Toileting Where Assessed: Toilet    Balance   Sitting - Static: Good;with UE support  Sitting - Dynamic: Good;with UE support     AM-PAC   AM-PAC Daily Activity Raw Score: 17  AM-PAC Daily Activity t-Scale Score: 37.26  AM-PAC Daily Activity CMS 0-100% Score: 50.11 %  AM-PAC Daily Activity CMS 'G Code' Modifier: CK    Pain Assessment   Patient complains of pain: No     Patient Status   Activity Tolerance: Good  Oxygen Needs: Room Air  Response to Treatment: Tolerated treatment well;Vital signs stable;with activity;Nursing notified (HR 70s/80s throughout)  Compliance with Precautions: Fair (PPM - required reminders throughout to minimize pushing through LUE)  Call light in reach: Yes  Presentation post treatment: Up in chair;On cardiac monitor;Lines/drains intact;Pulse Ox    Interdisciplinary Communication   Interdisciplinary Communication: Nurse;Physical Therapist    Treatment Plan   Continue OT Treatment Plan with Focus on: ADL training;Functional mobility training;Therapeutic exercise;Patient/family/caregiver education and training;Assistive device training    OT Recommendations   Discharge Recommendation: Occupational Therapy;Would benefit from continued therapy  Discharge concerns: Requires assistance for mobility;Requires assistance for self care  Discharge Equipment  Recommended: Commode    Treatment Completed by: Wells Donnelly Ada, OT

## 2023-09-20 NOTE — Consults
 Inpatient Cardiac Electrophysiology Consultation    DATE OF SERVICE: 09/20/2023  PATIENT: Andrew [name]   MRN: [id number]  DOB: [birth year]    REASON FOR CONSULTATION: Tachy-brady syndrome    75M admitted for further management of tachy brady syndrome    MEDICAL PROBLEMS  Tachy-brady syndrome  Status post implantation of a dual chamber pacemaker (Medtronic Azure) September 14, 2023  Supraventricular tachycardia  Paroxysmal atrial fibrillation (CHADSVASc score = 4 due to age, stroke, and hypertension)  Anticoagulation: apixaban  5mg  twice daily  Rate control: diltiazem and previously metoprolol   History of bicuspid aortic valve stenosis  Status post surgical, bioprosthetic aortic valve replacement January 2013 [facility] Ease #25; Dr. Talitha; [facility])  Postoperative stroke, with residual left-sided and right lower extremity weakness    HISTORY OF PRESENT ILLNESS:    Index diagnosis of arrhythmia was that of paroxysmal atrial fibrillation following elective surgical bioprosthetic aortic valve replacement at [facility] in 2013; he did not have recurrence following this isolated episode and thus was not managed with antiarrhythmic nor anticoagulation regimen.      He presented to the hospital twice more in May 2025, once for treatment of dyspnea and suspected heart failure, and again for palpitation, where he was diagnosed with recurrence of atrial fibrillation and rapid ventricular rates; this was managed with diltiazem and apixaban . As he re-presented for palpitation and dizziness on Aug 31, 2023 to [facility]) he was treated with IV diltiazem and found to have subsequent pauses up to 5 seconds' duration. He does not recall symptoms, but rather than he was given a diagnosis of tachy-brady syndrome and recommended to undergo pacemaker placement.      Initiated on amiodarone  09/10/2023 for treatment of atrial arrhythmia; subsequently had recurrence of SVT (likely AT) with symptomatic conversion pause.     Underwent pacemaker implantation 09/14/2023 without complication. Thereafter had recurrence of atrial arrhythmia.       PAST MEDICAL HISTORY:  Past Medical History:   Diagnosis Date    CVA (cerebral vascular accident) (HCC/RAF)     H/O aortic valve replacement     Kidney stone     Urinary frequency        PAST SURGICAL HISTORY:  Past Surgical History:   Procedure Laterality Date    AORTIC VALVE REPLACEMENT         FAMILY HISTORY:  Family History   Problem Relation Age of Onset    No Known Problems Mother     Other (gastric ulcer) Father     Uterine cancer Sister     Stroke Brother     Kidney failure Brother     Colon cancer Neg Hx     Lung cancer Neg Hx     Prostate cancer Neg Hx        SOCIAL HISTORY:  Social History     Socioeconomic History    Marital status: Married   Tobacco Use    Smoking status: Former     Current packs/day: 0.00     Average packs/day: 1 pack/day for 15.0 years (15.0 ttl pk-yrs)     Types: Cigarettes     Start date: 05/03/1975     Quit date: 05/02/1990     Years since quitting: 33.4    Smokeless tobacco: Never   Substance and Sexual Activity    Alcohol use: Not Currently     Comment: occasionally has red wine    Drug use: Not Currently     Social Drivers of  Health     Financial Resource Strain: Low Risk  (09/10/2023)    Financial Resource Strain     Difficulty of Paying Living Expenses: Not very hard       OUTPATIENT MEDICATIONS:  Current Facility-Administered Medications   Medication Dose Route Frequency    allopurinol  tab 200 mg  200 mg Oral Daily    [COMPLETED] amiodarone  150 mg in dextrose 100 mL IVPB RTU 150 mg  150 mg Intravenous Once    Followed by    [EXPIRED] amiodarone  360 mg in dextrose 200 mL drip RTU (peripheral)  1 mg/min Intravenous Continuous    Followed by    [EXPIRED] amiodarone  360 mg in dextrose 200 mL drip RTU (peripheral)  0.5 mg/min Intravenous Continuous    [COMPLETED] amiodarone  150 mg/3 mL inj    PRN    amiodarone  tab 400 mg  400 mg Oral BID apixaban  tab 5 mg  5 mg Oral BID    ascorbic acid  tab 500 mg  500 mg Oral Daily with breakfast    aspirin  chew tab 81 mg  81 mg Oral Daily    atorvastatin  tab 20 mg  20 mg Oral Nightly    calcium  carbonate 1250 mg (500 mg elemental) tab 1,250 mg  1,250 mg Oral Daily with breakfast    [EXPIRED] heparin  25,000 units in 0.45% NaCl 250 mL drip RTU  260 Units/hr Intravenous Continuous    [EXPIRED] heparin  per pharmacy   Does not apply Continuous    [COMPLETED] magnesium  sulfate 2 g in water for injection 50 mL RTU  2 g Intravenous Once    [COMPLETED] magnesium  sulfate 2 g in water for injection 50 mL RTU  2 g Intravenous Once    [COMPLETED] magnesium  sulfate 2 g in water for injection 50 mL RTU  2 g Intravenous Once    [COMPLETED] magnesium  sulfate 2 g in water for injection 50 mL RTU  2 g Intravenous Once    [COMPLETED] magnesium  sulfate 4 g in water for injection 100 mL RTU  4 g Intravenous Once    [COMPLETED] magnesium  sulfate 4 g in water for injection 100 mL RTU  4 g Intravenous Once    metoprolol  tartrate tab 25 mg  25 mg Oral Q6H    pantoprazole  inj 40 mg  40 mg IV Push Q12H    [COMPLETED] perflutren  lipid microspheres (Definity ) 6.52 mg/mL inj 1.5 mL  1.5 mL Intravenous Once    [COMPLETED] potassium chloride  CR tab 40 mEq  40 mEq Oral Once    simethicone  chew tab 80 mg  80 mg Oral QID PRN    [COMPLETED] sodium chloride  0.9% IV soln bolus 500 mL  500 mL Intravenous Once    [COMPLETED] sodium chloride  0.9% IV soln bolus 500 mL  500 mL Intravenous Once    [COMPLETED] sodium chloride  0.9% IV soln        [COMPLETED] vancomycin  (Vancocin ) 500 mg in sodium chloride  0.9% 500 mL 1 mg/mL irrigation soln    Continuous PRN    [COMPLETED] vancomycin  1 g in dextrose 200 mL IVPB RTU  1 g Intravenous Once    vitamin D (cholecalciferol ) tab 25 mcg  25 mcg Oral Daily    [DISCONTINUED] acetaminophen  IV inj 1,000 mg  1,000 mg Intravenous Once PRN    [DISCONTINUED] amiodarone  tab 200 mg  200 mg Oral BID    [DISCONTINUED] amLODIPine  tab 5 mg  5 mg Oral Daily    [DISCONTINUED] apixaban  tab 5 mg  5  mg Oral BID    [DISCONTINUED] aspirin  chew tab 81 mg  81 mg Oral Daily    [DISCONTINUED] dermabond mini skin adhesive    PRN    [DISCONTINUED] heparin  25,000 units in 0.45% NaCl 250 mL drip RTU  910 Units/hr Intravenous Continuous    [DISCONTINUED] heparin  25,000 units in 0.45% NaCl 250 mL drip RTU  910 Units/hr Intravenous Continuous    [DISCONTINUED] heparin  25,000 units in 0.45% NaCl 250 mL drip RTU  710 Units/hr Intravenous Continuous    [DISCONTINUED] heparin  per pharmacy   Does not apply Continuous    [DISCONTINUED] lidocaine  PF 1% inj    PRN    [DISCONTINUED] losartan  tab 12.5 mg  12.5 mg Oral Daily    [DISCONTINUED] losartan  tab 25 mg  25 mg Oral BID    [DISCONTINUED] losartan  tab 25 mg  25 mg Oral Daily    [DISCONTINUED] metoprolol  tartrate tab 12.5 mg  12.5 mg Oral QID    [DISCONTINUED] pantoprazole  inj 40 mg  40 mg IV Push Q24H    [DISCONTINUED] pantoprazole  inj 40 mg  40 mg IV Push Q12H    [DISCONTINUED] Patient Transfer (Notification to Pharmacy)-REQUIRED   Does not apply Pharmacy Communication    [DISCONTINUED] Patient Transfer (Notification to Pharmacy)-REQUIRED   Does not apply Pharmacy Communication    [DISCONTINUED] polyethylene glycol pwd pkt 17 g  17 g Oral Daily PRN    [DISCONTINUED] prochlorperazine 10 mg/2 mL inj 10 mg  10 mg Intravenous Once PRN    [DISCONTINUED] senna tab 1 tablet  1 tablet Oral QHS PRN    [DISCONTINUED] sodium chloride  0.9% IV soln bolus 250 mL  250 mL Intravenous Once    [DISCONTINUED] sodium chloride  0.9% IV soln bolus 500 mL  500 mL Intravenous Once    [DISCONTINUED] sodium chloride  0.9% IV soln   Intravenous PRN     Facility-Administered Medications Ordered in Other Encounters   Medication Dose Route Frequency    [DISCONTINUED] acetaminophen  IV inj   Intravenous PRN    [DISCONTINUED] ceFAZolin  inj   Intravenous PRN    [DISCONTINUED] fentaNYL  (PF) 50 mcg/mL inj   Intravenous PRN    [DISCONTINUED] lidocaine  (Cardiac) 100 mg/5 mL inj   Intravenous PRN    [DISCONTINUED] lidocaine  PF 1% inj   Intravenous PRN    [DISCONTINUED] ondansetron  4 mg/2 mL inj   Intravenous PRN    [DISCONTINUED] ondansetron  4 mg/2 mL inj   Intravenous PRN    [DISCONTINUED] phenylephrine  10 mg/mL inj   Intravenous PRN    [DISCONTINUED] phenylephrine  10 mg/mL inj   Intravenous PRN    [DISCONTINUED] Plasma-Lyte -A pH 7.4 IV soln   Intravenous Continuous PRN    [DISCONTINUED] Plasma-Lyte -A pH 7.4 IV soln   Intravenous Continuous PRN    [DISCONTINUED] propofol  200 mg/20 mL inj   Intravenous Continuous PRN    [DISCONTINUED] propofol  200 mg/20 mL inj   Intravenous Continuous PRN    [DISCONTINUED] propofol  200 mg/20 mL inj   Intravenous PRN        ALLERGIES:  No Known Allergies    INPATIENT SCHEDULED MEDICATIONS:   allopurinol   200 mg Oral Daily    amiodarone   400 mg Oral BID    apixaban   5 mg Oral BID    ascorbic acid   500 mg Oral Daily with breakfast    aspirin   81 mg Oral Daily    atorvastatin   20 mg Oral Nightly    calcium  carbonate 1250 mg (500 mg elemental)  1,250 mg  Oral Daily with breakfast    metoprolol  tartrate  25 mg Oral Q6H    pantoprazole   40 mg IV Push Q12H    cholecalciferol   25 mcg Oral Daily       PHYSICAL EXAMINATION:  VITALS: BP 111/79  ~ Pulse 63  ~ Temp 36.4 ?C (97.5 ?F) (Oral)  ~ Resp 18  ~ Wt 190 lb 4.1 oz (86.3 kg)  ~ SpO2 95%  ~ BMI 30.71 kg/m?    Temp:  [36.2 ?C (97.2 ?F)-36.7 ?C (98.1 ?F)] 36.4 ?C (97.5 ?F)  Heart Rate:  [60-64] 63  Resp:  [16-18] 18  BP: (103-121)/(72-88) 111/79  NBP Mean:  [84-97] 89  SpO2:  [94 %-98 %] 95 %        Intake/Output Summary (Last 24 hours) at 09/20/2023 0803  Last data filed at 09/20/2023 0610  Gross per 24 hour   Intake 1711.91 ml   Output 1650 ml   Net 61.91 ml       Exam:   General: No acute distress  HEENT: no overt masses nor deviation. JVP not assessed.   CV: Regular rate by peripheral exam.   Resp: No frank wheezes nor crackles.  GI: nondistended    MSK: No significant edema  Neuro: grossly non focal      LABORATORY:  Lab Results   Component Value Date    WBC 7.27 09/20/2023    HGB 11.6 (L) 09/20/2023    HCT 35.1 (L) 09/20/2023    MCV 86.2 09/20/2023    PLT 150 09/20/2023    Lab Results   Component Value Date    K 4.1 09/20/2023    MG 1.7 09/20/2023    CREAT 0.81 09/20/2023    Lab Results   Component Value Date    ALT 38 09/18/2023    AST 22 09/18/2023    ALKPHOS 66 09/18/2023    BILITOT 0.5 09/18/2023      Lab Results   Component Value Date    INR 1.0 09/18/2023    APTT 46.1 (H) 09/18/2023    Lab Results   Component Value Date    TSH 4.1 09/10/2023    Lab Results   Component Value Date    HGBA1C 6.4 (H) 05/25/2023     Lab Results   Component Value Date    TROPONIN 17 (H) 09/19/2023         DIAGNOSTIC STUDIES:    Telemetry review 09/18/2023 at 18:36: initiation of SVT with PAC:      Termination of same episode after approximately 20 seconds' duration (ends with a ventricular complex):       12-lead ECG 09/19/2023 at 04:45: sinus rhythm at 68 bpm, PR interval , and QRS duration :       TTE 08/08/2023: LV EF 60-65% with normal LV size and moderate concentric LVH.        ASSESSMENT:    54M admitted for further management of tachy brady syndrome    MEDICAL PROBLEMS  Tachy-brady syndrome  Status post implantation of a dual chamber pacemaker (Medtronic Azure) September 14, 2023  Supraventricular tachycardia  Paroxysmal atrial fibrillation (CHADSVASc score = 4 due to age, stroke, and hypertension)  Anticoagulation: apixaban  5mg  twice daily  Rate control: diltiazem and previously metoprolol   History of bicuspid aortic valve stenosis  Status post surgical, bioprosthetic aortic valve replacement January 2013 [facility] Ease #25; Dr. Talitha; [facility])  Postoperative stroke, with residual left-sided and right lower extremity weakness  DISCUSSION:    Cardiac Electrophysiology was consulted for continued management of symptomatic supraventricular tachycardia. Following uncomplicated pacemaker implantation for tachy-brady syndrome, favor use of antiarrhythmic and rate control medication as tolerated.     As outpatient, EP study and possible ablation can be considered if SVT recurs despite use of medication.     RECOMMENDATIONS:    Continue amiodarone  infusion, and as early as 09/20/2023, transition to oral. Recommend 400mg  twice daily ending 09/26/2023, followed by 200mg  daily thereafter.   Continue metoprolol  and up-titrate as tolerated; recommend consolidation into twice-daily dosing prior to discharge.   Will follow up in Cardiac Arrhythmia clinic for routine device interrogation and for SVT management.     Bebe Farr, MD  Clinical Cardiac Electrophysiology Fellow    ATTENDING ADDENDUM:  I agree with the findings, examination, assessment, and plan as documented by the fellow physician, and verified the documentation above, with edits as appropriate.    Jenetta Lease, MD  Cardiac Electrophysiology

## 2023-09-20 NOTE — Other
 [name] Clinical Goal:   Clinical Goal(s) for the Shift: VSS, stable cardiac rhythm, no cp/sob/nv/dizziness/pain, monitor and replete lytes prn, activity as tolerated, safety and comfort  Identify possible barriers to advancing the care plan:   Stability of the patient: Moderately Unstable - medium risk of patient condition declining or worsening    Progression of [name] Clinical Goal: Aox4, HR 60-70s, SBP 100-120s, afebrile, SP 93% on RA. A paced w/ occasional PVCs noted on the monitor. Pt denies cp/sob/nv/dizziness/pain. Mg 1.7 repleted w/ Mg oxide 800 mg PO. Pt ambulated in room supervised and out of bed in chair. Net shift I/O: -476 mL. Wife at bedside. Safety and comfort maintained.    Plan:  PT/OT  DC planning

## 2023-09-20 NOTE — Other
 [name] Clinical Goal:   Clinical Goal(s) for the Shift: VSS, stable cardiac rhythm, stop amio gtt as ordered, WBAT, replete lytes prn, monitor i/o, maintain safety and comfort  Identify possible barriers to advancing the care plan: none  Stability of the patient: Moderately Unstable - medium risk of patient condition declining or worsening    Progression of [name] Clinical Goal: AOx4, afebrile, SBP 120s , HR 60s , SpO2 >94% on RA, A-paced w/ BBB on monitor, denies cp/sob/nv/dizziness. Amiodarone  gtt stopped as ordered. Pending AM labs. I/O net -691.1 . Adequate rest provided. Call light placed within reach. Maintained safety and comfort.    Plan:  NM Cardiac Amyloidosis  To start Amiodarone  tab 400mg  BID @ 0900

## 2023-09-21 LAB — Helicobacter pylori Antigen, Stool: H. PYLORI ANTIGEN,STOOL: NEGATIVE

## 2023-09-21 LAB — Basic Metabolic Panel
CHLORIDE: 105 mmol/L (ref 96–106)
POTASSIUM: 4.3 mmol/L (ref 3.6–5.3)

## 2023-09-21 LAB — Magnesium: MAGNESIUM: 1.7 meq/L (ref 1.4–1.9)

## 2023-09-21 LAB — Kappa/Lambda Quantitative Free Light Chains with Ratio, Serum: LAMBDA QUANTITATIVE FREE LIGHT CHAINS, SERUM: 30.75 mg/L — ABNORMAL HIGH (ref 5.71–26.30)

## 2023-09-21 LAB — CBC: MEAN CORPUSCULAR HEMOGLOBIN: 28.5 pg (ref 26.4–33.4)

## 2023-09-21 MED ORDER — AMIODARONE HCL 200 MG PO TABS
ORAL_TABLET | ORAL | 0 refills | 30.00000 days | Status: SS
Start: 2023-09-21 — End: 2023-09-30
  Filled 2023-09-22: qty 48, 30d supply, fill #0

## 2023-09-21 MED ORDER — PANTOPRAZOLE SODIUM 40 MG PO TBEC
40 mg | ORAL_TABLET | Freq: Two times a day (BID) | ORAL | 0 refills | 30.00000 days | Status: AC
Start: 2023-09-21 — End: ?
  Filled 2023-09-22 (×2): qty 60, 30d supply, fill #0

## 2023-09-21 MED ORDER — METOPROLOL TARTRATE 50 MG PO TABS
50 mg | ORAL_TABLET | Freq: Two times a day (BID) | ORAL | 0 refills | 30.00000 days | Status: AC
Start: 2023-09-21 — End: ?
  Filled 2023-09-22: qty 60, 30d supply, fill #0

## 2023-09-21 MED ADMIN — AMIODARONE HCL 200 MG PO TABS: 400 mg | ORAL | @ 03:00:00 | Stop: 2023-09-22 | NDC 60687043711

## 2023-09-21 MED ADMIN — SODIUM CHLORIDE 0.9 % IV SOLN: @ 17:00:00 | Stop: 2023-09-21 | NDC 00338004948

## 2023-09-21 MED ADMIN — METOPROLOL TARTRATE 25 MG PO TABS: 25 mg | ORAL | @ 13:00:00 | Stop: 2023-09-21 | NDC 62584026511

## 2023-09-21 MED ADMIN — METOPROLOL TARTRATE 25 MG PO TABS: 25 mg | ORAL | @ 01:00:00 | Stop: 2023-09-21 | NDC 62584026511

## 2023-09-21 MED ADMIN — METOPROLOL TARTRATE 25 MG PO TABS: 25 mg | ORAL | @ 07:00:00 | Stop: 2023-09-21 | NDC 62584026511

## 2023-09-21 MED ADMIN — PANTOPRAZOLE SODIUM 40 MG IV SOLR: 40 mg | INTRAVENOUS | @ 03:00:00 | Stop: 2023-09-22 | NDC 71288060010

## 2023-09-21 MED ADMIN — METOPROLOL TARTRATE 50 MG PO TABS: 50 mg | ORAL | @ 20:00:00 | Stop: 2023-09-22 | NDC 51079080101

## 2023-09-21 MED ADMIN — OYSTER CALCIUM 1250 (500 CA) MG PO TABS: 1250 mg | ORAL | @ 17:00:00 | Stop: 2023-09-22 | NDC 00904188361

## 2023-09-21 MED ADMIN — PANTOPRAZOLE SODIUM 40 MG IV SOLR: 40 mg | INTRAVENOUS | @ 17:00:00 | Stop: 2023-09-22 | NDC 71288060010

## 2023-09-21 MED ADMIN — CHOLECALCIFEROL 25 MCG (1000 UT) PO TABS: 25 ug | ORAL | @ 17:00:00 | Stop: 2023-09-22

## 2023-09-21 MED ADMIN — MAGNESIUM SULFATE 4 GM/100ML IV SOLN: 4 g | INTRAVENOUS | @ 17:00:00 | Stop: 2023-09-21 | NDC 00338171540

## 2023-09-21 MED ADMIN — APIXABAN 5 MG PO TABS: 5 mg | ORAL | @ 17:00:00 | Stop: 2023-09-22 | NDC 00003089431

## 2023-09-21 MED ADMIN — ATORVASTATIN CALCIUM 20 MG PO TABS: 20 mg | ORAL | @ 03:00:00 | Stop: 2023-09-22 | NDC 68084009811

## 2023-09-21 MED ADMIN — AMIODARONE HCL 200 MG PO TABS: 400 mg | ORAL | @ 17:00:00 | Stop: 2023-09-22 | NDC 60687043711

## 2023-09-21 MED ADMIN — ALLOPURINOL 100 MG PO TABS: 200 mg | ORAL | @ 17:00:00 | Stop: 2023-09-22 | NDC 60687067711

## 2023-09-21 MED ADMIN — ASCORBIC ACID 500 MG PO TABS: 500 mg | ORAL | @ 17:00:00 | Stop: 2023-09-22 | NDC 00904052361

## 2023-09-21 MED ADMIN — APIXABAN 5 MG PO TABS: 5 mg | ORAL | @ 03:00:00 | Stop: 2023-09-22 | NDC 00003089431

## 2023-09-21 NOTE — Other
 [name] Clinical Goal:   Clinical Goal(s) for the Shift: VSS, stable cardiac rhythm, no cp/sob/nv/dizziness/pain, monitor and replete lytes prn, activity as tolerated, safety and comfort  Identify possible barriers to advancing the care plan:   Stability of the patient: Moderately Unstable - medium risk of patient condition declining or worsening    Progression of [name] Clinical Goal: Aox4, HR 60s, SBP 90-120s, afebrile, SP 93% on RA. A paced w/ occasional PVCs noted on the monitor. Pt reported redness/blood in stool, MD Jackson notified and clinical photo uploaded. Pt denies cp/sob/nv/dizziness/pain. Mg 1.7 repleted w/ Mg 4 gram IVPB. Pt ambulated in room supervised and out of bed in chair. Net shift I/O: -196 mL. Wife at bedside. Safety and comfort maintained.     Pt discharged @ 1630 off unit via wheelchair with all belongings accompanied by wife. Medications delivered to bedside by discharge pharmacist. Commode delivered to bedside. AVS education provided and PIVs removed. MD Jackson informed EKG sticker (previously placed for cardiac monitoring prior to shift) on corner of PPM dressing remained on pt to ensure dressing remains intact.

## 2023-09-21 NOTE — Discharge Summary
 Discharge Summary   Name: Andrew [name] MRN: [id number] DOB: 30-Aug-1955   Admit Date: 09/09/2023 D/C Date:   09/21/2023 LOS:   12 days    Admit Attending: Victory HERO. Myna, MD Discharge Attending: Jackson Dorn FALCON., MD    PCP: Jeana Francisco, MD Discharge Provider: Dorn PHEBE Jackson, MD     Inpatient Care Team/ Consults:  CCU/Cardiology  EP  GI  Neuro/stroke Outpatient Care Team  No care team member to display     Admission Diagnosis:  (reason for admission)  #Cardiac syncope  #Tachy-brady syndrome  #pAF  #pSVT  #H/o bicuspid AV stenosis s/p SAVR 04/2011  #Postoperative CVA Discharge Diagnosis:  (conditions treated during hospitalization)  #Cardiac syncope  #Tachy-brady syndrome now s/p PPM  #pAF, on Eliquis   #pSVT, amio and MTP  #H/o bicuspid AV stenosis s/p SAVR 04/2011  #Postoperative CVA, stop ASA at d/c  #PUD  #UGIB, nPOA, resolved  #Acute on chronic blood loss anemia, stable  #Large antral ulcer s/p hemoclip   Disposition:  Home or Self care      Discharge Condition:  stable       HPI:   On 07/30/2023 presented to ED with chest pain x 2 weeks, worsens at night, SOB, weakness, elevated BP at home. Patient also endorsed an episode of dizziness, palpitations, and pre-syncope.  ED workup was unrevealing with troponin flat at 15 x2, BNP normal, and EKG stable. Was discharged with cardiology follow-up.     On 5/19 patient began to experience spontaneous light-headedness/dizziness, sweating, shortness of breath. He also had low blood pressure ( 95/72). He was brought to [facility] where they did chest x-ray and saw fluid build-up in lungs. During this hospitalization, he was kept on monitor and was found to have episodes of tachycardia and bradycardia that corresponded with his dizziness symptoms. Blood pressures were labile within these episodes. Patient was started on apixaban  and amiodarone . Since then, he has since stopped amiodarone  and been trialed on diltiazem which was also dc'd. Patient currently on metoprolol  succ 12.5mg  BID.     On 6/1, patient had another similar episode of dizziness and palpitations. Pts wife called 911 and he was taken to [facility] due irregular heart beats. During this hospitalization, pt was diagnosed with tachy-brady SSS with multiple sinus pauses, longest 5 seconds and bifascicular block. MD at [facility] recommended pacemaker placement but lack capabilities, recommended transfer to [facility].    Hospital Course:    Andrew [name] was admitted for workup and treatment of cardiac syncope 2/2 tachy-brady syndrome/SSS. He underwent a successful PPM placement on 09/14/2023. His stay was complicated by melena. He underwent an EGD on 09/17/2023 which sowed an antral ulceration s/p hemoclips x2. Hemoglobin remained stable. Patient had two episodes of SVT requiring CCU observations and was started and will continue an amiodarone  load as outpatient. Neurology was consulted and ok'ed holding ASA while on apixaban  to minimize risk of bleeding. He will follow-up with EP in clinic this week and GI in ~4 weeks to discuss timing of repeat EGD. We will refer him back to neurology to discuss long-term plan with anti-platelet therapy. Strict ED return precautions were discussed with patient as well as close PCP follow-up and outpatient CBC this week.    By Problem:  #Tachy/SVT- Brady syndrome s/p PPM (09/14/2023)  #pAfib  #Hx bicupsid AV s/p bioprosthetic AV (2013)   Initial Afib diagnosis s/p TAVR in 2013 but not placed on [facility] given likely provoked event. Then, pt w/  admissions showing recurrence of Afib and placed on [facility] 08/2023.  - EP consulted, appreciate recs  - s/p amio 150 mg IV bolus/gtt on 06/19  - amiodarone  400 mg BID x 6 days (6/20-6/26) and then 200 every day after per EP  - MTP tart 50mg  BID  - apixaban  5 mg PO BID  - ED return precautions discussed with patient/family re: sx of recurrent GIB or tachycardia  - follow-up EP as scheduled for wound check #PUD  #Antral ulceration s/p hemoclips x2 (09/17/2023)  #Melena d/t upper GI ulcer, resolved  Hb stable after hemostasis.  - pantoprazole  40 mg BID x at last 30d or until GI follow-up  - Avoid NSAIDs  - will need outpt surveillance EGD in 2 months w/ GI  - holding ASA - ok per neuro  - GI follow-up requested for ~4 weeks      #HTN  - Hold home Amlodipine  5mg  d/t normotension  - Hold home losartan  d/t normotension     #Hx periop CVA from AVR in (2013) s/p residual LLE weakness  #HLD   Noted to have significant functional recovery, however, still with balance issues and 4/5 strength in LLE   - HH PT/OT  - Atorvastatin  20 mg at bedtime  - HOLD ASA 81 mg daily - ok per neuro     #Osteoporosis   #Vit D- Deficiency   #HX of medial malleolus fracture   - calcium  carbonate 1250 mg QAM  - vitamin-D 25 mcg daily  - CAM boot as tolerated      #Gout:   - Allopurinol  200 mg daily     [Resolved]:  # Thrombocytopenia, nPOA. Likely iso of acute bleed/illness    Outpatient Provider To-Do List  -- Follow-up repeat CBC  -- Ensure EP follow-up (scheduled)  -- GI and neuro referrals requested        LACE+ Score: 45 (09/21/23 0801) Moderate Risk of Readmission  Better Outcomes by Optimizing Safe Transitions - GERIATRIC CONSIDERATIONS     This patient meets criteria for the following BOOST risk conditions which could lead to readmission. Please consider further evaluation or action on the potential barriers below:      Health Literacy & Communication:                Physical Limitation:       Morse Fall Risk Screen: High risk for falls    Disposition:    PT Concerns: Requires assistance for mobility, Requires assistance for self care  Pt Recommends: Owns recommended DME, Physical Therapy, Would benefit from continued therapy         Pending Labs   The following tests have been collected, but not yet resulted at the time of discharge.    Unresulted Labs (From admission, onward)       Start     Ordered    09/19/23 1400  PEP & Total Protein,Serum  [[id number]]  Once,   Routine        References:    [place] Test Directory Information    09/19/23 0939    09/19/23 1400  PEP,Urine,Random  [[id number]]  Once,   Routine        References:    [place] Test Directory Information    09/19/23 0939    09/19/23 1400  PEP,Serum  [[id number]]  PROCEDURE ONCE,   Routine        References:    [facility] Test Directory Information    09/19/23 1124  The following labs have a preliminary result at the time of discharge.  Please check back if the final results have changed.  Preliminary Resulted Labs (From admission, onward)      None          Discharge Medication List  Coumadin and Opiate Risk Assessment   This patient does not have coumadin on their discharge med list  No controlled substances on discharge med list      Changes To My Medications      PAUSE taking these medications     aspirin  81 mg EC tablet; Wait to take this until your doctor or other   care provider tells you to start again.; Dose: 81 mg; Take 1 tablet (81 mg   total) by mouth daily.; Quantity: 90 tablet; Refills: 2     START taking these medications     amiodarone  200 mg tablet; Take 2 tablets (400 mg total) by mouth two (2)   times daily for 6 days, THEN 1 tablet (200 mg total) daily for 24 days.;   Quantity: 48 tablet; Refills: 0; Start taking on: September 21, 2023   metoprolol  tartrate 50 mg tablet; Dose: 50 mg; Take 1 tablet (50 mg   total) by mouth two (2) times daily.; Quantity: 60 tablet; Refills: 0;   Commonly known as: Lopressor      CHANGE how you take these medications     pantoprazole  40 mg DR tablet; Dose: 40 mg; Take 1 tablet (40 mg total)   by mouth two (2) times daily.; Quantity: 60 tablet; Refills: 0; Commonly   known as: Protonix ; What changed: medication strength, how much to take,   when to take this     CONTINUE taking these medications     allopurinol  100 mg tablet; Dose: 200 mg; TAKE 2 TABLETS BY MOUTH EVERY   DAY; Quantity: 180 tablet; Refills: 1; Commonly known as: Zyloprim    amoxicillin 500 mg capsule; Use as directed prior to dental appointment;   Refills: 0   apixaban  5 mg tablet; Dose: 5 mg; Take 1 tablet (5 mg total) by mouth   two (2) times daily.; Refills: 0; Commonly known as: Eliquis    ascorbic acid  500 mg tablet; Dose: 500 mg; Take 1 tablet (500 mg total)   by mouth daily.; Refills: 0   atorvastatin  20 mg tablet; Dose: 20 mg; TAKE 1 TABLET BY MOUTH EVERYDAY   AT BEDTIME; Quantity: 90 tablet; Refills: 3; Commonly known as: Lipitor   CALCIUM  PO; Dose: 600 mg; Take 600 mg by mouth two (2) times daily.;   Refills: 0   HCA SUPER THERAVITE-M PO; Dose: 1 tablet; Take 1 tablet by mouth daily.;   Refills: 0   vitamin D (cholecalciferol ) 25 mcg (1000 units) tablet; Dose: 25 mcg;   Take 1 tablet (25 mcg total) by mouth daily.; Refills: 0     STOP taking these medications     amLODIPine  5 mg tablet; Commonly known as: Norvasc    carvedilol  6.25 mg tablet; Commonly known as: Coreg    dilTIAZem 120 mg 24 hr capsule; Commonly known as: Tiazac   losartan  25 mg tablet; Commonly known as: Cozaar       Requested Appointments   We will schedule and contact you with follow up appointment(s)   As   directed      Is the patient being discharged to SNF? No  Clinic/Test/Procedure to be scheduled: Sleep Medicine Clinic  Doctor Preferences (If applicable): first available  What studies should be completed  prior to follow up (if applicable):   Polysomnography to r/o OSA  Time Frame: within 1 month  Reason for Appointment/Procedure: observed episodes of O2 desaturations   during hospitalization w/ apneic episodes    Clinic/Test/Procedure to be scheduled: PCP  Doctor Preferences (If applicable): Jeana Francisco, MD  Time Frame: within 1 -2 weeks  Reason for Appointment/Procedure: s/p hospitalization w/ EGD found antral   ulcer on PPI, also observed episodes of O2 desaturations during   hospitalization w/ apneic episodes    Clinic/Test/Procedure to be scheduled:GI  Doctor Preference (If applicable): Dr. Mickiel or colleague  Time Frame:4 weeks  Reason for Appointment/Procedure:post-hospitalization follow-up for PUD,   on apixaban     Clinic/Test/Procedure to be scheduled:Neuro/stroke  Doctor Preference (If applicable):any  Time Frame:4-6 weeks  Reason for Appointment/Procedure:post-hospitalization follow-up for   history of CVA, now tachy brady on apixaban , holding ASA due to GIB        Scheduled Appointments  Future Appointments   Date Time Provider Department Center   09/24/2023  3:20 PM Odette Winton SQUIBB., NP CRD EP [address] [place]/Cen   10/09/2023  3:00 PM Vucicevic, Darko, MD CRD [facility] [address] [place]/Cen   10/16/2023  1:30 PM ARS, MP1 CRD DEVICE [place]/Cen   10/16/2023  2:00 PM Myrle Fines, MD CRD EP [address] [place]/Cen   10/24/2023  3:15 PM Jeana Francisco, MD IMRES [phone] [place]/Cen          Discharge Physical Exam/Labs/Imaging/Procedures     Discharge Physical Exam:   BP 103/60  ~ Pulse 61  ~ Temp 36.4 ?C (97.5 ?F) (Oral)  ~ Resp 19  ~ Wt 86.3 kg (190 lb 4.1 oz)  ~ SpO2 94%  ~ BMI 30.71 kg/m?   General appearance: alert, appears stated age, and cooperative  Neck: no JVD and supple, symmetrical, trachea midline  Lungs: clear to auscultation bilaterally  Heart:  RRR, paced at ~60bpm  Abdomen:  soft NTND  Extremities: no edema  Neurologic: Grossly normal     Relevant labs:     CBC:  Lab Results   Component Value Date    WBC 6.69 09/21/2023    HGB 11.5 (L) 09/21/2023    HCT 35.2 (L) 09/21/2023    PLT 178 09/21/2023     LFT:  Lab Results   Component Value Date    TOTPRO 7.1 09/19/2023    ALBUMIN 3.5 (L) 09/18/2023    BILITOT 0.5 09/18/2023    ALKPHOS 66 09/18/2023    AST 22 09/18/2023    ALT 38 09/18/2023       BMP:  Lab Results   Component Value Date    NA 141 09/21/2023    K 4.3 09/21/2023    CL 105 09/21/2023    CO2 26 09/21/2023    BUN 13 09/21/2023    CREAT 0.94 09/21/2023    CALCIUM  8.9 09/21/2023    GLUCOSE 100 (H) 09/21/2023       Cal/iCal/MG/Ph:  Lab Results   Component Value Date CALCIUM  8.9 09/21/2023    ICALCOR 1.10 09/19/2023    MG 1.7 09/21/2023     Coags:  Lab Results   Component Value Date    PT 12.9 09/18/2023    APTT 46.1 (H) 09/18/2023    INR 1.0 09/18/2023                   Relevant Imaging Studies (most recent results only):   NM Cardiac Amyloidosis Tc82m Pyrophosphate  Result Date: 09/20/2023  No scintigraphic evidence  of  ATTR cardiac amyloidosis. Thank you for referring this patient to the [facility]. I, Jodine Buggy, MD have reviewed this Nuclear Medicine study personally, and am in agreement with the report presented here. Dictated by: Eliott Room   09/20/2023 3:42 PM     XR chest pa+lat (2 views)  Result Date: 09/17/2023  IMPRESSION: 1. New left chest generator for dual-chamber pacer, stable postsurgical changes of AVR. 2. Stable small left pleural effusion. No new pneumothorax. Signed by: Ana Maliglig   09/17/2023 3:19 PM    Echo adult transthoracic w definity   Result Date: 09/16/2023  :  1. Technically very difficult study with suboptimal views.  2. Small LV size and severe concentric left ventricular hypertrophy.  3. Hyperdynamic LV systolic function. Normal left ventricular regional wall motion. Left ventricular ejection fraction is approximately >75%.  4. Mild LV diastolic dysfunction (grade I, impaired relaxation).  5. Normal right ventricular size. Normal RV systolic function.  6. A 25 mm Carpentier bioprosthetic valve is present in the aortic valve position. Echo findings are consistent with normal structure and function of the aortic prosthesis.  7. A prior echo performed on 08/08/2023 was reviewed for comparison. No significant changes noted since the previous study. FINDINGS: LEFT VENTRICLE: Left ventricular endocardium was not well-visualized. Recommend echo contrast for future studies. Small left ventricular size. Severe concentric left ventricular hypertrophy. Hyperdynamic systolic function. Visually estimated left ventricular ejection fraction >75%. Patient demonstrated normal sinus rhythm during echocardiogram. Mild LV diastolic dysfunction (grade I, impaired relaxation). No thrombus was visualized within the left ventricle. LV Wall Motion: All segments are normal. RIGHT VENTRICLE: Normal right ventricular size. Normal RV systolic function. LEFT ATRIUM: Normal left atrial size. RIGHT ATRIUM: Normal right atrial size. Right atrial pressure is estimated at 3 mmHg. MITRAL VALVE: Thickened mitral valve leaflets. No mitral annular calcification. No evidence of mitral valve regurgitation. TRICUSPID VALVE: The tricuspid regurgitation jet was not well seen. AORTIC VALVE: A 25 mm Carpentier bioprosthetic valve is present in the aortic valve position. Echo findings are consistent with normal structure and function of the aortic prosthesis. Normal prosthetic valve function. The aortic valve area is 1.20 cm (Indexed AVA is 0.61 cm/m). The aortic valve maximum velocity is 2.4 m/s. The peak gradient across the aortic valve is 22 mmHg. The mean gradient is 11 mmHg. PULMONIC VALVE: The pulmonic valve was not well visualized. No indication of pulmonic valve regurgitation. AORTA: Normal aortic root (3.5 cm). Normal mid ascending aorta (3.5 cm). SYSTEMIC VEINS: The inferior vena cava is normal in size and exhibits greater than 50% respiratory change. PERICARDIUM: There is no evidence of pericardial effusion.  2D AND M-MODE MEASUREMENTS: Left Ventricle:         Aorta/Left Atrium: IVSd (2D):      2.0 cm  Aortic Root, d (2D): 3.5 cm LVPWd (2D):     2.1 cm  Right Ventricle: LVIDd (2D):     2.4 cm  RV basal minor, A4C: 3.7 cm LV RWT:         1.78    RV Mid-cavity:       2.8 cm LV mass:        221     RV major, A4C:       6.6 cm LV SYSTOLIC FUNCTION BY 2D PLANIMETRY (MOD): WMSI: 1.0  LV DIASTOLIC FUNCTION: MV Peak E: 49 cm/s  e' lateral: 6.2 cm/s E/e' lateral: 7.9 MV Peak A: 123 cm/s Decel Time: 172 msec E/A Ratio: 0.4 RA Volumes: RA  VOL A4C:        26 ml    RA Vol A4C index: 13 ml/m RA area: RA area s, MOD A4C 11.9 cm RA vol s, MOD A4C 26.5 ml LA Volumes: LA Vol A4C:       24 ml    LA Vol A4C index: 12 ml/m LA area: LA area s MOD A4C 12.6 cm LA vol s, MOD A4C 24 ml Aortic Valve: AoV Max Vel: 2.4 m/s AoV Peak PG: 22 mmHg AoV Mean PG: 11 mmHg LVOT Vmax: 0.81 m/s LVOT VTI: 12.0 cm  AoV VTI:       31.4 cm LVOT SV:   38 ml    LVOT SI:  19 ml/m LVOT Diameter: 2.0 cm AoV Area, Vmax: 1.08 cm AoV Area, VTI:  1.20 cm                          AoV Area index: 0.61 cm/m Dimensionless index: 0.38 Tricuspid Valve and PA/RV Systolic Pressure: RA Pressure: 3 mmHg Aorta: Ao Asc: 3.5 cm Systemic Veins: IVC Diameter: 1.95 cm  [id number] Rachele Haste MD Electronically signed by [id number] Rachele Haste MD on 09/16/2023 at 1:38:41 PM cc: [phone] [facility]   Final       Procedures & Operations Performed:   09/14/23 - PPM placement         Nutrition Recommendations & Malnutrition Assessment   I have seen and examined the patient and agree with the RD assessment and plan. See RD note from this hospital admission for additional details.               Discharge Diet Orders:  2gm Sodium or low salt   As directed      2000mg  or less of sodium per day. Do not use salt. Avoid foods with added   salt such as highly processed foods, packaged foods, salty foods, and   foods from restaurants. To season your food, use herbs, spices, vinegar,   lemon, lime, or other salt-free herb/spice blends.      Skin/Wound         Discharge Activity Orders   There are no active discharge activity orders for this encounter.   Rehab Assessment   I have seen and examined the patient and agree with the PT assessment detailed below:  Inpatient Recommendation: PT treatment (09/16/23 1548)  Recommendation  Discharge Recommendation: Physical Therapy;Would benefit from continued therapy (09/21/23 1323)  Discharge concerns: Requires assistance for mobility;Requires assistance for self care (09/21/23 1323)  Discharge Equipment Recommended: Owns recommended DME (09/21/23 1323)  Present During Evaluation: Other reduced mobility (Z74.09), Limitation of activities due to disability (Z73.6)      I have seen and examined the patient and agree with the PT assessment detailed below:  Inpatient Recommendation: OT treatment (09/16/23 1414)  Recommendation  Discharge Recommendation: Occupational Therapy;Would benefit from continued therapy (09/20/23 1540)  Discharge concerns: Requires assistance for mobility;Requires assistance for self care (09/20/23 1540)  Discharge Equipment Recommended: Commode (09/20/23 1540)           Case Manager/Home Health Assessment   Discharge Information  Discharge Address: [address] [place] [address] [phone]/21/25 1327)  Contact Name: Advik, [place]oon (09/21/23 1327)  Relationship: Spouse (09/21/23 1327)  Contact Number(s): [phone] (09/21/23 1327)  Is patient/family informed of discharge?: Yes (09/21/23 1327)  Is patient/family agreeable of discharge destination?: Yes (09/21/23 1327)  Support Systems: Spouse (09/21/23 1327)  Medicare Important Message  Provided: Not Applicable (09/21/23 1327)  Medicare Outpatient Observation Notice (MOON) Provided: Not Applicable (09/21/23 1327)  Final Discharge Needs: Home Health Coordination;Home Durable Medical and/or Respiratory Therapy Equipment (09/21/23 1327)  Home Health Coordination  Agency Name: [facility]  [address]  [place], [place] [address] [phone]/21/25 1327)  Contact Person: Delayne Mccoy (09/21/23 1327)  Phone Number(s): [phone] (09/21/23 1327)  Fax Number: [phone] (09/21/23 1327)  Start of Care Date: 09/23/23 (09/21/23 1327)  Services: Nursing;Physical Therapy;Occupational Therapy (09/21/23 1327)     Home Durable Medical and/or Respiratory Therapy Equipment  Equipment Provided: Commode (09/21/23 1327)  Delivery Arrangements: Material Management to deliver to bedside 09/21/2023 (09/21/23 1327)     Home Health Orders   Home Health Occupational Therapy   As directed      Occupational therapy works with the patient/family/caregiver in regards   to how to perform activities of daily living (ADLs) as independently as   possible, in addition to providing recommendations for adaptive equipment,   exercises to improve/maintain upper extremity ROM/strength, etc.    Home Health Physical Therapy   As directed      Home Health Physician Attestation for Home Health   As directed      Patient Name: Drake Wuertz    Patient MRN: [id number]      Attending Provider: Srivastava, Pratyaksh K.*        ATTESTATION OF FACE-TO-FACE PHYSICIAN-PATIENT ENCOUNTER    (Face to Face encounter must occur 90 days prior to, or 30 days after the   Start of Care date)    Physician ordering/certifying Home Health services: Elsie HERO. Tina, MD    Date of Face-to-Face Encounter: 09/16/2023      I certify that this patient is under my care and that a face-to-face   encounter visit was conducted with the patient as noted above.      Attending Physician [NPI]: Srivastava, Pratyaksh K., MD [[id number]]          Ordering Provider:William M. Tina, MD         Date: 09/16/2023        Time: 4:08 PM          (For non-physician providers, please send document for co-signature by the   attending provider)    Home Health Skilled Nursing   As directed        Goals of Care Note (if new for this encounter)  No notes of this type exist for this encounter.        I have seen and examined the patient on their discharge day. I have reviewed, edited, and agree with the above discharge summary. More than 30 minutes was personally spent on discharge planning on the day of discharge in coordination of care, counseling the patient and preparation of discharge.  Dorn PHEBE Sprung, MD  09/21/2023 2:10 PM

## 2023-09-21 NOTE — Consults
 FINAL DISCHARGE MULTIDISCIPLINARY NOTE  Department of Care Coordination      Admit Ijuz:[id number]  Anticipated Date of Discharge: 09/21/2023    Following FI:Hjmrpj, Dorn FALCON., MD    Home Address:[address]  [place] [address]      DISCHARGE INFORMATION:     Discharge Address: [address], [place] [address]    Individual(s) notified of discharge plan:  Contact Name: Wissam, Resor Relationship: Spouse   Contact Number(s): [phone]      Is patient/family informed of discharge?: Yes Is patient/family agreeable of discharge destination?: Yes     Support Systems: Spouse       Medicare Important Message Provided: Not Applicable  Medicare Outpatient Observation Notice (MOON) Provided: Not Applicable      Final Discharge Needs: Home Health Coordination, Home Durable Medical and/or Respiratory Therapy Equipment         Home Health Coordination (if applicable):   Agency Name: [facility]  [address]  [place], [place] [address]  Contact Person: Games developer  Phone Number(s): [phone]  Fax Number: [phone]  Start of Care Date: 09/23/23  Services: Nursing, Physical Therapy, Occupational Therapy    Home Durable Medical and/or Respiratory Equipment (if applicable):   Equipment Provided: Commode  Delivery Arrangements: Material Management to deliver to bedside 09/21/2023    Homeless Discharge (if applicable):   Primary Living Situation: Lives w/Capable Spouse    Transportation Arrangements (if applicable):    Personal car-spouse [place])    Neila Molt, MSN, RN, PHN, CCM.  Case Manager  [address]  [place], [place] [address]  Phone: [phone]  Pager: [phone]  Email: [email or web address]

## 2023-09-21 NOTE — Progress Notes
 09/21/23 0949   Time Calculation   Encounter? Yes   Start Time [phone]  (PT tx attempt)   Last PT Treatment Date 09/18/23  (Attempt 6/21)   Patient not seen due to Team/RN requesting to hold treatment;Other (Comment)  (wife not present at bedside)     RN requesting PT to return later 2/2 pt needs morning meds.  Also, pt's wife is not present at bedside for family training.  Per pt, wife is downstairs.  Will follow up later as able.

## 2023-09-21 NOTE — Other
 [name] Clinical Goal:   Clinical Goal(s) for the Shift: monitor cardiac rate and rhythm, monitor lytes and replete prn, monitor I/O, maintain safety and comfort  Identify possible barriers to advancing the care plan: none  Stability of the patient: Moderately Unstable - medium risk of patient condition declining or worsening    Progression of [name] Clinical Goal: Patient is AAOx4, denies CP, dizziness, palpitations. A paced w/ BBB rhythm on tele monitor w/ rate 60s-70s BPM. SBP ranges in 110s. O2 sats 96 on RA, pt uses 1L NC at night time for comfort. Denies any pain. Daily net -246cc. Safety and comfort maintained.       Plan of care:  -continue monitoring for blood in stool     -d/c home today w/ [facility]

## 2023-09-21 NOTE — Progress Notes
 Physical Therapy Treatment      PATIENT: Andrew [name]  MRN: [id number]    Treatment Date: 09/21/2023    Patient Presentation: Position: In bed;Family/CG present (wife at bedside)  Lines/devices Drains: IV/PICC;Cardiac Monitor;Pulse Ox      Precautions   Precautions: Fall risk;Monitor Vitals;Check Labs  Orthotic: Left;AFO;Right;CAM/walking Boot  Current Activity Order: Activity as tolerated  Weight Bearing Status: Not Applicable  Additional Weight Bearing Status: Not Applicable    Cognition   Cognition: At Baseline Cognitive Status  Safety Awareness: Fair awareness of safety precautions;Impulsive  Barriers to Learning: Readiness to Learn    Bed Mobility   Rolling: Minimum Assist;to Left;Verbal Cueing;HOB flat  Supine to Sit: Moderate Assist;to Left;Verbal Cueing;HOB flat (assist at trunk)  Sit to Supine: Not Performed (pt in chair after PT)    Functional Mobility   Sit to Stand: Minimum Assist;Assistive Device (Comment) (from EOB, from toilet)  Ambulation: Minimum Assist;Verbal Cueing  Ambulation Distance (Feet): 10' x2 (focused on family training, increased time spent in bathroom for BM on toilet)  Gait Pattern: Decreased stride length;Decreased pace;Impaired Sequencing;Decreased weight shift;Wide Base of Support;Unsteady;Step to Gait;Decreased heel-toe  Assistive Device: Front wheeled walker     Balance   Sitting - Static: Fair;with UE support;Posterior Lean (VCs for anterior weight shift; max VCs to avoid using L UE to scoot backwards)  Sitting - Dynamic: Fair;with UE support;Able to weight shift anteriorly  Standing - Static: Fair;with UE support  Standing - Dynamic: Poor;with UE support      AM-PAC   AM-PAC Basic Mobility Raw Score: 16  AM-PAC Basic Mobility t-Scale Score: 38.32  AM-PAC Basic Mobility CMS 0-100% Score: 47.12 %    Neurological (if indicated)   Neuro Deficits: No    Exercises         Total Knee (if indicated)   Total Knee: No    Pain Assessment   Patient complains of pain: No    Patient Status Activity Tolerance: Good  Oxygen Needs: Room Air  Response to Treatment: Tolerated treatment well;Vital signs stable;Fatigued;with activity;Resolved with rest;Nursing notified  Compliance with Precautions: Poor (max VCs for avoiding L UE use, especially pushing/pulling during sit to stand and posterior scooting in sitting)  Call light in reach: Yes  Presentation post treatment: Up in chair;On cardiac monitor;Lines/drains intact;Pulse Ox;Family/CG present  Comments: Educated pt's wife on gait belt use, how to guard pt, how to assist pt during sit to stand.  Pt's wife able to demonstrate back.  Reinforced pacemaker precautions with pt and wife.  Pt frequently using L UE to scoot backwards and pulling with L UE during sit to stand.  By end of session, pt able to verbalize back to PT and demonstrate improved adherence to pacemaker precautions.    Interdisciplinary Communication   Interdisciplinary Communication: Nurse    Treatment Plan   Continue PT Treatment Plan with Focus on: Transfer training;Gait training;Patient/family/caregiver education and training    PT Recommendations   Discharge Recommendation: Physical Therapy;Would benefit from continued therapy  Discharge concerns: Requires assistance for mobility;Requires assistance for self care  Discharge Equipment Recommended: Owns recommended DME    Treatment Completed by: Shelba Hugh, PT

## 2023-09-21 NOTE — Progress Notes
 Pharmaceutical Services - Meds to [facility] Discharge Medication Reconciliation and Counseling Note    Patient Name: Andrew [name]  Medical Record Number: [id number]  Admit date: 09/09/2023 11:08 PM    Age: 68 y.o.  Sex: male  Allergies: Patient has no known allergies.    Preferred Pharmacy:   [facility] [phone] - [place], [place] - [address]  [address]  [place] [address]         I reconciled the discharge medications and counseled the patient and spouse on all new prescriptions. Discharge prescriptions were delivered to bedside. The purpose, potential side effects, storage, missed doses and any special instructions related to each medication was discussed. Medications continued from home were also reviewed with the patient.    Discharge Medication List from AVS:     Changes To My Medications        PAUSE taking these medications        Dose Instructions Notes   aspirin  81 mg EC tablet  Wait to take this until your doctor or other care provider tells you to start again.   81 mg   Take 1 tablet (81 mg total) by mouth daily.             START taking these medications        Dose Instructions Notes   amiodarone  200 mg tablet  Start taking on: September 21, 2023    Take 2 tablets (400 mg total) by mouth two (2) times daily for 6 days, THEN 1 tablet (200 mg total) daily for 24 days.      metoprolol  tartrate 50 mg tablet  Commonly known as: Lopressor    50 mg   Take 1 tablet (50 mg total) by mouth two (2) times daily.             CHANGE how you take these medications        Dose Instructions Notes   pantoprazole  40 mg DR tablet  Commonly known as: Protonix   What changed:   medication strength  how much to take  when to take this   40 mg   Take 1 tablet (40 mg total) by mouth two (2) times daily.             CONTINUE taking these medications        Dose Instructions Notes   allopurinol  100 mg tablet  Commonly known as: Zyloprim    200 mg   TAKE 2 TABLETS BY MOUTH EVERY DAY      amoxicillin 500 mg capsule    Use as directed prior to dental appointment      apixaban  5 mg tablet  Commonly known as: Eliquis    5 mg   Take 1 tablet (5 mg total) by mouth two (2) times daily.      ascorbic acid  500 mg tablet   500 mg   Take 1 tablet (500 mg total) by mouth daily.      atorvastatin  20 mg tablet  Commonly known as: Lipitor   20 mg   TAKE 1 TABLET BY MOUTH EVERYDAY AT BEDTIME      CALCIUM  PO   600 mg   Take 600 mg by mouth two (2) times daily.      HCA SUPER THERAVITE-M PO   1 tablet   Take 1 tablet by mouth daily.      vitamin D (cholecalciferol ) 25 mcg (1000 units) tablet   25 mcg   Take 1  tablet (25 mcg total) by mouth daily.             STOP taking these medications        STOP taking these medications   amLODIPine  5 mg tablet  Commonly known as: Norvasc       carvedilol  6.25 mg tablet  Commonly known as: Coreg       dilTIAZem 120 mg 24 hr capsule  Commonly known as: Tiazac      losartan  25 mg tablet  Commonly known as: Cozaar                 Prescriptions        These medications were sent to [facility] [phone])  [address], [place] [address]      Hours: Mon-Fri 8AM-9PM, Sat 8AM-7PM, Sun/Holidays 8AM-5PM (Closed 1PM-2PM for lunch) Phone: [phone]   amiodarone  200 mg tablet  metoprolol  tartrate 50 mg tablet  pantoprazole  40 mg DR tablet     Spoke primarily to wife. Amiordarone taper, cont for the full month, will follow up with cardiologist to see about long term therapy. Metoprolol  replaces starting, hold other BP meds. May cause tiredness at first. Pantoprazole  to bid, take before meals. Cont other home meds as indicated. But stop dilt and bp meds until cardiologist follow up, also hold asa until follow up. Wife aware, will call with any questions.     Donnice Canales, PharmD, 09/21/2023, 2:05 PM

## 2023-09-22 ENCOUNTER — Ambulatory Visit: Payer: PRIVATE HEALTH INSURANCE

## 2023-09-22 ENCOUNTER — Inpatient Hospital Stay
Admit: 2023-09-22 | Discharge: 2023-09-29 | Disposition: A | Discharge status: Home Health | Payer: Commercial Managed Care - HMO | Source: Home / Self Care | Attending: Student in an Organized Health Care Education/Training Program

## 2023-09-22 DIAGNOSIS — N179 Acute kidney failure, unspecified: Secondary | ICD-10-CM

## 2023-09-22 DIAGNOSIS — I495 Sick sinus syndrome: Secondary | ICD-10-CM

## 2023-09-22 DIAGNOSIS — Z950 Presence of cardiac pacemaker: Secondary | ICD-10-CM

## 2023-09-22 DIAGNOSIS — K922 Gastrointestinal hemorrhage, unspecified: Secondary | ICD-10-CM

## 2023-09-22 DIAGNOSIS — D62 Acute posthemorrhagic anemia: Secondary | ICD-10-CM

## 2023-09-22 LAB — CBC: MEAN CORPUSCULAR VOLUME: 87.8 fL (ref 79.3–98.6)

## 2023-09-22 LAB — APTT: APTT: 30.5 s (ref 24.4–36.2)

## 2023-09-22 LAB — Prothrombin Time Panel: INR: 1.2 s (ref 11.5–5.0)

## 2023-09-22 LAB — Differential Automated: ABSOLUTE NEUT COUNT: 7.96 x10E3/uL — ABNORMAL HIGH (ref 1.80–6.90)

## 2023-09-22 MED ADMIN — PANTOPRAZOLE SODIUM 40 MG IV SOLR: 80 mg | INTRAVENOUS | Stop: 2023-09-22 | NDC 71288060010

## 2023-09-22 NOTE — Progress Notes
 Physical Therapy  Discharge Summary    PATIENT: Andrew [name]  MRN: [id number]  DOB: 16-Dec-1955      Date:  09/22/2023   Therapist: Chiquita Earnie Molt, PT     Reviewed Treatment Plan, Progress and Goals with: PTA    Patient has been seen for:  Transfer training;Bed mobility training;Gait training;Therapeutic exercise;Discharge planning;Patient and/or family education;Balance training;Caregiver education;Education on precautions    Objective     See Daily Progress Notes for functional levels     Patient showing progress in: Bed mobility training;Transfer training;Gait training;Education on precautions;Caregiver education    Assessment     Goals met: No    Reason Goal(s) Not Met: Patient discharged    Goals:  Short Term Goals to be achieved in: 7 days  Pt will perform supine to sit: with minimum assist, consistently  Pt will perform sit to stand: with contact guard assist, with FWW  Pt will ambulate: 31-50 feet, with FWW, with minimum assist  Pt will perform all functional mobility: while adhering to precautions    Continue present treatment plan: No  Pt discharged from hospital    Updated Discharge Recommendations:  Discharge Recommendation: Physical Therapy;Would benefit from continued therapy  Discharge concerns: Requires assistance for mobility;Requires assistance for self care  Discharge Equipment Recommended: Owns recommended DME    AM-PAC   AM-PAC Basic Mobility Raw Score: 16  AM-PAC Basic Mobility t-Scale Score: 38.32  AM-PAC Basic Mobility CMS 0-100% Score: 47.12 %    Chiquita Molt PT, DPT

## 2023-09-22 NOTE — ED Provider Notes
 [facility]  Emergency Department Service Report    Triage     Andrew [name], a 68 y.o. male, presents with Melena (Pt discharged last night after admission for pacemaker placement on 09/14/2023. His stay was complicated by melena, underwent an EGD on 09/17/2023 which showed an antral ulceration s/p hemoclips x2. Instructed to come to ED if blood in stool persisted. +melena x3 today with subsequent presyncopal symptoms. POCT Hgb 8.8, Previous Hgb (yesterday's labs 0600) 11.5)    Arrived on 09/22/2023 at 3:47 PM   Arrived by Wheelchair [4]    ED Triage Vitals   Temp Temp Source BP Heart Rate Resp SpO2 O2 Device Pain Score Weight   09/22/23 1552 09/22/23 1552 09/22/23 1552 09/22/23 1552 09/22/23 1552 09/22/23 1552 09/22/23 1645 09/22/23 1950 09/22/23 1606   36.9 ?C (98.4 ?F) Oral 103/59 62 18 97 % None (Room air) Zero 86.3 kg (190 lb 4.1 oz)       No Known Allergies     Initial Physician Contact       Initial Contact Completed?: Yes (09/22/23 1612)    History   HPI  Patient is a 68 year old male with past medical history of antral ulcer, CVA, aortic valve replacement, presenting today for melena.  Recent admission and discharge; had pacemaker placed; currently on Eliquis .  Found to have upper GI bleed and proceeded to have clips placed.  Told to present to emergency department if presenting with melena.  Denies any bleeding from any other orifices.  Has had 3 bouts of melena; worsening lightheadedness described as presyncope but no actual syncopal episodes.  No chest pain or shortness of breath.  Last dose of Eliquis  this morning.  Denies any abdominal pain.  No nausea/emesis.  No recent falls/injuries.  Tolerating p.o..  Denies any current daily smoking, alcohol, illicit drug use.  Language Assistance     Language Resource Used?: Patient declined               Past Medical History:   Diagnosis Date    Antral ulcer 09/2023    CVA (cerebral vascular accident) (HCC/RAF)     H/O aortic valve replacement     Kidney stone     Urinary frequency         Past Surgical History:   Procedure Laterality Date    AORTIC VALVE REPLACEMENT      CARDIAC PACEMAKER PLACEMENT  09/2023    ESOPHAGOGASTRODUODENOSCOPY  09/2023        Past Family History   family history includes Kidney failure in his brother; No Known Problems in his mother; Stroke in his brother; Uterine cancer in his sister; gastric ulcer in his father.     Past Social History   he reports that he quit smoking about 33 years ago. His smoking use included cigarettes. He started smoking about 48 years ago. He has a 15 pack-year smoking history. He has never used smokeless tobacco. He reports that he does not currently use alcohol. He reports that he does not currently use drugs. No history on file for sexual activity.       Physical Exam   Physical Exam  Constitutional:       General: He is not in acute distress.     Appearance: Normal appearance. He is not ill-appearing, toxic-appearing or diaphoretic.   HENT:      Head: Normocephalic and atraumatic.      Right Ear: External ear normal.      Left  Ear: External ear normal.      Nose: Nose normal. No congestion or rhinorrhea.      Mouth/Throat:      Mouth: Mucous membranes are moist.      Pharynx: Oropharynx is clear. No oropharyngeal exudate or posterior oropharyngeal erythema.   Eyes:      General: No scleral icterus.        Right eye: No discharge.         Left eye: No discharge.      Extraocular Movements: Extraocular movements intact.      Conjunctiva/sclera: Conjunctivae normal.      Pupils: Pupils are equal, round, and reactive to light.   Cardiovascular:      Rate and Rhythm: Normal rate.      Pulses: Normal pulses.      Heart sounds: Normal heart sounds.   Pulmonary:      Effort: Pulmonary effort is normal. No respiratory distress.      Breath sounds: Normal breath sounds. No stridor. No wheezing, rhonchi or rales.   Chest:      Chest wall: No tenderness.      Comments: Recent pacemaker incision site present in left upper chest, wounds clean dry and intact  Abdominal:      General: Abdomen is flat.      Palpations: Abdomen is soft.      Tenderness: There is no abdominal tenderness. There is no guarding or rebound.   Genitourinary:     Comments: Rectal exam with frank melena  Musculoskeletal:         General: No swelling or tenderness. Normal range of motion.      Cervical back: Normal range of motion and neck supple. No rigidity or tenderness.   Skin:     General: Skin is warm and dry.      Capillary Refill: Capillary refill takes less than 2 seconds.   Neurological:      General: No focal deficit present.      Mental Status: He is alert and oriented to person, place, and time.      Cranial Nerves: No cranial nerve deficit.      Motor: No weakness.   Psychiatric:         Mood and Affect: Mood normal.         Behavior: Behavior normal.           Medical Decision Making   Andrew [name] is a 68 y.o. male presenting today for GI bleed.  With stable vitals.  Physical exam notable for frank melena, abdomen is soft, nontender, nondistended.  Differential diagnosis includes but is not limited to upper GI bleed, hemorrhagic shock, diverticulitis, coagulopathy, and esophageal varices.  Esophageal varices as source of GI bleed less suspicious as no history of liver disease, no regular alcohol use.  Coagulopathy less suspicious as no bleeding from any other orifices.  Diverticulitis less suspicious as no hematochezia or abdominal pain.  Hemorrhagic shock less suspicious as with stable vitals.  Upper GI bleed likely given history and exam findings.  Patient requires admission.      Medical Decision Making         Problems  Clinical Impressions Complexity of problems addressed         GI bleed (Primary)  Anticoagulated High:  [x]  Acute/chronic illness/injury with threat to life to bodily function  [x]  Chronic illness with severe exacerbation, progression, or side effects of treatment    Moderate:  []  Undiagnosed new problem with uncertain  prognosis  []  Acute illness with systemic symptoms  []  Acute complicated injury    Data and Risk  Independent Historian []  Parent as child too young to provide hx []  Family/Caregiver due to AMS/dementia []  EMS due to medical acuity/trauma []  Family/EMS due to behavioral health concern and for collateral []    External Data Reviewed previous workup and mgmt of patient's Melena via  [x]  Previous [place] Notes/Labs/Imaging []  External Notes/Labs/Imaging  which were non-contributory unless documented otherwise in HPI and ED Course   Considered but decided against  []  CT Head/C-spine given [place] CT/NEXUS/PECARN criteria []  CTA Chest to r/o PE given PERC/Well's criteria []  CT AP to r/o appendicitis given PAS score or family discussion []  Hospitalization due to *  []    Discussed w/ ext HCP [x]  Consults, PCP/outpt specialists, nursing home. See ED Course for details.   SDOH Affecting Dx/Tx []  Insurance limiting specialist referral []  Housing instability limiting outpt mgmt   []  Financial insecurity limiting medication access []  Substance/ETOH use []    Care de-escalation []  Shared decision-making regarding de-escalation of care (e.x. DNR) or foregoing hospitalization-level of care due to patient wishes/goals of care   Interpretations See ED Course. [x]     If applicable, parenteral controlled substances, drug therapies requiring intensive monitoring for toxicity, and prescription drug management are documented in the the Medications section of this note. If applicable, major and minor procedures are documented separately in Procedure Notes.           ED Course/ Progress Notes / Reassessments     ED Course as of 09/23/23 1111   Sun Sep 22, 2023   1654 Hemoglobin(!): 8.3  Hemoglobin decreased from previous however not at levels requiring transfusion [KM]   1654 Hemoglobin(!): 8.3  Down 8.3 from 11.5 [AL]   1702 INR: 1.2  Non elevated, lower concern for coagulopathy  [KM]   1702 GI paged  [KM]   1708 Basic Metabolic Panel(!)  BUN:Cr ration > 20:1, likely in setting of upper GI bleed [KM]   1718 Per GI, recommending initiation of PPI gtt  [KM]   1734 Signed out to oncoming resident, pending signed out and admission to medicine [KM]   1754 Signout received - Patient pending [ ]  s/o + handoff to medicine    Patient is a 68 yo M recent upper GI bleed, s/p clipping, dc'ed yesterday here for melena + lightheaded. Hgb drop to 8. Has melena on exam. Starting on PPI gtt. No abd TTP, no hx of cirrhosis.     [SS]   1901 Admitted to medicine [SS]      ED Course User Index  [AL] Jama Jodie DEL., MD  [KM] Chandra Franky HERO., MD  [SS] Lurlene Lauraine PARAS., MD              Laboratory Results     Labs Reviewed   BASIC METABOLIC PANEL - Abnormal; Notable for the following components:       Result Value    Glucose 127 (*)     Urea Nitrogen 37 (*)     All other components within normal limits   PROTHROMBIN TIME PANEL - Abnormal; Notable for the following components:    Prothrombin Time 15.5 (*)     All other components within normal limits   CBC (PERFORMABLE) - Abnormal; Notable for the following components:    White Blood Cell Count 10.19 (*)     Red Blood Cell Count 2.79 (*)     Hemoglobin 8.3 (*)  Hematocrit 24.5 (*)     Absolute Nucleated RBC Count 0.02 (*)     All other components within normal limits   DIFFERENTIAL, AUTOMATED (PERFORMABLE) - Abnormal; Notable for the following components:    Absolute Neut Count 7.96 (*)     Absolute Immature Gran Count 0.08 (*)     All other components within normal limits   HS TROPONIN I + REFLEX IF >= 5 NG/L - Abnormal; Notable for the following components:    High Sensitivity Troponin I 10 (*)     All other components within normal limits   APTT - Normal   CBC & AUTO DIFFERENTIAL    Narrative:     The following orders were created for panel order CBC & Auto Differential.  Procedure                               Abnormality         Status                     --------- -----------         ------                     RAR[[id number]]                          Abnormal            Final result               Differential, Automated[[id number]]      Abnormal            Final result                 Please view results for these tests on the individual orders.   TYPE AND SCREEN       Imaging Results     No orders to display       Consults     Consult Orders Placed This Encounter       Ordered     Status .    09/22/23 1658  Consult/Autopage to Medicine - Gastroenterology (GI) [Adult]  Consult Page Now        Provider:  (Not yet assigned)    Acknowledged             Clinical Impressions           GI bleed (Primary)  Anticoagulated       Disposition and Follow-up     Disposition: Admit [3]    Future Appointments   Date Time Provider Department Center   09/24/2023  3:20 PM Odette Winton SQUIBB., NP CRD EP [address] [place]/Cen   10/09/2023  3:00 PM Di Cater, MD CRD [facility] [address] [place]/Cen   10/16/2023  1:30 PM ARS, MP1 CRD DEVICE [place]/Cen   10/16/2023  2:00 PM Myrle Fines, MD CRD EP [address] [place]/Cen   10/24/2023  3:15 PM Jeana Francisco, MD [facility] [phone] [place]/Cen       Follow up with:  No follow-up provider specified.    Return precautions are specified on After Visit Summary.    New Prescriptions    No medications on file       Medications Administered This Encounter           Status .     melatonin tab 3 mg  Every night at bedtime  PRN         Acknowledged      lidocaine  4% cream  Once as needed         Acknowledged      pantoprazole  80 mg in sodium chloride  100 mL drip RTU  Continuous         Last MAR action: Rate/Dose Verify      pantoprazole  inj 80 mg  STAT         Last MAR action: Given              amiodarone  tab 400 mg is a High-risk medication as continuous ECG monitoring and frequent blood pressure checks are required.             Resident Signature   Franky Code, MD   PGY-3 Emergency Medicine          Code Franky HERO., MD  Resident  09/22/23 [phone]    ED Attending Attestation    I have discussed the management with the resident, was present with the resident during patient care services, have reviewed the resident note and agree with the documented findings and plan of care.    Jodie HILARIO Ruth, MD  09/23/2023 11:11 AM       Ruth Jodie DEL., MD  09/23/23 [phone]

## 2023-09-22 NOTE — H&P
 [facility]PARTMENT OF MEDICINE   DIVISION OF HOSPITAL MEDICINE  INITIAL HISTORY AND PHYSICAL    PATIENT: Andrew [name]               MRN: [id number]   PCP: Jeana Francisco, MD  ADMISSION DATE: 09/22/2023  HOSPITAL DAY: 0  DATE OF SERVICE: 09/22/2023  CC: Melena (Pt discharged last night after admission for pacemaker placement on 09/14/2023. His stay was complicated by melena, underwent an EGD on 09/17/2023 which showed an antral ulceration s/p hemoclips x2. Instructed to come to ED if blood in stool persisted. +melena x3 today with subsequent presyncopal symptoms. POCT Hgb 8.8, Previous Hgb (yesterday's labs 0600) 11.5)    HPI   Andrew [name] is a 68 y.o. 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  Patient discharged from this hospital on 6/21 (admitted 6/9-21), found to have tachy-brady syndrome and received pacemaker on 6/14, hospital course c/b melena and found to have antral ulceration s/p 2 hemoclips on 6/17.  His Hg was stable around 11 (baseline 14-15) and he was discharged on 6/21 on apixaban .  He was doing well after discharge but this AM had multiple episodes of black tarry stool as well as lightheadedness, prompting this return ED visit.  Denies chest pain or pressure, SOB, edema, palpitations, nausea, vomiting, urinary symptoms.    ER course:   Vitals stable.  Labs notable for Hg 8, down 3 from discharge.  Cr elevated, BUN elevated.  GI consulted.  PPI drip started.  CTA abd/pelvis performed, not read yet.  Admitted to medicine for further evaluation and treatment.    Medications   Objective (click to expand/collapse)   Scheduled:  amiodarone , 400 mg, Oral, BID  atorvastatin , 20 mg, Oral, Nightly  pantoprazole , 40 mg, IV Push, Q12H    Infusions:   pantoprazole  8 mg/hr (09/22/23 1852) PRN:  lidocaine , melatonin oral/enteral/sublingual       Physical Exam     Vital Signs:   Temp:  [36.9 ?C (98.4 ?F)] 36.9 ?C (98.4 ?F)  Heart Rate:  [60-62] 60  Resp:  [18-24] 18  BP: (101-107)/(59-69) 101/59  NBP Mean:  [74] 74  SpO2:  [96 %-98 %] 98 %       I&O's:   No intake/output data recorded. Oxygen Support:  Oxygen Therapy  SpO2: 98 %  O2 Device: None (Room air)           Weight for the past 720 hrs (Last 4 readings):   Weight   09/22/23 1606 86.3 kg (190 lb 4.1 oz)       System Normal Findings Abnormal Findings   General [x]  Normal general appearance   []  In acute distress  []  Chronically ill appearing  []  Sedated           []  Non Verbal  []  Other:   HENMT/Neck [x]  Oropharynx clear and moist  []  Normal JVP []  Dry mucous membranes  []  JVP ( cm)  []  Other:   Resp []  CTA B/L []  Intubated                 []  On Supp O2 ()  []  Wheezing ()  []  Rales ()   []  Crackles ()  []  Other:   CV [x]  Normal rhythm/rate  [x]  No murmurs  [x]  No lower extremity edema  []   Normal PMI   Normal pulses:   []  Radial []  Femoral  []  Pedal []   Abnormal rhythm ()  []   Murmur ()  []  S3    []  S4  Abnormal Pulses:  []  Radial ()  []  Femoral ()   []  DP ()  []  PT ()  []  Lower extremity edema       []  R ()   []  L ()  []  Bilateral ()  []  Other:   GI [x]  Soft  [x]  No tenderness to palpation  [x]  No rebound/guarding []  Tenderness ()  []  Fluid Wave  []  Hepatomegaly  Other:   MSK [x]  Normal strength/tone []  Other:    Skin []  No rash []  Other:   Neuro [x]  AO x 3  []  Normal strength  []  Normal sensation  []  Normal reflexes  []  Normal cerebellar exam []  AO x 0       []  AO x 1     []  AO x 2  Abnormal neuro exam:  []  Abnormal strength  []  Abnormal sensation  []  Abnormal reflexes  []  Abnormal cerebellar exam       Labs/Studies     Labs  Objective (click to expand/collapse)   Last CBC:   Results for orders placed or performed during the hospital encounter of 09/22/23   CBC   Result Value Ref Range    White Blood Cell Count 10.19 (H) 4.16 - 9.95 x10E3/uL    Red Blood Cell Count 2.79 (L) 4.41 - 5.95 x10E6/uL    Hemoglobin 8.3 (L) 13.5 - 17.1 g/dL    Hematocrit 75.4 (L) 38.5 - 52.0 %    Mean Corpuscular Volume 87.8 79.3 - 98.6 fL    Mean Corpuscular Hemoglobin 29.7 26.4 - 33.4 pg    MCH Concentration 33.9 31.5 - 35.5 g/dL    Red Cell Distribution Width-SD 44.8 36.9 - 48.3 fL    Red Cell Distribution Width-CV 14.1 11.1 - 15.5 %    Platelet Count, Auto 177 143 - 398 x10E3/uL    Mean Platelet Volume 11.3 9.3 - 13.0 fL    Nucleated RBC%, automated 0.2 No Ref. Range %    Absolute Nucleated RBC Count 0.02 (H) 0.00 - 0.00 x10E3/uL    Neutrophil Abs (Prelim) 7.96 See Absolute Neut Ct. x10E3/uL   Differential, Automated   Result Value Ref Range    Neutrophil Percent, Auto 78.1 No Ref. Range %    Lymphocyte Percent, Auto 15.5 No Ref. Range %    Monocyte Percent, Auto 5.0 No Ref. Range %    Eosinophil Percent, Auto 0.3 No Ref. Range %    Basophil Percent, Auto 0.3 No Ref. Range %    Immature Granulocytes% 0.8 No Reference Range %    Absolute Neut Count 7.96 (H) 1.80 - 6.90 x10E3/uL    Absolute Lymphocyte Count 1.58 1.30 - 3.40 x10E3/uL    Absolute Mono Count 0.51 0.20 - 0.80 x10E3/uL    Absolute Eos Count 0.03 0.00 - 0.50 x10E3/uL    Absolute Baso Count 0.03 0.00 - 0.10 x10E3/uL    Absolute Immature Gran Count 0.08 (H) 0.00 - 0.04 x10E3/uL   CBC & Auto Differential    Narrative    The following orders were created for panel order CBC & Auto Differential.  Procedure                               Abnormality  Status                     ---------                               -----------         ------                     RAR[[id number]]                          Abnormal            Final result               Differential, Automated[[id number]]      Abnormal            Final result                 Please view results for these tests on the individual orders.      Results for orders placed or performed during the hospital encounter of 09/09/23   CBC   Result Value Ref Range    White Blood Cell Count 6.69 4.16 - 9.95 x10E3/uL    Red Blood Cell Count 4.03 (L) 4.41 - 5.95 x10E6/uL    Hemoglobin 11.5 (L) 13.5 - 17.1 g/dL    Hematocrit 64.7 (L) 38.5 - 52.0 %    Mean Corpuscular Volume 87.3 79.3 - 98.6 fL    Mean Corpuscular Hemoglobin 28.5 26.4 - 33.4 pg    MCH Concentration 32.7 31.5 - 35.5 g/dL    Red Cell Distribution Width-SD 44.8 36.9 - 48.3 fL    Red Cell Distribution Width-CV 14.2 11.1 - 15.5 %    Platelet Count, Auto 178 143 - 398 x10E3/uL    Mean Platelet Volume 11.1 9.3 - 13.0 fL    Nucleated RBC%, automated 0.0 No Ref. Range %    Absolute Nucleated RBC Count 0.00 0.00 - 0.00 x10E3/uL     Last BMP:   Results for orders placed or performed during the hospital encounter of 09/22/23   Basic Metabolic Panel   Result Value Ref Range    Sodium 138 135 - 146 mmol/L    Potassium 5.1 3.6 - 5.3 mmol/L    Chloride 104 96 - 106 mmol/L    Total CO2 24 20 - 30 mmol/L    Anion Gap 10 8 - 19 mmol/L    Glucose 127 (H) 65 - 99 mg/dL    Creatinine 8.76 9.39 - 1.30 mg/dL    Estimated GFR 64 See GFR Additional Information mL/min/1.55m2    GFR Additional Information See Comment     Urea Nitrogen 37 (H) 7 - 22 mg/dL    Calcium  8.9 8.6 - 10.4 mg/dL          Micro  Objective (click to expand/collapse)   Recent Results (from the past week)   H pylori Ag,Stool    Collection Time: 09/19/23  3:19 PM    Specimen: Stool   Result Value Ref Range    Helicobacter pylori Antigen Negative Negative          Radiology  Objective (click to expand/collapse)   No imaging has been resulted in the last 24 hours       Additional studies   Objective (click to expand/collapse)   None       Independent Review  of Studies  None    I reviewed these notes that directly relate to the patient's acute and/or chronic medical problems with documentation of the salient findings if any:  Inpatient notes:      Last Emergency Medicine Note           09/22/23 1737 ED Provider Notes signed by Chandra Franky HERO., MD          ,   Last Cardiology Note    No notes exist for this encounter.       ,   Last Gastroenterology Note    No notes exist for this encounter.       , and   Last Hospitalist Note    No notes exist for this encounter.       Outpatient notes:  None       A&P   Andrew [name] is a 68 y.o. male admitted for melena with suspicion of recurrent upper GI bleed due to PUD.  HDS.    1) acute upper GI bleed likely due to PUD, POA  2) acute blood loss anemia, POA   3) melena, POA  - GI consulted, appreciate recs  - trend Hg  - transfuse for Hg < 7  - PPI bolus -> continuous infusion  - 2 large bore Ivs  - f/u CTA abdomen/pelvis  - monitor vitals  - monitor for BM    4) tachy-brady syndrome s/p PPM   5) pAF  6) hx of bicuspid AV s/p bioprosthetic valve  - HOLD home apixaban   - continue home amiodarone   - continue home metoprolol   - continue home statin  - continue to hold aspirin   - will need cardiology consult for discussion of anticoagulation following this recurrent GI bleed    7) AKI, POA  - check urine studies  - trend BMP  - avoid nephrotoxins  - blood transfusion if signs of active bleeding or Hg < 7         # [name] Checklist  - Diet: Diet NPO Reason for NPO: Procedure; Except for: Ice Chips, Medications, Sips of water  - VTE PPX: SCDs  - LDA:   Peripheral IV 20 G Anterior;Proximal;Right Forearm (0)  Peripheral IV 20 G Posterior;Right Wrist (0)    Advance Care Planning:   Full Code,  Primary Emergency Contact: Andrew [name], Home Phone: [phone], Mobile Phone: [phone]    Disposition:  Expected post-hospitalization disposition will be to Home    76 minutes were spent personally by me today on this encounter which include today's pre-visit review of the chart, obtaining appropriate history, performing an evaluation, documentation and discussion of management with details supported within the note for today's visit. The time documented was exclusive of any time spent on the separately billed procedure.           Author:    Thedora CANDIE Mattock  09/22/2023 6:54 PM

## 2023-09-22 NOTE — Consults
 [facility] DEPARTMENT OF MEDICINE  DIVISION OF GASTROENTEROLOGY  GENERAL GI CONSULT NOTE    PATIENT: Andrew [name]  MRN: [id number]  DOB: [birth year]    ADMISSION DATE:     HOSPITAL DAY: 0  DATE OF SERVICE: 09/22/2023   ATTENDING ON SERVICE (CONSULT REQUESTOR): Jama Jodie DEL., MD  REASON FOR CONSULT: Melena    HPI/SUBJECTIVE   Aloysuis Ribaudo is a 68 y.o. male 68 y.o. w/ h/o PUD, bicuspid AV sp TAVR 2013 (bioprosthetic) cb CVA w residual LLE weakess, presented to OSH w pre-syncope transferred for reported tachy-brady now s/p  PPM placement. GI consulted for recurrent melena.     Patient discharged yesterday. Reports that was still having darker stools but not frankly melanic and just once per day at time of discharge. Discharged on apixaban  for Afib w/ tachy-brady syndrome s/p PPM. Was taking PPI though only took one PM and one AM dose. Last dose Apix 6/22 AM. Reports that this AM got out of bed, went to eat breakfast, soon after eating felt as though he was going to pass out, though he did not loose consciousness. Soon thereafter had a large, melanic bowel movement. Had two more before coming to the ED and a fourth while in the ED. Feels most recent one is smaller than those prior. Denies chest pain or worsening shortness of breath but continues to feel weak and presyncopal.      MEDS   Scheduled:      PRN:      Infusions:   pantoprazole             PHYSICAL EXAM     Temp:  [36.9 ?C (98.4 ?F)] 36.9 ?C (98.4 ?F)  Heart Rate:  [60-62] 60  Resp:  [18-24] 18  BP: (101-107)/(59-69) 101/59  NBP Mean:  [74] 74  SpO2:  [96 %-98 %] 98 %  BMI Readings from Last 1 Encounters:   09/22/23 30.71 kg/m?       System Check if examined and normal Positive or additional negative findings   Constit  [x]  General appearance - normal     Eyes  []  Conjuctivae clear       HENMT  []  Normal Lips/teeth/gums, oropharynx, head     Neck  []  Inspection; no masses palpated     Resp  []  Effort good. No labored breathing     CV  []  Normal rhythm/rate   []  Normal pulses     Abdomen  [x]  Soft, non-tender, non-distended, no rebound guarding    []  Active bowel sounds 4 quadrants   []  No appreciable flank or shifting dullness   []  No hepatosplenomegaly   []  No umbilical hernia    Rectal   []  No external hemorrhoids or fissure   []  No masses palpated on DRE   []  Appropriate resting pressure/sphincter tone   []  Appropriate squeeze pressure   []  Appropriate bear down maneuver   []  Chaperone present for exam  Large melanic BM at bedside, melena on ED rectal exam   MSK  []  Grossly normal strength, ROM     Neuro   [x]  Grossly normal      Psychiatric  []   Oriented to time, place and person   []   Appropriate insight/judgement & mood/affect             LABS/STUDIES     Labs Independently Interpreted by me today  Recent Labs     09/22/23  1637 09/21/23  0609 09/20/23  0620   WBC 10.19*  6.69 7.27   NEUTPCT 78.1  --   --    HGB 8.3* 11.5* 11.6*   HCT 24.5* 35.2* 35.1*   PLT 177 178 150   MCV 87.8 87.3 86.2     Recent Labs     09/22/23  1637 09/21/23  0609   NA 138 141   K 5.1 4.3   CL 104 105   CO2 24 26   ANIONGAP 10 10   BUN 37* 13   CREAT 1.23 0.94     Recent Labs     09/22/23  1637 09/21/23  0609 09/20/23  0620   MG  --  1.7 1.7   CALCIUM  8.9 8.9 8.8      No results for input(s): ''TOTPRO'', ''ALBUMIN'', ''BILITOT'', ''BILICON'', ''ALT'', ''AST'', ''ALKPHOS'' in the last 72 hours.  Lab Results   Component Value Date    PT 15.5 (H) 09/22/2023    INR 1.2 09/22/2023    APTT 30.5 09/22/2023     No results found for: ''FE'', ''TIBC'', ''TRNSFERR'', ''FEBINDSAT'', ''FERRITIN''  No results found for: ''VITAMINB12'', ''FOLATE''    Hemoglobin Trend:   Lab Results   Component Value Date/Time    HGB 8.3 (L) 09/22/2023 04:37 PM    HGB 11.5 (L) 09/21/2023 06:09 AM    HGB 11.6 (L) 09/20/2023 06:20 AM    HGB 11.7 (L) 09/19/2023 01:59 PM         Imaging Tests Independently Interpreted by me Today   Last CT a/p: No results found for this or any previous visit.       Procedures  Last EGD:   Results for orders placed or performed during the hospital encounter of 09/09/23   Upper gastrointestinal endoscopy    Transcription    PATIENT NAME: JONA, ZAPPONE  DATE OF BIRTH: [birth year]  RECORD NUMBER: [id number]  DATE/TIME OF PROCEDURE: 09/17/2023 / 12:06 PM  ENDOSCOPIST: Oliva Abts, MD  REFERRING PHYSICIAN:  FELLOW: Justus Fairly    INDICATIONS FOR EXAMINATION: 68 y.o. male 68 y.o. 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 GI consulted for melena, here for   EGD (apixaban  held for 30 hours prior to procedure)  PROCEDURE PERFORMED: UPPER GI ENDOSCOPY - diagnostic    MEDICATIONS: MAC anesthesia    PROCEDURE TECHNIQUE:  Informed consent obtained for the procedure including risks, benefits and alternatives and the risk of those alternatives. Informed consent obtained for sedation including risks, benefits and alternatives and the risk of those alternatives.    EKG, pulse, pulse oximetry and blood pressure were monitored throughout the procedure.    The endoscope was passed with ease through the mouth under direct visualization; it was extended to the 2nd portion of the duodenum. The scope was withdrawn and the mucosa was carefully examined. The views were excellent. The patient's toleration of the   procedure was excellent.    Total Withdrawl Time:  Total Insertion Time:    EXTENT OF EXAM: 2nd part of the duodenum      INSTRUMENTS: [id number] #[id number]  TECHNICAL DIFFICULTY: No  LIMITATIONS: none TOLERANCE: Good  VISUALIZATION: Good        FINDINGS:  Esophagus: The esophagus appeared to be normal without evidence of esophagitis, rings, furrows, strictures. There was no hiatal hernia.  Stomach: There was diffuse ulceration in the antrum, lesser curve. Most areas were 1cm clean based, there was 1 area with a  flat pigmented spot vs raised vessel. Hemostasis was performed with epinephrine 1:20,000 by 0.5cc x4. 2 hemoclips were placed over   the site. the surrounding mucosa had slight nodular erythema diffusely. On retroflexion no additional findings were noted.  Duodenum: The duodenum had erythema of the duodenal bulb, otherwise normal in the 2nd portion.    ESTIMATED BLOOD LOSS: None    DIAGNOSIS:  -Gastric ulcer, one area with a visible vessel s/p hemostasis, likely cause of recent bleeding    RECOMMENDATIONS:  -Ok to start clear liquid diet and advance as tolerated  -continue high dose PPI (IV gtt or 40mg  IV bid) x3 days  -Avoid excessive NSAID use  -check H pylori stool Ag and treat if positive  -ok to restart anticoagulation  -recommend surveillance EGD in 2 months to confirm healing (biopsies not taken today given on apixaban  held less than 48 hrs)            This electronic signature authenticates all electronic and/or handwritten documentation, including orders, generated by the signer during the episode of care contained in this record.09/18/2023 12:52 AM by Oliva Abts, MD          I reviewed these specialist notes that directly relate to the patient's acute and/or chronic medical problems with documentation of the salient findings, if any: Gastroenterology    I have spoken with the following physicians/teams regarding the patient's care today:  Primary team    A&P   Floyd Wade is a 68 y.o. male 68 y.o. w/ h/o PUD, bicuspid AV sp TAVR 2013 (bioprosthetic) cb CVA w residual LLE weakess, presented to OSH w pre-syncope transferred for reported tachy-brady now s/p  PPM placement. GI consulted for recurrent melena.     #PUD s/p recent epi injection + hemoclip  #Melena  Known PUD with recent endoscopy and treatment. On DOAC but ASA was held given concern for recurrent bleeding. Now with multiple episodes of melena and symptomatic acute on chronic anemia. Remains hemodynamically stable though is currently paced and has tachy-brady syndrome.    ASSESSMENT & PLAN  - Maintain large-bore peripheral IV access, active T+S; transfuse for Hgb < 7, platelets < 50k, INR < 2.5  - Hold Eliquis   - IV Pantoprazole  bolus + gtt  - Keep NPO except for medications  - Please have nursing upload photos of BM        Code Status:  Full Code      Author:    Justus SAILOR. Maree MD 09/22/2023 6:10 PM  GI Fellow, pager [phone]  If after 5pm or on weekends, please page GI fellow on call    Discussed with attending gastroenterologist on service, Dr. Abts    I saw and examined the patient. I discussed the case with the fellow/resident and agree with the findings and plan as documented in the note above.    The above plan of care, diagnosis, orders, and follow-up were discussed with the patient.  Questions related to this recommended plan of care were answered.    Signed: Oliva KYM Abts 09/24/2023 1:25 AM

## 2023-09-23 ENCOUNTER — Ambulatory Visit: Payer: Commercial Managed Care - HMO

## 2023-09-23 DIAGNOSIS — Z7901 Long term (current) use of anticoagulants: Secondary | ICD-10-CM

## 2023-09-23 DIAGNOSIS — D649 Anemia, unspecified: Secondary | ICD-10-CM

## 2023-09-23 LAB — Basic Metabolic Panel
ANION GAP: 10 mmol/L (ref 8–19)
ANION GAP: 10 mmol/L (ref 8–19)
SODIUM: 143 mmol/L (ref 135–146)
TOTAL CO2: 24 mmol/L (ref 20–30)

## 2023-09-23 LAB — Magnesium: MAGNESIUM: 1.7 meq/L (ref 1.4–1.9)

## 2023-09-23 LAB — Differential Automated: ABSOLUTE LYMPHOCYTE COUNT: 2.32 x10E3/uL (ref 1.30–3.40)

## 2023-09-23 LAB — Phosphorus: PHOSPHORUS: 4.1 mg/dL (ref 2.5–4.5)

## 2023-09-23 LAB — Hepatic Funct Panel: ALKALINE PHOSPHATASE: 47 U/L (ref 37–133)

## 2023-09-23 LAB — HS Troponin I + Reflex If >=  5 ng/L: HIGH SENSITIVITY TROPONIN I: 10 ng/L — ABNORMAL HIGH (ref <5)

## 2023-09-23 LAB — CBC
HEMATOCRIT: 22.4 % — ABNORMAL LOW (ref 38.5–52.0)
MEAN CORPUSCULAR VOLUME: 88.1 fL (ref 79.3–98.6)
RED BLOOD CELL COUNT: 2.45 x10E6/uL — ABNORMAL LOW (ref 4.41–5.95)

## 2023-09-23 LAB — HS Troponin I (Reflexed): HIGH SENSITIVITY TROPONIN I: 9 ng/L — ABNORMAL HIGH (ref <5)

## 2023-09-23 MED ADMIN — ONDANSETRON HCL 4 MG/2ML IJ SOLN: 4 mg | INTRAVENOUS | @ 04:00:00 | Stop: 2023-09-29 | NDC 60505613000

## 2023-09-23 MED ADMIN — IOHEXOL 350 MG/ML IV SOLN: 150 mL | INTRAVENOUS | @ 03:00:00 | Stop: 2023-09-23 | NDC 00407141493

## 2023-09-23 MED ADMIN — PROPOFOL 200 MG/20ML IV EMUL: INTRAVENOUS | @ 23:00:00 | Stop: 2023-09-24 | NDC 63323026929

## 2023-09-23 MED ADMIN — PANTOPRAZOLE SODIUM-NACL 80-0.9 MG/100ML-% IV SOLN: 8 mg/h | INTRAVENOUS | @ 22:00:00 | Stop: 2023-09-24 | NDC 00338964801

## 2023-09-23 MED ADMIN — SUCCINYLCHOLINE CHLORIDE 20 MG/ML IJ SOLN: INTRAVENOUS | @ 23:00:00 | Stop: 2023-09-24 | NDC 00781341195

## 2023-09-23 MED ADMIN — LIDOCAINE HCL (PF) 1 % IJ SOLN: INTRAVENOUS | @ 23:00:00 | Stop: 2023-09-24 | NDC 00143959525

## 2023-09-23 MED ADMIN — SODIUM CHLORIDE 0.9 % IV SOLN: 75 mL/h | INTRAVENOUS | @ 04:00:00 | Stop: 2023-09-24 | NDC 00338004904

## 2023-09-23 MED ADMIN — PANTOPRAZOLE SODIUM-NACL 80-0.9 MG/100ML-% IV SOLN: 8 mg/h | INTRAVENOUS | @ 02:00:00 | Stop: 2023-09-24 | NDC 00338964801

## 2023-09-23 MED ADMIN — PANTOPRAZOLE SODIUM-NACL 80-0.9 MG/100ML-% IV SOLN: 8 mg/h | INTRAVENOUS | @ 10:00:00 | Stop: 2023-09-24 | NDC 00338964801

## 2023-09-23 MED ADMIN — METOPROLOL TARTRATE 50 MG PO TABS: 50 mg | ORAL | @ 06:00:00 | Stop: 2023-09-23

## 2023-09-23 MED ADMIN — SODIUM CHLORIDE 0.9 % IV SOLN: INTRAVENOUS | @ 23:00:00 | Stop: 2023-09-24 | NDC 00338004902

## 2023-09-23 MED ADMIN — PROPOFOL 200 MG/20ML IV EMUL: INTRAVENOUS | Stop: 2023-09-24 | NDC 63323026929

## 2023-09-23 MED ADMIN — PROPOFOL 200 MG/20ML IV EMUL (ANES): INTRAVENOUS | Stop: 2023-09-24

## 2023-09-23 MED ADMIN — ATORVASTATIN CALCIUM 20 MG PO TABS: 20 mg | ORAL | @ 05:00:00 | Stop: 2023-09-29 | NDC 68084009811

## 2023-09-23 MED ADMIN — METOPROLOL TARTRATE 50 MG PO TABS: 50 mg | ORAL | @ 05:00:00 | Stop: 2023-09-23 | NDC 51079080101

## 2023-09-23 MED ADMIN — ONDANSETRON HCL 4 MG/2ML IJ SOLN: 4 mg | INTRAVENOUS | Stop: 2023-09-29 | NDC 60505613005

## 2023-09-23 MED ADMIN — METOCLOPRAMIDE HCL 5 MG/ML IJ SOLN: 5 mg | INTRAVENOUS | @ 14:00:00 | Stop: 2023-09-23 | NDC 23155024031

## 2023-09-23 MED ADMIN — AMIODARONE HCL 200 MG PO TABS: 400 mg | ORAL | @ 05:00:00 | Stop: 2023-09-24 | NDC 60687043711

## 2023-09-23 MED ADMIN — EPHEDRINE SULFATE (PRESSORS) 50 MG/ML IV SOLN: INTRAVENOUS | Stop: 2023-09-24 | NDC 51754420004

## 2023-09-23 MED ADMIN — AMIODARONE HCL 200 MG PO TABS: 400 mg | ORAL | @ 16:00:00 | Stop: 2023-09-24 | NDC 60687043711

## 2023-09-23 NOTE — Procedures
 PATIENT NAME: [name], Andrew  DATE OF BIRTH: 1956/03/28  RECORD NUMBER: [id number]  DATE/TIME OF PROCEDURE: 09/23/2023 / 02:42 PM  ENDOSCOPIST: Oliva Abts, MD  REFERRING PHYSICIAN:  FELLOW: Justus Fairly    INDICATIONS FOR EXAMINATION: 68 y.o. male 68 y.o. w/ h/o bicuspid AV sp TAVR 2013 (bioprosthetic) cb CVA w residual LLE weakess, presented to OSH w pre-syncope transferred for reported tachy-brady now s/p PPM placement, s/p EGD 6/17 with epi/hemoclip x2   with recurrent bleeding here for EGD  PROCEDURE PERFORMED: UPPER GI ENDOSCOPY - diagnostic    MEDICATIONS: MAC anesthesia    PROCEDURE TECHNIQUE:  Informed consent obtained for the procedure including risks, benefits and alternatives and the risk of those alternatives. Informed consent obtained for sedation including risks, benefits and alternatives and the risk of those alternatives.    EKG, pulse, pulse oximetry and blood pressure were monitored throughout the procedure.    The endoscope was passed with ease through the mouth under direct visualization; it was extended to the 2nd portion of the duodenum. The scope was withdrawn and the mucosa was carefully examined. The views were excellent. The patient's toleration of the   procedure was excellent.    Total Withdrawl Time:  Total Insertion Time:    EXTENT OF EXAM: 2nd part of the duodenum      INSTRUMENTS: [id number] - [id number]  TECHNICAL DIFFICULTY: No  LIMITATIONS: none TOLERANCE: Good  VISUALIZATION: Good        FINDINGS:  Esophagus: The esophagus appeared to be normal without evidence of esophagitis, rings, furrows, strictures. There was no hiatal hernia.  Stomach: The stomach appeared to have no blood throughout. In the antrum there was a 5mm ulcer with a visible vessel present. epinephrine 1:10,000 was injected in 0.5cc increments in 4 quadrants for 2.5cc total. the Ovesco (12a) device was used to deploy   directly over the ulcer site for hemostasis. there were a few additional clean based ulcers in the antrum noted. The pylorus was patent. On retroflexion no additional findings were noted.  Duodenum: The duodenal bulb had mild erythema, otherwise appeared to be normal to the 2nd portion.    ESTIMATED BLOOD LOSS: None    DIAGNOSIS:  -Gastric ulcer with high risk stigmata (prior hemoclips fell off) s/p hemostasis with Ovesco clip  -Duodenopathy    RECOMMENDATIONS:  -continue PPI IV bid x72 hours, then po bid x2 months and daily therafter  -ok to start clear liquid diet  -hold anticoagulation for 3days, if no signs of bleeding on 6/26 ok to restart  -repeat EGD in 2-3 months to confirm healing of the area            This electronic signature authenticates all electronic and/or handwritten documentation, including orders, generated by the signer during the episode of care contained in this record.09/23/2023 05:08 PM by Oliva Abts, MD

## 2023-09-23 NOTE — Consults
 CASE MANAGER ASSESSMENT      Admit Ijuz:[id number]    Date of Initial CM Assessment: 09/23/2023    Problems: Active Problems:    * No active hospital problems. *       Past Medical History:   Diagnosis Date    Antral ulcer 09/2023    CVA (cerebral vascular accident) (HCC/RAF)     H/O aortic valve replacement     Kidney stone     Urinary frequency     Past Surgical History:   Procedure Laterality Date    AORTIC VALVE REPLACEMENT      CARDIAC PACEMAKER PLACEMENT  09/2023    ESOPHAGOGASTRODUODENOSCOPY  09/2023          Primary Care Physician:Talishinsky, Stasia, MD  [phone]    LANGUAGE ASSISTANCE:   Language Assistance  Language Resource Used?: Patient declined    NEEDS ASSESSMENT:   Is the patient able to participate in the CM Initial Assessment at this time?: (P) No - Patient Unavailable (CM to bedside to attempt to complete assessment. Patient asleep, does not arouse to voice.)   Dorothyann ''Katie Beth'' Terese, BSN, RN  RN Case Manager  Care Coordination [facility] Phone: [phone]

## 2023-09-23 NOTE — Consults
 IP CM ACTIVE DISCHARGE PLANNING  Department of Care Coordination      Admit Ijuz:[id number]  Anticipated Date of Discharge:     Following FI:Gjrxdnw, Evan G., MD      Today's short update     UGIB, EGD//colo today pending final recs from GI    Disposition     Home w/Home Health  Nursing  [address],  [place] [address]  Family/Support System in agreement with the current discharge plan: Yes, in agreement and participating    Multidisciplinary Team Member Plan of Care   Interdisciplinary rounds were conducted with the multidisciplinary team including the clinical social worker and nurse case manager. The patient's plan of care and discharge plan were discussed and formulated based on the patient's specific needs.    Home Health Coordination Status (if applicable)     Order pending (1/7), Barrier: Insurance, Resumption of care (Spouse requesting resumption of care for home health through [facility],  per [place], the PCP is not [employer] enrolled so they are requesting if Dr. Leon[facility] would be willing to sign the patient's plan of care if home health is necessary.)    Non-medical Transportation Arrangement Status (if applicable)     Patient/family secured          Sagamore ''Katie Beth'' Terese, BSN, RN  RN Case Manager  Care Coordination [facility] Phone: [phone]

## 2023-09-23 NOTE — H&P
 UPDATED H&P REQUIREMENT    WHAT IS THE STATUS OF THE [name] MOST CURRENT HISTORY AND PHYSICAL?   - The most current H&P was performed within the past 24 hours. No additional updated H&P documentation is necessary.     REFER TO MEDICAL STAFF POLICIES REGARDING PRE-PROCEDURE HISTORY AND PHYSICAL EXAMINATION AND UPDATED H&P REQUIREMENTS BELOW:    Karle Ovens [facility] Medical Staff Policy 200 - For Patients Undergoing Procedures Requiring Moderate or Deep Sedation, General Anesthesia or Regional Anesthesia    Contents of a History and Physical Examination (H&P):    The H&P shall consist of chief complaint, history of present illness, allergies and medications, relevant social and family history, past medical history, review of systems and physical examination, and assessment and plan appropriate to the patient's age.    For Patients Undergoing Procedures Requiring Moderate or Deep Sedation, General Anesthesia or Regional Anesthesia:    1. An H&P shall be performed within 24 hours prior to the procedure by a qualified member of the medical staff or designee with appropriate privileges, except as noted in item 2 below.    2. If a complete history and physical was performed within thirty (30) calendar days prior to the patient?s admission to the Medical Center for elective surgery, a member of the medical staff assumes the responsibility for the accuracy of the clinical information and will need to document in the medical record within twenty-four (24) hours of admission and prior to surgery or major invasive procedure, that they either attest that the history and physical has been reviewed and accepted, or document an update of the original history and physical relevant to the patient's current clinical status.    3. Providing an H&P for patients undergoing surgery under local anesthesia is at the discretion of the Attending Physician.     4. When a procedure is performed by a dentist, podiatrist or other practitioner who is not privileged to perform an H&P, the anesthesiologist?s assessment immediately prior to the procedure will constitute the 24 hour re-assessment.The dentist, podiatrist or other practitioner who is not privileged to perform an H&P will document the history and physical relevant to the procedure.    5. If the H&P and the written informed consent for the surgery or procedure are not recorded in the patient's medical record prior to surgery, the operation shall not be performed unless the attending physician states in writing that such a delay could lead to an adverse event or irreversible damage to the patient.    6. The above requirements shall not preclude the rendering of emergency medical or surgical care to a patient in dire circumstances.

## 2023-09-23 NOTE — Nursing Note
 Patient to MPU in no acute distress, wife and transport at bedside, all belongings sent with patient.

## 2023-09-23 NOTE — Consults
 CASE MANAGER ASSESSMENT      Admit Ijuz:[id number]    Date of Initial CM Assessment: 09/23/2023    Problems: Active Problems:    * No active hospital problems. *       Past Medical History:   Diagnosis Date    Antral ulcer 09/2023    CVA (cerebral vascular accident) (HCC/RAF)     H/O aortic valve replacement     Kidney stone     Urinary frequency     Past Surgical History:   Procedure Laterality Date    AORTIC VALVE REPLACEMENT      CARDIAC PACEMAKER PLACEMENT  09/2023    ESOPHAGOGASTRODUODENOSCOPY  09/2023          Primary Care Physician:Talishinsky, Aleksandr, MD  Phone:[phone]    CM met with patient and spouse at bedside to complete needs assessment.    LANGUAGE ASSISTANCE:   Printmaker Used?: Patient declined    NEEDS ASSESSMENT:   Is the patient able to participate in the CM Initial Assessment at this time?: Yes  Information Obtained From: Patient    Level of Function Prior to Admit: Moderate Assist    Primary Living Situation: Lives w/Capable Spouse    Pre-admission Living Situation: Home/Apartment   Primary Support Systems: Spouse/significant other    Contact Name: TRAYLON, SCHIMMING Phone Number: [phone]   Does the patient have a Family/Support System member participating in Discharge Planning?: Yes    DPOA?: No       Bathroom on Main Floor: Yes  Stairs in Home: 2       Prior Treatments / Services: DME, Home Health    Home Health Type of Service(s) Being Provided: Nursing, Physical Therapy, Occupational Therapy  Home Health Agency Name: [facility]        Type of Rental Equipment: Wheelchair      DME Owned by Patient: Wheelchair    Who is your PCP?: Talishinsky, Aleksandr    Do you have your Primary Care Doctor's office number?: Yes    How often do you visit your doctor?: Annual    Do you need information/education regarding your medical condition?: No       Were you hospitalized in the last 30 days?: Yes    READMIT ASSESSMENT: (IF APPLICABLE)     Is this a planned readmission?: No         Interview is with: Patient      In your opinion, what brought you back to the hospital?: Change in medical condition   Did you receive your DC instructions from your previous DC?: Yes   Was there anything missing from or unclear with your discharge instructions?: No   Did you speak with your Doctor before coming to the hospital?: Yes   Are you able to take ALL your medications the way they have been prescribed?: Yes   Do you feel isolated/lonely?: No       FOLLOW UP APPT QUESTIONS: (IF APPLICABLE)     Was the follow up appt made before discharge?: Yes    When was the follow-up appointment scheduled post discharge?: 4-7 days    Were you able to attend your follow-up appointment?: No      RISK STRATIFICATION: (IF APPLICABLE)     > 2 admissions within the last 12 months?: Yes    Multiple co-morbidities?: Yes    DISCHARGE ASSESSMENT:     Projected Date of Discharge:     Anticipated Complex D/C?: No  Projected Discharge to: Home    Discharge Address: [address]  [place] [address]    Projected Discharge Needs: Home Health    Support Identified at Discharge: Spouse  Name of Discharge Support Person: Foye, Damron Phone Number: [phone]     Who is available to transport you upon discharge?: Family Transportation         SDOH     Within the past 12 months, has a lack of transportation kept you from medical appointments, meetings, work, or from getting things needed for daily living? : No  How hard is it for you to pay for the very basics like food, housing, medical care, and heating?: Somewhat hard  How hard is it for you to pay for prescriptions or medical bills?: Very hard  In the past 12 months has the electric, gas, oil, or water company threatened to shut off services in your home?: No  In the last 12 months, was there a time when you were not able to pay the mortgage or rent on time?: No  In the last 12 months, how many places have you lived?: 2  In the last 12 months, was there a time when you did not have a steady place to sleep or slept in a shelter (including now)?: No    Dorothyann [id number] Terese, BSN, RN  RN Case Manager  Care Coordination Tanda Corrente [facility] Phone: [phone]

## 2023-09-23 NOTE — Progress Notes
 .Hospitalist Progress Note  Andrew [name]    Patient Name Andrew [name]   Patient MRN [id number]   Patient PCP Andrew Francisco, MD   Date of Service 09/23/2023   Hospital Day 1   Admission Date 09/22/2023       Chief Complaint  Melena (Pt discharged last night after admission for pacemaker placement on 09/14/2023. His stay was complicated by melena, underwent an EGD on 09/17/2023 which showed an antral ulceration s/p hemoclips x2. Instructed to come to ED if blood in stool persisted. +melena x3 today with subsequent presyncopal symptoms. POCT Hgb 8.8, Previous Hgb (yesterday's labs 0600) 11.5)    Interval Events / Subjective / Review of Systems  Pt pt with no acute concerns. Pending EGD with GI. No further BM this morning with blood.    Denies fevers, chills, nausea, vomiting, chest pain, and shortness of breath.    Medications   Scheduled:  amiodarone , 400 mg, Oral, BID  atorvastatin , 20 mg, Oral, Nightly   Continuous:   pantoprazole  8 mg/hr (09/23/23 1447)    sodium chloride  75 mL/hr (09/23/23 0650)      PRN:  lidocaine , melatonin oral/enteral/sublingual, ondansetron  injection/IVPB, sodium chloride      Vital Signs Ins and Outs   Temp:  [36.4 ?C (97.5 ?F)-36.9 ?C (98.4 ?F)] 36.7 ?C (98.1 ?F)  Heart Rate:  [60-75] 67  Resp:  [14-24] 21  BP: (83-114)/(51-74) 95/52  NBP Mean:  [64-84] 65  SpO2:  [94 %-98 %] 94 % I/O last 2 completed shifts:  In: 945 [P.O.:20; I.V.:675; Blood:250]  Out: 700 [Urine:700]     Physical Examination  Gen: in NAD, comfortable, cooperative, conversant  HEENT: no scleral icterus  Lungs: normal work of breathing, clear to auscultation bilaterally  Heart: irregular  Abd: soft, nondistended, nontender to palpation  Ext: no lower extremity edema bilaterally  Neuro: alert, oriented, face symmetric, no dysarthria  Psych: affect appropriate to context    Labs - Last 24 hours of interval labs personally reviewed and compared to prior values.  WBC/Hgb/Hct/Plts:  9.39/7.5/22.4/145 (06/23 9386)  Na/K/Cl/CO2/BUN/Cr/glu:  143/4.5/109/24/32/1.08/107 (06/23 9386)  AST/ALT/Bili T/Alk Phos/Prot/Alb:  19/27/0.4/47/5.4/3.2 (06/23 9386)  PT/INR/APTT/Fib:  15.5/1.2/30.5/-- (06/22 1637)      Assessment and Plan  Andrew [name] is a 68 y.o. male admitted for melena with suspicion of recurrent upper GI bleed due to PUD.  HDS.     # acute upper GI bleed likely due to PUD, POA  # acute blood loss anemia, POA   # melena, POA  - GI consulted, appreciate recs. Pending EGD.  - Plan for scope today  - trend Hg q12 CBC  - transfuse for Hg < 7  - PPI bolus -> continuous infusion  - 2 large bore Ivs  - CTA abdomen/pelvis showing no extravasation  - monitor for BM     # tachy-brady syndrome s/p PPM   # pAF  # hx of bicuspid AV s/p bioprosthetic valve  - HOLD home apixaban   - continue home amiodarone   - continue home metoprolol   - continue home statin  - continue to hold aspirin   - will c/s cardiology for discussion of anticoagulation following this recurrent GI bleed     # AKI, resolved  - trend BMP  - avoid nephrotoxins            Inpatient Checklist  Orders Placed This Encounter      Diet NPO Reason for NPO: Procedure; Except for: Ice Chips, Medications, Sips of water  DVT prophylaxis:  holding home eloquis until after procedure  Central lines: none  Tubes/Drains: none    Code Status: Full Code  Contact:  Primary Emergency Contact: Andrew [name], Home Phone: [phone]    Disposition  TBD    55 minutes were spent personally by me today on this encounter which include today's pre-visit review of the chart, obtaining appropriate history, performing an evaluation, documentation and discussion of management with details supported within the note for today's visit. The time documented was exclusive of any time spent on the separately billed procedure.     Andrew Mace, MD  Hospitalist - Internal Medicine  Pager: [phone]  09/23/2023 3:29 PM

## 2023-09-23 NOTE — Progress Notes
 Pharmaceutical Services - Medication Reconciliation Note - ED    Patient Name: Andrew [name]  Medical Record Number: [id number]  Admit date: 09/22/2023 6:48 PM    Age: 68 y.o.  Sex: male  Allergies: No Known Allergies  Height:   Most recent documented height   08/09/23 1.676 m (5' 6'')     Actual Weight:   Most recent documented weight   09/22/23 86.3 kg   09/20/23 86.3 kg     Diagnosis: The patient is currently admitted with the following concerns/issues: Active Problems:    * No active hospital problems. *      Reported Medication History   I spoke with family (spouse) at bedside and used [employer] (outpatient Rx fill history database) to update the home medication list for this hospital admission.      Surescripts pharmacy records show these medications recently filled in the past 3 months but the patient was not taking them prior to this hospital admission:   Amlodipine  5mg  - reason for not taking: MD stopped  Carvedilol  6.25mg  - reason for not taking: MD stopped  Diltiazem 120mg  - reason for not taking: MD stopped  Losartan  25mg  - reason for not taking: MD stopped    PTA Medication List (discrepancies are noted)   Current Outpatient Medications   Medication Sig Note Last Dose/Taking    ALLOPURINOL  100 mg tablet TAKE 2 TABLETS BY MOUTH EVERY DAY  09/22/2023    amiodarone  200 mg tablet Take 2 tablets (400 mg total) by mouth two (2) times daily for 6 days, THEN 1 tablet (200 mg total) daily for 24 days. 09/23/2023: Pt to start taking 1 tab daily on 09/27/2023. 09/22/2023    amoxicillin 500 mg capsule Use as directed prior to dental appointment  Taking    apixaban  5 mg tablet Take 1 tablet (5 mg total) by mouth two (2) times daily. Per H&P-  HOLD home apixaban   09/22/2023    ascorbic acid  500 mg tablet Take 1 tablet (500 mg total) by mouth daily.  09/22/2023    ATORVASTATIN  20 mg tablet TAKE 1 TABLET BY MOUTH EVERYDAY AT BEDTIME  09/21/2023    CALCIUM  PO Take 600 mg by mouth two (2) times daily.  09/22/2023    metoprolol  tartrate 50 mg tablet Take 1 tablet (50 mg total) by mouth two (2) times daily. 50 mg, Oral, 2 times daily, 60 doses, First dose on Sun 09/22/23 at 2100, Last dose on Tue 10/22/23 at 0900  For patients >/= 68 years of age: Hold if SBP <100 or HR <50   Order Details  Ordered on: 09/22/23   Discontinued on: 09/23/23   Authorizing provider: Jolanda Thedora RAMAN., MD      09/22/2023    Multiple Vitamins-Minerals (HCA SUPER THERAVITE-M PO) Take 1 tablet by mouth daily.  09/22/2023    pantoprazole  40 mg DR tablet Take 1 tablet (40 mg total) by mouth two (2) times daily. Per H&P- Infusions:  pantoprazole  8 mg/hr (09/22/23 1852)  09/22/2023    vitamin D, cholecalciferol , 25 mcg (1000 units) tablet Take 1 tablet (25 mcg total) by mouth daily.  09/22/2023    [Paused] aspirin  81 mg EC tablet Take 1 tablet (81 mg total) by mouth daily. (Patient not taking: Reported on 09/23/2023) 09/23/2023: Discontinued  Not Taking     The patient's allergies and medications have been reviewed and updated.    Medication Reconciliation Pharmacy Technician   Thedora Sieving Rapelje, 09/23/2023, 1:51 PM  ___________________________________________________________________________________________________________________  I reviewed the prior to admission medication list compiled by the medication reconciliation pharmacy technician and attest to the above.      Beers criteria reviewed with the patient's prior to admission medications.    The reconciliation of admission orders with the prior to admission medication list is complete.     Please note, accuracy is based on the patient?s/family's/caregiver's ability and willingness to provide this information at the time of interview. This progress note represents our good faith effort to obtain the best possible medication history from all attainable sources at the time of reconciliation.      There are no issues requiring follow-up at this time.    Nester Bachus L Maricela Schreur 09/23/2023 5:08 PM   Medication Reconciliation Pharmacist

## 2023-09-24 ENCOUNTER — Telehealth: Payer: Commercial Managed Care - HMO

## 2023-09-24 ENCOUNTER — Ambulatory Visit: Payer: Commercial Managed Care - Pharmacy Benefit Manager | Attending: Acute Care

## 2023-09-24 ENCOUNTER — Telehealth: Payer: PRIVATE HEALTH INSURANCE

## 2023-09-24 ENCOUNTER — Telehealth: Payer: Commercial Managed Care - Pharmacy Benefit Manager

## 2023-09-24 LAB — Basic Metabolic Panel

## 2023-09-24 LAB — CBC
RED BLOOD CELL COUNT: 2.52 x10E6/uL — ABNORMAL LOW (ref 4.41–5.95)
RED CELL DISTRIBUTION WIDTH-CV: 14.9 % (ref 11.1–15.5)
RED CELL DISTRIBUTION WIDTH-SD: 47.2 fL (ref 36.9–48.3)

## 2023-09-24 MED ADMIN — AMIODARONE HCL 200 MG PO TABS: 400 mg | ORAL | @ 16:00:00 | Stop: 2023-09-26 | NDC 60687043711

## 2023-09-24 MED ADMIN — PANTOPRAZOLE SODIUM-NACL 80-0.9 MG/100ML-% IV SOLN: 8 mg/h | INTRAVENOUS | @ 11:00:00 | Stop: 2023-09-24 | NDC 00338964801

## 2023-09-24 MED ADMIN — AMIODARONE HCL 200 MG PO TABS: 400 mg | ORAL | @ 04:00:00 | Stop: 2023-09-26 | NDC 60687043711

## 2023-09-24 MED ADMIN — PANTOPRAZOLE SODIUM-NACL 80-0.9 MG/100ML-% IV SOLN: 8 mg/h | INTRAVENOUS | @ 11:00:00 | Stop: 2023-09-24

## 2023-09-24 MED ADMIN — SODIUM CHLORIDE 0.9 % IV SOLN: 75 mL/h | INTRAVENOUS | @ 04:00:00 | Stop: 2023-09-24

## 2023-09-24 MED ADMIN — PANTOPRAZOLE SODIUM-NACL 80-0.9 MG/100ML-% IV SOLN: 8 mg/h | INTRAVENOUS | @ 14:00:00 | Stop: 2023-09-24

## 2023-09-24 MED ADMIN — PANTOPRAZOLE SODIUM-NACL 80-0.9 MG/100ML-% IV SOLN: 8 mg/h | INTRAVENOUS | @ 02:00:00 | Stop: 2023-09-24

## 2023-09-24 MED ADMIN — ATORVASTATIN CALCIUM 20 MG PO TABS: 20 mg | ORAL | @ 04:00:00 | Stop: 2023-09-29 | NDC 68084009811

## 2023-09-24 MED ADMIN — SODIUM CHLORIDE 0.9 % IV SOLN: @ 11:00:00 | Stop: 2023-09-24 | NDC 00338004918

## 2023-09-24 MED ADMIN — PANTOPRAZOLE SODIUM 40 MG IV SOLR: 40 mg | INTRAVENOUS | @ 16:00:00 | Stop: 2023-09-27 | NDC 71288060010

## 2023-09-24 MED ADMIN — PANTOPRAZOLE SODIUM-NACL 80-0.9 MG/100ML-% IV SOLN: 8 mg/h | INTRAVENOUS | @ 08:00:00 | Stop: 2023-09-24 | NDC 00338964801

## 2023-09-24 NOTE — Consults
 Occupational Therapy Evaluation & Discharge      PATIENT: Andrew [name]  MRN: [id number]  DOB: [birth year]    ADMIT DATE: 09/22/2023       Date of Evaluation: 09/24/2023    Problems: Active Problems:    * No active hospital problems. *       Past Medical History:   Diagnosis Date    Antral ulcer 09/2023    CVA (cerebral vascular accident) (HCC/RAF)     H/O aortic valve replacement     Kidney stone     Urinary frequency     Past Surgical History:   Procedure Laterality Date    AORTIC VALVE REPLACEMENT      CARDIAC PACEMAKER PLACEMENT  09/2023    ESOPHAGOGASTRODUODENOSCOPY  09/2023        Relevant Hospital Course: Melena (Pt discharged last night after admission for pacemaker placement on 09/14/2023. His stay was complicated by melena, underwent an EGD on 09/17/2023 which showed an antral ulceration s/p hemoclips x2. Instructed to come to ED if blood in stool persisted. +melena x3 today with subsequent presyncopal symptoms.     Patient Stated Goal:  To return home. Pt agreeable to OT evaluation.     Living Arrangements   Type of Home: House  Home Layout: One level  Bathroom Shower/Tub: Pension scheme manager: Midwife: Buyer, retail: Front wheeled walker, [employer]    Prior Level of Function   Level of Independence: Supervised, Functional transfers, Limited community distances, Household ambulation, Front wheeled walker  Lives With: Spouse  Support Available: Family  # of hours available: wife 24/7, dtr Banker) as needed  ADL Assistance: Independent, Activities of Daily Living, Instrumental Activities of Daily Living  Vision: Within Functional Limits  Hearing: Within Functional Limits  Transportation: Driven by Others    Precautions   Precautions: Biomedical scientist: None  Current Activity Order: Activity as tolerated  Weight Bearing Status: Not Applicable  Additional Weight Bearing Status: Not Applicable    GENERAL EVALUATION   Position: In bed  Lines/devices Drains: IV/PICC;Cardiac Monitor;Pulse Ox    Bed Mobility   Rolling: Stand by Assist  Supine to Sit: Contact Guard Assist  Sit to Supine: Nurse, mental health Transfers   Sit to Stand: Contact Guard Assist;Assistive Device (Comment) (Mod A to donn shoes, AFO, cues)  Functional Mobility: Declined (pt c/o fatigue)    Activities of Daily Living (ADLs)   LB Dressing: Performed  LB Dressing Assistance: Maximum Assist  LB Dressing Deficit: Don/doff L sock;Don/doff L shoe (AFO)  LB Dressing Where Assessed: Edge of bed    Balance   Sitting - Static: Good;without UE support  Sitting - Dynamic: Good;Fair;without UE support     RUE Assessment   RUE Assessment: Within Functional Limits     LUE Assessment   LUE Assessment: Within Functional Limits     Hand Function   Gross Grasp: Functional;Right;Left  Coordination: Functional;Right;Left    Sensation   Sensation: Grossly intact    Cognition   Cognition: At Baseline Cognitive Status  Safety Awareness: Good awareness of safety precautions  Barriers to Learning: None    Vision   Current Vision: Wears glasses only for reading       Neurological Evaluation (if indicated)   Neuro Deficits: No    Pain Assessment   Patient complains of pain: Yes  Pain Quality: Aching  Pain Scale Used: Numeric Pain Scale  Pain Intensity: 2/10  Pain Location: Abdomen  Action Taken: Nursing notified    Patient Status   Activity Tolerance: Good  Oxygen Needs: Room Air  Response to Treatment: Tolerated treatment well;Vital signs stable;Fatigued;with activity;Nursing notified  Compliance with Precautions: Good  Call light in reach: Yes  Presentation post treatment: In bed;Pulse Ox;On cardiac monitor;Lines/drains intact    Interdisciplinary Communication   Interdisciplinary Communication: Nurse    ASSESSMENT   Inpatient Recommendation: OT evaluate & discharge    Goals Discussed With: Patient    Short Term Goals to be achieved in: 7 days  Pt will toilet self: with contact guard assist  Pt will dress lower body: sitting edge of bed;sitting in chair  Pt will perform all ADLs and functional transfers: while adhering to precautions    OT Recommendations   Discharge Recommendation: Occupational Therapy;Would benefit from continued therapy  Discharge concerns: Requires supervision for self care;Requires supervision for mobility  Discharge Equipment Recommended: Owns recommended DME    AM-PAC   AM-PAC Daily Activity Raw Score: 17  AM-PAC Daily Activity t-Scale Score: 37.26  AM-PAC Daily Activity CMS 0-100% Score: 50.11 %  AM-PAC Daily Activity CMS 'G Code' Modifier: CK    Modified Rankin Scale   mRS: Slight Disability    Evaluation Completed by: Alejandra Heads Berneita Sanagustin, OTR/L,  09/24/2023

## 2023-09-24 NOTE — Telephone Encounter
 Andrew [name] from [facility] called to schedule an appointment to sleep department requesting a 1 month f/u scheduled video visit

## 2023-09-24 NOTE — Progress Notes
 [facility] DEPARTMENT OF MEDICINE  DIVISION OF GASTROENTEROLOGY  GENERAL GI CONSULT NOTE    PATIENT: Andrew [name]  MRN: [id number]  DOB: 1956/02/26    ADMISSION DATE: 09/22/2023    HOSPITAL DAY: 2  DATE OF SERVICE: 09/24/2023   ATTENDING ON SERVICE (CONSULT REQUESTOR): Leonce Artist MATSU., MD  REASON FOR CONSULT: Melena    HPI/SUBJECTIVE   Andrew [name] is a 68 y.o. male 68 y.o. w/ h/o PUD, bicuspid AV sp TAVR 2013 (bioprosthetic) cb CVA w residual LLE weakess, presented to OSH w pre-syncope transferred for reported tachy-brady now s/p  PPM placement. GI consulted for recurrent melena.     Patient discharged yesterday. Reports that was still having darker stools but not frankly melanic and just once per day at time of discharge. Discharged on apixaban  for Afib w/ tachy-brady syndrome s/p PPM. Was taking PPI though only took one PM and one AM dose. Last dose Apix 6/22 AM. Reports that this AM got out of bed, went to eat breakfast, soon after eating felt as though he was going to pass out, though he did not loose consciousness. Soon thereafter had a large, melanic bowel movement. Had two more before coming to the ED and a fourth while in the ED. Feels most recent one is smaller than those prior. Denies chest pain or worsening shortness of breath but continues to feel weak and presyncopal.    6/24: Repeat EGD yesterday, s/p Ovesco to antral ulcer, VSS, Hgb downtrended after procedure but responded adequately to transfusion this AM    MEDS   Scheduled:  amiodarone , 400 mg, Oral, BID  atorvastatin , 20 mg, Oral, Nightly  pantoprazole , 40 mg, IV Push, BID    PRN:  lidocaine , melatonin oral/enteral/sublingual, ondansetron  injection/IVPB, sodium chloride     Infusions:           PHYSICAL EXAM     Temp:  [36.2 ?C (97.2 ?F)-37 ?C (98.6 ?F)] 36.8 ?C (98.2 ?F)  Heart Rate:  [64-81] 81  Resp:  [14-24] 21  BP: (91-142)/(50-101) 102/53  NBP Mean:  [62-94] 66  SpO2:  [90 %-100 %] 93 %  BMI Readings from Last 1 Encounters:   09/24/23 33.98 kg/m? System Check if examined and normal Positive or additional negative findings   Constit  [x]  General appearance - normal     Eyes  []  Conjuctivae clear       HENMT  []  Normal Lips/teeth/gums, oropharynx, head     Neck  []  Inspection; no masses palpated     Resp  []  Effort good. No labored breathing     CV  []  Normal rhythm/rate   []  Normal pulses     Abdomen  [x]  Soft, non-tender, non-distended, no rebound guarding    []  Active bowel sounds 4 quadrants   []  No appreciable flank or shifting dullness   []  No hepatosplenomegaly   []  No umbilical hernia    Rectal   []  No external hemorrhoids or fissure   []  No masses palpated on DRE   []  Appropriate resting pressure/sphincter tone   []  Appropriate squeeze pressure   []  Appropriate bear down maneuver   []  Chaperone present for exam  Large melanic BM at bedside, melena on ED rectal exam   MSK  []  Grossly normal strength, ROM     Neuro   [x]  Grossly normal      Psychiatric  []   Oriented to time, place and person   []   Appropriate insight/judgement & mood/affect  LABS/STUDIES     Labs Independently Interpreted by me today  Recent Labs     09/24/23  1628 09/24/23  0709 09/24/23  0313 09/23/23  0129 09/22/23  1923 09/22/23  1637   WBC 7.89 7.86 8.12   < > 9.34 10.19*   NEUTPCT  --   --   --   --  69.0 78.1   HGB 7.8* 7.7* 6.1*   < > 7.4* 8.3*   HCT 24.1* 22.8* 18.5*   < > 21.7* 24.5*   PLT 156 139* 142*   < > 167 177   MCV 91.3 90.5 88.5   < > 88.6 87.8    < > = values in this interval not displayed.     Recent Labs     09/24/23  0313 09/23/23  0613   NA 144 143   K 3.9 4.5   CL 112* 109*   CO2 24 24   ANIONGAP 8 10   BUN 17 32*   CREAT 0.94 1.08     Recent Labs     09/24/23  0313 09/23/23  0613 09/22/23  1637   MG  --  1.7  --    CALCIUM  7.4* 8.1* 8.9   PHOS  --  4.1  --       Recent Labs     09/23/23  0613   TOTPRO 5.4*   ALBUMIN 3.2*   BILITOT 0.4   BILICON <0.2   ALT 27   AST 19   ALKPHOS 47     Lab Results   Component Value Date    PT 15.5 (H) 09/22/2023 INR 1.2 09/22/2023    APTT 30.5 09/22/2023     No results found for: ''FE'', ''TIBC'', ''TRNSFERR'', ''FEBINDSAT'', ''FERRITIN''  No results found for: ''VITAMINB12'', ''FOLATE''    Hemoglobin Trend:   Lab Results   Component Value Date/Time    HGB 7.8 (L) 09/24/2023 04:28 PM    HGB 7.7 (L) 09/24/2023 07:09 AM    HGB 6.1 (LL) 09/24/2023 03:13 AM    HGB 7.5 (L) 09/23/2023 06:13 AM         Imaging Tests Independently Interpreted by me Today   Last CT a/p: No results found for this or any previous visit.       Procedures  Last EGD:   Results for orders placed or performed during the hospital encounter of 09/22/23   Upper gastrointestinal endoscopy    Transcription    PATIENT NAME: Andrew [name], Andrew [name]  DATE OF BIRTH: Dec 18, 1955  RECORD NUMBER: [id number]  DATE/TIME OF PROCEDURE: 09/23/2023 / 02:42 PM  ENDOSCOPIST: Oliva Abts, MD  REFERRING PHYSICIAN:  FELLOW: Justus Fairly    INDICATIONS FOR EXAMINATION: 68 y.o. male 68 y.o. w/ h/o bicuspid AV sp TAVR 2013 (bioprosthetic) cb CVA w residual LLE weakess, presented to OSH w pre-syncope transferred for reported tachy-brady now s/p PPM placement, s/p EGD 6/17 with epi/hemoclip x2   with recurrent bleeding here for EGD  PROCEDURE PERFORMED: UPPER GI ENDOSCOPY - diagnostic    MEDICATIONS: MAC anesthesia    PROCEDURE TECHNIQUE:  Informed consent obtained for the procedure including risks, benefits and alternatives and the risk of those alternatives. Informed consent obtained for sedation including risks, benefits and alternatives and the risk of those alternatives.    EKG, pulse, pulse oximetry and blood pressure were monitored throughout the procedure.    The endoscope was passed with ease through the mouth under direct visualization; it was extended to the 2nd portion  of the duodenum. The scope was withdrawn and the mucosa was carefully examined. The views were excellent. The patient's toleration of the   procedure was excellent.    Total Withdrawl Time:  Total Insertion Time:    EXTENT OF EXAM: 2nd part of the duodenum      INSTRUMENTS: [id number] - [id number]  TECHNICAL DIFFICULTY: No  LIMITATIONS: none TOLERANCE: Good  VISUALIZATION: Good        FINDINGS:  Esophagus: The esophagus appeared to be normal without evidence of esophagitis, rings, furrows, strictures. There was no hiatal hernia.  Stomach: The stomach appeared to have no blood throughout. In the antrum there was a 5mm ulcer with a visible vessel present. epinephrine 1:10,000 was injected in 0.5cc increments in 4 quadrants for 2.5cc total. the Ovesco (12a) device was used to deploy   directly over the ulcer site for hemostasis. there were a few additional clean based ulcers in the antrum noted. The pylorus was patent. On retroflexion no additional findings were noted.  Duodenum: The duodenal bulb had mild erythema, otherwise appeared to be normal to the 2nd portion.    ESTIMATED BLOOD LOSS: None    DIAGNOSIS:  -Gastric ulcer with high risk stigmata (prior hemoclips fell off) s/p hemostasis with Ovesco clip  -Duodenopathy    RECOMMENDATIONS:  -continue PPI IV bid x72 hours, then po bid x2 months and daily therafter  -ok to start clear liquid diet  -hold anticoagulation for 3days, if no signs of bleeding on 6/26 ok to restart  -repeat EGD in 2-3 months to confirm healing of the area            This electronic signature authenticates all electronic and/or handwritten documentation, including orders, generated by the signer during the episode of care contained in this record.09/23/2023 05:08 PM by Oliva Abts, MD          I reviewed these specialist notes that directly relate to the patient's acute and/or chronic medical problems with documentation of the salient findings, if any: Gastroenterology    I have spoken with the following physicians/teams regarding the patient's care today:  Primary team    A&P   Andrew [name] is a 67 y.o. male 68 y.o. w/ h/o PUD, bicuspid AV sp TAVR 2013 (bioprosthetic) cb CVA w residual LLE weakess, presented to OSH w pre-syncope transferred for reported tachy-brady now s/p  PPM placement. GI consulted for recurrent melena.     #PUD s/p recent epi injection + hemoclip  #Melena  Known PUD with recent endoscopy and treatment. On DOAC but ASA was held given concern for recurrent bleeding. Now with multiple episodes of melena and symptomatic acute on chronic anemia. Now s/p repeat EGD with Ovesco to gastric ulcer with visible vessel. Patient remains hemodynamic stable and Hgb has remained stable since transfusion earlier today. No additional melena    ASSESSMENT & PLAN  - Maintain large-bore peripheral IV access, active T+S; transfuse for Hgb < 7, platelets < 50k, INR < 2.5  - Hold Eliquis ; resume 72 hours after procedure  - IV Pantoprazole  bolus + gtt  - Ok for regular diet  - Please have nursing upload photos of BM to Media tab       Code Status:  Full Code      Author:    Justus SAILOR. Maree MD 09/24/2023 5:01 PM  GI Fellow, pager [phone]  If after 5pm or on weekends, please page GI fellow on call    Discussed with attending gastroenterologist on service,  Dr. Ninfa    I saw and examined the patient, reviewed the patient's diagnostic testing, and discussed the case with the fellow/resident. I agree with the findings and plan as documented in the note above, which reflects our jointly developed plan.      Blenda ALONSO Ninfa, MD, MPH  [employer]  Department of Medicine, Gastroenterology  [facility] Division of Digestive Diseases

## 2023-09-24 NOTE — Progress Notes
 .Hospitalist Progress Note  [facility]    Patient Name Andrew [name]   Patient MRN [id number]   Patient PCP Jeana Francisco, MD   Date of Service 09/24/2023   Hospital Day 2   Admission Date 09/22/2023       Chief Complaint  Melena (Pt discharged last night after admission for pacemaker placement on 09/14/2023. His stay was complicated by melena, underwent an EGD on 09/17/2023 which showed an antral ulceration s/p hemoclips x2. Instructed to come to ED if blood in stool persisted. +melena x3 today with subsequent presyncopal symptoms. POCT Hgb 8.8, Previous Hgb (yesterday's labs 0600) 11.5)    Interval Events / Subjective / Review of Systems  - Pt underwent EGD yesterday and found to have prior hemoclip falling off. Now s/p hemostasis with new clip.   - Pt tolerated procedure well but with hgb drop to <7 and requiring 1U pRBC with adequate response. Pt denying any noticeable bleeding in BM.     Denies fevers, chills, nausea, vomiting, chest pain, and shortness of breath.    Medications   Scheduled:  amiodarone , 400 mg, Oral, BID  atorvastatin , 20 mg, Oral, Nightly  pantoprazole , 40 mg, IV Push, BID   Continuous:       PRN:  lidocaine , melatonin oral/enteral/sublingual, ondansetron  injection/IVPB, sodium chloride      Vital Signs Ins and Outs   Temp:  [36.2 ?C (97.2 ?F)-37 ?C (98.6 ?F)] 36.4 ?C (97.5 ?F)  Heart Rate:  [64-81] 72  Resp:  [14-24] 18  BP: (91-142)/(50-101) 96/63  NBP Mean:  [62-94] 73  SpO2:  [90 %-100 %] 95 % I/O last 2 completed shifts:  In: 1466.5 [P.O.:240; I.V.:826.5; Blood:340; Other:60]  Out: 1700 [Urine:1700]     Physical Examination  Gen: in NAD, comfortable, cooperative, conversant  HEENT: no scleral icterus  Lungs: normal work of breathing, clear to auscultation bilaterally  Heart: irregular  Abd: soft, nondistended, nontender to palpation  Ext: no lower extremity edema bilaterally  Neuro: alert, oriented, face symmetric, no dysarthria  Psych: affect appropriate to context    Labs - Last 24 hours of interval labs personally reviewed and compared to prior values.  WBC/Hgb/Hct/Plts:  7.86/7.7/22.8/139 (06/24 9290)  Na/K/Cl/CO2/BUN/Cr/glu:  144/3.9/112/24/17/0.94/100 (06/24 9686)            Assessment and Plan  Tannon Peerson is a 68 y.o. male admitted for melena with suspicion of recurrent upper GI bleed due to PUD.  HDS.     # acute upper GI bleed likely due to PUD, POA  # acute blood loss anemia, POA   # melena, POA  - CTA abdomen/pelvis showing no extravasation  - GI consulted, appreciate recs.   - s/p EGD and hemostasis with Ovesco clip  - required 1U pRBC post procedure for Hgb drop <7, no acute bleeding and HDS.   - plan for repeat EGD in 2-3 months.  - trend Hg q12 CBC  - transfuse for Hg < 7  - PPI BID  - 2 large bore Ivs  - CTM BM  - Will watch for continued bleeding post procedurally over the next 24hours.  - Ok to resume apixaban  in 72 hours post procedure if no acute bleeding. Per GI     # tachy-brady syndrome s/p PPM   # pAF  # hx of bicuspid AV s/p bioprosthetic valve  - HOLD home apixaban   - continue home amiodarone   - holding home metoprolol   - continue home statin  - continue to hold aspirin   -  consider cardiology c/s regarding DOAC iso recurrent GI bleeds       # AKI, resolved  - trend BMP  - avoid nephrotoxins            Inpatient Checklist  Orders Placed This Encounter      Diet regular    DVT prophylaxis:  holding home eloquis until after procedure  Central lines: none  Tubes/Drains: none    Code Status: Full Code  Contact:  Primary Emergency Contact: Herold Lanny Ip, Home Phone: [phone]    Disposition  TBD    55 minutes were spent personally by me today on this encounter which include today's pre-visit review of the chart, obtaining appropriate history, performing an evaluation, documentation and discussion of management with details supported within the note for today's visit. The time documented was exclusive of any time spent on the separately billed procedure. Artist Mace, MD  Hospitalist - Internal Medicine  Pager: [phone]  09/24/2023 11:41 AM

## 2023-09-24 NOTE — Nursing Note
 VS (last 24 hours):    Temp:  [36.2 ?C (97.2 ?F)-37 ?C (98.6 ?F)] 36.9 ?C (98.4 ?F)  Heart Rate:  [64-81] 72  Resp:  [14-24] 18  BP: (91-142)/(50-101) 96/63  NBP Mean:  [62-94] 73  SpO2:  [90 %-100 %] 95 %    Pain:     Shift events:    Received asleep and arousable. AOX4, MAE, with some  weakness from residual of stroke in the past. VSS. CBC was drawn and MD are aware of the result. Denied of pain. MD changed his diet to clear liquids and clarified with MD and said okay to advance diet to Regular. Tolerated meal without nausea or vomiting. No bm yet. Voiding in the urinal. Wife at the bedside.  Dr. Maree rounded and updated pt and wife re pt will stay 2 more days in th hospital to observe  Dr. Leonce came and saw pt.  OT and PT came and saw pt  Pt was up on the recliner chair and at the edge of the bed.   Ambulated with walker and sara steady to the toilet. Had X1 bm but had smear red bld only.  CBC done and H and H stable, will continue Q12 hrs bld draw      Plan:     Will be on Protonix  IVP for 2days and stay for observation  Repeat CBC for 12 hrs.      Assessment:    Neuro:  Neurological (WDL): Within Defined Limits  Neuro Symptoms: Return to WDL  Level of Consciousness: Return to [facility]  Orientation Level: Return to [facility]  Cognition: Return to [facility]  Communication: Return to WDL  R Pupil Size (mm): 3 mm  R Pupil Reaction: Return to WDL  L Pupil Size (mm): 3 mm  L Pupil Reaction: Return to [facility]  Eye Movement : Return to [facility]  Facial Symmetry: Return to [facility]  RUE Motor Strength/Response: Greater than gravity  LUE Motor Strength/Response: Greater than gravity  RLE Motor Strength/Response: Greater than gravity  LLE Motor Strength/Response: Less than gravity    Cardiac:  Cardiac (WDL): Within Defined Limits   Cardiac Rhythm: Normal sinus rhythm  Ectopy: None    Vasc:  Vascular (WDL): Exceptions to WDL  Vascular Exceptions: Pulses, Edema  Capillary Refill: Return to WDL  Edema: Right lower extremity, Left lower extremity  RLE Edema: Trace    Respiratory:  Respiratory (WDL): Within Defined Limits     Respiratory Pattern: Return to WDL  R Breath Sounds: Return to WDL  L Breath Sounds: Return to WDL        O2 Device: None (Room air)  Flow Rate (L/min): 2 L/min    GI:  Gastrointestinal (WDL): Within Defined Limits  Gastrointestinal Exceptions: Abdomen Inspection, Palpation, Bowel Sounds (All Quadrants)  Last BM Date: 09/23/23    Active Diet Orders Placed This Encounter      Diet regular    Endo:  Accu-checks: none    Glucose, Point of Care   Date Value Ref Range Status   09/18/2023 90 65 - 99 mg/dL Final     Comment:     MD notified/results read back     GU:  Genitourinary (WDL): Within Defined Limits     GU Method: Voiding    I/O this shift:  In: -   Out: 200 [Urine:200]  I/O net:  mL    Musc:  Musculoskeletal (WDL): Exceptions to WDL  Musculoskeletal Exceptions: Range of Motion  Range of Motion:  Weakness  RLE: Limited movement  LLE: Limited movement  Weight Bearing Status: Non Weight Bearing  BMAT Level: Level 3 - Yellow    Skin:  Integumentary (WDL): Within Defined Limits  Integumentary Exceptions : Skin Condition/Temp  Skin Condition/Temp: Return to [facility]  Skin Integrity: Return to [facility]  Skin Color: Pale    Incisions/wounds:  Wound 09/14/23 Incision Lateral;Left;Upper Chest Pacemaker placement incision (10)    Psych:  Psychosocial (WDL): Within Defined Limits  Psychosocial and/or Care Management Needs: Moderate Frequency  Patient Behaviors: Return to WDL    Lines/drains:  Peripheral IV 20 G Posterior;Right Wrist (2)  Peripheral IV 20 G Left;Posterior Hand (1)    Gtt:      ID:  Is the patient positive for severe sepsis/septic shock screen?: No     Temp (12hrs), Avg:36.8 ?C (98.3 ?F), Min:36.6 ?C (97.9 ?F), Max:37 ?C (98.6 ?F)    Antimicrobials:  amoxicillin    Labs:           \ 7.7     /              144     ~ 112    ~ 17      /            7.4    ~                       ~              7.86   ------ 139        ------~------~------100 /  \                     /  \           / 22.8   \               3.9     ~ 24      ~ 0.94   \                 /       \                /       \  ------------------  ------------------------  ------------      ------------  CBC: 09/24/23 9290   Chem10: 09/24/23 9686       09/24/23 0313   Coag: No Results 1d    CHG: []  AM  []  PM

## 2023-09-24 NOTE — Consults
 Physical Therapy Evaluation      PATIENT: Andrew [name]  MRN: [id number]  DOB: January 31, 1956    ADMIT DATE: 09/22/2023       Date of Evaluation: 09/24/2023    Problems: Active Problems:    * No active hospital problems. *       Past Medical History:   Diagnosis Date    Antral ulcer 09/2023    CVA (cerebral vascular accident) (HCC/RAF)     H/O aortic valve replacement     Kidney stone     Urinary frequency     Past Surgical History:   Procedure Laterality Date    AORTIC VALVE REPLACEMENT      CARDIAC PACEMAKER PLACEMENT  09/2023    ESOPHAGOGASTRODUODENOSCOPY  09/2023        Relevant Hospital Course: Andrew [name] is a 68 y.o. 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  Patient discharged from this hospital on 6/21 (admitted 6/9-21), found to have tachy-brady syndrome and received pacemaker on 6/14, hospital course c/b melena and found to have antral ulceration s/p 2 hemoclips on 6/17.  His Hg was stable around 11 (baseline 14-15) and he was discharged on 6/21 on apixaban .  He was doing well after discharge but this AM had multiple episodes of black tarry stool as well as lightheadedness, prompting this return ED visit.  Denies chest pain or pressure, SOB, edema, palpitations, nausea, vomiting, urinary symptoms.     Patient Stated Goal: to be able to walk again     Living Arrangements   Type of Home: House  Home Layout: One level  Bathroom Shower/Tub: Pension scheme manager: Midwife: Buyer, retail: Front wheeled walker, [employer]    Prior Level of Function   Level of Independence: Supervised, Functional transfers, Limited community distances, Household ambulation, Front wheeled walker  Lives With: Spouse  Support Available: Family  # of hours available: wife 24/7, dtr Banker) as needed  ADL Assistance: Independent, Activities of Daily Living, Instrumental Activities of Daily Living  Vision: Within Functional Limits  Hearing: Within Functional Limits  Transportation: Driven by Others    Precautions   Precautions: Fall risk;Monitor Vitals  Orthotic: None;Left;AFO;Right;Post-op shoe  Current Activity Order: Activity as tolerated  Weight Bearing Status: Not Applicable  Additional Weight Bearing Status: Not Applicable    GENERAL EVALUATION   Position: EOB;w/RN;Family/CG present (wife)  Lines/devices Drains: HLIV;Cardiac Monitor;Pulse Ox     Bed Mobility   Supine Scooting: Not Performed (pt left sitting EOB)  Supine to Sit: Not Performed (pt rec'd sitting up in chair)  Sit to Supine: Not Performed (pt left sitting up in chair)    Functional Mobility   Sit to Stand: Maximum Assist;Assistive Device (Comment) (max A became mod/min A w/ subsequent reps w/ FWW)  Pre-Gait: STS x3 reps w/ FWW  Ambulation: Minimum Assist  Ambulation Distance (Feet): 53ft  Gait Pattern: Shuffled;Decreased pace;Unsteady;Decreased stride length  Assistive Device: Front wheeled walker     Balance   Sitting - Static: Good;with UE support  Sitting - Dynamic: Able to weight shift anteriorly;Able to weight shift laterally;Able to weight shift posteriorly;Able to weight shift alternating sides;Fair;with UE support  Standing - Static: Posterior Lean;with UE support;Poor;Requires MOD A to maintain;Requires MAX A to maintain (poor became fair w/ subsequent reps of  STS and static standing)  Standing - Dynamic: No LOB;Requires MAX A to recover;with UE support;Poor (poor became fair w/ continued standing and initiation of ambulation)    Outcome Measures          UE Assessment   R UE Assessment: Within functional limits  L UE Assessment: Within functional limits     LE Assessment   RLE Assessment: Within Functional Limits                 LLE Assessment: Within Functional Limits                 Sensation   Sensation: Grossly intact    Cognition   Cognition: Exceptions to WDL  Arousal/Alertness: Appropriate responses to stimuli  Attention Span: Appears intact  Memory: Appears intact  Orientation Level: Oriented X4  Following Commands: Follows one step commands consistently  Safety Awareness: Good awareness of safety precautions  Barriers to Learning: None    Neurological Evaluation (if indicated)   Neuro Deficits: No      Pain Assessment   Patient complains of pain: No         Patient Status   Activity Tolerance: Fair  Oxygen Needs: Room Air;With ambulation  Response to Treatment: Fatigued;with activity;Nursing notified;Vital signs stable  Compliance with Precautions: Not Applicable  Call light in reach: Yes  Presentation post treatment: On cardiac monitor;Lines/drains intact;Pulse Ox;w/RN;Family/CG present;Edge of bed (wife present)  Comments: Pt rec'd w/ c/o fatigue, states, ''I just walked 10 minutes ago'', but agreeable to PT consult.  Pt noted to have increased diffculty perform STS w/ FWW from low level chair.  VCs and demo for proper B UE placement and cueing for reps of trunk flexion to geberate momentum for STS.  Once standing pt had x2 episodes of LOB posteriorly into chair.  On 3rd rep, pt able to achieve full standing w/ several seconds to maintain static standing balance.  Able to ambulate several steps w/ FWW then returned to EOB.  RN aware to use Andrew [name] w/ pt for increased safety w/ OOB mobility    Interdisciplinary Communication   Interdisciplinary Communication: Nurse    ASSESSMENT   Rehab Potential: Good  Inpatient Recommendation: PT treatment  Problem List: Decreased bed mobility;Decreased transfers;Decreased gait;Fall risk;Impaired balance;Decreased activity tolerance;Pain limiting function;Discharge needs  Present During Evaluation: Other reduced mobility (Z74.09)  Treatment Plan: Bed mobility training;Transfer training;Gait training;Therapeutic exercise;Coordinate with nurse to pre-medicate patient as needed;Balance training;Discharge planning;Patient and/or family education  Frequency: 3-5 x/week  Duration (days): 30  Progress Note Due Date: 10/01/23    Goals Discussed With: Patient    Short Term Goals to be achieved in: 7 days  Pt will perform bed mobility: with stand by assist  Pt will perform sit to stand: with FWW;with contact guard assist  Pt will transfer to/from bed/chair: with FWW;with contact guard assist  Pt will ambulate: 51-100 feet;with FWW;with contact guard assist    Long Term Goals to be achieved in: 14 days  Caregiver will: safely assist patient with functional mobility    PT Recommendations   Discharge Recommendation: Physical Therapy;Would benefit from continued therapy  Discharge concerns: Requires assistance for mobility;Requires assistance for self care  Discharge Equipment Recommended: Defer to discharge facility;If patient dc home instead of rehab as recommended;Owns recommended DME    AM-PAC   AM-PAC Basic Mobility Raw Score: 13  AM-PAC Basic Mobility t-Scale Score: 33.99  AM-PAC Basic Mobility CMS 0-100% Score: 57.65 %    Modified Rankin  Scale   mRS: Slight Disability    Evaluation Completed by: Rocky Hadassah Lush, PT,  09/24/2023

## 2023-09-24 NOTE — Telephone Encounter
 I/Call from Elco from [place] to inquire about scheduling in Stroke clinic. Informed Germain that referral could go for review or a request can be sent to Stroke coordinator requesting next available appt for post hospital F/U. Per Germain, Pt is currently @ [facility] and can wait for medical review which may 5 business days or less. Referral sent for review and Germain aware Pt/[facility] may be contacted to schedule when complete.

## 2023-09-24 NOTE — Telephone Encounter
 Discharge Followup Appointment Info      Date of Discharge: 09/21/2023    Discharge Disposition: Home Health Service   TIme Frame: Within 7 Days    Sched with PCP?: [name] [facility] PCP PCP Name: Jeana Francisco, MD   Was Appt Confirmed?: Neg    Barriers to Scheduling: Clinic scheduling protocol List Other Barriers, if applicable: Pt needs updated DC date for scheduling   Did you schedule any Specialty visits?: Yes    Additional Appts/Tests/Info Is the patient being discharged to SNF? No  Clinic/Test/Procedure to be scheduled: Sleep Medicine Clinic  Doctor Preferences (If applicable): first available  What studies should be completed prior to follow up (if applicable): Polysomnography to r/o OSA  Time Frame: within 1 month  Reason for Appointment/Procedure: observed episodes of O2 desaturations during hospitalization w/ apneic episodes  Sleep Fellow 7/29 8 a.m.    Clinic/Test/Procedure to be scheduled: PCP  Doctor Preferences (If applicable): Jeana Francisco, MD  Time Frame: within 1 -2 weeks  Reason for Appointment/Procedure: s/p hospitalization w/ EGD found antral ulcer on PPI, also observed episodes of O2 desaturations during hospitalization w/ apneic episodes  Pt anticipated DC date was 6/1 but currently still Inpatient.    Clinic/Test/Procedure to be scheduled:GI  Doctor Preference (If applicable): Dr. Mickiel or colleague  Time Frame:4 weeks  Reason for Appointment/Procedure:post-hospitalization follow-up for PUD, on apixaban   Andrew [name] 7/14 10 a.m.     Clinic/Test/Procedure to be scheduled:Neuro/stroke  Doctor Preference (If applicable):any  Time Frame:4-6 weeks  Reason for Appointment/Procedure:post-hospitalization follow-up for history of CVA, now tachy brady on apixaban , holding ASA due to GIB  Per Regine [phone] Coordinator will review for scheduling.

## 2023-09-25 ENCOUNTER — Ambulatory Visit: Payer: PRIVATE HEALTH INSURANCE

## 2023-09-25 LAB — Basic Metabolic Panel
ESTIMATED GFR 2021 CKD-EPI: >89 mL/min/1.73m2 (ref 8–19)
GLUCOSE: 106 mg/dL — ABNORMAL HIGH (ref 65–99)

## 2023-09-25 LAB — CBC
ABSOLUTE NUCLEATED RBC COUNT: 0.11 x10E3/uL — ABNORMAL HIGH (ref 0.00–0.00)
WHITE BLOOD CELL COUNT: 6.92 x10E3/uL (ref 4.16–9.95)

## 2023-09-25 LAB — Immunofixation, Urine

## 2023-09-25 LAB — Immunofixation, Serum

## 2023-09-25 MED ADMIN — ATORVASTATIN CALCIUM 20 MG PO TABS: 20 mg | ORAL | @ 04:00:00 | Stop: 2023-09-29 | NDC 68084009811

## 2023-09-25 MED ADMIN — PANTOPRAZOLE SODIUM 40 MG IV SOLR: 40 mg | INTRAVENOUS | @ 04:00:00 | Stop: 2023-09-27 | NDC 71288060010

## 2023-09-25 MED ADMIN — AMIODARONE HCL 200 MG PO TABS: 400 mg | ORAL | @ 06:00:00 | Stop: 2023-09-26 | NDC 60687043711

## 2023-09-25 MED ADMIN — DIATRIZOATE MEGLUMINE & SODIUM 66-10 % IN 1000 ML SW (DILUTED): 1000 mL | ORAL | @ 23:00:00 | Stop: 2023-09-25 | NDC 08252044535

## 2023-09-25 MED ADMIN — AMIODARONE HCL 200 MG PO TABS: 400 mg | ORAL | @ 05:00:00 | Stop: 2023-09-26

## 2023-09-25 MED ADMIN — METOPROLOL TARTRATE 50 MG PO TABS: 50 mg | ORAL | @ 19:00:00 | Stop: 2023-09-29 | NDC 51079080101

## 2023-09-25 MED ADMIN — PANTOPRAZOLE SODIUM 40 MG IV SOLR: 40 mg | INTRAVENOUS | @ 15:00:00 | Stop: 2023-09-27 | NDC 71288060010

## 2023-09-25 MED ADMIN — AMIODARONE HCL 200 MG PO TABS: 400 mg | ORAL | @ 15:00:00 | Stop: 2023-09-26 | NDC 60687043711

## 2023-09-25 NOTE — Nursing Note
 5:49 PM  Received patient to floor from PACU. Alert and oriented x4. VSS. No complaints of pain. Wife at bedside.

## 2023-09-25 NOTE — Other
 Patients Clinical Goal: Clinical Goal(s) for the Shift: VSS, monitor for s/s bleeding, OOB as tolerated, safety/comfort  Stability of the patient: Moderately Stable - low risk of patient condition declining or worsening   Other/Significant Events:   No report of pain, n/v this shift  No report of sob, lightheadedness or dizziness  No BM this shift  No signs of visible bleeding - hgb 7.6 this am  Will endorse care to oncoming RN    Review of Systems  Neuro: Level of Consciousness: Return to [facility] Orientation Level: Return to [facility]   Resp: O2 Device: Nasal cannula   Cardiac:Atrial paced  GI/Endocrine:Tender, All Quadrants Last BM Date: 09/24/23   Stool Appearance: Unable to assess   GU: GU Method: Voiding  Ambulatory Status/Limitations:BMAT Level: Level 3 - Yellow (FWW)  Skin:Return to [facility]  Psychosocial: Psychosocial and/or Care Management Needs: Moderate Frequency   What is most important to patient/family? : get better . go home    Evaluation of Lines/Access  Peripheral IV 20 G Posterior;Right Wrist (3)  Peripheral IV 20 G Left;Posterior Hand (2)  Overview of Vitals/Critical Labs  Patient Vitals for the past 12 hrs:   BP Temp Temp src Pulse Resp SpO2   09/25/23 0400 -- -- -- 83 -- 97 %   09/25/23 0330 118/69 36.5 ?C (97.7 ?F) Temporal 83 18 96 %   09/25/23 0038 -- -- -- 81 9 98 %   09/24/23 2331 -- -- -- 82 20 (!) 81 %   09/24/23 2232 -- -- -- 77 18 93 %   09/24/23 2226 95/66 36.8 ?C (98.2 ?F) Temporal 78 18 (!) 92 %   09/24/23 1919 104/69 36.6 ?C (97.9 ?F) Temporal 82 21 93 %

## 2023-09-25 NOTE — Telephone Encounter
 LVM requesting call back to schedule Post- hospital F/U w/any provider in Stroke clinic.

## 2023-09-25 NOTE — Progress Notes
 [facility] DEPARTMENT OF MEDICINE  DIVISION OF GASTROENTEROLOGY  GENERAL GI CONSULT NOTE    PATIENT: Andrew [name]  MRN: [id number]  DOB: 05-27-1955    ADMISSION DATE: 09/22/2023    HOSPITAL DAY: 3  DATE OF SERVICE: 09/25/2023   ATTENDING ON SERVICE (CONSULT REQUESTOR): Leonce Artist MATSU., MD  REASON FOR CONSULT: Melena    HPI/SUBJECTIVE   Juandiego Kolenovic is a 68 y.o. male 68 y.o. w/ h/o PUD, bicuspid AV sp TAVR 2013 (bioprosthetic) cb CVA w residual LLE weakess, presented to OSH w pre-syncope transferred for reported tachy-brady now s/p  PPM placement. GI consulted for recurrent melena.     Patient discharged yesterday. Reports that was still having darker stools but not frankly melanic and just once per day at time of discharge. Discharged on apixaban  for Afib w/ tachy-brady syndrome s/p PPM. Was taking PPI though only took one PM and one AM dose. Last dose Apix 6/22 AM. Reports that this AM got out of bed, went to eat breakfast, soon after eating felt as though he was going to pass out, though he did not loose consciousness. Soon thereafter had a large, melanic bowel movement. Had two more before coming to the ED and a fourth while in the ED. Feels most recent one is smaller than those prior. Denies chest pain or worsening shortness of breath but continues to feel weak and presyncopal.    6/24: Repeat EGD yesterday, s/p Ovesco to antral ulcer, VSS, Hgb downtrended after procedure but responded adequately to transfusion this AM  6/25: Bowel movements more formed though still dark, no bright red in poop, tolerating diet, having some abdominal pain with coughing and flexion of abdominal musculature, no pain with deep inspiration, no fevers, chills    MEDS   Scheduled:  amiodarone , 400 mg, Oral, BID  atorvastatin , 20 mg, Oral, Nightly  diatrizoate  meglumine -sodium (MD-Gastroview) 30 mL in water 1000 mL, 1,000 mL, Oral, Once  metoprolol  tartrate, 50 mg, Oral, BID  pantoprazole , 40 mg, IV Push, BID    PRN:  lidocaine , melatonin oral/enteral/sublingual, ondansetron  injection/IVPB    Infusions:           PHYSICAL EXAM     Temp:  [36.3 ?C (97.3 ?F)-36.8 ?C (98.2 ?F)] 36.3 ?C (97.3 ?F)  Heart Rate:  [72-88] 72  Resp:  [9-26] 18  BP: (95-118)/(59-69) 102/69  NBP Mean:  [71-80] 79  SpO2:  [67 %-98 %] 94 %  BMI Readings from Last 1 Encounters:   09/24/23 33.98 kg/m?       System Check if examined and normal Positive or additional negative findings   Constit  [x]  General appearance - normal     Eyes  []  Conjuctivae clear       HENMT  []  Normal Lips/teeth/gums, oropharynx, head     Neck  []  Inspection; no masses palpated     Resp  []  Effort good. No labored breathing     CV  []  Normal rhythm/rate   []  Normal pulses     Abdomen  [x]  Soft, non-tender, non-distended, no rebound guarding    []  Active bowel sounds 4 quadrants   []  No appreciable flank or shifting dullness   []  No hepatosplenomegaly   []  No umbilical hernia    Rectal   []  No external hemorrhoids or fissure   []  No masses palpated on DRE   []  Appropriate resting pressure/sphincter tone   []  Appropriate squeeze pressure   []  Appropriate bear down maneuver   []  Chaperone present  for exam  Large melanic BM at bedside, melena on ED rectal exam   MSK  []  Grossly normal strength, ROM     Neuro   [x]  Grossly normal      Psychiatric  []   Oriented to time, place and person   []   Appropriate insight/judgement & mood/affect             LABS/STUDIES     Labs Independently Interpreted by me today  Recent Labs     09/25/23  0345 09/24/23  1628 09/24/23  0709 09/23/23  0129 09/22/23  1923 09/22/23  1637   WBC 6.92 7.89 7.86   < > 9.34 10.19*   NEUTPCT  --   --   --   --  69.0 78.1   HGB 7.6* 7.8* 7.7*   < > 7.4* 8.3*   HCT 22.7* 24.1* 22.8*   < > 21.7* 24.5*   PLT 152 156 139*   < > 167 177   MCV 89.0 91.3 90.5   < > 88.6 87.8    < > = values in this interval not displayed.     Recent Labs     09/25/23  0345 09/24/23  0313   NA 143 144   K 3.7 3.9   CL 109* 112*   CO2 25 24   ANIONGAP 9 8   BUN 7 17 CREAT 0.90 0.94     Recent Labs     09/25/23  0345 09/24/23  0313 09/23/23  0613   MG  --   --  1.7   CALCIUM  7.9* 7.4* 8.1*   PHOS  --   --  4.1      Recent Labs     09/23/23  0613   TOTPRO 5.4*   ALBUMIN 3.2*   BILITOT 0.4   BILICON <0.2   ALT 27   AST 19   ALKPHOS 47     Lab Results   Component Value Date    PT 15.5 (H) 09/22/2023    INR 1.2 09/22/2023    APTT 30.5 09/22/2023     No results found for: ''FE'', ''TIBC'', ''TRNSFERR'', ''FEBINDSAT'', ''FERRITIN''  No results found for: ''VITAMINB12'', ''FOLATE''    Hemoglobin Trend:   Lab Results   Component Value Date/Time    HGB 7.6 (L) 09/25/2023 03:45 AM    HGB 7.8 (L) 09/24/2023 04:28 PM    HGB 7.7 (L) 09/24/2023 07:09 AM    HGB 6.1 (LL) 09/24/2023 03:13 AM         Imaging Tests Independently Interpreted by me Today   Last CT a/p: No results found for this or any previous visit.       Procedures  Last EGD:   Results for orders placed or performed during the hospital encounter of 09/22/23   Upper gastrointestinal endoscopy    Transcription    PATIENT NAME: Andrew, [name]  DATE OF BIRTH: November 07, 1955  RECORD NUMBER: [id number]  DATE/TIME OF PROCEDURE: 09/23/2023 / 02:42 PM  ENDOSCOPIST: Oliva Abts, MD  REFERRING PHYSICIAN:  FELLOW: Justus Fairly    INDICATIONS FOR EXAMINATION: 68 y.o. male 68 y.o. w/ h/o bicuspid AV sp TAVR 2013 (bioprosthetic) cb CVA w residual LLE weakess, presented to OSH w pre-syncope transferred for reported tachy-brady now s/p PPM placement, s/p EGD 6/17 with epi/hemoclip x2   with recurrent bleeding here for EGD  PROCEDURE PERFORMED: UPPER GI ENDOSCOPY - diagnostic    MEDICATIONS: MAC anesthesia    PROCEDURE TECHNIQUE:  Informed consent  obtained for the procedure including risks, benefits and alternatives and the risk of those alternatives. Informed consent obtained for sedation including risks, benefits and alternatives and the risk of those alternatives.    EKG, pulse, pulse oximetry and blood pressure were monitored throughout the procedure.    The endoscope was passed with ease through the mouth under direct visualization; it was extended to the 2nd portion of the duodenum. The scope was withdrawn and the mucosa was carefully examined. The views were excellent. The patient's toleration of the   procedure was excellent.    Total Withdrawl Time:  Total Insertion Time:    EXTENT OF EXAM: 2nd part of the duodenum      INSTRUMENTS: [id number] - [id number]  TECHNICAL DIFFICULTY: No  LIMITATIONS: none TOLERANCE: Good  VISUALIZATION: Good        FINDINGS:  Esophagus: The esophagus appeared to be normal without evidence of esophagitis, rings, furrows, strictures. There was no hiatal hernia.  Stomach: The stomach appeared to have no blood throughout. In the antrum there was a 5mm ulcer with a visible vessel present. epinephrine 1:10,000 was injected in 0.5cc increments in 4 quadrants for 2.5cc total. the Ovesco (12a) device was used to deploy   directly over the ulcer site for hemostasis. there were a few additional clean based ulcers in the antrum noted. The pylorus was patent. On retroflexion no additional findings were noted.  Duodenum: The duodenal bulb had mild erythema, otherwise appeared to be normal to the 2nd portion.    ESTIMATED BLOOD LOSS: None    DIAGNOSIS:  -Gastric ulcer with high risk stigmata (prior hemoclips fell off) s/p hemostasis with Ovesco clip  -Duodenopathy    RECOMMENDATIONS:  -continue PPI IV bid x72 hours, then po bid x2 months and daily therafter  -ok to start clear liquid diet  -hold anticoagulation for 3days, if no signs of bleeding on 6/26 ok to restart  -repeat EGD in 2-3 months to confirm healing of the area            This electronic signature authenticates all electronic and/or handwritten documentation, including orders, generated by the signer during the episode of care contained in this record.09/23/2023 05:08 PM by Oliva Abts, MD          I reviewed these specialist notes that directly relate to the patient's acute and/or chronic medical problems with documentation of the salient findings, if any: Gastroenterology    I have spoken with the following physicians/teams regarding the patient's care today:  Primary team    A&P   Deno Sida is a 68 y.o. male 68 y.o. w/ h/o PUD, bicuspid AV sp TAVR 2013 (bioprosthetic) cb CVA w residual LLE weakess, presented to OSH w pre-syncope transferred for reported tachy-brady now s/p  PPM placement. GI consulted for recurrent melena.     #PUD s/p recent epi injection + hemoclip  #Melena  Known PUD with recent endoscopy and treatment. On DOAC but ASA was held given concern for recurrent bleeding. Now with multiple episodes of melena and symptomatic acute on chronic anemia. Now s/p repeat EGD with Ovesco to gastric ulcer with visible vessel. Patient remains hemodynamic stable and Hgb has remained stable since transfusion earlier today. No additional melena    ASSESSMENT & PLAN  - Obtain CT A/P w/ contrast to evaluate abdominal pain  - Maintain large-bore peripheral IV access, active T+S; transfuse for Hgb < 7, platelets < 50k, INR < 2.5  - Hold Eliquis ; resume 72 hours after procedure  -  IV Pantoprazole  bolus + gtt  - Ok for regular diet  - Please have nursing upload photos of BM to Media tab       Code Status:  Full Code      Author:    Justus SAILOR. Maree MD 09/25/2023 3:29 PM  GI Fellow, pager [phone]  If after 5pm or on weekends, please page GI fellow on call    Discussed with attending gastroenterologist on service, Dr. Ninfa    I saw and examined the patient, reviewed the patient's diagnostic testing, and discussed the case with the fellow/resident. I agree with the findings and plan as documented in the note above, which reflects our jointly developed plan.      Blenda ALONSO Ninfa, MD, MPH  [employer]  Department of Medicine, Gastroenterology  [facility] Division of Digestive Diseases

## 2023-09-25 NOTE — Progress Notes
 .Hospitalist Progress Note  [facility]    Patient Name Andrew [name]   Patient MRN [id number]   Patient PCP Andrew Francisco, MD   Date of Service 09/25/2023   Hospital Day 3   Admission Date 09/22/2023       Chief Complaint  Melena (Pt discharged last night after admission for pacemaker placement on 09/14/2023. His stay was complicated by melena, underwent an EGD on 09/17/2023 which showed an antral ulceration s/p hemoclips x2. Instructed to come to ED if blood in stool persisted. +melena x3 today with subsequent presyncopal symptoms. POCT Hgb 8.8, Previous Hgb (yesterday's labs 0600) 11.5)    Interval Events / Subjective / Review of Systems  - pt with new epigastric pain which started yesterday. Per GI this is not likely related to procedure although possible. Will evaluate with CTAP. Continued on PPI IV BID for another day to complete 72 hours. Pt stating that epigastric pain only occurs when he contracts his abdomen. Not tender to palpation.    Denies fevers, chills, nausea, vomiting, chest pain, and shortness of breath.    Medications   Scheduled:  amiodarone , 400 mg, Oral, BID  atorvastatin , 20 mg, Oral, Nightly  diatrizoate  meglumine -sodium (MD-Gastroview) 30 mL in water 1000 mL, 1,000 mL, Oral, Once  metoprolol  tartrate, 50 mg, Oral, BID  pantoprazole , 40 mg, IV Push, BID   Continuous:       PRN:  lidocaine , melatonin oral/enteral/sublingual, ondansetron  injection/IVPB     Vital Signs Ins and Outs   Temp:  [36.3 ?C (97.3 ?F)-36.8 ?C (98.2 ?F)] 36.3 ?C (97.3 ?F)  Heart Rate:  [72-88] 72  Resp:  [9-26] 18  BP: (95-118)/(59-69) 102/69  NBP Mean:  [71-80] 79  SpO2:  [67 %-98 %] 94 % I/O last 2 completed shifts:  In: 1020 [P.O.:1020]  Out: 2700 [Urine:2700]     Physical Examination  Gen: in NAD, comfortable, cooperative, conversant  HEENT: no scleral icterus  Lungs: normal work of breathing, clear to auscultation bilaterally  Heart: irregular  Abd: soft, nondistended, nontender to palpation  Ext: no lower extremity edema bilaterally  Neuro: alert, oriented, face symmetric, no dysarthria  Psych: affect appropriate to context    Labs - Last 24 hours of interval labs personally reviewed and compared to prior values.  WBC/Hgb/Hct/Plts:  6.92/7.6/22.7/152 (06/25 0345)  Na/K/Cl/CO2/BUN/Cr/glu:  143/3.7/109/25/7/0.90/106 (06/25 0345)          Assessment and Plan  Andrew [name] is a 68 y.o. male admitted for melena with suspicion of recurrent upper GI bleed due to PUD.  HDS.     # acute upper GI bleed likely due to PUD, POA  # acute blood loss anemia, POA   # melena, POA  - CTA abdomen/pelvis showing no extravasation  - GI consulted, appreciate recs.   - s/p EGD and hemostasis with [place]ves[place] clip  - required 1U pRBC post procedure for Hgb drop <7, no acute bleeding and HDS.   - plan for repeat EGD in 2-3 months.  - trend Hg q12 CBC  - transfuse for Hg < 7  - IV PPI BID x 72hrs post procedure per GI  - 2 large bore Ivs  - CTM BM  - Will plan to resume apixaban  in 72 hours post procedure if no acute bleeding per GI    #abdominal/epigastric pain  - possibly iso of recent procedure but unlikely per GI  - Evaluate with CT/AP w/     # tachy-brady syndrome s/p PPM   #  pAF  # hx of bicuspid AV s/p bioprosthetic valve  - HOLD home apixaban  until 72 hours post procedure  - continue home amiodarone   - restarted home metoprolol   - continue home statin  - continue to hold aspirin   - consider cardiology c/s regarding DOAC iso recurrent GI bleeds       # AKI, resolved  - trend BMP  - avoid nephrotoxins        Inpatient Checklist  Orders Placed This Encounter      Diet regular    DVT prophylaxis:  holding home eloquis until after procedure  Central lines: none  Tubes/Drains: none    Code Status: Full Code  Contact:  Primary Emergency Contact: Andrew [name], Home Phone: [phone]    Disposition  TBD    53 minutes were spent personally by me today on this encounter which include today's pre-visit review of the chart, obtaining appropriate history, performing an evaluation, documentation and discussion of management with details supported within the note for today's visit. The time documented was exclusive of any time spent on the separately billed procedure.     Andrew Mace, MD  Hospitalist - Internal Medicine  Pager: [phone]  09/25/2023 3:21 PM

## 2023-09-25 NOTE — Consults
 IP CM ACTIVE DISCHARGE PLANNING  Department of Care Coordination      Admit [phone]  Anticipated Date of Discharge: 09/26/2023    Following FI:Gjrxdnw, Evan G., MD      Today's short update      Dispo: home with home health, DME commode        09/25/23 1321   Rapid IDR   Attendance - Document Daily Provider;Case manager;Social worker   Reason for ongoing hospitalization Clinical instability;Ongoing medical management   Today's progression of care on Day 2 of PPI, plan to resume eliquis  after 72 hrs   Expected Disposition Home Health   Post-Acute Recommendations Nursing  [facility] PT and OT)         Disposition     Home w/Home Health  Nursing [facility] PT, OT)  [address]  [place] [address]  Family/Support System in agreement with the current discharge plan: Yes, in agreement and participating      Home Health Coordination Status (if applicable)     Agency accepted (6/7) (Spouse requesting resumption of care for home health through [facility], per [place], the PCP is not PECOS enrolled so they are requesting if attending MD Dr. Leonce would be willing to sign the patient's plan of care if home health is necessary.)     [facility] is accepting however, needs a PECOS certified MD to sign ALL plan of care.  [facility]'s MD is unable to sign because the pt is new to the agency.  CM reopened the referral and expanded search to other [facility] agencies.    Reserved Provider:  [facility]  [address]  [place], [place] [address]  Phone: [phone]  Fax: [phone]    5:09 PM CM met pt and spouse at bedside, introduced self and CM role.  CM explained the situation with [facility] and both of them agreed to refer [facility] to the accepting agency, [facility] services.    DME or RT Equipment Status (if applicable)     (P) Referral sent-out to providers (via [employer]/Parachute) (3/7), Authorization in progress (4/7) (DME commode request was sent to [facility] via parachute and needs auth; 6/25: CM requested auth from [facility] via email)    9:17 AM [facility] will provide auth to [employer] who is the contracted vendor.     5:10 PM pt and spouse confirmed commode is available at home.  CM will notify [employer] in parachute to cancel this request.    Non-medical Transportation Arrangement Status (if applicable)     Patient/family secured                              Lauraine Dolores, BSN, RN, CMGT-BC  Department of Care Coordination and Clinical Social Work  Medicine Case Manager : Primary Team: 1  T: [phone] / C: [phone] / Pager: [phone] / Email : [email or web address]  For Weekend/Swing shift Case Management assistance contact ([email or web address])  For assistance after hours:  -PM Case Manager: Rollene Dimes RN-CM work cell: [phone])  -PM Case Manager: Skipper Chihuahua RN-CM work cell: [phone])  -ER Case Manager: [phone])

## 2023-09-25 NOTE — Nursing Note
 VS (last 24 hours):    Temp:  [36.5 ?C (97.7 ?F)-36.8 ?C (98.2 ?F)] 36.7 ?C (98.1 ?F)  Heart Rate:  [72-88] 79  Resp:  [9-26] 18  BP: (95-118)/(53-69) 103/59  NBP Mean:  [66-80] 71  SpO2:  [67 %-98 %] 89 %    Pain:     Shift events:    Received AOX4, MAE.  Complained of abdominal pain and GI And PMD are aware. Will have CT scan of the abdomen with contrast and  was ordered. VSS. SR with occasional Apaced. On RA and had episode of desating when asleep to 86-87%. Tolerated meal with nausea and  vomiting. Voiding in the urinal. No signs of bleeding noted. PPI given as ordered.  Dr. Leonce rounded and updated the pt and wife re POC.   Chat with CM Claireanne re dispo for discharge  1558 Started drinking oral contrast for CT of the abdomen. Tolerating well without nausea or vomiting. CBC was drawn.   H and H stable.No active bleeding noted.  GI team rounded and updated pt and wife.  Report given to Page, RN  Accompanied to CT scan and tolerated CT scan.  Transported to [facility] with all belongings taken.    Plan:    Will have CT scan of the Abdomen with contrast  Will stay to monitor for bleeding  Will restart Eliquis  and monitor for signs of   bleeding  Continue with PPI IV      Assessment:    Neuro:  Neurological (WDL): Within Defined Limits  Neuro Symptoms: Return to WDL  Level of Consciousness: Return to [facility]  Orientation Level: Return to [facility]  Cognition: Return to [facility]  Communication: Return to WDL  R Pupil Size (mm): 3 mm  R Pupil Reaction: Return to WDL  L Pupil Size (mm): 3 mm  L Pupil Reaction: Return to [facility]  Eye Movement : Return to [facility]  Facial Symmetry: Return to [facility]  RUE Motor Strength/Response: Greater than gravity  LUE Motor Strength/Response: Greater than gravity  RLE Motor Strength/Response: Greater than gravity  LLE Motor Strength/Response: Greater than gravity    Cardiac:  Cardiac (WDL): Within Defined Limits   Cardiac Rhythm: Normal sinus rhythm, Atrial paced, Atrial fibrillation  Ectopy: None    Vasc:  Vascular (WDL): Exceptions to WDL  Vascular Exceptions: Pulses, Edema  Capillary Refill: Return to WDL  Pulses: R Dorsalis Pedis Pulse, L Dorsalis Pedis Pulse  Edema: Right lower extremity, Left lower extremity  RLE Edema: Trace  LLE Edema: Trace    Respiratory:  Respiratory (WDL): Within Defined Limits     Respiratory Pattern: Return to WDL  R Breath Sounds: Diminished  L Breath Sounds: Diminished        O2 Device: None (Room air)  Flow Rate (L/min): 2 L/min    GI:  Gastrointestinal (WDL): Within Defined Limits  Gastrointestinal Exceptions: GI Symptoms  Last BM Date: 09/24/23    Active Diet Orders Placed This Encounter      Diet regular    Endo:  Accu-checks: none    Glucose, Point of Care   Date Value Ref Range Status   09/18/2023 90 65 - 99 mg/dL Final     Comment:     MD notified/results read back     GU:  Genitourinary (WDL): Within Defined Limits     GU Method: Voiding    No intake/output data recorded.  I/O net:  mL    Musc:  Musculoskeletal (WDL): Exceptions to [facility]  Musculoskeletal Exceptions: Range of Motion  Range of Motion: Weakness  RLE: Limited movement  LLE: Limited movement  Weight Bearing Status: Non Weight Bearing  BMAT Level: Level 3 - Yellow    Skin:  Integumentary (WDL): Within Defined Limits  Integumentary Exceptions : Skin Condition/Temp  Skin Condition/Temp: Return to [facility]  Skin Integrity: Return to [facility]  Skin Color: Pale    Incisions/wounds:  Wound 09/14/23 Incision Lateral;Left;Upper Chest Pacemaker placement incision (11)    Psych:  Psychosocial (WDL): Within Defined Limits  Psychosocial and/or Care Management Needs: Moderate Frequency  Patient Behaviors: Return to WDL    Lines/drains:  Peripheral IV 20 G Posterior;Right Wrist (3)  Peripheral IV 20 G Left;Posterior Hand (2)    Gtt:      ID:  Is the patient positive for severe sepsis/septic shock screen?: No     Temp (12hrs), Avg:36.6 ?C (97.9 ?F), Min:36.5 ?C (97.7 ?F), Max:36.7 ?C (98.1 ?F)    Antimicrobials:  amoxicillin    Labs:           \ 7.6     /              143     ~ 109    ~ 7        /            7.9    ~                       ~              6.92   ------ 152        ------~------~------106              /  \                     /  \           / 22.7   \               3.7     ~ 25      ~ 0.90   \                 /       \                /       \  ------------------  ------------------------  ------------      ------------  CBC: 09/25/23 0345   Chem10: 09/25/23 0345       09/25/23 0345   Coag: No Results 1d    CHG: []  AM  []  PM

## 2023-09-26 ENCOUNTER — Telehealth: Payer: PRIVATE HEALTH INSURANCE

## 2023-09-26 LAB — CBC
MEAN CORPUSCULAR HEMOGLOBIN: 29.4 pg (ref 26.4–33.4)
WHITE BLOOD CELL COUNT: 7.85 x10E3/uL (ref 4.16–9.95)

## 2023-09-26 LAB — Basic Metabolic Panel: ESTIMATED GFR 2021 CKD-EPI: >89 mL/min/1.73m2 (ref 8.6–10.4)

## 2023-09-26 LAB — Magnesium: MAGNESIUM: 1.7 meq/L (ref 1.4–1.9)

## 2023-09-26 MED ADMIN — PANTOPRAZOLE SODIUM 40 MG IV SOLR: 40 mg | INTRAVENOUS | @ 04:00:00 | Stop: 2023-09-27 | NDC 71288060010

## 2023-09-26 MED ADMIN — ATORVASTATIN CALCIUM 20 MG PO TABS: 20 mg | ORAL | @ 04:00:00 | Stop: 2023-09-29 | NDC 68084009811

## 2023-09-26 MED ADMIN — PANTOPRAZOLE SODIUM 40 MG IV SOLR: 40 mg | INTRAVENOUS | @ 15:00:00 | Stop: 2023-09-27 | NDC 71288060010

## 2023-09-26 MED ADMIN — METOPROLOL TARTRATE 50 MG PO TABS: 50 mg | ORAL | @ 15:00:00 | Stop: 2023-09-29

## 2023-09-26 MED ADMIN — METOPROLOL TARTRATE 50 MG PO TABS: 50 mg | ORAL | @ 04:00:00 | Stop: 2023-09-29 | NDC 51079080101

## 2023-09-26 MED ADMIN — AMIODARONE HCL 200 MG PO TABS: 400 mg | ORAL | @ 15:00:00 | Stop: 2023-09-26 | NDC 60687043711

## 2023-09-26 MED ADMIN — AMIODARONE HCL 200 MG PO TABS: 400 mg | ORAL | @ 04:00:00 | Stop: 2023-09-26 | NDC 60687043711

## 2023-09-26 MED ADMIN — IOHEXOL 350 MG/ML IV SOLN: 150 mL | INTRAVENOUS | @ 01:00:00 | Stop: 2023-09-26 | NDC 00407141493

## 2023-09-26 MED ADMIN — MAGNESIUM SULFATE 4 GM/100ML IV SOLN: 4 g | INTRAVENOUS | @ 14:00:00 | Stop: 2023-09-26 | NDC 00338171540

## 2023-09-26 NOTE — Progress Notes
 Physical Therapy Treatment      PATIENT: Andrew [name]  MRN: [id number]    Treatment Date: 09/26/2023    Patient Presentation: Position: In bed;Family/CG present  Lines/devices Drains: Cardiac Monitor;Pulse Ox;IV/PICC  Precautions   Precautions: Fall risk;Monitor Vitals  Orthotic: Left;AFO;Right;Post-op shoe  Current Activity Order: Activity as tolerated  Weight Bearing Status: Not Applicable  Additional Weight Bearing Status: Not Applicable  Cognition   Cognition: At Baseline Cognitive Status  Arousal/Alertness: Appropriate responses to stimuli  Attention Span: Appears intact  Memory: Appears intact  Orientation Level: Oriented X4  Following Commands: Follows one step commands consistently  Safety Awareness: Good awareness of safety precautions  Barriers to Learning: None  Bed Mobility   Supine to Sit: Moderate Assist;Verbal Cueing  Sit to Supine: Moderate Assist;Verbal Cueing  Functional Mobility   Sit to Stand: Moderate Assist;Verbal Cueing  Transfer(s) Performed: 3  Transfer #1: From;Bed;To;Toilet  Transfer #1 Level of Assist: Moderate Assist;Verbal Cueing  Transfer #1 Type: Stand step  Transfer #1 Asst Device: Front wheeled walker  Transfer #2: From;Toilet;To;Chair  Transfer #2 Level of Assist: Moderate Assist;Verbal Cueing  Transfer #2 Type: Stand step  Transfer #2 Asst Device: Front wheeled walker  Transfer #3: From;Chair;To;Bed  Transfer #3 Level of Assist: Moderate Assist;Verbal Cueing  Transfer #3 Type: Stand step  Transfer #3 Asst Device: Front wheeled walker  Ambulation: Moderate Assist  Ambulation Distance (Feet): 34ft+12ft  Gait Pattern: Shuffled;Decreased pace;Unsteady;Decreased stride length  Assistive Device: Front wheeled walker   Balance   Sitting - Static: Good;with UE support  Sitting - Dynamic: Able to weight shift anteriorly;Able to weight shift laterally;Able to weight shift posteriorly;Able to weight shift alternating sides;Fair;with UE support  Standing - Static: Posterior Lean;with UE support;Requires MOD A to maintain;Requires MAX A to maintain (fair -)  Standing - Dynamic: No LOB;with UE support;Requires MOD A to recover;Narrow BOS (Fair-)  AM-PAC   AM-PAC Basic Mobility Raw Score: 13  AM-PAC Basic Mobility t-Scale Score: 33.99  AM-PAC Basic Mobility CMS 0-100% Score: 57.65 %  Neurological (if indicated)   Neuro Deficits: Yes  Motor Control: Left;LE;Impaired fine motor control  Coordination: Decreased speed;Decreased accuracy  RUE Tone: Within Functional Limits  LUE Tone: Hypotonic  RLE Tone: Within Functional Limits  LLE Tone: Hypotonic  Head Control and Alignment: Within Functional Limits  Trunk Control: Within Functional Limits  Exercises   Glut Sets: 10 Reps;Seated  Quad Sets: 10 Reps;Supine  Straight Leg Raise: <5 Reps;Active;Bilateral  Bridging: 5 Reps;Active  Seated Knee Flex/Ext: 10 Reps;Active  Seated Hip Flexion: 10 Reps;Active   Pain Assessment   Patient complains of pain: No  Patient Status   Activity Tolerance: Good  Oxygen Needs: Room Air  Response to Treatment: Fatigued;with activity;Nursing notified;Vital signs stable  Compliance with Precautions: Not Applicable  Call light in reach: Yes  Presentation post treatment: On cardiac monitor;Lines/drains intact;Pulse Ox;w/RN;Family/CG present  Comments: Pt requires torso assist to sit at EOB. Once up AFO and CAM boot donned. Requires assist to standing. Holds weight posteriorly on heels and requires tactile cues for anterior weight shifting for balance. Slow and guarded with gait. Able to gait train to restroom. Extra time for pt to void, RN made aware. Taken back to chair in room and given Therex. Placed safely into bed with all needs met.  Interdisciplinary Communication   Interdisciplinary Communication: Nurse  Treatment Plan   Continue PT Treatment Plan with Focus on: Bed mobility training;Transfer training;Gait training;Balance training    PT Recommendations   Discharge Recommendation: Physical  Therapy;Would benefit from continued therapy  Discharge concerns: Requires assistance for mobility;Requires assistance for self care  Discharge Equipment Recommended: Defer to discharge facility;If patient dc home instead of rehab as recommended;Owns recommended DME    Treatment Completed by: Donnice March, PTA

## 2023-09-26 NOTE — Other
 [name] Clinical Goal:   Clinical Goal(s) for the Shift: VSS, safety, comfort  Identify possible barriers to advancing the care plan:   Stability of the patient: Moderately Stable - low risk of patient condition declining or worsening   Progression of [name] Clinical Goal:   Neuro: Within Defined Limits   BMAT: Level 3 - Yellow  Assistive Device: Walker    Vitals:    Temp:  [36.5 ?C (97.7 ?F)-37.4 ?C (99.3 ?F)] 37.3 ?C (99.1 ?F)  Heart Rate:  [60-66] 60  Resp:  [15-20] 15  BP: (96-121)/(64-83) 96/70  NBP Mean:  [77-91] 79  SpO2:  [95 %-99 %] 99 %  Cardiac:  Atrial paced  Ectopy:   Respiratory:  None (Room air)        Pain:    Last BM: 09/24/23  GU: Voiding  Skin: Within Defined Limits  Lines/Tubes/Drains:   Peripheral IV 20 G Posterior;Right Wrist (4)  Peripheral IV 20 G Left;Posterior Hand (3)       Shift Events/Acute Changes:   Patient had black BM with red streaks in AM. Photo taken and MD notified.  Patient up in AM with PT. Sat in chair for about 1 hr, then back to bed  HBG 9.1     To Do/Pending:  Transition to PO PPI tomorrow 6/26  Restart eliquis    Monitor for melena  Monitor H&H     Nursing Considerations for Discharge:       Expected Discharge Disposition: Home w/Home Health     Expected Discharge Date: 09/27/2023

## 2023-09-26 NOTE — Other
 [name] Clinical Goal:   Clinical Goal(s) for the Shift: Hemodynamic stable, VSS, pain and comfort mgt, monitor bleeding, blood count  Identify possible barriers to advancing the care plan: disease process  Stability of the patient: Moderately Stable - low risk of patient condition declining or worsening   Progression of [name] Clinical Goal:      Alert and oriented x4.  No complaints of pain.  Leg braces at bedside, to be used if patient gets out of bed.  Remains on room air with O2 sat >95%.  Normotensive, 100% A paced. Afebrile.  Tolerated diet well, no BM overnight.  Voiding via urinal.  Wife at bedside throughout shift.

## 2023-09-26 NOTE — Progress Notes
 NUTRITION IN-DEPTH SCREEN (Adult)    Admit Date: 09/22/2023     Date of Birth: 07-03-1955 Gender: male MRN: [id number]     Date of Screening 09/26/2023   Subjective: Pt seen; Reports tolerating meals and is eating well.Follows a low salt / low carb diet at home. No food allergies or weight reported.      Past Medical History:   Diagnosis Date    Antral ulcer 09/2023    CVA (cerebral vascular accident) (HCC/RAF)     H/O aortic valve replacement     Kidney stone     Urinary frequency     Past Surgical History:   Procedure Laterality Date    AORTIC VALVE REPLACEMENT      CARDIAC PACEMAKER PLACEMENT  09/2023    ESOPHAGOGASTRODUODENOSCOPY  09/2023         Anthropometrics     Height: 167.6 cm (5' 5.98'')  Admit Weight: 86.3 kg (190 lb 4.1 oz) (09/22/23 1606)  Current Weight: 89.4 kg (197 lb 1.5 oz)  BMI: 31.83  IBW: 64 kg/142 lbs  IBW %: 139  Adj BW (if applicable): 70 kg  UBW: 89.4 kg (197 lb)  UBW %: 100 %       Weight History (last 5)  Weights Weight   09/23/2023   7:02 AM --        Refused to stand up, pt. feels weak.   09/23/2023  12:50 PM 96.5 kg   09/23/2023   8:00 PM 96.5 kg   09/24/2023   6:50 AM 95.5 kg   09/26/2023   5:44 AM 89.4 kg        Allergies   Patient has no known allergies.     Cultural / Religious / Ethnic Food Preferences   None       Nutrition Prior to Admission   Per pt, follows low salt, low carb  diet at home.     Nutrition Risk Factors   Moderate Nutrition Risk Factors:  Anemia / Tachybrady syndrome  Acuity Level: 2-Moderate risk        Diet Orders     Diets/Supplements/Feeds   Diet    Diet regular     Start Date/Time: 09/24/23 0950      Number of Occurrences: Until Specified        Impression   PO % consumed: 76 to 100%  Impression: Pt selecting balanced meals, Diet tolerated well with good intake.  Denies nausea / emesis. Pt is obese-class 1, BMI 31.8 .     Diet Education   No diet education needs at this time.      FDI Target Drugs: No          Nutrition Care Plan   Plan: Continue with diet as ordered, Monitor adequacy of intake, Monitor tolerance to diet, Trend weights .        Will follow per policy    Author:  Levada Boxer, DTR, pager [phone]  09/26/2023 1:18 PM

## 2023-09-26 NOTE — Telephone Encounter
 Call Back Request      Reason for call back: Patient wife called in and would like to know if patient can be seen at the hospital room 5161 for the incision check ? Wife would like a call back . denario bagot wife  [phone]    Any Symptoms:  []  Yes  [x]  No      If yes, what symptoms are you experiencing:    Duration of symptoms (how long):    Have you taken medication for symptoms (OTC or Rx):      If call was taken outside of clinic hours:    [] Patient or caller has been notified that this message was sent outside of normal clinic hours.     [] Patient or caller has been warm transferred to the physician's answering service. If applicable, patient or caller informed to please call us  back if symptoms progress.  Patient or caller has been notified of the turnaround time of 1-2 business day(s).

## 2023-09-26 NOTE — Progress Notes
 Pharmaceutical Services - Heparin  Consult Management Note    Objective Data:   Allergies: Patient has no known allergies.  Height:   Most recent documented height   08/09/23 1.676 m (5' 6'')     Actual Weight:   Most recent documented weight   09/26/23 89.4 kg     Current Labs:  Recent Labs     09/26/23  1945   APTT 26.3     No results for input(s): ''HEPANTIXAUFH'' in the last 48 hours.  Recent Labs     09/26/23  1329   WBC 7.87   HGB 9.1*   HCT 28.7*   MCV 92.6   PLT 112*       INR   Date Value Ref Range Status   09/26/2023 0.9 . Final     Comment:     Therapeutic Range: INR 2-3  Mechanical Valves: INR 2.5-3.5       Bilirubin,Total   Date Value Ref Range Status   09/23/2023 0.4 0.1 - 1.2 mg/dL Final       Procedures:  Active/pending/resulted orders +/- 24hrs       None            Assessment:  Protocol: Very Low Intensity: Anti-Xa goal 0.1-0.3 or aPTT goal 46-66  Anti-Xa aPTT Hold Infusion Rate Change Check Anti-Xa/aPTT   <0.1 <46 - Increase by 2 units/kg/hr 6 hours after rate change   0.1-0.3 46-66 Continue current rate  Every 6 hours   0.31-0.4 66.1-77 - Decrease by 1 unit/kg/hr 6 hours after rate change   0.41-0.6 77.1-98 - Decrease by 2 units/kg/hr    0.61-0.8 98.1-118 Hold for 1 hour Decrease by 3 units/kg/hr    0.81-1 118.1-140 Hold for 1 hour Decrease by 4 units/kg/hr    >1 >140 Hold infusion and repeat STAT anti-Xa/aPTT in 1 hour. Notify provider to assess the patient for bleeding. Approval to resume heparin  must be received from the provider prior to restarting heparin  infusion based on the following. Pharmacist will document the provider?s name on the daily progress note.   If anti-Xa <= 0.7 or aPTT <= 108, restart infusion and decrease by 5 units/kg/hr. Repeat anti-Xa/aPTT 6 hours after rate change.  If anti-Xa > 0.7 or aPTT > 108, continue to hold and repeat anti-Xa/aPTT in 2 hours. Notify provider for further discussion.*    *When anti-Xa <= 0.5 or aPTT <= 87, restarting infusion at <= 50% of the previous rate may be considered.    Indication: Afib/Aflutter  Goal: anti-Xa 0.1-0.3 (last apixaban  PTA, prior to 6/22)  Dosing weight: 89 kg    68 y.o. 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     6/23: S/p upper GI endoscopy: Gastric ulcer with high risk stigmata (prior hemoclips fell off) s/p hemostasis with Ovesco clip. Per GI: hold anticoagulation for 3days, if no signs of bleeding on 6/26 ok to restart.    6/26: Heparin  initiated at very low intensity given recurrent UGIB. Dr. Reino confirmed GI cleared to restart heparin  this evening, aware of bloody BM in AM.       Plan:  Andrew [name] is a 68 y.o. male who has been referred to pharmacy for therapeutic heparin  management.     Baseline labs ordered. Anti-Xa <0.10 - will initiate with anti-Xa monitoring.  PLTs slowly downtrending, CTM - was 112 this afternoon. Hgb stable at 9.1.    Bolus: No bolus at this time  Infusion: Initiate at 450 units/hr (5 units/kg/hr, previous admission required lower rates of 260 units/hr though with aPTT monitoring)  Next anti-Xa/aPTT ordered at 0400 on 6/27.     Plan was communicated to RN.  Pharmacy will continue to monitor the patient?s clinical progress and communicate any changes with treatment team.  For questions about this patient's heparin  therapy, please call [phone] [facility]) or [phone] [facility]) or *[phone] [facility]).    Cem Kosman, PharmD, 09/26/2023, 8:29 PM

## 2023-09-26 NOTE — Telephone Encounter
 Returned patient's wife's call and let her know our inpatient team will see him tomorrow for incision check while hospitalized.

## 2023-09-26 NOTE — Progress Notes
 [facility] DEPARTMENT OF MEDICINE  DIVISION OF GASTROENTEROLOGY  GENERAL GI CONSULT NOTE    PATIENT: Andrew [name]  MRN: [id number]  DOB: Nov 10, 1955    ADMISSION DATE: 09/22/2023    HOSPITAL DAY: 4  DATE OF SERVICE: 09/26/2023   ATTENDING ON SERVICE (CONSULT REQUESTOR): Leonce Artist MATSU., MD  REASON FOR CONSULT: Melena    HPI/SUBJECTIVE   Andrew [name] is a 68 y.o. male 68 y.o. w/ h/o PUD, bicuspid AV sp TAVR 2013 (bioprosthetic) cb CVA w residual LLE weakess, presented to OSH w pre-syncope transferred for reported tachy-brady now s/p  PPM placement. GI consulted for recurrent melena.     Patient discharged yesterday. Reports that was still having darker stools but not frankly melanic and just once per day at time of discharge. Discharged on apixaban  for Afib w/ tachy-brady syndrome s/p PPM. Was taking PPI though only took one PM and one AM dose. Last dose Apix 6/22 AM. Reports that this AM got out of bed, went to eat breakfast, soon after eating felt as though he was going to pass out, though he did not loose consciousness. Soon thereafter had a large, melanic bowel movement. Had two more before coming to the ED and a fourth while in the ED. Feels most recent one is smaller than those prior. Denies chest pain or worsening shortness of breath but continues to feel weak and presyncopal.    6/24: Repeat EGD yesterday, s/p Ovesco to antral ulcer, VSS, Hgb downtrended after procedure but responded adequately to transfusion this AM  6/25: Bowel movements more formed though still dark, no bright red in poop, tolerating diet, having some abdominal pain with coughing and flexion of abdominal musculature, no pain with deep inspiration, no fevers, chills  6/25: Dark BM with some surrounding red, remains VSS, tolerating diet without nausea, vomiting, melena or hematemesis    MEDS   Scheduled:  atorvastatin , 20 mg, Oral, Nightly  metoprolol  tartrate, 50 mg, Oral, BID  pantoprazole , 40 mg, IV Push, BID    PRN:  lidocaine , melatonin oral/enteral/sublingual, ondansetron  injection/IVPB    Infusions:           PHYSICAL EXAM     Temp:  [36.5 ?C (97.7 ?F)-37.4 ?C (99.3 ?F)] 37.3 ?C (99.1 ?F)  Heart Rate:  [60-66] 63  Resp:  [15-19] 17  BP: (96-113)/(64-83) 103/67  NBP Mean:  [76-91] 76  SpO2:  [94 %-99 %] 94 %  BMI Readings from Last 1 Encounters:   09/26/23 31.81 kg/m?       System Check if examined and normal Positive or additional negative findings   Constit  [x]  General appearance - normal     Eyes  []  Conjuctivae clear       HENMT  []  Normal Lips/teeth/gums, oropharynx, head     Neck  []  Inspection; no masses palpated     Resp  []  Effort good. No labored breathing     CV  []  Normal rhythm/rate   []  Normal pulses     Abdomen  [x]  Soft, non-tender, non-distended, no rebound guarding    []  Active bowel sounds 4 quadrants   []  No appreciable flank or shifting dullness   []  No hepatosplenomegaly   []  No umbilical hernia    Rectal   []  No external hemorrhoids or fissure   []  No masses palpated on DRE   []  Appropriate resting pressure/sphincter tone   []  Appropriate squeeze pressure   []  Appropriate bear down maneuver   []  Chaperone present for exam  Large melanic BM at bedside, melena on ED rectal exam   MSK  []  Grossly normal strength, ROM     Neuro   [x]  Grossly normal      Psychiatric  []   Oriented to time, place and person   []   Appropriate insight/judgement & mood/affect             LABS/STUDIES     Labs Independently Interpreted by me today  Recent Labs     09/26/23  1329 09/26/23  0529 09/25/23  1526   WBC 7.87 7.85 6.00   HGB 9.1* 8.6* 8.1*   HCT 28.7* 25.6* 25.7*   PLT 112* 177 178   MCV 92.6 91.1 91.8     Recent Labs     09/26/23  0529 09/25/23  0345   NA 140 143   K 4.0 3.7   CL 104 109*   CO2 27 25   ANIONGAP 9 9   BUN 7 7   CREAT 0.90 0.90     Recent Labs     09/26/23  0529 09/25/23  0345 09/24/23  0313   MG 1.7  --   --    CALCIUM  8.4* 7.9* 7.4*      No results for input(s): ''TOTPRO'', ''ALBUMIN'', ''BILITOT'', ''BILICON'', ''ALT'', ''AST'', ''ALKPHOS'' in the last 72 hours.    Lab Results   Component Value Date    PT 15.5 (H) 09/22/2023    INR 1.2 09/22/2023    APTT 30.5 09/22/2023     No results found for: ''FE'', ''TIBC'', ''TRNSFERR'', ''FEBINDSAT'', ''FERRITIN''  No results found for: ''VITAMINB12'', ''FOLATE''    Hemoglobin Trend:   Lab Results   Component Value Date/Time    HGB 9.1 (L) 09/26/2023 01:29 PM    HGB 8.6 (L) 09/26/2023 05:29 AM    HGB 8.1 (L) 09/25/2023 03:26 PM    HGB 7.6 (L) 09/25/2023 03:45 AM         Imaging Tests Independently Interpreted by me Today   Last CT a/p: No results found for this or any previous visit.       Procedures  Last EGD:   Results for orders placed or performed during the hospital encounter of 09/22/23   Upper gastrointestinal endoscopy    Transcription    PATIENT NAME: Andrew [name], Andrew [name]  DATE OF BIRTH: [birth year]  RECORD NUMBER: [id number]  DATE/TIME OF PROCEDURE: 09/23/2023 / 02:42 PM  ENDOSCOPIST: Oliva Abts, MD  REFERRING PHYSICIAN:  FELLOW: Justus Fairly    INDICATIONS FOR EXAMINATION: 68 y.o. male 68 y.o. w/ h/o bicuspid AV sp TAVR 2013 (bioprosthetic) cb CVA w residual LLE weakess, presented to OSH w pre-syncope transferred for reported tachy-brady now s/p PPM placement, s/p EGD 6/17 with epi/hemoclip x2   with recurrent bleeding here for EGD  PROCEDURE PERFORMED: UPPER GI ENDOSCOPY - diagnostic    MEDICATIONS: MAC anesthesia    PROCEDURE TECHNIQUE:  Informed consent obtained for the procedure including risks, benefits and alternatives and the risk of those alternatives. Informed consent obtained for sedation including risks, benefits and alternatives and the risk of those alternatives.    EKG, pulse, pulse oximetry and blood pressure were monitored throughout the procedure.    The endoscope was passed with ease through the mouth under direct visualization; it was extended to the 2nd portion of the duodenum. The scope was withdrawn and the mucosa was carefully examined. The views were excellent. The patient's toleration of the   procedure was excellent.    Total Withdrawl Time:  Total Insertion Time:    EXTENT OF EXAM: 2nd part of the duodenum      INSTRUMENTS: [id number] - [id number]  TECHNICAL DIFFICULTY: No  LIMITATIONS: none TOLERANCE: Good  VISUALIZATION: Good        FINDINGS:  Esophagus: The esophagus appeared to be normal without evidence of esophagitis, rings, furrows, strictures. There was no hiatal hernia.  Stomach: The stomach appeared to have no blood throughout. In the antrum there was a 5mm ulcer with a visible vessel present. epinephrine 1:10,000 was injected in 0.5cc increments in 4 quadrants for 2.5cc total. the Ovesco (12a) device was used to deploy   directly over the ulcer site for hemostasis. there were a few additional clean based ulcers in the antrum noted. The pylorus was patent. On retroflexion no additional findings were noted.  Duodenum: The duodenal bulb had mild erythema, otherwise appeared to be normal to the 2nd portion.    ESTIMATED BLOOD LOSS: None    DIAGNOSIS:  -Gastric ulcer with high risk stigmata (prior hemoclips fell off) s/p hemostasis with Ovesco clip  -Duodenopathy    RECOMMENDATIONS:  -continue PPI IV bid x72 hours, then po bid x2 months and daily therafter  -ok to start clear liquid diet  -hold anticoagulation for 3days, if no signs of bleeding on 6/26 ok to restart  -repeat EGD in 2-3 months to confirm healing of the area            This electronic signature authenticates all electronic and/or handwritten documentation, including orders, generated by the signer during the episode of care contained in this record.09/23/2023 05:08 PM by Oliva Abts, MD          I reviewed these specialist notes that directly relate to the patient's acute and/or chronic medical problems with documentation of the salient findings, if any: Gastroenterology    I have spoken with the following physicians/teams regarding the patient's care today:  Primary team    A&P   Andrew [name] is a 68 y.o. male 68 y.o. w/ h/o PUD, bicuspid AV sp TAVR 2013 (bioprosthetic) cb CVA w residual LLE weakess, presented to OSH w pre-syncope transferred for reported tachy-brady now s/p  PPM placement. GI consulted for recurrent melena.     #PUD s/p recent epi injection + hemoclip  #Melena  Known PUD with recent endoscopy and treatment. On DOAC but ASA was held given concern for recurrent bleeding. Now with multiple episodes of melena and symptomatic acute on chronic anemia. Now s/p repeat EGD with Ovesco to gastric ulcer with visible vessel. Patient remains hemodynamic stable and Hgb has remained stable since transfusion earlier today. No additional melena    ASSESSMENT & PLAN  - Start heparin  gtt tonight for Afib; monitor closely for evidence of GI bleeding  - Maintain large-bore peripheral IV access, active T+S; transfuse for Hgb < 7, platelets < 50k, INR < 2.5  - Following completion of 72 hours of high-dose IV PPI, start pantoprazole  40 mg BID for 8 weeks  - Ok for regular diet  - Please have nursing upload photos of BM to Media tab     Code Status:  Full Code      Author:    Justus SAILOR. Maree MD 09/26/2023 7:16 PM  GI Fellow, pager [phone]  If after 5pm or on weekends, please page GI fellow on call    Discussed with attending gastroenterologist on service, Dr. Ninfa    I saw and examined the patient, reviewed the patient's diagnostic testing, and discussed  the case with the fellow/resident. I agree with the findings and plan as documented in the note above, which reflects our jointly developed plan.      Blenda ALONSO Glasser, MD, MPH  [employer]  Department of Medicine, Gastroenterology  [facility] Division of Digestive Diseases

## 2023-09-26 NOTE — Progress Notes
 .Hospitalist Progress Note  [facility]    Patient Name Andrew [name]   Patient MRN [id number]   Patient PCP Andrew Francisco, MD   Date of Service 09/26/2023   Hospital Day 4   Admission Date 09/22/2023       Chief Complaint  Melena (Pt discharged last night after admission for pacemaker placement on 09/14/2023. His stay was complicated by melena, underwent an EGD on 09/17/2023 which showed an antral ulceration s/p hemoclips x2. Instructed to come to ED if blood in stool persisted. +melena x3 today with subsequent presyncopal symptoms. POCT Hgb 8.8, Previous Hgb (yesterday's labs 0600) 11.5)    Interval Events / Subjective / Review of Systems  - Pt states his abdominal paini s no longer there from yesterday.   - Endorses having new bloody BM this AM, (pic in the chart)  - Has no changes in vitals.  - Will be switched to PO PPI tomorrow.    Denies fevers, chills, nausea, vomiting, chest pain, and shortness of breath.    Medications   Scheduled:  atorvastatin , 20 mg, Oral, Nightly  metoprolol  tartrate, 50 mg, Oral, BID  pantoprazole , 40 mg, IV Push, BID   Continuous:       PRN:  lidocaine , melatonin oral/enteral/sublingual, ondansetron  injection/IVPB     Vital Signs Ins and Outs   Temp:  [36.3 ?C (97.3 ?F)-37.4 ?C (99.3 ?F)] 36.5 ?C (97.7 ?F)  Heart Rate:  [60-66] 60  Resp:  [15-20] 15  BP: (96-121)/(64-83) 96/70  NBP Mean:  [77-91] 79  SpO2:  [95 %-99 %] 99 % I/O last 2 completed shifts:  In: 840 [P.O.:840]  Out: 1300 [Urine:1300]     Physical Examination  Gen: in NAD, comfortable, cooperative, conversant  HEENT: no scleral icterus  Lungs: normal work of breathing, clear to auscultation bilaterally  Heart: irregular  Abd: soft, nondistended, nontender to palpation  Ext: no lower extremity edema bilaterally  Neuro: alert, oriented, face symmetric, no dysarthria  Psych: affect appropriate to context    Labs - Last 24 hours of interval labs personally reviewed and compared to prior values.  WBC/Hgb/Hct/Plts: 7.85/8.6/25.6/177 (06/26 9470)  Na/K/Cl/CO2/BUN/Cr/glu:  140/4.0/104/27/7/0.90/98 (06/26 0529)          Assessment and Plan  Andrew [name] is a 68 y.o. male admitted for melena with suspicion of recurrent upper GI bleed due to PUD.  HDS.     # acute upper GI bleed likely due to PUD, POA  # acute blood loss anemia, POA   # melena, POA  #Antral Ulcer (s/p Ovesco clip 6/23)  - CTA abdomen/pelvis showing no extravasation  - s/p EGD and hemostasis with Ovesco clip in ulver of the antrum  - required 1U pRBC post procedure for Hgb drop <7  Plan  - GI consulted, appreciate recs  - Repeating CBC today to f/u blood BM.  - plan for repeat EGD in 2-3 months.  - trend Hg qday  - transfuse for Hg < 7  - IV PPI BID x 72hrs post procedure per GI, finishes tonight  - will transition to PO PPI BID 6/27  - 2 large bore Ivs  - CTM BM  - will f/u GI today regarding plan to restart DOAC    #abdominal/epigastric pain (Resolved)  - possibly iso of recent procedure but unlikely per GI  - Evaluated with CT/AP. No acute pathology.     # tachy-brady syndrome s/p PPM   # pAF  # hx of bicuspid AV s/p bioprosthetic  valve  - HOLD home apixaban  until 72 hours post procedure, will continue iso of recent bloody BM, pending GI recs.  - continue home amiodarone   - c/w home metoprolol   - continue home statin  - continue to hold aspirin   - consider cardiology c/s regarding DOAC iso recurrent GI bleeds       # AKI, resolved  - trend BMP  - avoid nephrotoxins        Inpatient Checklist  Orders Placed This Encounter      Diet regular    DVT prophylaxis:  holding home eloquis until after procedure  Central lines: none  Tubes/Drains: none    Code Status: Full Code  Contact:  Primary Emergency Contact: Andrew [name], Home Phone: [phone]    Disposition  TBD    51 minutes were spent personally by me today on this encounter which include today's pre-visit review of the chart, obtaining appropriate history, performing an evaluation, documentation and discussion of management with details supported within the note for today's visit. The time documented was exclusive of any time spent on the separately billed procedure.     Artist Mace, MD  Hospitalist - Internal Medicine  Pager: [phone]  09/26/2023 12:57 PM

## 2023-09-27 LAB — Basic Metabolic Panel
POTASSIUM: 4.1 mmol/L (ref 3.6–5.3)
SODIUM: 143 mmol/L (ref 135–146)

## 2023-09-27 LAB — Unfractionated Heparin, by anti-Xa
HEPARIN UNFRACTIONATED: <0.1 [IU]/mL
HEPARIN UNFRACTIONATED: 0.11 [IU]/mL
HEPARIN UNFRACTIONATED: 0.11 [IU]/mL

## 2023-09-27 LAB — Prothrombin Time Panel: PROTHROMBIN TIME: 12.4 s (ref 11.5–14.4)

## 2023-09-27 LAB — PEP, Serum: BETA GLOBULINS %: 13.1 % (ref 7.9–13.7)

## 2023-09-27 LAB — APTT: APTT: 26.3 s (ref 24.4–36.2)

## 2023-09-27 LAB — CBC
PLATELET COUNT, AUTO: 215 x10E3/uL (ref 143–398)
RED CELL DISTRIBUTION WIDTH-CV: 14.9 % (ref 11.1–15.5)

## 2023-09-27 LAB — PEP Urine, Random

## 2023-09-27 MED ADMIN — METOPROLOL TARTRATE 50 MG PO TABS: 50 mg | ORAL | @ 03:00:00 | Stop: 2023-09-29 | NDC 51079080101

## 2023-09-27 MED ADMIN — METOPROLOL TARTRATE 50 MG PO TABS: 50 mg | ORAL | @ 16:00:00 | Stop: 2023-09-29 | NDC 51079080101

## 2023-09-27 MED ADMIN — PANTOPRAZOLE SODIUM 40 MG PO TBEC: 40 mg | ORAL | @ 16:00:00 | Stop: 2023-09-29 | NDC 60687073609

## 2023-09-27 MED ADMIN — ATORVASTATIN CALCIUM 20 MG PO TABS: 20 mg | ORAL | @ 03:00:00 | Stop: 2023-09-29 | NDC 68084009811

## 2023-09-27 MED ADMIN — HEPARIN (PORCINE) IN NACL 25000-0.45 UT/250ML-% IV SOLN: 450 [IU]/h | INTRAVENOUS | @ 05:00:00 | Stop: 2023-09-28 | NDC 63323051701

## 2023-09-27 MED ADMIN — AMIODARONE HCL 200 MG PO TABS: 200 mg | ORAL | @ 19:00:00 | Stop: 2023-09-29 | NDC 60687043711

## 2023-09-27 MED ADMIN — PANTOPRAZOLE SODIUM 40 MG IV SOLR: 40 mg | INTRAVENOUS | @ 03:00:00 | Stop: 2023-09-27 | NDC 71288060010

## 2023-09-27 NOTE — Other
 [name] Clinical Goal:   Clinical Goal(s) for the Shift: VSS, Maintain afety. No increase in oxygen requirements  Identify possible barriers to advancing the care plan:   Stability of the patient: Moderately Stable - low risk of patient condition declining or worsening   Progression of [name] Clinical Goal:   Neuro: Within Defined Limits   BMAT: Level 3 - Yellow  Assistive Device:      Vitals:    Temp:  [36.5 ?C (97.7 ?F)-37 ?C (98.6 ?F)] 36.9 ?C (98.4 ?F)  Heart Rate:  [61-65] 62  Resp:  [16-23] 20  BP: (98-125)/(64-90) 107/64  NBP Mean:  [76-101] 78  SpO2:  [94 %-99 %] 96 %  Cardiac:  Atrial paced  Ectopy:   Respiratory:  None (Room air)        Pain:    Last BM: 09/27/23  GU: Voiding  Skin: Intact except incision  Lines/Tubes/Drains:   Peripheral IV 20 G Posterior;Right Wrist (5)  Peripheral IV 20 G Left;Posterior Hand (4)       Shift Events/Acute Changes:   Passed dark BM/maroon color. Pic to media.   Up to commode with Two person assist and walker  Heparin  @ 450 units/hr  PPI given PO     To Do/Pending:  DC Heparin  gtt @ 2100, start Apixaban .  Monitor for Melena  Monitor H/H     Nursing Considerations for Discharge:       Expected Discharge Disposition: Home w/Home Health     Expected Discharge Date: 09/29/2023

## 2023-09-27 NOTE — Progress Notes
 Pharmaceutical Services - Heparin  Consult Management Note    Objective Data:   Allergies: Patient has no known allergies.  Height:   Most recent documented height   08/09/23 1.676 m (5' 6'')     Actual Weight:   Most recent documented weight   09/27/23 89.1 kg     Current Labs:  Recent Labs     09/26/23  1945   APTT 26.3     Recent Labs     09/27/23  0439   HEPANTIXAUFH 0.11     Recent Labs     09/27/23  0439   WBC 6.77   HGB 8.8*   HCT 28.0*   MCV 91.8   PLT 215       INR   Date Value Ref Range Status   09/26/2023 0.9 . Final     Comment:     Therapeutic Range: INR 2-3  Mechanical Valves: INR 2.5-3.5       Bilirubin,Total   Date Value Ref Range Status   09/23/2023 0.4 0.1 - 1.2 mg/dL Final       Procedures:  Active/pending/resulted orders +/- 24hrs       None            Assessment:  Protocol: Very Low Intensity: Anti-Xa goal 0.1-0.3 or aPTT goal 46-66  Anti-Xa aPTT Hold Infusion Rate Change Check Anti-Xa/aPTT   <0.1 <46 - Increase by 2 units/kg/hr 6 hours after rate change   0.1-0.3 46-66 Continue current rate  Every 12 hours   0.31-0.4 66.1-77 - Decrease by 1 unit/kg/hr 6 hours after rate change   0.41-0.6 77.1-98 - Decrease by 2 units/kg/hr    0.61-0.8 98.1-118 Hold for 1 hour Decrease by 3 units/kg/hr    0.81-1 118.1-140 Hold for 1 hour Decrease by 4 units/kg/hr    >1 >140 Hold infusion and repeat STAT anti-Xa/aPTT in 1 hour. Notify provider to assess the patient for bleeding. Approval to resume heparin  must be received from the provider prior to restarting heparin  infusion based on the following. Pharmacist will document the provider?s name on the daily progress note.   If anti-Xa <= 0.7 or aPTT <= 108, restart infusion and decrease by 5 units/kg/hr. Repeat anti-Xa/aPTT 6 hours after rate change.  If anti-Xa > 0.7 or aPTT > 108, continue to hold and repeat anti-Xa/aPTT in 2 hours. Notify provider for further discussion.*    *When anti-Xa <= 0.5 or aPTT <= 87, restarting infusion at <= 50% of the previous rate may be considered.    Indication: Afib/Aflutter  Goal: anti-Xa 0.1-0.3 (last apixaban  PTA, prior to 6/22)  Dosing weight: 89 kg    68 y.o. 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     6/23: S/p upper GI endoscopy: Gastric ulcer with high risk stigmata (prior hemoclips fell off) s/p hemostasis with Ovesco clip. Per GI: hold anticoagulation for 3days, if no signs of bleeding on 6/26 ok to restart.  6/25: CT A/P - No acute CT abnormality in the abdomen or pelvis. Interval placement of Ovesco clip in the gastric antrum, otherwise no significant change since prior CT  6/26: Heparin  initiated at very low intensity given recurrent UGIB. Dr. Reino confirmed GI cleared to restart heparin  this evening, aware of bloody BM in AM.      Plan:  Andrew [name]  is a 68 y.o. male who has been referred to pharmacy for therapeutic heparin  management.     aXa @ 0439 was within goal at 0.11  Continue 450 units/hr   Next anti-Xa at 1030     Pharmacy will continue to monitor the patient?s clinical progress and communicate any changes with treatment team.  For questions about this patient's heparin  therapy, please call [phone] [facility]) or [phone] [facility]) or *[phone] [facility]).    Threasa Carletta Marker, PharmD, 09/27/2023, 6:42 AM    Addendum:  Last  anti-Xa  at 1125 was 0.11, which is within goal.    Plan:   Heparin  rate: Continue at 450 units/hr  Next anti-Xa ordered at 1600.     Pharmacy will continue to monitor the patient?s clinical progress and communicate any changes with treatment team. For questions about this patient's heparin  therapy, please call [phone] [facility]) or [phone] [facility]).    Threasa Carletta Marker, PharmD 09/27/2023 12:49 PM     Addendum:  Last  anti-Xa was 0.11 @ 1622 which is therapeutic on heparin  450 units/hr. Plan:   Heparin  rate: Continue at 450 units/hr  Next anti-Xa ordered at 0400 on 6/28 - q12h.     Pharmacy will continue to monitor the patient?s clinical progress and communicate any changes with treatment team.     For questions about this patient's heparin  therapy, please call [phone] [facility]) or [phone] [facility]).    Andrew [name] 09/27/2023 5:27 PM

## 2023-09-27 NOTE — Consults
 IP CM ACTIVE DISCHARGE PLANNING  Department of Care Coordination      Admit Date:09/22/23  Anticipated Date of Discharge: 09/29/2023    Following FI:Gjrxdnw, Evan G., MD    Today's short update      09/27/23 1309   Rapid IDR   Attendance - Document Daily Provider;Case manager;Social worker   Reason for ongoing hospitalization Clinical instability;Ongoing medical management   Today's progression of care Came in for Anemia and GI bleed. On heparin  drip transitioning to eliquis . Dispo Plan: Home with [facility].   Anticipated Consult GI;CARDS   Expected Disposition Home Health     Disposition     (P) Home w/Home Health  (P) Other (Comment), Nursing (HH PT OT)  (P) [address] [place] [address]  Family/Support System in agreement with the current discharge plan: (P) Yes, in agreement and participating    Multidisciplinary Team Member Plan of Care   Interdisciplinary rounds were conducted with the multidisciplinary team including the clinical social worker and nurse case manager. The patient's plan of care and discharge plan were discussed and formulated based on the patient's specific needs.    Home Health Coordination Status (if applicable)     (P) Resumption of care    Reserved Provider:  [facility]  [address]  [place], [place] [address]  Phone: [phone]  Fax: [phone]    DME or RT Equipment Status (if applicable)     (P) Authorization in progress (4/7)    Non-medical Transportation Arrangement Status (if applicable)     (P) Patient/family secured        Andrew Aloysius Riff, MSN, RN  Department of Care Coordination and Clinical Social Work      For Assistance After Hours:   Weekdays Swing Shift: Pager: [phone]  ED CM: [phone] Pager: [phone]  Weekends: [phone] Pager: [phone]     Float Case Manager  Primary Team: Float CM    Email: [email or web address]  Mobile Phone #: [phone]  Pager: [phone]    Department of Care Coordination Main Office:  Phone: [phone] Fax: [phone]

## 2023-09-27 NOTE — Consults
 Inpatient Cardiac Electrophysiology Consultation    DATE OF SERVICE: 09/27/2023  PATIENT: Andrew [name]   MRN: [id number]  DOB: 18-Aug-1955    REASON FOR CONSULTATION: Tachy-brady syndrome    19M admitted for further management of tachy brady syndrome    MEDICAL PROBLEMS  Tachy-brady syndrome  Status post implantation of a dual chamber pacemaker (Medtronic Azure) September 14, 2023  Supraventricular tachycardia  Paroxysmal atrial fibrillation (CHADSVASc score = 4 due to age, stroke, and hypertension)  Anticoagulation: apixaban  5mg  twice daily  Rate control: diltiazem and previously metoprolol   History of bicuspid aortic valve stenosis  Status post surgical, bioprosthetic aortic valve replacement January 2013 [facility] Ease #25; Dr. Talitha; [facility])  Postoperative stroke, with residual left-sided and right lower extremity weakness    HISTORY OF PRESENT ILLNESS:    Index diagnosis of arrhythmia was that of paroxysmal atrial fibrillation following elective surgical bioprosthetic aortic valve replacement at [facility] in 2013; he did not have recurrence following this isolated episode and thus was not managed with antiarrhythmic nor anticoagulation regimen.      He presented to the hospital twice more in May 2025, once for treatment of dyspnea and suspected heart failure, and again for palpitation, where he was diagnosed with recurrence of atrial fibrillation and rapid ventricular rates; this was managed with diltiazem and apixaban . As he re-presented for palpitation and dizziness on Aug 31, 2023 to [facility]) he was treated with IV diltiazem and found to have subsequent pauses up to 5 seconds' duration. He does not recall symptoms, but rather than he was given a diagnosis of tachy-brady syndrome and recommended to undergo pacemaker placement.      Initiated on amiodarone  09/10/2023 for treatment of atrial arrhythmia; subsequently had recurrence of SVT (likely AT) with symptomatic conversion pause.     Underwent pacemaker implantation 09/14/2023 without complication. Thereafter had recurrence of atrial arrhythmia temporized with amiodarone .     Re-hospitalized for management of melena thereafter.       PAST MEDICAL HISTORY:  Past Medical History:   Diagnosis Date    Antral ulcer 09/2023    CVA (cerebral vascular accident) (HCC/RAF)     H/O aortic valve replacement     Kidney stone     Urinary frequency        PAST SURGICAL HISTORY:  Past Surgical History:   Procedure Laterality Date    AORTIC VALVE REPLACEMENT      CARDIAC PACEMAKER PLACEMENT  09/2023    ESOPHAGOGASTRODUODENOSCOPY  09/2023       FAMILY HISTORY:  Family History   Problem Relation Age of Onset    No Known Problems Mother     Other (gastric ulcer) Father     Uterine cancer Sister     Stroke Brother     Kidney failure Brother     Colon cancer Neg Hx     Lung cancer Neg Hx     Prostate cancer Neg Hx        SOCIAL HISTORY:  Social History     Socioeconomic History    Marital status: Married   Tobacco Use    Smoking status: Former     Current packs/day: 0.00     Average packs/day: 1 pack/day for 15.0 years (15.0 ttl pk-yrs)     Types: Cigarettes     Start date: 05/03/1975     Quit date: 05/02/1990     Years since quitting: 33.4    Smokeless tobacco: Never   Substance  and Sexual Activity    Alcohol use: Not Currently     Comment: occasionally has red wine    Drug use: Not Currently     Social Drivers of Health     Financial Resource Strain: Medium Risk (09/23/2023)    Financial Resource Strain     Difficulty of Paying Living Expenses: Somewhat hard       OUTPATIENT MEDICATIONS:  Current Facility-Administered Medications   Medication Dose Route Frequency    amiodarone  tab 200 mg  200 mg Oral Daily    [COMPLETED] amiodarone  tab 400 mg  400 mg Oral BID    atorvastatin  tab 20 mg  20 mg Oral Nightly    [COMPLETED] diatrizoate  meglumine -sodium (MD-Gastroview) 30 mL in water 1000 mL oral soln  1,000 mL Oral Once    heparin  25,000 units in 0.45% NaCl 250 mL drip RTU  450 Units/hr Intravenous Continuous    heparin  per pharmacy   Does not apply Continuous    [COMPLETED] iohexol  (Omnipaque ) 350 mg/mL inj 150 mL  150 mL Intravenous Once    [COMPLETED] iohexol  (Omnipaque ) 350 mg/mL inj 150 mL  150 mL Intravenous Once    lidocaine  4% cream   Topical Once PRN    [COMPLETED] magnesium  sulfate 4 g in water for injection 100 mL RTU  4 g Intravenous Once    melatonin tab 3 mg  3 mg Oral QHS PRN    [COMPLETED] metoclopramide  5 mg/mL inj 5 mg  5 mg IV Push Once    metoprolol  tartrate tab 50 mg  50 mg Oral BID    ondansetron  4 mg/2 mL inj 4 mg  4 mg Intravenous Q6H PRN    [EXPIRED] pantoprazole  80 mg in sodium chloride  100 mL drip RTU  8 mg/hr Intravenous Continuous    pantoprazole  DR tab 40 mg  40 mg Oral BID    [COMPLETED] pantoprazole  inj 40 mg  40 mg IV Push BID    [COMPLETED] pantoprazole  inj 80 mg  80 mg IV Push STAT    [EXPIRED] sodium chloride  0.9% IV soln   Intravenous PRN    [EXPIRED] sodium chloride  0.9% IV soln   Intravenous PRN    [COMPLETED] sodium chloride  0.9% IV soln        [DISCONTINUED] amiodarone  tab 400 mg  400 mg Oral BID    [DISCONTINUED] fentaNYL  (PF) 50 mcg/mL inj 25 mcg  25 mcg Intravenous Q10 Min PRN    [DISCONTINUED] heparin  per pharmacy   Does not apply Continuous    [DISCONTINUED] HYDROmorphone 1 mg/mL inj 0.2 mg  0.2 mg IV Push Q10 Min PRN    [DISCONTINUED] metoprolol  tartrate tab 50 mg  50 mg Oral BID    [DISCONTINUED] pantoprazole  80 mg in sodium chloride  100 mL drip RTU  8 mg/hr Intravenous Continuous    [DISCONTINUED] pantoprazole  inj 40 mg  40 mg IV Push Q12H    [DISCONTINUED] prochlorperazine 10 mg/2 mL inj 5 mg  5 mg Intravenous Once PRN    [DISCONTINUED] sodium chloride  0.9% IV soln  75 mL/hr Intravenous Continuous    [DISCONTINUED] sodium chloride  0.9% IV soln   Intravenous PRN     Facility-Administered Medications Ordered in Other Encounters   Medication Dose Route Frequency    [COMPLETED] magnesium  sulfate 4 g in water for injection 100 mL RTU  4 g Intravenous Once    [COMPLETED] sodium chloride  0.9% IV soln        [DISCONTINUED] allopurinol  tab 200 mg  200 mg Oral  Daily    [DISCONTINUED] amiodarone  tab 200 mg  200 mg Oral Daily    [DISCONTINUED] amiodarone  tab 400 mg  400 mg Oral BID    [DISCONTINUED] apixaban  tab 5 mg  5 mg Oral BID    [DISCONTINUED] ascorbic acid  tab 500 mg  500 mg Oral Daily with breakfast    [DISCONTINUED] aspirin  chew tab 81 mg  81 mg Oral Daily    [DISCONTINUED] atorvastatin  tab 20 mg  20 mg Oral Nightly    [DISCONTINUED] calcium  carbonate 1250 mg (500 mg elemental) tab 1,250 mg  1,250 mg Oral Daily with breakfast    [DISCONTINUED] ePHEDrine  50 mg/mL inj   Intravenous PRN    [DISCONTINUED] lidocaine  PF 1% inj   Intravenous PRN    [DISCONTINUED] metoprolol  tartrate tab 25 mg  25 mg Oral Q6H    [DISCONTINUED] metoprolol  tartrate tab 50 mg  50 mg Oral BID    [DISCONTINUED] pantoprazole  inj 40 mg  40 mg IV Push Q12H    [DISCONTINUED] propofol  200 mg/20 mL inj   Intravenous PRN    [DISCONTINUED] propofol  200 mg/20 mL inj   Intravenous Continuous PRN    [DISCONTINUED] simethicone  chew tab 80 mg  80 mg Oral QID PRN    [DISCONTINUED] sodium chloride  0.9% IV soln   Intravenous Continuous PRN    [DISCONTINUED] succinylcholine  20 mg/mL inj   Intravenous PRN    [DISCONTINUED] vitamin D (cholecalciferol ) tab 25 mcg  25 mcg Oral Daily        ALLERGIES:  No Known Allergies    INPATIENT SCHEDULED MEDICATIONS:   amiodarone   200 mg Oral Daily    atorvastatin   20 mg Oral Nightly    metoprolol  tartrate  50 mg Oral BID    pantoprazole   40 mg Oral BID       PHYSICAL EXAMINATION:  VITALS: BP 98/65 (BP Location: Right arm, Patient Position: Lying)  ~ Pulse 61  ~ Temp 36.5 ?C (97.7 ?F) (Oral)  ~ Resp 16  ~ Wt 196 lb 6.9 oz (89.1 kg)  ~ SpO2 96%  ~ BMI 31.70 kg/m?    Temp:  [36.5 ?C (97.7 ?F)-37.3 ?C (99.1 ?F)] 36.5 ?C (97.7 ?F)  Heart Rate:  [61-65] 61  Resp:  [16-23] 16  BP: (98-125)/(65-90) 98/65  NBP Mean:  [76-101] 76  SpO2:  [94 %-99 %] 96 %        Intake/Output Summary (Last 24 hours) at 09/27/2023 1207  Last data filed at 09/27/2023 1000  Gross per 24 hour   Intake 656.25 ml   Output 1625 ml   Net -968.75 ml       Exam:   General: No acute distress  HEENT: no overt masses nor deviation. JVP not assessed.   CV: Regular rate by peripheral exam.   Resp: No frank wheezes nor crackles.  GI: nondistended    MSK: No significant edema  Neuro: grossly non focal      LABORATORY:  Lab Results   Component Value Date    WBC 6.77 09/27/2023    HGB 8.8 (L) 09/27/2023    HCT 28.0 (L) 09/27/2023    MCV 91.8 09/27/2023    PLT 215 09/27/2023    Lab Results   Component Value Date    K 4.1 09/27/2023    MG 1.7 09/26/2023    CREAT 0.93 09/27/2023    Lab Results   Component Value Date    ALT 27 09/23/2023    AST 19 09/23/2023  ALKPHOS 47 09/23/2023    BILITOT 0.4 09/23/2023      Lab Results   Component Value Date    INR 0.9 09/26/2023    APTT 26.3 09/26/2023    Lab Results   Component Value Date    TSH 4.1 09/10/2023    Lab Results   Component Value Date    HGBA1C 6.4 (H) 05/25/2023     Lab Results   Component Value Date    TROPONIN 9 (H) 09/22/2023         DIAGNOSTIC STUDIES:    Telemetry review 09/18/2023 at 18:36: initiation of SVT with PAC:      Termination of same episode after approximately 20 seconds' duration (ends with a ventricular complex):       12-lead ECG 09/19/2023 at 04:45: sinus rhythm at 68 bpm, PR interval , and QRS duration :       TTE 08/08/2023: LV EF 60-65% with normal LV size and moderate concentric LVH.      Incision evaluation 09/27/2023: clean, dry, and intact:         ASSESSMENT:    27M admitted for further management of tachy brady syndrome    MEDICAL PROBLEMS  Tachy-brady syndrome  Status post implantation of a dual chamber pacemaker (Medtronic Azure) September 14, 2023  Supraventricular tachycardia  Paroxysmal atrial fibrillation (CHADSVASc score = 4 due to age, stroke, and hypertension)  Anticoagulation: apixaban  5mg  twice daily  Rate control: diltiazem and previously metoprolol   History of bicuspid aortic valve stenosis  Status post surgical, bioprosthetic aortic valve replacement January 2013 [facility] Ease #25; Dr. Talitha; [facility])  Postoperative stroke, with residual left-sided and right lower extremity weakness    DISCUSSION:    Cardiac Electrophysiology was consulted for continued management of symptomatic supraventricular tachycardia. Following uncomplicated pacemaker implantation for tachy-brady syndrome, favor use of antiarrhythmic and rate control medication as tolerated.  As outpatient, EP study and possible ablation can be considered if SVT recurs despite use of medication.     Incision evaluation completed 09/27/2023 without abnormalities.     RECOMMENDATIONS:    Incision evaluation completed and normal.   Contact Cardiac Electrophysiology service for additional questions/concerns.     Bebe Farr, MD  Clinical Cardiac Electrophysiology Fellow    ATTENDING ADDENDUM:  I agree with the findings, examination, assessment, and plan as documented by the fellow physician, and verified the documentation above, with edits as appropriate.    Eva Hanly, MD  Cardiac Electrophysiology

## 2023-09-27 NOTE — Other
 [name] Clinical Goal:   Clinical Goal(s) for the Shift: stable vital signs, comfort, safety  Identify possible barriers to advancing the care plan: none  Stability of the patient: Moderately Stable - low risk of patient condition declining or worsening   Progression of [name] Clinical Goal:   Neuro: Within Defined Limits   BMAT: Level 3 - Yellow  Assistive Device: Walker    Vitals:    Temp:  [36.5 ?C (97.7 ?F)-37.3 ?C (99.1 ?F)] 36.8 ?C (98.2 ?F)  Heart Rate:  [60-65] 63  Resp:  [15-23] 18  BP: (96-125)/(67-90) 125/90  NBP Mean:  [76-101] 101  SpO2:  [94 %-99 %] 99 %  Cardiac:  Atrial paced  Ectopy:None  Respiratory:  Nasal cannula  Pain: denies pain  Last BM: 09/24/23  GU: Voiding  Skin: Within Defined Limits  Lines/Tubes/Drains:   Peripheral IV 20 G Posterior;Right Wrist (5)  Peripheral IV 20 G Left;Posterior Hand (4)    Shift Events/Acute Changes:   Uneventful shift, no acute changes overnight.  Started on Heparin  drip 450 units/hr, last anti-Xa therapeutic, no signs/symptoms of bleeding.     To Do/Pending:  Continue to monitor and implement current plan of care.      Expected Discharge Disposition: Home w/Home Health     Expected Discharge Date: 09/28/2023

## 2023-09-27 NOTE — Progress Notes
 [facility] DEPARTMENT OF MEDICINE  DIVISION OF GASTROENTEROLOGY  GENERAL GI CONSULT NOTE    PATIENT: Andrew [name]  MRN: [id number]  DOB: [birth year]    ADMISSION DATE: 09/22/2023    HOSPITAL DAY: 5  DATE OF SERVICE: 09/27/2023   ATTENDING ON SERVICE (CONSULT REQUESTOR): Leonce Artist MATSU., MD  REASON FOR CONSULT: Melena    HPI/SUBJECTIVE   Andrew [name] is a 68 y.o. male 68 y.o. w/ h/o PUD, bicuspid AV sp TAVR 2013 (bioprosthetic) cb CVA w residual LLE weakess, presented to OSH w pre-syncope transferred for reported tachy-brady now s/p  PPM placement. GI consulted for recurrent melena.     Patient discharged yesterday. Reports that was still having darker stools but not frankly melanic and just once per day at time of discharge. Discharged on apixaban  for Afib w/ tachy-brady syndrome s/p PPM. Was taking PPI though only took one PM and one AM dose. Last dose Apix 6/22 AM. Reports that this AM got out of bed, went to eat breakfast, soon after eating felt as though he was going to pass out, though he did not loose consciousness. Soon thereafter had a large, melanic bowel movement. Had two more before coming to the ED and a fourth while in the ED. Feels most recent one is smaller than those prior. Denies chest pain or worsening shortness of breath but continues to feel weak and presyncopal.    6/24: Repeat EGD yesterday, s/p Ovesco to antral ulcer, VSS, Hgb downtrended after procedure but responded adequately to transfusion this AM  6/25: Bowel movements more formed though still dark, no bright red in poop, tolerating diet, having some abdominal pain with coughing and flexion of abdominal musculature, no pain with deep inspiration, no fevers, chills  6/25: Dark BM with some surrounding red, remains VSS, tolerating diet without nausea, vomiting, melena or hematemesis  6/25: Normal BM (with dark component) in uploaded to media tab, Hgb uptrending, remains vitally stable, tolerating diet, on heparin  gtt through today    MEDS Scheduled:  amiodarone , 200 mg, Oral, Daily  apixaban , 5 mg, Oral, BID  atorvastatin , 20 mg, Oral, Nightly  metoprolol  tartrate, 50 mg, Oral, BID  pantoprazole , 40 mg, Oral, BID    PRN:  lidocaine , melatonin oral/enteral/sublingual, ondansetron  injection/IVPB    Infusions:   heparin  25,000 units/250 mL drip 450 Units/hr (09/26/23 2130)            PHYSICAL EXAM     Temp:  [36.5 ?C (97.7 ?F)-37 ?C (98.6 ?F)] 36.9 ?C (98.4 ?F)  Heart Rate:  [61-65] 62  Resp:  [16-23] 20  BP: (98-125)/(64-90) 107/64  NBP Mean:  [76-101] 78  SpO2:  [94 %-99 %] 96 %  BMI Readings from Last 1 Encounters:   09/27/23 31.70 kg/m?       System Check if examined and normal Positive or additional negative findings   Constit  [x]  General appearance - normal     Eyes  []  Conjuctivae clear       HENMT  []  Normal Lips/teeth/gums, oropharynx, head     Neck  []  Inspection; no masses palpated     Resp  []  Effort good. No labored breathing     CV  []  Normal rhythm/rate   []  Normal pulses     Abdomen  [x]  Soft, non-tender, non-distended, no rebound guarding    []  Active bowel sounds 4 quadrants   []  No appreciable flank or shifting dullness   []  No hepatosplenomegaly   []  No umbilical hernia  Rectal   []  No external hemorrhoids or fissure   []  No masses palpated on DRE   []  Appropriate resting pressure/sphincter tone   []  Appropriate squeeze pressure   []  Appropriate bear down maneuver   []  Chaperone present for exam     MSK  []  Grossly normal strength, ROM     Neuro   [x]  Grossly normal      Psychiatric  []   Oriented to time, place and person   []   Appropriate insight/judgement & mood/affect             LABS/STUDIES     Labs Independently Interpreted by me today  Recent Labs     09/27/23  0439 09/26/23  1329 09/26/23  0529   WBC 6.77 7.87 7.85   HGB 8.8* 9.1* 8.6*   HCT 28.0* 28.7* 25.6*   PLT 215 112* 177   MCV 91.8 92.6 91.1     Recent Labs     09/27/23  0439 09/26/23  0529   NA 143 140   K 4.1 4.0   CL 106 104   CO2 27 27   ANIONGAP 10 9   BUN 9 7   CREAT 0.93 0.90     Recent Labs     09/27/23  0439 09/26/23  0529 09/25/23  0345   MG  --  1.7  --    CALCIUM  8.3* 8.4* 7.9*      No results for input(s): ''TOTPRO'', ''ALBUMIN'', ''BILITOT'', ''BILICON'', ''ALT'', ''AST'', ''ALKPHOS'' in the last 72 hours.    Lab Results   Component Value Date    PT 12.4 09/26/2023    INR 0.9 09/26/2023    APTT 26.3 09/26/2023     No results found for: ''FE'', ''TIBC'', ''TRNSFERR'', ''FEBINDSAT'', ''FERRITIN''  No results found for: ''VITAMINB12'', ''FOLATE''    Hemoglobin Trend:   Lab Results   Component Value Date/Time    HGB 8.8 (L) 09/27/2023 04:39 AM    HGB 9.1 (L) 09/26/2023 01:29 PM    HGB 8.6 (L) 09/26/2023 05:29 AM    HGB 8.1 (L) 09/25/2023 03:26 PM         Imaging Tests Independently Interpreted by me Today   Last CT a/p: No results found for this or any previous visit.       Procedures  Last EGD:   Results for orders placed or performed during the hospital encounter of 09/22/23   Upper gastrointestinal endoscopy    Transcription    PATIENT NAME: Andrew [name]  DATE OF BIRTH: [birth year]  RECORD NUMBER: [id number]  DATE/TIME OF PROCEDURE: 09/23/2023 / 02:42 PM  ENDOSCOPIST: Oliva Abts, MD  REFERRING PHYSICIAN:  FELLOW: Justus Fairly    INDICATIONS FOR EXAMINATION: 68 y.o. male 68 y.o. w/ h/o bicuspid AV sp TAVR 2013 (bioprosthetic) cb CVA w residual LLE weakess, presented to OSH w pre-syncope transferred for reported tachy-brady now s/p PPM placement, s/p EGD 6/17 with epi/hemoclip x2   with recurrent bleeding here for EGD  PROCEDURE PERFORMED: UPPER GI ENDOSCOPY - diagnostic    MEDICATIONS: MAC anesthesia    PROCEDURE TECHNIQUE:  Informed consent obtained for the procedure including risks, benefits and alternatives and the risk of those alternatives. Informed consent obtained for sedation including risks, benefits and alternatives and the risk of those alternatives.    EKG, pulse, pulse oximetry and blood pressure were monitored throughout the procedure.    The endoscope was passed with ease through the mouth under direct visualization; it was extended to the  2nd portion of the duodenum. The scope was withdrawn and the mucosa was carefully examined. The views were excellent. The patient's toleration of the   procedure was excellent.    Total Withdrawl Time:  Total Insertion Time:    EXTENT OF EXAM: 2nd part of the duodenum      INSTRUMENTS: [id number] - [id number]  TECHNICAL DIFFICULTY: No  LIMITATIONS: none TOLERANCE: Good  VISUALIZATION: Good        FINDINGS:  Esophagus: The esophagus appeared to be normal without evidence of esophagitis, rings, furrows, strictures. There was no hiatal hernia.  Stomach: The stomach appeared to have no blood throughout. In the antrum there was a 5mm ulcer with a visible vessel present. epinephrine 1:10,000 was injected in 0.5cc increments in 4 quadrants for 2.5cc total. the Ovesco (12a) device was used to deploy   directly over the ulcer site for hemostasis. there were a few additional clean based ulcers in the antrum noted. The pylorus was patent. On retroflexion no additional findings were noted.  Duodenum: The duodenal bulb had mild erythema, otherwise appeared to be normal to the 2nd portion.    ESTIMATED BLOOD LOSS: None    DIAGNOSIS:  -Gastric ulcer with high risk stigmata (prior hemoclips fell off) s/p hemostasis with Ovesco clip  -Duodenopathy    RECOMMENDATIONS:  -continue PPI IV bid x72 hours, then po bid x2 months and daily therafter  -ok to start clear liquid diet  -hold anticoagulation for 3days, if no signs of bleeding on 6/26 ok to restart  -repeat EGD in 2-3 months to confirm healing of the area            This electronic signature authenticates all electronic and/or handwritten documentation, including orders, generated by the signer during the episode of care contained in this record.09/23/2023 05:08 PM by Oliva Abts, MD          I reviewed these specialist notes that directly relate to the patient's acute and/or chronic medical problems with documentation of the salient findings, if any: Gastroenterology    I have spoken with the following physicians/teams regarding the patient's care today:  Primary team    A&P   Jodeci Roarty is a 68 y.o. male 68 y.o. w/ h/o PUD, bicuspid AV sp TAVR 2013 (bioprosthetic) cb CVA w residual LLE weakess, presented to OSH w pre-syncope transferred for reported tachy-brady now s/p  PPM placement. GI consulted for recurrent melena.     #PUD s/p recent epi injection + hemoclip  #Melena  Known PUD with recent endoscopy and treatment. On DOAC but ASA was held given concern for recurrent bleeding. Now with multiple episodes of melena and symptomatic acute on chronic anemia. Now s/p repeat EGD with Ovesco to gastric ulcer with visible vessel. Patient remains hemodynamic stable and Hgb has remained stable since transfusion earlier today. No additional melena    ASSESSMENT & PLAN  - Ok to start DOAC this evening given stability of Hgb and absence of overt GI bleeding  - Maintain large-bore peripheral IV access, active T+S; transfuse for Hgb < 7, platelets < 50k  - Continue pantoprazole  40 mg BID for 8 weeks  - Ok for regular diet  - Please have nursing upload photos of BM to Media tab  - Patient will need follow-up with GI on discharge to discuss timing of repeat EGD     Code Status:  Full Code      Author:    Justus SAILOR. Maree MD 09/27/2023 6:02 PM  GI  Fellow, pager [phone]  If after 5pm or on weekends, please page GI fellow on call    Discussed with attending gastroenterologist on service, Dr. Ninfa    I saw and examined the patient, reviewed the patient's diagnostic testing, and discussed the case with the fellow/resident. I agree with the findings and plan as documented in the note above, which reflects our jointly developed plan.      Blenda ALONSO Ninfa, MD, MPH  [employer]  Department of Medicine, Gastroenterology  [facility]

## 2023-09-27 NOTE — Progress Notes
 .Hospitalist Progress Note  [facility]    Patient Name Andrew [name]   Patient MRN [id number]   Patient PCP Jeana Francisco, MD   Date of Service 09/27/2023   Hospital Day 5   Admission Date 09/22/2023       Chief Complaint  Melena (Pt discharged last night after admission for pacemaker placement on 09/14/2023. His stay was complicated by melena, underwent an EGD on 09/17/2023 which showed an antral ulceration s/p hemoclips x2. Instructed to come to ED if blood in stool persisted. +melena x3 today with subsequent presyncopal symptoms. POCT Hgb 8.8, Previous Hgb (yesterday's labs 0600) 11.5)    Interval Events / Subjective / Review of Systems  - pt with no acute complaints this morning.   - Pt had a dark maroon BM (pic in chart) but remains HDS, and Hgb stable.  - updated on plan to monitor inpatient while restarting AC  - Denies fevers, chills, nausea, vomiting, chest pain, and shortness of breath.    Medications   Scheduled:  amiodarone , 200 mg, Oral, Daily  atorvastatin , 20 mg, Oral, Nightly  metoprolol  tartrate, 50 mg, Oral, BID  pantoprazole , 40 mg, Oral, BID   Continuous:   heparin  25,000 units/250 mL drip 450 Units/hr (09/26/23 2130)        PRN:  lidocaine , melatonin oral/enteral/sublingual, ondansetron  injection/IVPB     Vital Signs Ins and Outs   Temp:  [36.5 ?C (97.7 ?F)-37.3 ?C (99.1 ?F)] 36.5 ?C (97.7 ?F)  Heart Rate:  [61-65] 61  Resp:  [16-23] 16  BP: (98-125)/(65-90) 98/65  NBP Mean:  [76-101] 76  SpO2:  [94 %-99 %] 96 % I/O last 2 completed shifts:  In: 642.8 [P.O.:600; I.V.:42.8]  Out: 1350 [Urine:1350]     Physical Examination  Gen: in NAD, comfortable, cooperative, conversant  HEENT: no scleral icterus  Lungs: normal work of breathing, clear to auscultation bilaterally  Heart: irregular  Abd: soft, nondistended, nontender to palpation  Ext: no lower extremity edema bilaterally  Neuro: alert, oriented, face symmetric, no dysarthria  Psych: affect appropriate to context    Labs - Last 24 hours of interval labs personally reviewed and compared to prior values.  WBC/Hgb/Hct/Plts:  6.77/8.8/28.0/215 (06/27 0439)  Na/K/Cl/CO2/BUN/Cr/glu:  143/4.1/106/27/9/0.93/98 (06/27 0439)     PT/INR/APTT/Fib:  12.4/0.9/26.3/-- (06/26 1945)    Assessment and Plan  Andrew [name] is a 68 y.o. male admitted for melena with suspicion of recurrent upper GI bleed due to PUD.  HDS.     # acute upper GI bleed likely due to PUD, POA  # acute blood loss anemia, POA   # melena, POA  #Antral Ulcer (s/p Ovesco clip 6/23)  - CTA abdomen/pelvis showing no extravasation  - s/p EGD and hemostasis with Ovesco clip in ulver of the antrum  - required 1U pRBC post procedure for Hgb drop <7  Plan  - transitioning from heparin  gtt to home eloquis once therapeutic anti Xa. Monitoring for any new signs of bleeding while transitioning back on [facility].  - GI consulted, appreciate recs  - plan for repeat EGD in 2-3 months.  - trend Hg qday  - transfuse for Hg < 7  - s/p IV PPI BID x 72hrs post procedure per GI  - will start PO PPI BID 6/27 x 2 months  - 2 large bore Ivs  - CTM BM    #abdominal/epigastric pain (Resolved)  - possibly iso of recent procedure but unlikely per GI  - Evaluated with CT/AP. No acute  pathology.     # tachy-brady syndrome s/p PPM   # pAF  # hx of bicuspid AV s/p bioprosthetic valve  - Restarting home apixaban  once therapeutic on heparin   - continue home amiodarone   - c/w home metoprolol   - continue home statin  - continue to hold aspirin   - consider cardiology c/s regarding DOAC iso recurrent GI bleeds       # AKI, resolved  - trend BMP  - avoid nephrotoxins        Inpatient Checklist  Orders Placed This Encounter      Diet regular    DVT prophylaxis:  holding home eloquis until after procedure  Central lines: none  Tubes/Drains: none    Code Status: Full Code  Contact:  Primary Emergency Contact: Herold Lanny Ip, Home Phone: [phone]    Disposition  TBD    52 minutes were spent personally by me today on this encounter which include today's pre-visit review of the chart, obtaining appropriate history, performing an evaluation, documentation and discussion of management with details supported within the note for today's visit. The time documented was exclusive of any time spent on the separately billed procedure.     Artist Mace, MD  Hospitalist - Internal Medicine  Pager: [phone]  09/27/2023 1:06 PM

## 2023-09-28 LAB — Unfractionated Heparin, by anti-Xa: HEPARIN UNFRACTIONATED: 0.11 [IU]/mL

## 2023-09-28 LAB — Basic Metabolic Panel
CREATININE: 0.93 mg/dL (ref 0.60–1.30)
TOTAL CO2: 26 mmol/L (ref 20–30)

## 2023-09-28 LAB — CBC: MEAN CORPUSCULAR VOLUME: 89.6 fL (ref 79.3–98.6)

## 2023-09-28 MED ADMIN — ATORVASTATIN CALCIUM 20 MG PO TABS: 20 mg | ORAL | @ 04:00:00 | Stop: 2023-09-29 | NDC 68084009811

## 2023-09-28 MED ADMIN — AMIODARONE HCL 200 MG PO TABS: 200 mg | ORAL | @ 16:00:00 | Stop: 2023-09-29 | NDC 60687043711

## 2023-09-28 MED ADMIN — APIXABAN 5 MG PO TABS: 5 mg | ORAL | @ 04:00:00 | Stop: 2023-09-29 | NDC 00003089431

## 2023-09-28 MED ADMIN — METOPROLOL TARTRATE 50 MG PO TABS: 50 mg | ORAL | @ 16:00:00 | Stop: 2023-09-29 | NDC 51079080101

## 2023-09-28 MED ADMIN — METOPROLOL TARTRATE 50 MG PO TABS: 50 mg | ORAL | @ 04:00:00 | Stop: 2023-09-29 | NDC 51079080101

## 2023-09-28 MED ADMIN — HEPARIN (PORCINE) IN NACL 25000-0.45 UT/250ML-% IV SOLN: 450 [IU]/h | INTRAVENOUS | @ 04:00:00 | Stop: 2023-09-28

## 2023-09-28 MED ADMIN — PANTOPRAZOLE SODIUM 40 MG PO TBEC: 40 mg | ORAL | @ 04:00:00 | Stop: 2023-09-29 | NDC 60687073609

## 2023-09-28 MED ADMIN — PANTOPRAZOLE SODIUM 40 MG PO TBEC: 40 mg | ORAL | @ 16:00:00 | Stop: 2023-09-29 | NDC 60687073609

## 2023-09-28 MED ADMIN — APIXABAN 5 MG PO TABS: 5 mg | ORAL | @ 16:00:00 | Stop: 2023-09-29 | NDC 00003089431

## 2023-09-28 NOTE — Progress Notes
 .Hospitalist Progress Note  [facility]    Patient Name Andrew [name]   Patient MRN [id number]   Patient PCP Andrew Francisco, MD   Date of Service 09/28/2023   Hospital Day 6   Admission Date 09/22/2023       Chief Complaint  Melena (Pt discharged last night after admission for pacemaker placement on 09/14/2023. His stay was complicated by melena, underwent an EGD on 09/17/2023 which showed an antral ulceration s/p hemoclips x2. Instructed to come to ED if blood in stool persisted. +melena x3 today with subsequent presyncopal symptoms. POCT Hgb 8.8, Previous Hgb (yesterday's labs 0600) 11.5)    Interval Events / Subjective / Review of Systems  6/27  - pt with no acute complaints this morning.   - Pt had a dark maroon BM (pic in chart) but remains HDS, and Hgb stable.  - updated on plan to monitor inpatient while restarting AC  - Denies fevers, chills, nausea, vomiting, chest pain, and shortness of breath.  6/28  NAEON.  Pt restarted on eliquis , will ctm for 1 more day to assess if bleeding on DOAC.  Stool with melena this AM, however subsequent stool this afternoon brown and not melena.  Wife at bedside.  Pt wishes to stay 1 more night for continued monitoring.  AF, VSS, transiently on 2 L NC but on RA during exam, saturating well.      Medications   Scheduled:  amiodarone , 200 mg, Oral, Daily  apixaban , 5 mg, Oral, BID  atorvastatin , 20 mg, Oral, Nightly  metoprolol  tartrate, 50 mg, Oral, BID  pantoprazole , 40 mg, Oral, BID   Continuous:         PRN:  lidocaine , melatonin oral/enteral/sublingual, ondansetron  injection/IVPB     Vital Signs Ins and Outs   Temp:  [36.6 ?C (97.9 ?F)-37 ?C (98.6 ?F)] 36.6 ?C (97.9 ?F)  Heart Rate:  [61-64] 62  Resp:  [16-20] 17  BP: (100-120)/(64-83) 113/73  NBP Mean:  [78-96] 90  SpO2:  [91 %-100 %] 98 % I/O last 2 completed shifts:  In: 733.5 [P.O.:720; I.V.:13.5]  Out: 1975 [Urine:1975]     Physical Examination  Gen: in NAD, comfortable, cooperative, conversant  HEENT: no scleral icterus  Lungs: normal work of breathing, clear to auscultation bilaterally  Heart: irregular  Abd: soft, nondistended, nontender to palpation  Ext: no lower extremity edema bilaterally  Neuro: alert, oriented, face symmetric, no dysarthria  Psych: affect appropriate to context    Labs - Last 24 hours of interval labs personally reviewed and compared to prior values.  WBC/Hgb/Hct/Plts:  6.59/8.8/26.7/224 (06/28 9446)  Na/K/Cl/CO2/BUN/Cr/glu:  143/4.2/106/26/10/0.93/91 (06/28 9446)          Assessment and Plan  Andrew [name] is a 68 y.o. male admitted for melena with suspicion of recurrent upper GI bleed due to PUD.  HDS.     # acute upper GI bleed likely due to PUD, POA  # acute blood loss anemia, POA   # melena, POA  #Antral Ulcer (s/p Ovesco clip 6/23)  - CTA abdomen/pelvis showing no extravasation  - s/p EGD and hemostasis with Ovesco clip in ulver of the antrum  - required 1U pRBC post procedure for Hgb drop <7  Plan  - transitioned from heparin  gtt to home eloquis once therapeutic anti Xa. Monitoring for any new signs of bleeding while transitioning back on [facility].  - GI consulted, appreciate recs  - plan for repeat EGD in 2-3 months.  - trend Hg qday  -  transfuse for Hg < 7  - s/p IV PPI BID x 72hrs post procedure per GI  - will start PO PPI BID 6/27 x 2 months  - 2 large bore Ivs  - CTM BM    #abdominal/epigastric pain (Resolved)  - possibly iso of recent procedure but unlikely per GI  - Evaluated with CT/AP. No acute pathology.     # tachy-brady syndrome s/p PPM   # pAF  # hx of bicuspid AV s/p bioprosthetic valve  - Restarting home apixaban  once therapeutic on heparin   - continue home amiodarone   - c/w home metoprolol   - continue home statin  - continue to hold aspirin   - consider cardiology c/s regarding DOAC iso recurrent GI bleeds       # AKI, resolved  - trend BMP  - avoid nephrotoxins        Inpatient Checklist  Orders Placed This Encounter      Diet regular    DVT prophylaxis:  holding home eloquis until after procedure  Central lines: none  Tubes/Drains: none    Code Status: Full Code  Contact:  Primary Emergency Contact: Andrew [name], Home Phone: [phone]    Disposition  TBD    65 minutes were spent personally by me today on this encounter which include today's pre-visit review of the chart, obtaining appropriate history, performing an evaluation, documentation and discussion of management with details supported within the note for today's visit. The time documented was exclusive of any time spent on the separately billed procedure.     Signed:  Lauraine Lenz, MD MS  [facility] Service  Pager [phone]

## 2023-09-28 NOTE — Other
 [name] Clinical Goal:   Clinical Goal(s) for the Shift: VSS, no e/o bleeding, Normal BM  Identify possible barriers to advancing the care plan:   Stability of the patient: Moderately Stable - low risk of patient condition declining or worsening   Progression of [name] Clinical Goal:   Neuro: Within Defined Limits   BMAT: Level 3 - Yellow  Assistive Device:      Vitals:    Temp:  [36.6 ?C (97.9 ?F)-37 ?C (98.6 ?F)] 36.6 ?C (97.9 ?F)  Heart Rate:  [61-64] 63  Resp:  [16-19] 17  BP: (100-120)/(66-87) 107/87  NBP Mean:  [78-96] 93  SpO2:  [91 %-100 %] 98 %  Cardiac:  Atrial paced  Ectopy:   Respiratory:  None (Room air)        Pain:    Last BM: 09/27/23  GU: Voiding  Skin: Intact except incision  Lines/Tubes/Drains:   Peripheral IV 20 G Posterior;Right Wrist (6)  Peripheral IV 20 G Left;Posterior Hand (5)    Shift Events/Acute Changes:   No e/o bleeding  BM x 3. Brown formed BM  Left chest PPM incision-edges approximated    To Do/Pending:  Montitor for melena  Monitor H/H     Nursing Considerations for Discharge:       Expected Discharge Disposition: Home w/Home Health     Expected Discharge Date: 09/29/2023

## 2023-09-28 NOTE — Consults
 IP CM ACTIVE DISCHARGE PLANNING  Department of Care Coordination      Admit [phone]  Anticipated Date of Discharge: 09/29/2023    Following FI:Gjrxdnw, Evan G., MD      Today's short update     No change  Medically active  Disposition  TBD    Disposition     Home w/Home Health  Other (Comment), Nursing [facility])  [address],   [place] [address]  Family/Support System in agreement with the current discharge plan: Yes, in agreement and participating       Multidisciplinary Team Member Plan of Care   Interdisciplinary rounds were conducted with the multidisciplinary team including the clinical social worker and nurse case manager. The patient's plan of care and discharge plan were discussed and formulated based on the patient's specific needs.    Home Health Coordination Status (if applicable)     Resumption of care    DME or RT Equipment Status (if applicable)     Authorization in progress (4/7)      Non-medical Transportation Arrangement Status (if applicable)     Patient/family secured                 Exzavier Ruderman Kenney BSN, RN  Tel # [phone]  Clinical Case Manager (Weekend  Inpatient Coverage)  Dept. of Care Coordination & Clinical Social Work

## 2023-09-28 NOTE — Other
 [name] Clinical Goal:   Clinical Goal(s) for the Shift: VSS, pt comfort, safety  Identify possible barriers to advancing the care plan:   Stability of the patient: Moderately Stable - low risk of patient condition declining or worsening   Progression of [name] Clinical Goal: Neuro: Within Defined Limits   BMAT: Level 3 - Yellow  Assistive Device:      Vitals:    Temp:  [36.5 ?C (97.7 ?F)-37 ?C (98.6 ?F)] 36.9 ?C (98.4 ?F)  Heart Rate:  [61-64] 61  Resp:  [16-20] 16  BP: (98-120)/(64-88) 120/83  NBP Mean:  [76-96] 96  SpO2:  [91 %-100 %] 100 %  Cardiac:  Atrial paced  Ectopy:   Respiratory:  Nasal cannula     2 L/min  Pain:    Last BM: 09/27/23  GU: Voiding  Skin: Intact except incision (left chest incision C/D/I)  Lines/Tubes/Drains:   Peripheral IV 20 G Posterior;Right Wrist (6)  Peripheral IV 20 G Left;Posterior Hand (5)       Shift Events/Acute Changes:   Heparin  gtt discontinued. Apixaban  5 mg started.  No s/s of bleeding     To Do/Pending:  Monitor H/H  Monitor for melena     Nursing Considerations for Discharge:       Expected Discharge Disposition: Home w/Home Health     Expected Discharge Date: 09/29/2023

## 2023-09-29 LAB — CBC: PLATELET COUNT, AUTO: 241 x10E3/uL (ref 143–398)

## 2023-09-29 LAB — Basic Metabolic Panel
ANION GAP: 9 mmol/L (ref 8–19)
ESTIMATED GFR 2021 CKD-EPI: 86 mL/min/1.73m2 (ref 7–22)

## 2023-09-29 MED ORDER — APIXABAN 5 MG PO TABS
5 mg | ORAL_TABLET | Freq: Two times a day (BID) | ORAL | 3 refills | 30.00000 days | Status: AC
Start: 2023-09-29 — End: ?

## 2023-09-29 MED ORDER — AMIODARONE HCL 200 MG PO TABS
200 mg | ORAL_TABLET | Freq: Every day | ORAL | 0 refills | 30.00000 days | Status: AC
Start: 2023-09-29 — End: ?

## 2023-09-29 MED ADMIN — APIXABAN 5 MG PO TABS: 5 mg | ORAL | @ 03:00:00 | Stop: 2023-09-29 | NDC 00003089431

## 2023-09-29 MED ADMIN — PANTOPRAZOLE SODIUM 40 MG PO TBEC: 40 mg | ORAL | @ 03:00:00 | Stop: 2023-09-29 | NDC 60687073609

## 2023-09-29 MED ADMIN — AMIODARONE HCL 200 MG PO TABS: 200 mg | ORAL | @ 16:00:00 | Stop: 2023-09-29 | NDC 60687043711

## 2023-09-29 MED ADMIN — ATORVASTATIN CALCIUM 20 MG PO TABS: 20 mg | ORAL | @ 03:00:00 | Stop: 2023-09-29 | NDC 68084009811

## 2023-09-29 MED ADMIN — METOPROLOL TARTRATE 50 MG PO TABS: 50 mg | ORAL | @ 16:00:00 | Stop: 2023-09-29 | NDC 51079080101

## 2023-09-29 MED ADMIN — METOPROLOL TARTRATE 50 MG PO TABS: 50 mg | ORAL | @ 03:00:00 | Stop: 2023-09-29

## 2023-09-29 MED ADMIN — PANTOPRAZOLE SODIUM 40 MG PO TBEC: 40 mg | ORAL | @ 16:00:00 | Stop: 2023-09-29 | NDC 60687073609

## 2023-09-29 MED ADMIN — APIXABAN 5 MG PO TABS: 5 mg | ORAL | @ 16:00:00 | Stop: 2023-09-29 | NDC 00003089431

## 2023-09-29 NOTE — Other
 [name] Clinical Goal:   Clinical Goal(s) for the Shift: VSS, pt comfort and safety    Identify possible barriers to advancing the care plan: None noted at this time    Stability of the patient: Moderately Stable - low risk of patient condition declining or worsening     Progression of [name] Clinical Goal: Pt A&Ox4, without complaints of pain this shift. Pt's Hg stable at 8.6 this morning, VSS.    Temp 97.2 ~ HR 62 ~ BP 122/76 ~ RR 20 ~ O2 95% (room air)    Assessment completed and medications administered per MAR. Encouraged pt to verbalize needs and concerns, provided assistance with ambulation and ADL's as needed. Pt with x1 BM this shift - stool brown, without signs of bleed.     Pt currently up to chair without complaints, pt's wife at bedside. Will CTM and notify MD regarding any significant changes in pt status.   ___________________________    1100: MD with order to discharge pt. All discharge paperwork discussed with pt and pt's wife at bedside. PIV x2 removed.    Pt A&Ox4, without complaints at time of discharge, VSS. Pt left 5E with all belongings at approx 1150 accompanied by wife and transport.

## 2023-09-29 NOTE — Other
 [name] Clinical Goal:   Clinical Goal(s) for the Shift: VSS,  pt comfort, safety  Identify possible barriers to advancing the care plan:   Stability of the patient: Moderately Stable - low risk of patient condition declining or worsening   Progression of [name] Clinical Goal: Neuro: Within Defined Limits   BMAT: Level 3 - Yellow  Assistive Device: Walker    Vitals:    Temp:  [36.6 ?C (97.9 ?F)-37 ?C (98.6 ?F)] 36.7 ?C (98.1 ?F)  Heart Rate:  [62-69] 68  Resp:  [17-22] 18  BP: (95-120)/(66-87) 110/77  NBP Mean:  [78-93] 88  SpO2:  [96 %-100 %] 96 %  Cardiac:  Atrial paced  Ectopy:   Respiratory:  None (Room air)        Pain:    Last BM: 09/28/23  GU: Voiding  Skin: Intact except incision  Lines/Tubes/Drains:   Peripheral IV 20 G Posterior;Right Wrist (7)  Peripheral IV 20 G Left;Posterior Hand (6)       Shift Events/Acute Changes:   No e/o bleeding  PO apixaban  continued     To Do/Pending:  DC home with [facility]     Nursing Considerations for Discharge:       Expected Discharge Disposition: Home w/Home Health     Expected Discharge Date: 09/29/2023

## 2023-09-29 NOTE — Progress Notes
 Pharmaceutical Services - Discharge Medication Reconciliation Note     Patient Name: Andrew [name]  Medical Record Number: [id number]  Admit date: 09/22/2023 6:48 PM    Age: 69 y.o.  Sex: male  Allergies: Patient has no known allergies.  Height:   Most recent documented height   08/09/23 1.676 m (5' 6'')     Actual Weight:   Most recent documented weight   09/29/23 89 kg   09/20/23 86.3 kg     Diagnosis: The patient is currently admitted with the following concerns/issues: Active Problems:    * No active hospital problems. *      Discharge Medication List:     Changes To My Medications        CHANGE how you take these medications        Dose Instructions Notes   amiodarone  200 mg tablet  Start taking on: September 30, 2023  What changed: See the new instructions.   200 mg   Take 1 tablet (200 mg total) by mouth daily.             CONTINUE taking these medications        Dose Instructions Notes   allopurinol  100 mg tablet  Commonly known as: Zyloprim    200 mg   TAKE 2 TABLETS BY MOUTH EVERY DAY      amoxicillin 500 mg capsule    Use as directed prior to dental appointment      apixaban  5 mg tablet  Commonly known as: Eliquis    5 mg   Take 1 tablet (5 mg total) by mouth two (2) times daily.      ascorbic acid  500 mg tablet   500 mg   Take 1 tablet (500 mg total) by mouth daily.      atorvastatin  20 mg tablet  Commonly known as: Lipitor   20 mg   TAKE 1 TABLET BY MOUTH EVERYDAY AT BEDTIME      CALCIUM  PO   600 mg   Take 600 mg by mouth two (2) times daily.      HCA SUPER THERAVITE-M PO   1 tablet   Take 1 tablet by mouth daily.      metoprolol  tartrate 50 mg tablet  Commonly known as: Lopressor    50 mg   Take 1 tablet (50 mg total) by mouth two (2) times daily.      pantoprazole  40 mg DR tablet  Commonly known as: Protonix    40 mg   Take 1 tablet (40 mg total) by mouth two (2) times daily.      vitamin D (cholecalciferol ) 25 mcg (1000 units) tablet   25 mcg   Take 1 tablet (25 mcg total) by mouth daily.             STOP taking these medications        STOP taking these medications   aspirin  81 mg EC tablet                Prescriptions        These medications were sent to [id number] - [place], [place] - [address]  [address], [place] [address]      Phone: [phone]   amiodarone  200 mg tablet  apixaban  5 mg tablet         Discharge Prescription Preference:   [id number] - [place], [place] - [address]  [address]  [place] [address]        The reconciliation of discharge orders with inpatient orders and PTA med list is complete. There are no issues requiring follow up at this time.     The following medications were counseled to the spouse:   1. Apixaban   2. Amiodarone      The purpose, potential side effects, storage, missed doses and any special instructions related to each medication was discussed.  Medication Delivery  Discharge prescriptions were sent to the patient's preferred pharmacy.    Suzen Serum Vy Defne Gerling, PharmD, 09/29/2023, 11:29 AM

## 2023-09-29 NOTE — Consults
 FINAL DISCHARGE MULTIDISCIPLINARY NOTE  Department of Care Coordination      Admit Ijuz:[id number]  Anticipated Date of Discharge: 09/29/2023    Following MD:No att. providers found    Home Address:[address]  [place] [address]      DISCHARGE INFORMATION:     Discharge Address: [address], [place] [address]    Individual(s) notified of discharge plan:  Contact Name: Tzvi Economou Relationship: Spouse   Contact Number(s): [phone]      Is patient/family informed of discharge?: Yes Is patient/family agreeable of discharge destination?: Yes     Support Systems: Spouse       Medicare Important Message Provided: Not Applicable  Medicare Outpatient Observation Notice (MOON) Provided: Not Applicable      Final Discharge Needs: Home Health Coordination      Freedom of Choice        Facility Transfer/Placement (if applicable):        Home Health Coordination (if applicable):        [facility]  [address]  [place], [place] [address]  Phone: [phone]  Fax: [phone]    Home Infusion Coordination (if applicable):        Hospice (if applicable):        Home Durable Medical and/or Respiratory Equipment (if applicable):        Hemodialysis (if applicable):        Homeless Discharge (if applicable):   Primary Living Situation: Lives w/Capable Spouse    Transportation Arrangements (if applicable):        Other Arrangements (if applicable):        PASRR        SDOH   Social Drivers of Health (SDOH)  Within the past 12 months, has a lack of transportation kept you from medical appointments, meetings, work, or from getting things needed for daily living? : No  How hard is it for you to pay for the very basics like food, housing, medical care, and heating?: Somewhat hard  How hard is it for you to pay for prescriptions or medical bills?: Very hard  In the past 12 months has the electric, gas, oil, or water company threatened to shut off services in your home?: No  In the last 12 months, was there a time when you were not able to pay the mortgage or rent on time?: No  In the last 12 months, how many places have you lived?: 2  In the last 12 months, was there a time when you did not have a steady place to sleep or slept in a shelter (including now)?: No    Covering CM  Alfonso Coe BSN RN CCM    Clinical Case Manager  [address]  [place], [place] [address]  Pager: [phone]  [email or web address]

## 2023-09-30 ENCOUNTER — Telehealth: Payer: Commercial Managed Care - Pharmacy Benefit Manager

## 2023-09-30 DIAGNOSIS — R1010 Upper abdominal pain, unspecified: Secondary | ICD-10-CM

## 2023-09-30 DIAGNOSIS — K250 Acute gastric ulcer with hemorrhage: Secondary | ICD-10-CM

## 2023-09-30 NOTE — Telephone Encounter
 Discharge Followup Appointment Info      Date of Discharge: 09/29/2023    Discharge Disposition: Home or Self Care   TIme Frame: Within 7 Days    Sched with PCP?: [name] [place] PCP PCP Name: Jeana Francisco, MD   Date and Time of appt: 10/02/23 11:00 AM PDT    Was Appt Confirmed?: Pos    Method of Appt Confirmation: Confirmed via phone directly with patient/family    Barriers to Scheduling: None    Did you schedule any Specialty visits?: No    Additional Appts/Tests/Info Future Appointments  10/02/2023   11:00 AM   Audrey Maple HERO., MD   INT MED 420         [place]/Cen  10/09/2023   3:00 PM    Vucicevic, Darko, MD       CRD CSCS 630        [place]/Cen (previously scheduled)            Confirmed PCP & Cardiology appt with PT at [phone]    Is the patient being discharged to SNF? No  Clinic/Test/Procedure to be scheduled:cardiology  Doctor Preferences (If applicable): any  What studies should be completed prior to follow up (if applicable): NA  Time Frame: 1-2 weeks  Reason for Appointment/Procedure: post hospital discharge follow up  Is the patient being discharged to SNF? No  Clinic/Test/Procedure to be scheduled: PCP  Doctor Preferences (If applicable): any  What studies should be completed prior to follow up (if applicable): NA  Time Frame: 1-2 weeks  Reason for Appointment/Procedure: post hospital discharge follow up

## 2023-09-30 NOTE — Progress Notes
 Physical Therapy Discharge Note    PATIENT: Andrew [name]  MRN: [id number]  DOB: 24-Jul-1955      Date:  09/30/2023   Therapist: Beverley Printers, PT      First Filed Value Last Filed Value   Bed Mobility   Supine to Sit: Not Performed (pt rec'd sitting up in chair) (09/24/23 1645)  Sit to Supine: Not Performed (pt left sitting up in chair) (09/24/23 1645) Supine to Sit: Moderate Assist;Verbal Cueing (09/26/23 0947)  Sit to Supine: Moderate Assist;Verbal Cueing (09/26/23 0947)   Transfers   Sit to Stand: Maximum Assist;Assistive Device (Comment) (max A became mod/min A w/ subsequent reps w/ FWW) (09/24/23 1645)  Transfer(s) Performed: 3 (09/26/23 0947)  Transfer #1: From;Bed;To;Toilet (09/26/23 0947)  Transfer #1 Level of Assist: Moderate Assist;Verbal Cueing (09/26/23 0947)  Transfer #1 Type: Stand step (09/26/23 0947)  Transfer #1 Asst Device: Front wheeled walker (09/26/23 0947) Sit to Stand: Moderate Assist;Verbal Cueing (09/26/23 0947)  Transfer(s) Performed: 3 (09/26/23 0947)  Transfer #1: From;Bed;To;Toilet (09/26/23 0947)  Transfer #1 Level of Assist: Moderate Assist;Verbal Cueing (09/26/23 0947)  Transfer #1 Type: Stand step (09/26/23 0947)  Transfer #1 Asst Device: Front wheeled walker (09/26/23 0947)   Ambulation Ambulation: Minimum Assist (09/24/23 1645)  Ambulation Distance (Feet): 30ft (09/24/23 1645) Ambulation: Moderate Assist (09/26/23 0947)  Ambulation Distance (Feet): 26ft+12ft (09/26/23 9052)   Assistive device Assistive Device: Front wheeled walker (09/24/23 1645) Assistive Device: Front wheeled walker (09/26/23 0947)   Stairs          AM-PAC Basic Mobility Raw Score: AM-PAC Basic Mobility Raw Score: 13 (09/26/23 0957)    Goals  Short Term Goals  Short Term Goals to be achieved in: 7 days (09/24/23 1704)  Pt will perform bed mobility: with stand by assist (09/24/23 1704)  Pt will perform sit to stand: with FWW;with contact guard assist (09/24/23 1704)  Pt will transfer to/from bed/chair: with FWW;with contact guard assist (09/24/23 1704)  Pt will ambulate: 51-100 feet;with FWW;with contact guard assist (09/24/23 1704)    Goals met: No: Reason Goal(s) Not Met: Patient discharged    Discharge   Discharge Recommendation: Physical Therapy, Would benefit from continued therapy   Discharge concerns: Requires assistance for mobility, Requires assistance for self care   Discharge Equipment Recommended: Defer to discharge facility, If patient dc home instead of rehab as recommended, Owns recommended DME     [x]   Reviewed treatment plans and goals with PTA     See notes and education record for therapy education documentation    Comments:     Beverley Printers, PT

## 2023-10-01 NOTE — Discharge Summary
 Discharge Summary   Name: Andrew [name] MRN: [id number] DOB: [birth year]   Admit Date: 09/22/2023 D/C Date: 09/29/2023    LOS:   7 days    Admit Attending: Thedora CANDIE Mattock, MD Discharge Attending: Leonce Artist MATSU., MD    PCP: Jeana Francisco, MD Discharge Provider: Lauraine FREDRIK Lenz, MD     Inpatient Care Team/ Consults:  Treatment Team:   Team: RR DIRECT CARE HOSPITALIST  Team: MEDICINE - HOSPITALIST-2  GI Outpatient Care Team  No care team member to display     Admission Diagnosis:  (reason for admission)  Black stool Discharge Diagnosis:  (conditions treated during hospitalization)  Upper GI bleed   Disposition:  Home or Self care      Discharge Condition:  stable       HPI:   Andrew [name] is a 68 y.o. 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  Patient discharged from this hospital on 6/21 (admitted 6/9-21), found to have tachy-brady syndrome and received pacemaker on 6/14, hospital course c/b melena and found to have antral ulceration s/p 2 hemoclips on 6/17.  His Hg was stable around 11 (baseline 14-15) and he was discharged on 6/21 on apixaban .  He was doing well after discharge but this AM had multiple episodes of black tarry stool as well as lightheadedness, prompting this return ED visit.  Denies chest pain or pressure, SOB, edema, palpitations, nausea, vomiting, urinary symptoms.     ER course:   Vitals stable.  Labs notable for Hg 8, down 3 from discharge.  Cr elevated, BUN elevated.  GI consulted.  PPI drip started.  CTA abd/pelvis performed, not read yet.  Admitted to medicine for further evaluation and treatment.    Hospital Course:    CTA abdomen/pelvis showing no extravasation.  s/p EGD with GI and hemostasis with Ovesco clip in ulver of the antrum, required 1U pRBC post procedure for Hgb drop <7.  Transitioned back to home dose eliquis , without e/o bleed.  Melena resolved to brown stool prior to discharge.  On day of discharge pt is HDS and ready for dispo to home.       Outpatient Provider To-Do List  F/u with PCP  F/u with medication adherence         LACE+ Score: 60 (09/29/23 1157) High Risk of Readmission  Better Outcomes by Optimizing Safe Transitions - GERIATRIC CONSIDERATIONS     This patient meets criteria for the following BOOST risk conditions which could lead to readmission. Please consider further evaluation or action on the potential barriers below:    Problems with Medications:      11 scheduled medications active at discharge  High-risk active medications: apixaban  5 mg tablet [[id number]]      Patient Support:     Home Health Ordered: No.          Final Discharge CM Note Written: 09/29/2023  Contact Name: Alder Murri Spouse [phone]  Support Systems: Spouse          Physical Limitation:       [place] Fall Risk Screen: High risk for falls    Disposition:    PT Concerns: Requires assistance for mobility, Requires assistance for self care  Pt Recommends: Defer to discharge facility, If patient dc home instead of rehab as recommended, Owns recommended DME,  Physical Therapy, Would benefit from continued therapy         Pending Labs   The following tests have been collected, but not yet resulted at the time of discharge.    Unresulted Labs (From admission, onward)      None          The following labs have a preliminary result at the time of discharge.  Please check back if the final results have changed.  Preliminary Resulted Labs (From admission, onward)      None          Discharge Medication List  Coumadin and Opiate Risk Assessment   This patient does not have coumadin on their discharge med list  No controlled substances on discharge med list      Changes To My Medications      CHANGE how you take these medications     amiodarone  200 mg tablet; Dose: 200 mg; Take 1 tablet (200 mg total) by   mouth daily.; Quantity: 30 tablet; Refills: 0; What changed: See the new   instructions.     CONTINUE taking these medications     allopurinol  100 mg tablet; Dose: 200 mg; TAKE 2 TABLETS BY MOUTH EVERY   DAY; Quantity: 180 tablet; Refills: 1; Commonly known as: Zyloprim    amoxicillin 500 mg capsule; Use as directed prior to dental appointment;   Refills: 0   apixaban  5 mg tablet; Dose: 5 mg; Take 1 tablet (5 mg total) by mouth   two (2) times daily.; Quantity: 180 tablet; Refills: 3; Commonly known as:   Eliquis    ascorbic acid  500 mg tablet; Dose: 500 mg; Take 1 tablet (500 mg total)   by mouth daily.; Refills: 0   atorvastatin  20 mg tablet; Dose: 20 mg; TAKE 1 TABLET BY MOUTH EVERYDAY   AT BEDTIME; Quantity: 90 tablet; Refills: 3; Commonly known as: Lipitor   CALCIUM  PO; Dose: 600 mg; Take 600 mg by mouth two (2) times daily.;   Refills: 0   HCA SUPER THERAVITE-M PO; Dose: 1 tablet; Take 1 tablet by mouth daily.;   Refills: 0   metoprolol  tartrate 50 mg tablet; Dose: 50 mg; Take 1 tablet (50 mg   total) by mouth two (2) times daily.; Quantity: 60 tablet; Refills: 0;   Commonly known as: Lopressor    pantoprazole  40 mg DR tablet; Dose: 40 mg; Take 1 tablet (40 mg total)   by mouth two (2) times daily.; Quantity: 60 tablet; Refills: 0; Commonly   known as: Protonix    vitamin D (cholecalciferol ) 25 mcg (1000 units) tablet; Dose: 25 mcg;   Take 1 tablet (25 mcg total) by mouth daily.; Refills: 0     STOP taking these medications     aspirin  81 mg EC tablet      Requested Appointments   We will schedule and contact you with follow up appointment(s)   As   directed      Is the patient being discharged to SNF? No  Clinic/Test/Procedure to be scheduled:cardiology  Doctor Preferences (If applicable): any  What studies should be completed prior to follow up (if applicable): NA  Time Frame: 1-2 weeks  Reason for Appointment/Procedure: post hospital discharge follow up    Is the patient being discharged to SNF? No  Clinic/Test/Procedure to be scheduled: PCP  Doctor Preferences (If applicable): any  What studies should be completed prior to follow up (if applicable): NA  Time Frame: 1-2 weeks  Reason for Appointment/Procedure: post hospital  discharge follow up        Scheduled Appointments  Future Appointments   Date Time Provider Department Center   10/02/2023 11:00 AM Audrey Maple HERO., MD INT MED 420 [place]/Cen   10/09/2023  3:00 PM Di Cater, MD CRD [facility] 630 [place]/Cen   10/14/2023 10:00 AM Tobie Elston NOVAK., MD Gastro [address] [place]/Cen   10/16/2023  1:30 PM ARS, MP1 CRD DEVICE [place]/Cen   10/16/2023  2:00 PM Myrle Fines, MD CRD EP 690 [place]/Cen   10/24/2023  3:15 PM Jeana Francisco, MD IMRES [phone] [place]/Cen   10/29/2023  8:00 AM SLEEP FE[facility] /Cen          Discharge Physical Exam/Labs/Imaging/Procedures     Discharge Physical Exam:   BP 104/74  ~ Pulse 61  ~ Temp 36.7 ?C (98.1 ?F) (Oral)  ~ Resp 20  ~ Wt 89 kg (196 lb 3.4 oz)  ~ SpO2 95%  ~ BMI 31.67 kg/m?   General appearance: alert, appears stated age, and cooperative  Neck: no adenopathy, no carotid bruit, no JVD, supple, symmetrical, trachea midline, and thyroid not enlarged, symmetric, no tenderness/mass/nodules  Lungs: clear to auscultation bilaterally  Heart: regular rate and rhythm, S1, S2 normal, no murmur, click, rub or gallop  Abdomen: soft, non-tender; bowel sounds normal; no masses,  no organomegaly  Extremities: extremities normal, atraumatic, no cyanosis or edema  Skin: Skin color, texture, turgor normal. No rashes or lesions  Neurologic: Grossly normal     Relevant labs:     CBC:  Lab Results   Component Value Date    WBC 6.29 09/29/2023    HGB 8.6 (L) 09/29/2023    HCT 26.7 (L) 09/29/2023    PLT 241 09/29/2023     LFT:  No results found for: ''TOTPRO'', ''ALBUMIN'', ''BILITOT'', ''ALKPHOS'', ''AST'', ''ALT''    BMP:  Lab Results   Component Value Date    NA 140 09/29/2023    K 4.2 09/29/2023    CL 106 09/29/2023    CO2 25 09/29/2023    BUN 12 09/29/2023    CREAT 0.96 09/29/2023    CALCIUM  8.7 09/29/2023    GLUCOSE 98 09/29/2023       Cal/iCal/MG/Ph:  Lab Results   Component Value Date    CALCIUM  8.7 09/29/2023    MG 1.7 09/26/2023     Coags:  Lab Results   Component Value Date    PT 12.4 09/26/2023    APTT 26.3 09/26/2023    INR 0.9 09/26/2023             Last BNP: No results found for: ''BNP''       Relevant Imaging Studies (most recent results only):   Last CXR: No CXRs in the last 180 days             Nutrition Recommendations & Malnutrition Assessment   I have seen and examined the patient and agree with the RD assessment and plan. See RD note from this hospital admission for additional details.               Discharge Diet Orders: There are no active discharge diet orders for this encounter.   Skin/Wound         Discharge Activity Orders   There are no active discharge activity orders for this encounter.   Rehab Assessment   I have seen and examined the patient and agree with the PT assessment detailed below:  Inpatient Recommendation: PT treatment (09/24/23 1701)  Recommendation  Discharge Recommendation: Physical Therapy;Would benefit from continued therapy (09/26/23 0957)  Discharge concerns: Requires assistance for mobility;Requires assistance for self care (09/26/23 0957)  Discharge Equipment Recommended: Defer to discharge facility;If patient dc home instead of rehab as recommended;Owns recommended DME (09/26/23 0957)  Present During Evaluation: Other reduced mobility (Z74.09)      OT evaluation and discharge - no therapy needed      Case Manager/Home Health Assessment   Discharge Information  Discharge Address: [address] [place] [address] [phone]/29/25 1342)  Contact Name: Alazar Cherian (09/29/23 1342)  Relationship: Spouse (09/29/23 1342)  Contact Number(s): [phone] (09/29/23 1342)  Is patient/family informed of discharge?: Yes (09/29/23 1342)  Is patient/family agreeable of discharge destination?: Yes (09/29/23 1342)  Support Systems: Spouse (09/29/23 1342)  Medicare Important Message Provided: Not Applicable (09/29/23 1342)  Medicare Outpatient Observation Notice (MOON) Provided: Not Applicable (09/29/23 1342)  Final Discharge Needs: Home Health Coordination (09/29/23 1342)              Home Health Orders  There are no active discharge home health orders for this encounter.   Goals of Care Note (if new for this encounter)  No notes of this type exist for this encounter.        I have seen and examined the patient on their discharge day. I have reviewed, edited, and agree with the above discharge summary. More than 30 minutes was personally spent on discharge planning on the day of discharge in coordination of care, counseling the patient and preparation of discharge.  Lauraine FREDRIK Lenz, MD  10/01/2023 3:57 PM

## 2023-10-02 ENCOUNTER — Ambulatory Visit: Payer: Commercial Managed Care - Pharmacy Benefit Manager

## 2023-10-02 DIAGNOSIS — K922 Gastrointestinal hemorrhage, unspecified: Principal | ICD-10-CM

## 2023-10-02 DIAGNOSIS — D649 Anemia, unspecified: Secondary | ICD-10-CM

## 2023-10-02 NOTE — Progress Notes
 PATIENT: Andrew [name]  MRN: [id number]  DOB: [birth year]  DATE OF SERVICE: 10/02/2023    CHIEF COMPLAINT:   Chief Complaint   Patient presents with    [facility] Discharge Follow Up       Andrew [name] is a 68 y.o. male who was in hospital 6/22 to 6/29 after being discharged 06/21 after admission on 06/09.  His 1st admission was for tachy-brady syndrome any received a pacemaker and hospital course was complicated by melena and found to have antral ulceration status post 2 hemoclips on 06/17.  His hemoglobin was stable around 11 and he was discharged on 06/21 on apixaban .  He was reportedly doing well after his discharge but the following morning he had black tarry stools and lightheadedness. .  Hemoglobin was down to 8 and creatinine was elevated.  He underwent EGD and had hemostasis of the [place] clip in the antrum and required 1 unit of packed RBCs.  Melena resolved  Stopped aspirin  and on pantoprazole . No blood being seen by patients since and he feels overall well.    Past Medical History:   Diagnosis Date    Antral ulcer 09/2023    CVA (cerebral vascular accident) (HCC/RAF)     H/O aortic valve replacement     Kidney stone     Urinary frequency        ACTIVE PROBLEMS:  Patient Active Problem List    Diagnosis Date Noted    H/O bicuspid aortic valve 08/09/2023    H/O: CVA (cerebrovascular accident) 08/09/2023    Age related osteoporosis 07/20/2022    Prediabetes 07/20/2022    Bicuspid aortic valve 06/09/2020    Urinary frequency     H/O aortic valve replacement     Hypertension 03/11/2012    Gout 03/11/2012    Seborrheic dermatitis 03/11/2012    Hx of medication noncompliance 01/11/2012    Muscle weakness (generalized) 01/11/2012    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    apixaban  5 mg tablet Take 1 tablet (5 mg total) by mouth two (2) times daily.    ascorbic acid  500 mg tablet Take 1 tablet (500 mg total) by mouth daily.    ATORVASTATIN  20 mg tablet TAKE 1 TABLET BY MOUTH EVERYDAY AT BEDTIME    CALCIUM  PO Take 600 mg by mouth two (2) times daily.    metoprolol  tartrate 50 mg tablet Take 1 tablet (50 mg total) by mouth two (2) times daily.    Multiple Vitamins-Minerals (HCA SUPER THERAVITE-M PO) Take 1 tablet by mouth daily.    pantoprazole  40 mg DR tablet Take 1 tablet (40 mg total) by mouth two (2) times daily.    vitamin D, cholecalciferol , 25 mcg (1000 units) tablet Take 1 tablet (25 mcg total) by mouth daily.       Social History     Socioeconomic History    Marital status: Married   Tobacco Use    Smoking status: Former     Current packs/day: 0.00     Average packs/day: 1 pack/day for 15.0 years (15.0 ttl pk-yrs)     Types: Cigarettes     Start date: 05/03/1975  Quit date: 05/02/1990     Years since quitting: 33.4    Smokeless tobacco: Never   Substance and Sexual Activity    Alcohol use: Not Currently     Comment: occasionally has red wine    Drug use: Not Currently     Social Drivers of Health     Financial Resource Strain: Medium Risk (09/23/2023)    Financial Resource Strain     Difficulty of Paying Living Expenses: Somewhat hard       Family History   Problem Relation Age of Onset    No Known Problems Mother     Other (gastric ulcer) Father     Uterine cancer Sister     Stroke Brother     Kidney failure Brother     Colon cancer Neg Hx     Lung cancer Neg Hx     Prostate cancer Neg Hx        ALLERGIES:   No Known Allergies    PHYSICAL EXAM:  Wt Readings from Last 3 Encounters:   10/02/23 190 lb (86.2 kg)   09/29/23 196 lb 3.4 oz (89 kg)   09/20/23 190 lb 4.1 oz (86.3 kg)       VITAL SIGNS:   Last Recorded Vital Signs:    10/02/23 1119   BP: 127/85   Pulse: 71   Resp: 17   Temp: 36.5 ?C (97.7 ?F)   SpO2: 94% Vitals:    10/02/23 1119   Weight: 190 lb (86.2 kg)   Height: 5' 6'' (1.676 m)     General Appearance:  Alert, cooperative, no distress, appears well-developed,  well-nourished    RR 14    LABS:  Results for orders placed or performed in visit on 05/25/23   Lipid Panel   Result Value Ref Range    Cholesterol 123 See Comment mg/dL    Cholesterol,LDL,Calc 68 <100 mg/dL    Cholesterol, HDL 38 (L) >40 mg/dL    Triglycerides 84 <849 mg/dL    Non-HDL,Chol,Calc 85 <130 mg/dL     Results for orders placed or performed during the hospital encounter of 09/22/23   Basic Metabolic Panel   Result Value Ref Range    Sodium 140 135 - 146 mmol/L    Potassium 4.2 3.6 - 5.3 mmol/L    Chloride 106 96 - 106 mmol/L    Total CO2 25 20 - 30 mmol/L    Anion Gap 9 8 - 19 mmol/L    Glucose 98 65 - 99 mg/dL    Creatinine 9.03 9.39 - 1.30 mg/dL    Estimated GFR 86 See GFR Additional Information mL/min/1.68m2    GFR Additional Information See Comment     Urea Nitrogen 12 7 - 22 mg/dL    Calcium  8.7 8.6 - 10.4 mg/dL     Lab Results   Component Value Date    HGBA1C 6.4 (H) 05/25/2023         ASSESSMENT AND PLAN:    All of the above medical problems have been reviewed with the patient and are stable except as noted otherwise below:  Status post upper GI bleed seen by GI and had upper endoscopy and hemostasis.  Hemoglobin is stable at time of discharge although still low.  He has follow up with Gastroenterology in a couple of weeks.  He is holding aspirin  per their instructions and is on pantoprazole  but continues on Eliquis .  Kidney function is normal  Diagnoses and all orders for this visit:  Upper GI bleed    Anemia, unspecified type        The above recommendations were discussed with the patient.  The patient has all questions answered satisfactorily and is in agreement with this recommended plan of care.      Author:  Maple HERO. Audrey, MD 10/02/2023 12:42 PM

## 2023-10-09 ENCOUNTER — Ambulatory Visit: Payer: PRIVATE HEALTH INSURANCE | Attending: Cardiovascular Disease

## 2023-10-09 DIAGNOSIS — Z950 Presence of cardiac pacemaker: Secondary | ICD-10-CM

## 2023-10-09 DIAGNOSIS — I480 Paroxysmal atrial fibrillation: Principal | ICD-10-CM

## 2023-10-09 DIAGNOSIS — I495 Sick sinus syndrome: Secondary | ICD-10-CM

## 2023-10-09 DIAGNOSIS — I10 Essential (primary) hypertension: Secondary | ICD-10-CM

## 2023-10-09 DIAGNOSIS — Z952 Presence of prosthetic heart valve: Principal | ICD-10-CM

## 2023-10-09 NOTE — Progress Notes
 Cardiology Clinic    PATIENT: Andrew [name]  MRN: [id number]  DOB: 06/14/1955  DATE OF SERVICE: 10/09/2023      REFERRING PHYSICIAN: Jeana Francisco, MD    REASON FOR VISIT:  History of aortic valve replacement, routine follow-up    HISTORY OF PRESENT ILLNESS:  Andrew [name] is a delightful 68 y.o. gentleman with past medical history significant for bicuspid aortic valve status post aortic valve replacement in January of 2013 with perioperative CVA, chronic ST elevation in inferior leads, hypertension.  He was referred to our Cardiology Clinic by Dr. Elsie Babe to establish care.  I reviewed his previous records in detail.    Andrew [name] has a history of bicuspid aortic valve.  In January 2013 he underwent aortic valve replacement with bioprosthetic valve at [facility].  Unfortunately, after the procedure he had CVA with residual left leg paresis.  In the perioperative period he was found to have ST elevations in the inferior leads for which he underwent coronary angiogram which was normal.  He had repeat angiogram in May of 2013 for similar findings.  He also has a history of hypertension for which he is on amlodipine  and losartan .    Andrew [name] reports of doing well from cardiovascular standpoint.  He denies of having any chest pain, shortness of breath, orthopnea or paroxysmal nocturnal dyspnea.  Furthermore, he denies of having any dizziness, palpitations or lower extremity edema.  His blood pressure has been well controlled at home.    Andrew [name] is originally from [place]  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  He lives in [place].  He stopped smoking 40 years ago.  He drinks alcohol seldomly in social situations.  There is no history of illicit drug use.  His family history significant for brother who had stroke in his 76s.    Visit 09/21/2020  No change from cardiovascular standpoint.  He denies of having any chest pain, shortness of breath, orthopnea paroxysmal nocturnal dyspnea.  Furthermore, he denies of having any lightheadedness, lower extremity edema or palpitations.  Limited in ambulation due to left-sided hemiparesis as a consequence of stroke.  Had echocardiogram this morning.  Final read pending.  Normal valve function.  Good blood pressure control at home.    Visit 03/08/2021:  Doing well. Continues to do physical therapy. Feels stronger.   No chest pain, shortness of breath, palpitations or dizziness.   BP has been normal at home.  Limited ambulation due to hemiparesis, can walk with a walker.   Echocardiogram and CT chest stable.  Planning to leave for [place] in the next few weeks. Will stay for three months.     Visit 10/11/2021:  He continues to do well from cardiovascular perspective. More specifically he denies of having any chest pain, shortness of breath, orthopnea or paroxysmal nocturnal dyspnea. Furthermore, he denies of having any palpitations, LE edema or lightheadedness.   Completed physical theapy.   Limited ambulation due to hemiparesis, can walk with a walker.   BP has been normal  Stayed in [place] for 3 months    Visit 12/29/2021:  He continues to do well from cardiovascular perspective. More specifically he denies of having any chest pain, shortness of breath, orthopnea or paroxysmal nocturnal dyspnea. Furthermore, he denies of having any palpitations, LE edema or lightheadedness.   Can walk a little bit a walker.   BP has been in the 100 range.  Visit 05/04/2022:  Doing well. No chest pain, shortness of breath, palpitations or dizziness.   Can walk a little bit with a walker.   BP has been normal at home. Checking twice a day.   Still taking carvedilol , losartan  and amlodipine .     Visit 09/07/2022:  Just came back from [place]   Can walk with a walker.   BP normal at home.   No CP, SOB, palpitations or dizziness.   Had echocardiogram today. Final read pending.     Visit 05/29/2023:  Doing well from cardiovascular standpoint. Denies chest pain, SOB, palpitations or dizziness.   Walking very little with a walker.   BP has been normal at home.   Echocardiogram has been stable.    INTERVAL HISTORY:  Visit 10/09/2023:  Recent hospitalization with tachy brady syndrome. Received PPM on 09/14/2023  Upon discharge developed melena. Found to have antral ulcerations. Status post clip  A little bit more SOB than before. No chest pain or palpitations. No dizziness.   BP has been normal at home.   Scheduled to see Dr. Myrle on 10/16/2023  Going to [place] in one month. Will stay there for one month.      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)    Hypertension    Gout    Seborrheic dermatitis    Urinary frequency    H/O aortic valve replacement    Bicuspid aortic valve    Age related osteoporosis    Prediabetes    H/O bicuspid aortic valve    H/O: CVA (cerebrovascular accident)       PAST SURGICAL HISTORY:  Past Surgical History:   Procedure Laterality Date    AORTIC VALVE REPLACEMENT      CARDIAC PACEMAKER PLACEMENT  09/2023    ESOPHAGOGASTRODUODENOSCOPY  09/2023       ALLERGIES:  No Known Allergies    SOCIAL HISTORY:  Social History     Tobacco Use    Smoking status: Former     Current packs/day: 0.00     Average packs/day: 1 pack/day for 15.0 years (15.0 ttl pk-yrs)     Types: Cigarettes     Start date: 05/03/1975     Quit date: 05/02/1990     Years since quitting: 33.4    Smokeless tobacco: Never   Substance Use Topics    Alcohol use: Not Currently     Comment: occasionally has red wine    Drug use: Not Currently       FAMILY HISTORY:  Family History   Problem Relation Age of Onset    No Known Problems Mother     Other (gastric ulcer) Father     Uterine cancer Sister     Stroke Brother     Kidney failure Brother     Colon cancer Neg Hx     Lung cancer Neg Hx     Prostate cancer Neg Hx        REVIEW OF SYSTEMS:  All 14 systems were reviewed and were negative except as mentioned in the HPI    PHYSICAL EXAMINATION:  VITALS: BP 120/80  ~ Pulse 64  ~ Temp 36.8 ?C (98.3 ?F) (Oral)  ~ Resp 24  ~ Ht 5' 6'' (1.676 m)  ~ Wt 185 lb (83.9 kg) Comment: pt is  wearing foot boot ~ SpO2 94%  ~ BMI 29.86 kg/m?    General: Alert and oriented, not in acute distress.  HEENT: Normocephalic, atraumatic. Sclerae are anicteric.   Neck: Supple, trachea midline, JVP flat  Respiratory: Clear to auscultation bilaterally, no wheezing or rales.   Cardiac: RRR, S1/S2 normal, no murmurs, rubs or gallops.   Abdomen: Soft, non-tender, non-distended, normoactive bowel sounds.  Extremities: No cyanosis, clubbing or edema.    Integument: Warm, no petechiae. No acute rash.  Neurologic/Psychiatric: Alert and oriented. Affect is appropriate.       OUTPATIENT MEDICATIONS:  Current Outpatient Medications   Medication Sig    ALLOPURINOL  100 mg tablet TAKE 2 TABLETS BY MOUTH EVERY DAY    amiodarone  200 mg tablet Take 1 tablet (200 mg total) by mouth daily.    amoxicillin 500 mg capsule Use as directed prior to dental appointment (Patient not taking: Reported on 10/02/2023)    apixaban  5 mg tablet Take 1 tablet (5 mg total) by mouth two (2) times daily.    ascorbic acid  500 mg tablet Take 1 tablet (500 mg total) by mouth daily.    ATORVASTATIN  20 mg tablet TAKE 1 TABLET BY MOUTH EVERYDAY AT BEDTIME    CALCIUM  PO Take 600 mg by mouth two (2) times daily.    metoprolol  tartrate 50 mg tablet Take 1 tablet (50 mg total) by mouth two (2) times daily.    Multiple Vitamins-Minerals (HCA SUPER THERAVITE-M PO) Take 1 tablet by mouth daily.    pantoprazole  40 mg DR tablet Take 1 tablet (40 mg total) by mouth two (2) times daily.    vitamin D, cholecalciferol , 25 mcg (1000 units) tablet Take 1 tablet (25 mcg total) by mouth daily.     No current facility-administered medications for this visit.       LABORATORY:  Lab Results Component Value Date    WBC 6.29 09/29/2023    HGB 8.6 (L) 09/29/2023    HCT 26.7 (L) 09/29/2023    MCV 89.6 09/29/2023    PLT 241 09/29/2023      Lab Results   Component Value Date    CREAT 0.96 09/29/2023    BUN 12 09/29/2023    NA 140 09/29/2023    K 4.2 09/29/2023    CL 106 09/29/2023    CO2 25 09/29/2023      Lab Results   Component Value Date    HGBA1C 6.4 (H) 05/25/2023      Lab Results   Component Value Date    ALT 27 09/23/2023    AST 19 09/23/2023    ALKPHOS 47 09/23/2023    BILITOT 0.4 09/23/2023      Lab Results   Component Value Date    TSH 4.1 09/10/2023      Lab Results   Component Value Date    CALCIUM  8.7 09/29/2023    PHOS 4.1 09/23/2023      Lab Results   Component Value Date    CHOL 123 05/25/2023    CHOLHDL 38 (L) 05/25/2023    CHOLDLCAL 68 05/25/2023    CHOLDLQ 61 03/11/2012    TRIGLY 84 05/25/2023       Lab Results  (Last 360 days)        06/11 0158 05/05 1752 04/29 2146    Result             26  52                       36                        DIAGNOSTIC STUDIES:  ECHO 04/20/2019:  1. Left ventricle is normal in size with mild hypertrophy. Left ventricular   function is preserved and visually estimated to be 60-65%. There are no wall   motion abnormalities.   2. Normal biatrial size.   3. Normal right ventricular size and systolic function.   4. Bioprosthetic valve (25-mm Carpentier-Edwards Magna Ease ; 1/13; Dr. Talitha)   is well seated in the aortic position. Peak velocity of 2.26 m/sec, mean   gradient of 11 mmHg, DI of 0.46, effective orifice area of 1.44 cm^2 (based   on LVOT diameter of 2cm)--consistent with normal prosthetic function.   5. Lack of an adequate TR signal precludes accurate estimation of right   ventricular systolic pressure. IVC diameter is = 2.1 cm with a > 50%   inspiratory collapse, suggestive of a right atrial pressure of 0-5 mmHg    ECHO 09/21/2020:   1. Normal LV size, mild septal hypertrophy, normal systolic function, normal LV diastolic function.   2. Left ventricular ejection fraction is approximately 60 to 65%.   3. Normal right ventricular size and normal systolic function.   4. A 25 mm Carpentier bioprosthetic valve is present in the aortic valve position. Echo findings are consistent with normal structure and function of the aortic prosthesis.   5. Normal pulmonary artery systolic pressure.   6. There are no prior echocardiograms available for comparison.    ECHO 09/06/2021:   1. Normal LV size, mild septal hypertrophy, normal wall motion and systolic function.   2. Left ventricular ejection fraction is approximately 60 to 65%.   3. Mild LV diastolic dysfunction (grade I, impaired relaxation).   4. Normal right ventricular size and grossly normal systolic function.   5. A 25 mm Carpentier bioprosthetic valve is present in the aortic valve position. It appears well seated. No paravalvular regurgitation. Peak aortic velocity is 1.9 m/sec and mean gradient is 8.0 mm Hg. By the continuity equation, the aortic valve area  is 1.74 cm? (Indexed AVA is 0.93 cm?/m?).   6. Upper normal aortic root (3.8 cm). Mildly enlarged mid ascending aorta (4.2 cm).   7. No significant valvular regurgitation.   8. Tricuspid regurgitant jet inadequate to assess pulmonary artery systolic pressure.   9. A prior echo performed on 09/21/2020 was reviewed for comparison. No significant changes noted since the previous study.    ECHO 12/29/2021:   1. Normal LV size, mild concentric left ventricular hypertrophy, normal systolic function, indeterminate diastolic function.   2. Left ventricular ejection fraction is approximately 60 to 65%.   3. Normal right ventricular size and normal systolic function.   4. Status post Carpentier bioprosthetic placement in the aortic valve position without regurgitation. Echo findings are consistent with normal structure and function of the aortic prosthesis. The aortic valve area is 1.39 cm? (Indexed AVA is 0.74 cm?/m?).   5. Trace tricuspid regurgitation and normal pulmonary artery systolic pressure.   6. A prior echo performed on 09/06/2021 was reviewed for comparison. The mean AV gradient has increased from 8 mmHg to 12 mmHg. Aortic valve was 1.74 cm2 in the previous study.    ECHO 09/07/2022  1. Normal LV systolic function. Normal left ventricular regional  wall motion.  Left ventricular ejection fraction is approximately 60-65%.  2. Small LV size and moderate concentric left ventricular hypertrophy.  3. Mild LV diastolic dysfunction (grade I, impaired relaxation).  4. Not well seen right ventricular size and normal systolic function.  5. Normal atrial sizes.  6. A 25 mm Carpentier bioprosthetic valve is present in the aortic valve  position. Echo findings are consistent with normal structure and function of  the aortic prosthesis with mean gradient of 14 mmHg and AVA 1.26cm2.  7. Trace mitral valve regurgitation.  8. Trace to mild tricuspid valve regurgitation.  9. Tricuspid regurgitant jet inadequate to assess pulmonary artery systolic  pressure.  10. Normal mean pulmonary artery pressure by PVAT.  11. Mildly enlarged mid ascending aorta (4.1 cm).  12. A prior echo performed on 12/29/2021 was reviewed for comparison.  Carpentier valve measurements and findings as above similar to prior study.    ECHO 09/16/2023   1. Technically very difficult study with suboptimal views.   2. Small LV size and severe concentric left ventricular hypertrophy.   3. Hyperdynamic LV systolic function. Normal left ventricular regional wall motion. Left ventricular ejection fraction is approximately >75%.   4. Mild LV diastolic dysfunction (grade I, impaired relaxation).   5. Normal right ventricular size. Normal RV systolic function.   6. A 25 mm Carpentier bioprosthetic valve is present in the aortic valve position. Echo findings are consistent with normal structure and function of the aortic prosthesis.   7. A prior echo performed on 08/08/2023 was reviewed for comparison. No significant changes noted since the previous study.    CT chest 09/21/2020:  Postoperative changes of aortic valve replacement. Ectasia of the ascending aorta measures up to 3.9 cm in the proximal arch grossly unchanged since 2013    PYP scan 09/20/2023  No scintigraphic evidence of ATTR cardiac amyloidosis.     ECG 06/07/2020:  Normal sinus rhythm at ventricular rate of 74 beats per minute.  Mild ST elevation in inferior leads.  Described before.     []  Reviewed/ordered radiology  []  Independently interpreted studies    [x]  Reviewed/ordered labs  [x]  Reviewed & summarized old records    [x]  Reviewed/ordered diag med test   []  Decided to get outside medical records      []  Obtained history from someone other than patient    ASSESSMENT/PLAN:  1. Aortic valve replacement in 2013  Normal valve function on most recent echocardiogram in 09/2023. Repeat echo yearly.   We discussed that some degeneration of the bioprosthetic valve can be expected 10 years after surgery so he will need close follow-up with serial echocardiograms  Continue aspirin     2. Bicuspid aortic valve  We discussed with there is an increased risk of having aortic aneurysm in patients with bicuspid aortic valve  Will need serial imaging. Most recent test in 08/2020. Stable from 2013. No change on echo in 09/2022 and 09/2023.   First-degree family members should be screened with echocardiograms    3. History of CVA  Residual leg weakness    4. Hyperlipidemia  On atorvastatin . LDL stable.     5. Abnormal EKG  Patient has ST elevations in the inferior leads which have been present before.  I reviewed the records.  No further testing is necessary in this regard.  He had 2 negative coronary angiograms    6. Hypertension  Good control on Coreg , anlodipine and Losartan . CtM    7. Atrial fibrillation  8. Tachy  brady syndrome  S/P PPM placement on 09/14/2023  Continue metoprolol   On anticoagulation with Eliquis   Follow up with Dr. Myrle on 7/16    9. GI bleed  Found to have antral ulcerations on EGD. S/P multiple clips  Stable     Follow up visit in 3 months    Please note, I spent a total of 40 minutes for this visit including chart review, test interpretation, history taking, physical examination, counseling, coordination of care and documentation.    Plan of care was discussed with the patient and family in detail. All questions answered.    Daria Mcmeekin, MD  Advanced Heart Failure and Transplantation Cardiology  Pager [phone]

## 2023-10-13 ENCOUNTER — Other Ambulatory Visit: Payer: PRIVATE HEALTH INSURANCE

## 2023-10-13 NOTE — Consults
 GASTROENTEROLOGY CONSULT NOTE  10/14/2023      CONSULTANT: Yasmyn Bellisario B. Tobie, MD  PATIENT: Andrew [name]  MRN: [id number]  DOB: [birth year]  DATE OF SERVICE: 10/14/2023  REFERRING PROVIDER: Jackson Dorn FALCON., MD  PRIMARY CARE PROVIDER: Jeana Francisco, MD    REASON FOR REFERRAL:   Chief Complaint   Patient presents with    Establish Care        Subjective:     Chief Complaint:  Andrew [name] is a 68 y.o. 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    Presents to clinic with wife today.    Recently admitted to [facility] from 6/9 - 09/21/2023 for syncope due to tachy-brady syndrome s/p PPM. Hospital course complicated by new onset melena. Underwent EGD 6/17 showing multiple antral ulcerations along with one possible Forrest IIa vs IIc ulcer s/p hemostasis with combination of epinephrine injection and hemoclip placement. He was readmitted shortly after discharge from 6/22 - 09/29/2023 for recurrent melena. S/p repeat EGD 6/23 again showing ulcer with high-risk stigmata (Forrest class IIa) s/p placement of OTSC (Ovesco) as prior endoclips had fallen off. Recommended repeat EGD in 2-3 months to confirm ulcer healing. No biopsies taken to evaluate for dysplasia.    Since discharge, reports feeling well overall. No further melena or overt GI bleeding. He denies any abdominal pain, nausea, or vomiting.    Reports adherence to BID dosing of PPI therapy. No NSAID use prior to onset of symptoms or since time of discharge. On anticoagulation with Eliquis  BID.    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)    Hypertension    Gout    Seborrheic dermatitis    Urinary frequency    H/O aortic valve replacement    Bicuspid aortic valve    Age related osteoporosis    Prediabetes    H/O bicuspid aortic valve    H/O: CVA (cerebrovascular accident)    AF (paroxysmal atrial fibrillation) (HCC/RAF)    Tachy-brady syndrome (HCC/RAF)    Cardiac pacemaker in situ      Past Medical History:   Diagnosis Date    Antral ulcer 09/2023    CVA (cerebral vascular accident) (HCC/RAF)     H/O aortic valve replacement     Kidney stone     Urinary frequency      Past Surgical History:   Procedure Laterality Date    AORTIC VALVE REPLACEMENT      CARDIAC PACEMAKER PLACEMENT  09/2023    ESOPHAGOGASTRODUODENOSCOPY  09/2023       Relevant Family History:  (yes if checked)  []   Family history of colorectal cancer    []   Family history of GI cancer (not CRC)   []   Family history of IBD   []   Family history of celiac disease    Relevant Social History:    Social History     Socioeconomic History    Marital status: Married   Tobacco Use    Smoking status: Former     Current packs/day: 0.00     Average packs/day: 1 pack/day for 15.0 years (15.0 ttl pk-yrs)     Types: Cigarettes     Start date: 05/03/1975     Quit date:  05/02/1990     Years since quitting: 33.4    Smokeless tobacco: Never   Substance and Sexual Activity    Alcohol use: Not Currently     Comment: occasionally has red wine    Drug use: Not Currently     Social Drivers of Health     Financial Resource Strain: Medium Risk (09/23/2023)    Financial Resource Strain     Difficulty of Paying Living Expenses: Somewhat hard       Occupational History    Not on file          MEDICATIONS:     Current Outpatient Medications   Medication Instructions    allopurinol  (ZYLOPRIM ) 200 mg, Oral, Daily    amiodarone  200 mg, Oral, Daily    amoxicillin 500 mg capsule Use as directed prior to dental appointment    apixaban  (ELIQUIS ) 5 mg, Oral, 2 times daily    ascorbic acid  500 mg, Daily    atorvastatin  (LIPITOR) 20 mg, Oral, Nightly    CALCIUM  PO 600 mg, 2 times daily    metoprolol  tartrate (LOPRESSOR ) 50 mg, Oral, 2 times daily    Multiple Vitamins-Minerals (HCA SUPER THERAVITE-M PO) 1 tablet, Daily    pantoprazole  (PROTONIX ) 40 mg, Oral, 2 times daily    vitamin D (cholecalciferol ) 25 mcg, Daily         Allergies :    has no known allergies.    PHYSICAL EXAM      Last Recorded Vital Signs:    10/14/23 1008   BP: 119/83   Pulse: 69   Temp: 36.7 ?C (98 ?F)   SpO2: (!) 92%      Body mass index is 30.67 kg/m?SABRA    System Check if examined and normal Positive or additional negative findings   Constit  [x]  General appearance - normal     Eyes  [x]  Conjuctivae clear       HENMT  [x]  Normal Lips/teeth/gums, oropharynx, head     Neck  []  Inspection/palpation; thyroid normal      Resp  []  Effort good. Clear to auscultation bilaterally.     CV  []  Normal Rhythm/rate without murmur     Abdomen  []  Abdomen soft and NT, no rebound/guarding   []  Active BS 4 quadrants   []  No appreciable flank or shifting dullness   []  No hepatosplenomegaly   []  No umbilical hernia    Rectal   []  No external hemorrhoids   []  No masses palpated on DRE   []  Brown stool in vault   []  Appropriate resting and squeeze pressure   []  Appropriate perineal descent     MSK  []  Grossly normal strength, ROM     Neuro   []  Grossly normal   Ambulates in wheelchair   Psychiatric  [x]   Oriented to time, place and person   [x]   Appropriate insight/judgement & mood/affect                Lab Review (reviewed below and per HPI):  Lab Results   Component Value Date    WBC 6.29 09/29/2023    HGB 8.6 (L) 09/29/2023    HCT 26.7 (L) 09/29/2023    MCV 89.6 09/29/2023    PLT 241 09/29/2023     Lab Results   Component Value Date    CREAT 0.96 09/29/2023    BUN 12 09/29/2023    NA 140 09/29/2023    K 4.2 09/29/2023    CL  106 09/29/2023    CO2 25 09/29/2023    ALT 27 09/23/2023    AST 19 09/23/2023    ALKPHOS 47 09/23/2023    BILITOT 0.4 09/23/2023    ALBUMIN 3.2 (L) 09/23/2023     Lab Results   Component Value Date    INR 0.9 09/26/2023     No results found for: ''SRWEST'', ''CRP'', ''GLIADINIGA'', ''GLIADINIGG'', ''ENDOMAB'', ''TRNGLUTIGAAB'', ''IGASER''  Lab Results   Component Value Date    INR 0.9 09/26/2023     Lab Results   Component Value Date    HELPYLAGSTL Negative 09/19/2023         Imaging (reviewed below and per HPI):  09/22/2023 CT ABD+PELVIS ANGIOGRAM WO+W CONTRAST   IMPRESSION:  1.  No evidence of mesenteric ischemia and no definite active contrast extravasation. Minimal high density material in the gastric antrum only seen on arterial phase imaging which does not persist on venous phase likely redistribution of existing hyperdense material in the gastric lumen. No acute CT abnormality in the abdomen or pelvis.  2.  Uncomplicated cholelithiasis.      GI Studies (reviewed below and per HPI):  See HPI    I reviewed these specialist notes that directly relate to the patient's acute and/or chronic medical problems with documentation of the salient findings, if any:     Assessment / Encounter Diagnoses addressed on 10/14/2023:     1. 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    # Peptic ulcer disease  Personally reviewed images from recent endoscopies, which showed benign-appearing gastric ulcer with high-risk stigmata, which was likely source of prior upper GI bleed. Denies NSAID use preceding onset of symptoms and H pylori stool Ag testing negative. No extravasation or other sources of GI bleeding seen on CT angiogram. Although no high-risk features suggestive of malignancy present, no biopsies taken at time of index endoscopies to rule out dysplasia. Thus, would be important to repeat endoscopy in 2-3 months to ensure healing with high-dose PPI therapy and rule out malignancy.    Plan:     1. Peptic ulcer disease  - H pylori Ag, Stool; Future  - pantoprazole  40 mg DR tablet; Take 1 tablet (40 mg total) by mouth two (2) times daily.  Dispense: 60 tablet; Refill: 3  - GI Endoscopy Procedures     - Repeat H pylori stool antigen as yield may be diminished in setting of acute GIB, though yield may still also be diminished while on PPI therapy; can also biopsy for this at time of repeat endoscopy  - Continue PPI BID for 8-12 week course followed by daily indefinitely thereafter for PUD prophylaxis  - Referral for repeat EGD with MAC placed   Per ASGE guideline for management of antithrombotic agents for patients undergoing GI endoscopy, would ideally hold Eliquis  2 days prior to elective EGD; he will discuss this with his Cardiologist as well  - Counseled to avoid NSAIDs    Follow-up for the above conditions will be based on results of treatment and testing as discussed above.    The above recommendation were discussed with the patient. The patient has all questions answered satisfactorily and is in agreement with this recommended plan of care.    Jaydynn Wolford B. Tobie, MD   Clinical Instructor of Medicine  [facility] Department of Medicine  Division of  Digestive Diseases  10/14/2023 10:36 AM    cc: Jackson Dorn FALCON., MD

## 2023-10-14 ENCOUNTER — Ambulatory Visit: Payer: Commercial Managed Care - HMO

## 2023-10-14 ENCOUNTER — Inpatient Hospital Stay: Payer: Commercial Managed Care - HMO

## 2023-10-14 ENCOUNTER — Ambulatory Visit: Payer: Commercial Managed Care - Pharmacy Benefit Manager

## 2023-10-14 DIAGNOSIS — R5383 Other fatigue: Secondary | ICD-10-CM

## 2023-10-14 DIAGNOSIS — R0681 Apnea, not elsewhere classified: Principal | ICD-10-CM

## 2023-10-14 DIAGNOSIS — K279 Peptic ulcer, site unspecified, unspecified as acute or chronic, without hemorrhage or perforation: Principal | ICD-10-CM

## 2023-10-14 MED ORDER — PANTOPRAZOLE SODIUM 40 MG PO TBEC
40 mg | ORAL_TABLET | Freq: Two times a day (BID) | ORAL | 3 refills | 30.00000 days | Status: AC
Start: 2023-10-14 — End: 2023-10-15

## 2023-10-14 MED ORDER — METOPROLOL TARTRATE 50 MG PO TABS
50 mg | ORAL_TABLET | Freq: Two times a day (BID) | ORAL | 3 refills | 30.00000 days | Status: AC
Start: 2023-10-14 — End: ?

## 2023-10-14 MED ORDER — LANSOPRAZOLE 30 MG PO CPDR
ORAL_CAPSULE | 0 refills
Start: 2023-10-14 — End: ?

## 2023-10-14 NOTE — Patient Instructions
 Work-up and additional testing:  Referral placed for EGD at [id number];   The scheduling department will call you to schedule your procedure. However, if you don't hear from them within the next week, you can also call the Patient Communication Center [facility] at [phone] to schedule it yourself.   Per ASGE guideline for management of antithrombotic agents for patients undergoing GI endoscopy, would ideally hold Eliquis  2 days prior to elective endoscopy; please also discuss this with your Cardiologist   Labs ordered, can go to [address] to pick up stool kit    Treatments:  Continue Pantoprazole  40mg  twice daily, take 30 minutes before meals, until time of your upper endoscopy  Avoid NSAIDs

## 2023-10-14 NOTE — Progress Notes
 [facility]  [address]  [address]  [place] [address]  Phone: [phone]  FAX: [phone]    URGENT CARE VISIT - ADULT    Subjective:     CC  Fatigue (Pt has sleep apnea which causing fatigue. Trouble sleeping, feels tired all the time  /Pt is scheduled for sleep study until 7/29. )    HPI  Olis Viverette is a 68 y.o. male with an extensive medical history, presents to the clinic requesting an expedited appointment with sleep medicine.  Patient is here with wife who reports that he has a scheduled appointment with sleep medicine at the end of July and would like an earlier appointment.  She reports that patient has been continuing to have at the gastroesophageal when sleeping and has been experiencing daytime somnolence as well.  There was concern for possible sleep apnea when patient was admitted to the hospital for extended period of time recently    Review of Systems  Complete review of systems was performed and found to be negative except as noted in HPI.     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    Social History  Social History     Socioeconomic History    Marital status: Married   Tobacco Use    Smoking status: Former     Current packs/day: 0.00     Average packs/day: 1 pack/day for 15.0 years (15.0 ttl pk-yrs)     Types: Cigarettes     Start date: 05/03/1975     Quit date: 05/02/1990     Years since quitting: 33.4    Smokeless tobacco: Never   Substance and Sexual Activity    Alcohol use: Not Currently     Comment: occasionally has red wine    Drug use: Not Currently     Social Drivers of Health     Financial Resource Strain: Medium Risk (09/23/2023)    Financial Resource Strain     Difficulty of Paying Living Expenses: Somewhat hard     Medications/Supplements  Medications that the patient states to be currently taking   Medication Sig    ALLOPURINOL  100 mg tablet TAKE 2 TABLETS BY MOUTH EVERY DAY    amiodarone  200 mg tablet Take 1 tablet (200 mg total) by mouth daily.    apixaban  5 mg tablet Take 1 tablet (5 mg total) by mouth two (2) times daily.    ATORVASTATIN  20 mg tablet TAKE 1 TABLET BY MOUTH EVERYDAY AT BEDTIME    CALCIUM  PO Take 600 mg by mouth two (2) times daily.    metoprolol  tartrate 50 mg tablet Take 1 tablet (50 mg total) by mouth two (2)  times daily.    Multiple Vitamins-Minerals (HCA SUPER THERAVITE-M PO) Take 1 tablet by mouth daily.    pantoprazole  40 mg DR tablet Take 1 tablet (40 mg total) by mouth two (2) times daily.    vitamin D, cholecalciferol , 25 mcg (1000 units) tablet Take 1 tablet (25 mcg total) by mouth daily.     Allergies  Patient has no known allergies.    Objective:     Physical Exam  BP 144/95 (BP Location: Left arm, Patient Position: Sitting, Cuff Size: Large)  ~ Pulse 71  ~ Temp 36.8 ?C (98.3 ?F) (Oral)  ~ Resp 17  ~ SpO2 96%   General: Appears comfortable, no acute distress, A&Ox4  Neck: Supple, FROM, no stridor  Resp: CTAB, no r/w/r  CV: RRR, no r/g/m  Skin: No rashes, no jaundice  Ext: No c/c/e, WWP  Musculoskeletal: FROM all joints, nontender extremities  Neuro: MAE spont. Sensation grossly intact throughout  Psychiatric: Mood, memory, affect and judgment normal    Recent Labs/Studies  No results found for this or any previous visit (from the past week).    Recent Imaging Results  CT abd w contrast  Result Date: 09/25/2023  IMPRESSION: No acute CT abnormality in the abdomen or pelvis. Interval placement of Ovesco clip in the gastric antrum, otherwise no significant change since prior CT. I, Elspeth Ellen, M.D., have reviewed this radiological study personally and I am in full agreement with the findings of the report presented here. Dictated by: Linda Eddy   09/25/2023 7:07 PM Signed by: Elspeth Ellen   09/25/2023 11:04 PM    CT abd+pelvis angiogram wo+w contrast  Result Date: 09/22/2023  IMPRESSION: 1.  No evidence of mesenteric ischemia and no definite active contrast extravasation. Minimal high density material in the gastric antrum only seen on arterial phase imaging which does not persist on venous phase likely redistribution of existing hyperdense material in the gastric lumen. No acute CT abnormality in the abdomen or pelvis. 2.  Uncomplicated cholelithiasis. I, Nazanin Yaghmai, M.D., have reviewed this radiological study personally and I am in full agreement with the findings of the report presented here. Dictated by: Frank Salamone   09/22/2023 7:46 PM Signed by: Nazanin Yaghmai   09/22/2023 8:19 PM    NM Cardiac Amyloidosis Tc30m Pyrophosphate  Result Date: 09/20/2023  No scintigraphic evidence  of ATTR cardiac amyloidosis. Thank you for referring this patient to the [facility]. I, Jodine Buggy, MD have reviewed this Nuclear Medicine study personally, and am in agreement with the report presented here. Dictated by: Eliott Room   09/20/2023 3:42 PM     Echo adult transthoracic w definity   Result Date: 09/16/2023  :  1. Technically very difficult study with suboptimal views.  2. Small LV size and severe concentric left ventricular hypertrophy.  3. Hyperdynamic LV systolic function. Normal left ventricular regional wall motion. Left ventricular ejection fraction is approximately >75%.  4. Mild LV diastolic dysfunction (grade I, impaired relaxation).  5. Normal right ventricular size. Normal RV systolic function.  6. A 25 mm Carpentier bioprosthetic valve is present in the aortic valve position. Echo findings are consistent with normal structure and function of the aortic prosthesis.  7. A prior echo performed on 08/08/2023 was reviewed for comparison. No significant changes noted since the previous study. FINDINGS: LEFT VENTRICLE: Left ventricular endocardium was not well-visualized. Recommend echo contrast for future studies. Small left ventricular size. Severe concentric left ventricular hypertrophy. Hyperdynamic systolic function. Visually estimated left  ventricular ejection fraction >75%. Patient demonstrated normal sinus rhythm during echocardiogram. Mild LV diastolic dysfunction (grade I, impaired relaxation). No thrombus was visualized within the left ventricle. LV Wall Motion: All segments are normal. RIGHT VENTRICLE: Normal right ventricular size. Normal RV systolic function. LEFT ATRIUM: Normal left atrial size. RIGHT ATRIUM: Normal right atrial size. Right atrial pressure is estimated at 3 mmHg. MITRAL VALVE: Thickened mitral valve leaflets. No mitral annular calcification. No evidence of mitral valve regurgitation. TRICUSPID VALVE: The tricuspid regurgitation jet was not well seen. AORTIC VALVE: A 25 mm Carpentier bioprosthetic valve is present in the aortic valve position. Echo findings are consistent with normal structure and function of the aortic prosthesis. Normal prosthetic valve function. The aortic valve area is 1.20 cm (Indexed AVA is 0.61 cm/m). The aortic valve maximum velocity is 2.4 m/s. The peak gradient across the aortic valve is 22 mmHg. The mean gradient is 11 mmHg. PULMONIC VALVE: The pulmonic valve was not well visualized. No indication of pulmonic valve regurgitation. AORTA: Normal aortic root (3.5 cm). Normal mid ascending aorta (3.5 cm). SYSTEMIC VEINS: The inferior vena cava is normal in size and exhibits greater than 50% respiratory change. PERICARDIUM: There is no evidence of pericardial effusion.  2D AND M-MODE MEASUREMENTS: Left Ventricle:         Aorta/Left Atrium: IVSd (2D):      2.0 cm  Aortic Root, d (2D): 3.5 cm LVPWd (2D):     2.1 cm  Right Ventricle: LVIDd (2D):     2.4 cm  RV basal minor, A4C: 3.7 cm LV RWT:         1.78    RV Mid-cavity:       2.8 cm LV mass:        221     RV major, A4C:       6.6 cm LV SYSTOLIC FUNCTION BY 2D PLANIMETRY (MOD): WMSI: 1.0  LV DIASTOLIC FUNCTION: MV Peak E: 49 cm/s  e' lateral: 6.2 cm/s E/e' lateral: 7.9 MV Peak A: 123 cm/s Decel Time: 172 msec E/A Ratio: 0.4 RA Volumes: RA VOL A4C:        26 ml    RA Vol A4C index: 13 ml/m RA area: RA area s, MOD A4C 11.9 cm RA vol s, MOD A4C 26.5 ml LA Volumes: LA Vol A4C:       24 ml    LA Vol A4C index: 12 ml/m LA area: LA area s MOD A4C 12.6 cm LA vol s, MOD A4C 24 ml Aortic Valve: AoV Max Vel: 2.4 m/s AoV Peak PG: 22 mmHg AoV Mean PG: 11 mmHg LVOT Vmax: 0.81 m/s LVOT VTI: 12.0 cm  AoV VTI:       31.4 cm LVOT SV:   38 ml    LVOT SI:  19 ml/m LVOT Diameter: 2.0 cm AoV Area, Vmax: 1.08 cm AoV Area, VTI:  1.20 cm                          AoV Area index: 0.61 cm/m Dimensionless index: 0.38 Tricuspid Valve and PA/RV Systolic Pressure: RA Pressure: 3 mmHg Aorta: Ao Asc: 3.5 cm Systemic Veins: IVC Diameter: 1.95 cm  [id number] Rachele Haste MD Electronically signed by [id number] Rachele Haste MD on 09/16/2023 at 1:38:41 PM cc: [phone] [facility]   Final       Assessment/Plan:     1. Apnea  TSH with reflex FT4, FT3  2. Other fatigue  TSH with reflex FT4, FT3        - patient well appearing and in no distress at this time  - advised to follow-up with PCP to see if an earlier sleep study can be done  - will check TSH in the meantime  - strict ER/return precautions given in the event of worsening symptoms.     No follow-ups on file.     New or Modified Medications for this Encounter   New    No medications on file   Modified    No medications on file Discontinued Medications    No medications on file        Cosby Proby A. Chalmers, MD  10/14/2023 at 3:05 PM

## 2023-10-15 MED ORDER — OMEPRAZOLE 20 MG PO CPDR
20 mg | ORAL_CAPSULE | Freq: Two times a day (BID) | ORAL | 3 refills | 90.00000 days | Status: AC
Start: 2023-10-15 — End: ?

## 2023-10-16 ENCOUNTER — Ambulatory Visit: Payer: Commercial Managed Care - HMO

## 2023-10-16 ENCOUNTER — Ambulatory Visit: Payer: PRIVATE HEALTH INSURANCE

## 2023-10-16 DIAGNOSIS — Z7901 Long term (current) use of anticoagulants: Secondary | ICD-10-CM

## 2023-10-16 DIAGNOSIS — Z950 Presence of cardiac pacemaker: Secondary | ICD-10-CM

## 2023-10-16 DIAGNOSIS — I480 Paroxysmal atrial fibrillation: Principal | ICD-10-CM

## 2023-10-16 MED ORDER — AMIODARONE HCL 200 MG PO TABS
200 mg | ORAL_TABLET | Freq: Every day | ORAL | 3 refills | 35.00000 days | Status: AC
Start: 2023-10-16 — End: ?

## 2023-10-16 NOTE — Consults
 Cardiac Electrophysiology Out Patient Consultation  [facility]    Date/Time: 10/16/2023  Referring Physician: Jeana Francisco, MD    Patient: Andrew [name]  Date of Birth: 08/30/1955    Reason for Consultation:   EP Care    Chief complain:   Chief Complaint   Patient presents with    Follow-up     Problem List:   Andrew [name] is a 68 y.o. male who presents for initial EP Consult with history of:    - Atrial fibrillation, Paroxysmal, (Age between 38 and 65, HTN, and Stroke/TIA/thromboembolic event (2)) CHA2DS2VASc=4   - Anticoagulation: Apixaban   - Antiarrhythmic history:  - Current: Amiodarone   - Ablation / cardioversion history:  - None  - Pacemaker,dual chamber, Medtronic, implanted July 2025  - Remote monitoring: No  - Cardiac surgery; AVR 2013  - PUD; GI Bleeding   - June 2025 endoclips     History of Present Illness:   Andrew [name] is a 68 y.o. male who presents for initial EP Consult with me due to a history of an implantable cardiac device who is seen for EP device and device interrogation as well as GI bleeding and PAF among others here for follow up after recent (2 admissions) for syncope and GI bleeding. On Amiodarone  + OA.    Past Medical History:   As above in addition to:    Past Medical History:   Diagnosis Date    Antral ulcer 09/2023    CVA (cerebral vascular accident) (HCC/RAF)     H/O aortic valve replacement     Kidney stone     Urinary frequency        Past Surgical History:     Past Surgical History:   Procedure Laterality Date    AORTIC VALVE REPLACEMENT      CARDIAC PACEMAKER PLACEMENT  09/2023    ESOPHAGOGASTRODUODENOSCOPY  09/2023     Family History:     Family History   Problem Relation Age of Onset    No Known Problems Mother     Other (gastric ulcer) Father     Uterine cancer Sister     Stroke Brother     Kidney failure Brother     Colon cancer Neg Hx     Lung cancer Neg Hx     Prostate cancer Neg Hx        Negative for sudden cardiac death, premature CAD    Social History:     Social History     Tobacco Use    Smoking status: Former     Current packs/day: 0.00     Average packs/day: 1 pack/day for 15.0 years (15.0 ttl pk-yrs)     Types: Cigarettes     Start date: 05/03/1975     Quit date: 05/02/1990     Years since quitting: 33.4    Smokeless tobacco: Never   Substance Use Topics    Alcohol use: Not Currently     Comment: occasionally has red wine    Drug use: Not Currently       Home Medications:     Outpatient Medications Prior to Visit   Medication Sig Dispense Refill    ALLOPURINOL  100 mg tablet TAKE 2 TABLETS BY MOUTH EVERY DAY 180 tablet 1    amiodarone  200 mg tablet Take 1 tablet (200 mg total) by mouth daily. 30 tablet 0    apixaban  5 mg tablet Take 1 tablet (5 mg total) by mouth two (2) times daily.  180 tablet 3    ascorbic acid  500 mg tablet Take 1 tablet (500 mg total) by mouth daily.      ATORVASTATIN  20 mg tablet TAKE 1 TABLET BY MOUTH EVERYDAY AT BEDTIME 90 tablet 3    CALCIUM  PO Take 600 mg by mouth two (2) times daily.      metoprolol  tartrate 50 mg tablet Take 1 tablet (50 mg total) by mouth two (2) times daily. 180 tablet 3    Multiple Vitamins-Minerals (HCA SUPER THERAVITE-M PO) Take 1 tablet by mouth daily.      omeprazole  20 mg DR capsule Take 1 capsule (20 mg total) by mouth two (2) times daily. 60 capsule 3    vitamin D, cholecalciferol , 25 mcg (1000 units) tablet Take 1 tablet (25 mcg total) by mouth daily.      amoxicillin 500 mg capsule Use as directed prior to dental appointment (Patient not taking: Reported on 10/02/2023)       No facility-administered medications prior to visit.       Allergies:   No Known Allergies    Review of Systems:   A 14-system review of systems was obtained and unremarkable except as noted above.    Physical Exam:   Vitals: BP 125/79  ~ Pulse 71  ~ Temp 36.5 ?C (97.7 ?F) (Temporal)  ~ Resp 17  ~ Ht 5' 6'' (1.676 m)  ~ Wt 190 lb (86.2 kg)  ~ SpO2 94%  ~ BMI 30.67 kg/m?   General: Awake. No acute distress. Well-developed and well-nourished.  Eyes: Extraocular movements intact. No scleral icterus or conjunctival pallor.  ENMT: Oropharynx clear. Mucous membranes moist.  Neck: Supple. Trachea midline. No appreciable thyromegaly.  Lungs: Clear to auscultation bilaterally. No accessory muscle use.  Cardiovascular: No precordial lifts or thrills. Regular rate and rhythm with normal S1 and S2. No gallops, murmurs, or rubs appreciated. Carotid pulses 2+ bilaterally without bruits. Jugular venous pressure 7 cm. Radial, femoral, and dorsalis pedis pulses 2+ bilaterally. No lower extremity edema.  Abdomen: Soft, nontender, and nondistended. Normoactive bowel sounds. No hepatomegaly or fluid wave.  Skin: Warm, dry. No rash.  Neuro: Grossly nonfocal.    Laboratory Data:     Lab Results   Component Value Date    NA 140 09/29/2023    K 4.2 09/29/2023    CL 106 09/29/2023    CO2 25 09/29/2023    BUN 12 09/29/2023    CREAT 0.96 09/29/2023    CALCIUM  8.7 09/29/2023    MG 1.7 09/26/2023     Lab Results   Component Value Date    CHOL 123 05/25/2023    CHOLHDL 38 (L) 05/25/2023    CHOLDLCAL 68 05/25/2023    CHOLDLQ 61 03/11/2012    TRIGLY 84 05/25/2023    Lab Results   Component Value Date    WBC 6.29 09/29/2023    HGB 8.6 (L) 09/29/2023    HCT 26.7 (L) 09/29/2023    MCV 89.6 09/29/2023    PLT 241 09/29/2023    INR 0.9 09/26/2023    APTT 26.3 09/26/2023     Lab Results   Component Value Date    AST 19 09/23/2023    ALT 27 09/23/2023    ALKPHOS 47 09/23/2023    BILITOT 0.4 09/23/2023    ALBUMIN 3.2 (L) 09/23/2023     Lab Results   Component Value Date    BNP 26 09/11/2023    TSH 4.1 09/10/2023    HGBA1C 6.4 (  H) 05/25/2023        Diagnostic/Imaging Studies:       Diagnostic/Imaging Studies: All below reviewed independently by me today     Electrocardiogram (all ECGs reviewed by me)  June 2025 - AP-VS rhythm     Cardiac Device Interrogation:  I have performed a full device interrogation today 10/16/2023  [x]  PPM []  ICD []  CRT-D []  CRT-P []  ILR    []   [employer]  [x]   Medtronic []  [employer] []  Biotronik   MRI Conditional []  YES []  NO    Time to ERI: 11.8  years  Device Dependent []  YES [x]  NO   []  Relatively   Mode: []  DDD [x]  DDDR  []  VVI []  VVIR    AP: 80 %; VP 1 %  AMS: [x]  YES []  NO    Remote monitor: [x]  YES []  NO    Permanent programmed changes made: None    Event Recorder (reviewed by me)  N/A    Echocardiogram (reviewed by me)  Results for orders placed during the hospital encounter of 08/08/23  Echo adult transthoracic complete 08/08/2023 08/13/2023  Impression  1. Normal left ventricular size, moderate concentric left ventricular hypertrophy and normal LV systolic function.  2. LV ejection fraction is approximately 60 to 65%.  3. Abnormal LV diastolic function (Grade I).  4. A 25 mm Carpentier bioprosthetic valve is present in the aortic position. Echo findings are consistent with normal structure and function of the aortic prosthesis with mean gradient of 11 mmHg.  5. The peak TR velocity is not well defined, and therefore, pulmonary artery systolic pressure cannot be calculated.  6. Mildly dilated proximal ascending aorta (41 mm).  7. Prior examinations are available and were reviewed for comparison purposes. Compared to prior study on 09/07/22, there are no significant changes.    EPS/RFA  N/A    Coronary Angiogram (reviewed by me)  N/A    Stress Test  No results found for this or any previous visit.  No results found for this or any previous visit.    Advanced Cardiac Imaging (reviewed by me)  No results found for this or any previous visit.  No results found for this or any previous visit.    Review of Data:  I have:   [x]  Reviewed/ordered []   1 []   2 [x]   >=  3 unique laboratory, radiology, and/or diagnostic tests as noted  [x]  Reviewed/ordered []   1 []   2 [x]   >=  3 prior external notes and incorporated into patient assessment  []  Discussed management or test interpretation with external provider(s) as noted:    Lab Studies Reviewed on this Encounter:  Reviewed Lab Comment   [x]   CBC    [x]   Basic Metabolic Panel    []   Lipid Panel    []  HgbA1c    []  Troponin    [x]   BNP    [x]   Other      Diagnostic Studies Reviewed/Ordered on this Encounter:  Reviewed Ordered Diagnostic Test   [x]   []   ECG   []   []   Stress ECG   []   []   Echo (TTE/TEE)   []   []   Stress TTE   []   []   MPS   []   []   Event/Holter   []   []   Catheterization   [x]   [x]   Cardiac Device Interrogation   []   []   EPS Ablation      Impression:   Andrew [name] is a  68 y.o. male with:    - Atrial fibrillation, Paroxysmal, (Age between 46 and 30, HTN, and Stroke/TIA/thromboembolic event (2)) CHA2DS2VASc=4   - Anticoagulation: Apixaban   - Antiarrhythmic history:  - Current: Amiodarone   - Ablation / cardioversion history:  - None  - Pacemaker,dual chamber, Medtronic, implanted July 2025  - Remote monitoring: No  - Cardiac surgery; AVR 2013  - PUD; GI Bleeding   - June 2025 endoclips    Discussion:     As below     Recommendations/Plan:      - Rhythm control: AAD  - Stroke risk reduction: OA   - Monitor for recurrent bleeding   - Discussed LAAE in the future  - PPM check next visit 3 months  - Consider concomitant procedure    Current plan of care was discussed in detail with the patient and all questions were answered.     Eric Melnick, MD  Attending Electrophysiologist  [facility]

## 2023-10-16 NOTE — Progress Notes
 SEE MEDIA SECTION FOR ACTUAL PRINTOUT     DATE OF SERVICE: 10/16/2023     Manufacturer: Medtronic   Model: [address] HEWS DR MRI [id number]  Serial #: [id number]  Implant Date: 09/14/2023  Indication: SSS     Battery Longevity: 11.8 years     Underlying rhythm: Sinus bradycardia 41 bpm     Thresholds  P wave: 2.4 mV  R wave: 3.9 mV  RA lead impedance: 342 ohms  RV lead impedance: 437 ohms  RA capture voltage: 0.75 V @ 0.4 ms  RV capture voltage: 0.75 V @ 0.4 ms     Since 09/16/2023:  Afib/AFL/SVT episodes: AT/AF x2. Latest detected on 10/05/2023 at 1157 hrs.   Non-sustained episodes x28 for AT. Latest detected on 09/18/2023 at 2151 hrs.   Atrial Burden: <0.1%  VT/Vfib episodes: None  Ap= 80.2%; Vp=<0.1%     Settings: AAI <=> DDD 60 - 130 ppm   Upper Sensor Rate: 130 ppm      Changes made this session: None, temporarily reprogrammed for testing purposes only.     Impressions:   1. Normal pacemaker function.  2. Patient is not pacemaker dependent at this time.  3. AT/AF as noted above. Please refer to scanned report for further details.     Plan:   1. Patient to see Dr. Myrle today printout. Please see separate EP note.  2. Return to device clinic in accordance with Dr. Myrle' clinical note, with remote transmission in the interim.      I have read the device note and agree with the technologist note.     Eric Myrle, MD  Attending Physician, Cardiac Electrophysiology

## 2023-10-17 ENCOUNTER — Other Ambulatory Visit: Payer: Commercial Managed Care - Pharmacy Benefit Manager

## 2023-10-18 ENCOUNTER — Other Ambulatory Visit: Payer: Commercial Managed Care - Pharmacy Benefit Manager

## 2023-10-23 ENCOUNTER — Other Ambulatory Visit: Payer: Commercial Managed Care - HMO

## 2023-10-24 ENCOUNTER — Ambulatory Visit: Payer: Commercial Managed Care - Pharmacy Benefit Manager

## 2023-10-24 ENCOUNTER — Ambulatory Visit: Payer: PRIVATE HEALTH INSURANCE

## 2023-10-24 DIAGNOSIS — Z950 Presence of cardiac pacemaker: Secondary | ICD-10-CM

## 2023-10-24 DIAGNOSIS — M1000 Idiopathic gout, unspecified site: Secondary | ICD-10-CM

## 2023-10-24 DIAGNOSIS — D649 Anemia, unspecified: Secondary | ICD-10-CM

## 2023-10-24 DIAGNOSIS — Z23 Encounter for immunization: Secondary | ICD-10-CM

## 2023-10-24 DIAGNOSIS — K922 Gastrointestinal hemorrhage, unspecified: Secondary | ICD-10-CM

## 2023-10-24 DIAGNOSIS — R269 Unspecified abnormalities of gait and mobility: Secondary | ICD-10-CM

## 2023-10-24 DIAGNOSIS — M80059A Age-related osteoporosis with current pathological fracture, unspecified femur, initial encounter for fracture: Secondary | ICD-10-CM

## 2023-10-24 DIAGNOSIS — R2689 Other abnormalities of gait and mobility: Secondary | ICD-10-CM

## 2023-10-24 DIAGNOSIS — I639 Cerebral infarction, unspecified: Secondary | ICD-10-CM

## 2023-10-24 DIAGNOSIS — I69398 Other sequelae of cerebral infarction: Secondary | ICD-10-CM

## 2023-10-24 DIAGNOSIS — Z0000 Encounter for general adult medical examination without abnormal findings: Principal | ICD-10-CM

## 2023-10-24 DIAGNOSIS — M109 Gout, unspecified: Secondary | ICD-10-CM

## 2023-10-24 DIAGNOSIS — I10 Essential (primary) hypertension: Secondary | ICD-10-CM

## 2023-10-24 MED ORDER — OYSTER CALCIUM 1250 (500 CA) MG PO TABS
1250 mg | ORAL_TABLET | Freq: Every day | ORAL | 2 refills | 38.00000 days | Status: AC
Start: 2023-10-24 — End: ?

## 2023-10-24 NOTE — H&P
 [facility] Medicare Annual Wellness Visit    PATIENT:  Andrew [name] MRN:  [id number]   DOB:  1955-07-27 DATE:  10/24/2023     Chief Complaint   Patient presents with    Annual Exam     Leaving country on 11/02/23 -      Subjective Andrew [name] is a 68 y.o. male here for a Subsequent Annual Wellness Visit and a comprehensive review of chronic medical conditions.      Initial visit with me 01/25/22:  #Hx aortic valve replacement  - followed regularly by Dr. Di here. Per recent note:   He continues to do well from cardiovascular perspective. More specifically he denies of having any chest pain, shortness of breath, orthopnea or paroxysmal nocturnal dyspnea. Furthermore, he denies of having any palpitations, LE edema or lightheadedness.   Can walk a little bit a walker.   BP has been in the 100 range.        #Hx gout  - no recent episodes since 2013  - requests allopurinol  refill      #Hx stroke in 2013  - taking asa 81 daily and atorva. Reports adherence to all meds     #Elevated a1c pre-diabetes  - diet includes white rice, sometimes brown rice, breads, pasta, no soda, no candies  - red wine 4-7 drinks per week  - exercise limited by poor balance and LE weakness residual from stroke. Used to walk in park but not so much lately, only 1-2x per week  - declines further PT at this time, doing exercises on his own regularly every week, states commitment to increase frequency     #Hx low bone density on DEXA at OSH ~10 years ago after presenting for broken rib  - started on Vit D and Ca but did not have any follow up imaging. No falls or other fractures since then      [facility]  - amenable to flu vaccine today. Declines pcv, shingles, tdap, or covid vaccines today because he doesn't want to get multiple shots in one day. Amenable to receive these vaccines at pharmacy        Interval 07/20/22:   - DEXA 10/26 with osteoporosis of hip and L fem  - appt w cardiology 05/04/22: Cont ASA, atorva, coreg , amlo, losartan ; serial imaging for bicuspid aortic valve, EKG with stable STE  - No recent falls  - Fractures: None recent. In 2016 broke rib. Carrying heavy pot. No more rib pain. Was told it would heal by itself. Got DEXA at the time showing osteoporosis and was started on fosamax, but stopped after ~1 year for unclear reason. Pt believes they were told they don't need to be taking it anymore.   - Seen dentist earlier this month, was told he had cavities and he needs fillings. Planning to get filling in June after trip to [place] and will follow up here to start fosamax after that   #HCM   - Amenable to Pneumococcal vax and Shingrix today, as well as US  abd for aneurysm.   - Smoked previously but quit 25 years ago, started age 68.   #PreDM  - Eating brown rice, adjusting diet, trying to be more active. No soda, moderate sweets and fruit intake. Wants to revisit medical therapy at next visit but try lifestyle mod for now   Trip to [place] soon. Does not need refills.        Interval 10/24/23:  - Hospitalized 08/2023 for tachy-brady syndrome, s/p PPM  09/14/23. Started on apix  - After discharge developed melena, readmitted 09/2023 and fth antral ulcerations on EGD, s/p hemoclips x2 on 09/17/2023. Discharged 6/21 and then readmitted for recurrent melena 6/22, repeat EGD s/p Ovesco clip in antrum and s/p 1u pRBC post-procedure. Discharged with stable hgb, no melena, restarted on home eliquis .   - Saw cardiologist 10/09/23 - cont yearly echo for AS s/p AVR 2013, cont coreg , amlo, losartan . Persistent inferior ST elevations.   - Saw GI 10/14/23 - continue PPI BID 6-8 weeks, repeat EGD pending  - Saw EP 10/16/23 - cont amio, apix. PPM check q67mo. 80% A-paced  - Today, reports feeling 'good', no acute complaints  - gets tired easily when walking - can only walk about 10 feet before getting tired, walks with walker, needs to stop due to SOB  - he has always walked with walker or used wheelchair since stroke in 2013. Thinks balance is worse recently, had fall May this year and fractured ankle. Gets home PT, which he is doing now as well as OT.   - now living at home with wife  - traveling to [place] next month, returning in September   - no alcohol use currently, no smoking currently. Quit smoking 25 years ago.   - pending sleep consult tomorrow      Review of Systems: Pertinent positives and negatives are included in HPI.    Other Physicians and Providers of Care:  Patient Care Team:  Andrew Francisco, MD as PCP - General (Internal Medicine)  Pcp, Unassigned Generic Andrew [name] as PCP - Plan Assigned  Elder-Piper, Andrew [name] as Ambulatory Care Coordinator  Collapsible Section below includes relevant history and patient questionnaire responses.   (click to expand/collapse)    Meds     Current Outpatient Medications   Medication Instructions    allopurinol  (ZYLOPRIM ) 200 mg, Oral, Daily    amiodarone  200 mg, Oral, Daily    amoxicillin 500 mg capsule Use as directed prior to dental appointment    apixaban  (ELIQUIS ) 5 mg, Oral, 2 times daily    ascorbic acid  500 mg, Daily    atorvastatin  (LIPITOR) 20 mg, Oral, Nightly    calcium  carbonate 1250 mg (500 mg elemental) 1,250 mg, Oral, Daily with breakfast    metoprolol  tartrate (LOPRESSOR ) 50 mg, Oral, 2 times daily    Multiple Vitamins-Minerals (HCA SUPER THERAVITE-M PO) 1 tablet, Daily    omeprazole  (PRILOSEC) 20 mg, Oral, 2 times daily    vitamin D (cholecalciferol ) 25 mcg, Daily     Review of opioid use: Not applicable, not on opioids  Problem List     Patient Active Problem List   Diagnosis    Abnormality of gait    Hx of medication noncompliance    Muscle weakness (generalized)    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)    Hypertension    Gout    Seborrheic dermatitis    Urinary frequency    H/O aortic valve replacement    Bicuspid aortic valve    Age related osteoporosis    Prediabetes    H/O bicuspid aortic valve    H/O: CVA (cerebrovascular accident)    AF (paroxysmal atrial fibrillation) (HCC/RAF)    Tachy-brady syndrome (HCC/RAF)    Cardiac pacemaker in situ      Past Medical History     Past Medical History:   Diagnosis Date    Antral ulcer 09/2023    CVA (cerebral vascular accident) (HCC/RAF)     H/O aortic  valve replacement     Kidney stone     Urinary frequency      Past Surgical History     Past Surgical History:   Procedure Laterality Date    AORTIC VALVE REPLACEMENT      CARDIAC PACEMAKER PLACEMENT  09/2023    ESOPHAGOGASTRODUODENOSCOPY  09/2023        Family History     Family History   Problem Relation Age of Onset    No Known Problems Mother     Other (gastric ulcer) Father     Uterine cancer Sister     Stroke Brother     Kidney failure Brother     Colon cancer Neg Hx     Lung cancer Neg Hx     Prostate cancer Neg Hx      Social History - Link to Health Risk Assessment     Social History     Socioeconomic History    Marital status: Married   Tobacco Use    Smoking status: Former     Current packs/day: 0.00     Average packs/day: 1 pack/day for 15.0 years (15.0 ttl pk-yrs)     Types: Cigarettes     Start date: 05/03/1975     Quit date: 05/02/1990     Years since quitting: 33.5    Smokeless tobacco: Never   Substance and Sexual Activity    Alcohol use: Not Currently     Comment: occasionally has red wine    Drug use: Not Currently     Social Drivers of Health     Financial Resource Strain: Medium Risk (09/23/2023)    Financial Resource Strain     Difficulty of Paying Living Expenses: Somewhat hard          No data to display               Allergies   No Known Allergies  Health Maintenance     Health Maintenance   Topic Date Due    Shingles (Shingrix) Vaccine (1 of 2) Never done    Advance Directive  Never done    COVID-19 Vaccine(Tracks primary and booster doses, not sup/immunocomp) (4 - 2024-25 season) 12/02/2022    Colorectal Cancer Screening  07/16/2023    Influenza Vaccine (1) 12/02/2023    Preventive Wellness Visit  10/23/2024    Prediabetes Screening (See hover text) 10/19/2026    Tdap/Td Vaccine (3 - Td or Tdap) 07/19/2032    Hepatitis B Screening  Completed    Pneumococcal Vaccine  Completed    Hepatitis C Screening  Completed    AAA Screening  Completed    Statin prescribed for ASCVD Prevention or Treatment  Completed      Immunizations     Immunization History   Administered Date(s) Administered    COVID-19, mRNA, (Moderna) 100 mcg/0.5 mL 06/10/2019, 07/08/2019, 02/04/2020    Hepatitis A, Adult 09/29/2012    Influenza vaccine IM quadrivalent (Afluria Quad) (PF) SYR (46 years of age and older) 11/18/2015, 11/20/2016, 12/20/2017, 12/09/2019    Tdap 05/25/2011, 07/20/2022    influenza vaccine IM cell culture quadrivalent (Flucelvax QUAD) (PF) SYR (57 months of age and older) 02/11/2019    influenza vaccine IM quadrivalent (Fluzone Quad) MDV (70 months of age and older) 01/06/2015    influenza vaccine IM quadrivalent high dose (Fluzone High Dose Quad) (PF) SYR (4 years of age and older) 01/15/2021, 01/25/2022    influenza vaccine IM trivalent high dose (Fluzone High Dose) (PF)  SYR (25 years of age and older) 12/15/2022    influenza, unspecified formulation 01/10/2008, 12/31/2009, 04/03/2011, 12/18/2011, 03/06/2014, 03/06/2014, 01/06/2015, 11/18/2015, 11/20/2016, 12/20/2017, 12/09/2019    pneumococcal conjugate vaccine 20-valent (Prevnar 20) 07/20/2022    pneumococcal polysaccharide vaccine 23-valent (Pneumovax) 05/25/2011        Objective BP 125/77  ~ Pulse 62  ~ Temp 36.7 ?C (98 ?F) (Forehead)  ~ Resp 17  ~ Ht 5' 6'' (1.676 m)  ~ Wt 188 lb (85.3 kg)  ~ SpO2 97% Comment: RA ~ BMI 30.34 kg/m?  Body mass index is 30.34 kg/m?SABRA  Prior Vitals   Wt Readings from Last 10 Encounters:   10/24/23 188 lb (85.3 kg)   10/16/23 190 lb (86.2 kg)   10/14/23 190 lb (86.2 kg)   10/09/23 185 lb (83.9 kg)   10/02/23 190 lb (86.2 kg)   09/29/23 196 lb 3.4 oz (89 kg)   09/20/23 190 lb 4.1 oz (86.3 kg)   08/09/23 202 lb (91.6 kg)   08/05/23 202 lb (91.6 kg)   07/30/23 190 lb (86.2 kg)             BMI Readings from Last 1 Encounters:   10/16/23 30.67 kg/m?      BP Trend:       BP Readings from Last 3 Encounters:   10/16/23 125/79   10/14/23 144/95   10/14/23 119/83            System Check if Normal Positive or additional negative findings   Constit  [x]  General appearance     Eyes  [x]  Conj/Lids [x]  Pupils  []  Fundi     ENMT  [x]  External ears/nose []  Otoscopy   [x]  Hearing []  Nasal mucosa   [x]  Lips/teeth/gums []  Oropharynx     Neck  [x]  Inspection/palpation []  Thyroid     Resp  [x]  Normal effort [x]  No Wheezing    [x]  Auscultation  [x] No Crackles     CV  [x]  RRR   [x]  No  Murmurs  [x]  No Edema   [x]  JVP non-elevated    Normal pulses:   []  Abd aorta []  Femoral  []  Pedal     Breast  []  Inspection []  Palpation breast and axilla     GI  [x]  No abd masses    [x]  No tenderness   [x]  No Liver/spleen enlargement    []  Rectal-no masses     GU  M: []  Scrotum []  Penis []  Prostate   F:  []  External []  Bladder []  Cervix         []  Uterus    []  Adnexa      Lymph  []  Neck []  Axillae []  Groin     MS Specify site examined:    [x]  Inspect/palp []  ROM   []  Stability []  Strength/tone         Skin  [x]  Inspection []  Palpation     Neuro  [x]  CN2-12 intact    [x]  Oriented x 3   []  Motor strength    []  Sensory intact to light touch   []  Patellar DTR  []  Brachial DTR          []  Normal Gait  L leg weakness 4/5 chronic on knee flexion, extension, ankle flexion and extension.   Unstable gait   Psych  [x]  Insight/judgement     [x]  Mood/affect           Functional Ability/Safety Screening  Was the patient's timed UP&GO test  unsteady or longer than 30 seconds? Yes  Results of hearing evaluation: Able to hear whisper or finger-rub at 6 feet. Yes  Collapsible Section below includes Behavioral Health Screenings, Labs, and Imaging    (click to expand/collapse)    Evaluation of Cognitive Function: Link to Complete Mini-Cog if Indicated  Mood/affect: Oriented to time, place, person  Judgement: intact     Behavioral Health Screen (PHQ-9 ~ GAD-7 ~ DAST-10 ~ Audit C Score Review)  PHQ-9 Total Score Total Score   10/24/2023 10     GAD-7 Total Score Total Score   10/24/2023 12     DAST-10 Total Score Total Score   10/24/2023 0     Audit-C Total Score Total Score   10/24/2023 2     Labs Relevant labs reviewed.   Latest hbg increasing to 10.1 but still below prior baseline.   Last A1c 6.4 in 05/2023  Last lipid panel wnl.     Lab Studies:  CBC:         Results for orders placed or performed in visit on 10/19/23   CBC   Result Value Ref Range     White Blood Cell Count 4.42 4.16 - 9.95 x10E3/uL     Red Blood Cell Count 3.85 (L) 4.41 - 5.95 x10E6/uL     Hemoglobin 10.1 (L) 13.5 - 17.1 g/dL     Hematocrit 67.8 (L) 38.5 - 52.0 %     Mean Corpuscular Volume 83.4 79.3 - 98.6 fL     Mean Corpuscular Hemoglobin 26.2 (L) 26.4 - 33.4 pg     MCH Concentration 31.5 31.5 - 35.5 g/dL     Red Cell Distribution Width-SD 45.6 36.9 - 48.3 fL     Red Cell Distribution Width-CV 15.0 11.1 - 15.5 %     Platelet Count, Auto 240 143 - 398 x10E3/uL     Mean Platelet Volume 10.0 9.3 - 13.0 fL     Nucleated RBC%, automated 0.0 No Ref. Range %     Absolute Nucleated RBC Count 0.00 0.00 - 0.00 x10E3/uL     Neutrophil Abs (Prelim) 2.72 See Absolute Neut Ct. x10E3/uL   Differential, Automated   Result Value Ref Range     Neutrophil Percent, Auto 61.5 No Ref. Range %     Lymphocyte Percent, Auto 24.0 No Ref. Range %     Monocyte Percent, Auto 8.4 No Ref. Range %     Eosinophil Percent, Auto 5.2 No Ref. Range %     Basophil Percent, Auto 0.7 No Ref. Range %     Immature Granulocytes% 0.2 No Reference Range %     Absolute Neut Count 2.72 1.80 - 6.90 x10E3/uL     Absolute Lymphocyte Count 1.06 (L) 1.30 - 3.40 x10E3/uL     Absolute Mono Count 0.37 0.20 - 0.80 x10E3/uL     Absolute Eos Count 0.23 0.00 - 0.50 x10E3/uL     Absolute Baso Count 0.03 0.00 - 0.10 x10E3/uL     Absolute Immature Gran Count 0.01 0.00 - 0.04 x10E3/uL   CBC & Auto Differential     Narrative     The following orders were created for panel order CBC & Auto Differential.  Procedure                               Abnormality         Status                     ---------                               -----------         ------  RAR[[id number]]                          Abnormal            Final result               Differential, Automated[[id number]]      Abnormal            Final result                  Please view results for these tests on the individual orders.            Results for orders placed or performed during the hospital encounter of 09/22/23   CBC   Result Value Ref Range     White Blood Cell Count 6.29 4.16 - 9.95 x10E3/uL     Red Blood Cell Count 2.98 (L) 4.41 - 5.95 x10E6/uL     Hemoglobin 8.6 (L) 13.5 - 17.1 g/dL     Hematocrit 73.2 (L) 38.5 - 52.0 %     Mean Corpuscular Volume 89.6 79.3 - 98.6 fL     Mean Corpuscular Hemoglobin 28.9 26.4 - 33.4 pg     MCH Concentration 32.2 31.5 - 35.5 g/dL     Red Cell Distribution Width-SD 46.1 36.9 - 48.3 fL     Red Cell Distribution Width-CV 14.5 11.1 - 15.5 %     Platelet Count, Auto 241 143 - 398 x10E3/uL     Mean Platelet Volume 10.2 9.3 - 13.0 fL     Nucleated RBC%, automated 0.0 No Ref. Range %     Absolute Nucleated RBC Count 0.00 0.00 - 0.00 x10E3/uL      CMP:         Results for orders placed or performed during the hospital encounter of 09/09/23   Comprehensive Metabolic Panel   Result Value Ref Range     Sodium 141 135 - 146 mmol/L     Potassium 4.0 3.6 - 5.3 mmol/L     Chloride 105 96 - 106 mmol/L     Total CO2 25 20 - 30 mmol/L     Anion Gap 11 8 - 19 mmol/L     Glucose 101 (H) 65 - 99 mg/dL     Creatinine 9.27 9.39 - 1.30 mg/dL     Estimated GFR >10 See GFR Additional Information mL/min/1.49m2     GFR Additional Information See Comment       Urea Nitrogen 11 7 - 22 mg/dL     Calcium  8.5 (L) 8.6 - 10.4 mg/dL     Total Protein 6.5 6.1 - 8.2 g/dL     Albumin 3.5 (L) 3.9 - 5.0 g/dL     Bilirubin,Total 0.5 0.1 - 1.2 mg/dL     Alkaline Phosphatase 66 37 - 133 U/L Aspartate Aminotransferase 22 13 - 62 U/L     Alanine Aminotransferase 38 8 - 70 U/L      Last LFTs:         Results for orders placed or performed during the hospital encounter of 09/22/23   Hepatic Funct Panel   Result Value Ref Range     Total Protein 5.4 (L) 6.1 - 8.2 g/dL     Albumin 3.2 (L) 3.9 - 5.0 g/dL     Bilirubin,Total 0.4 0.1 - 1.2 mg/dL     Bilirubin,Conjugated <0.2 <=0.3 mg/dL     Alkaline Phosphatase 47 37 - 133 U/L  Aspartate Aminotransferase 19 13 - 62 U/L     Alanine Aminotransferase 27 8 - 70 U/L      BMP:         Results for orders placed or performed in visit on 10/19/23   Basic Metabolic Panel   Result Value Ref Range     Sodium 143 135 - 146 mmol/L     Potassium 4.1 3.6 - 5.3 mmol/L     Chloride 106 96 - 106 mmol/L     Total CO2 24 20 - 30 mmol/L     Anion Gap 13 8 - 19 mmol/L     Glucose 104 (H) 65 - 99 mg/dL     Creatinine 9.11 9.39 - 1.30 mg/dL     Estimated GFR >10 See GFR Additional Information mL/min/1.60m2     GFR Additional Information See Comment       Urea Nitrogen 14 7 - 22 mg/dL     Calcium  9.3 8.6 - 10.4 mg/dL      Hgb J8R:         Lab Results   Component Value Date/Time     HGBA1C 6.4 (H) 05/25/2023 09:19 AM      Lipids:         Results for orders placed or performed in visit on 05/25/23   Lipid Panel   Result Value Ref Range     Cholesterol 123 See Comment mg/dL     Cholesterol,LDL,Calc 68 <100 mg/dL     Cholesterol, HDL 38 (L) >40 mg/dL     Triglycerides 84 <849 mg/dL     Non-HDL,Chol,Calc 85 <130 mg/dL      Vit-D:         Lab Results   Component Value Date/Time     [facility] 39 05/25/2023 09:19 AM       Imaging & Procedures     Radiology Relevant imaging reviewed.  Imaging Studies:   Last DXA:       Results for orders placed or performed in visit on 03/29/22   DXA lumbar spine+hip     Narrative     =================================================================                         Bone Density Report =================================================================     Name:          ALEXXANDER, KURT  Patient ID:    [id number]  Age:           25  Sex:           Male  Ethnicity:     Asian  Date of Birth: Mar 14, 1956      -----------------------------------------------------------------     Indication: Prior fracture;     Referring Provider: MECHELE, DAVID P.     Study: Bone densitometry was performed.     Exam Date: March 29, 2022     Accession number: [id number]        Bone Density:  -----------------------------------------------------------------  Region                   BMD    T-score  Z-score   Classification  -----------------------------------------------------------------  AP Spine(L1-L4)          0.884   -1.9     -1.1       Osteopenia  Femoral Neck (Left)      0.548   -2.8     -1.7       Osteoporosis  Total Hip (Left)  0.654   -2.5     -2.0       Osteoporosis  -----------------------------------------------------------------     World Health Organization criteria for BMD impression   classify patients as:   Normal (T-score at or above -1.0),   Osteopenia (T-score between -1.0 and -2.5), or   Osteoporosis (T-score at or below -2.5).      10-year Fracture Risk:  -----------------------------------------------------------------  FRAX not reported because:    Some T-score for Spine Total or Hip Total or Femoral Neck at   or below -2.5     Prior hip or vertebral fracture  -----------------------------------------------------------------           Impression: The patient has established osteoporosis, based on   the Left Femoral Neck T-score and the existence of a prior   fracture. The patient has risk factors, including: previous   fracture.         Reported by: Clem Barth on 03/29/2022 4:37:00 PM.     Last CTA abd 09/22/23  1.  No evidence of mesenteric ischemia and no definite active contrast extravasation. Minimal high density material in the gastric antrum only seen on arterial phase imaging which does not persist on venous phase likely redistribution of existing hyperdense material in the gastric lumen. No acute CT abnormality in the abdomen or pelvis.  2.  Uncomplicated cholelithiasis.    Last TTE  CONCLUSIONS   1. Technically very difficult study with suboptimal views.   2. Small LV size and severe concentric left ventricular hypertrophy.   3. Hyperdynamic LV systolic function. Normal left ventricular regional wall motion. Left ventricular ejection fraction is approximately >75%.   4. Mild LV diastolic dysfunction (grade I, impaired relaxation).   5. Normal right ventricular size. Normal RV systolic function.   6. A 25 mm Carpentier bioprosthetic valve is present in the aortic valve position. Echo findings are consistent with normal structure and function of the aortic prosthesis.   7. A prior echo performed on 08/08/2023 was reviewed for comparison. No significant changes noted since the previous study.         Assessment & Plan    1. Medicare annual wellness visit, subsequent  TBOC - PHQ-9 (Monitor) Assessment    TBOC GAD-7 Behavioral Health Assessment    TBOC DAST Behavioral Health Assessment    TBOC Audit-C Behavioral Health Assessment    CBC      2. Gait abnormality  Referral to Rehabilitation, Occupational Therapy    Referral to [facility], Physical Therapy      3. Imbalance due to old stroke        4. Pacemaker        5. Upper GI bleed  CBC    Iron & Iron Binding Capacity    Ferritin      6. Idiopathic gout, unspecified chronicity, unspecified site        7. Cerebrovascular accident (CVA), unspecified mechanism (HCC/RAF)  Referral to Rehabilitation, Occupational Therapy    Referral to [facility], Physical Therapy      8. Hypertension, unspecified type        9. Prediabetes        10. Need for zoster vaccination  Zoster Vaccine (Shingrix) (order to administer in-clinic) [SECOND IN SERIES, FUTURE 2-6 MONTHS]    CANCELED: Zoster Vaccine (Shingrix) (order to administer in-clinic) [SINGLE ORDER, FOR ADMIN TODAY IN-OFFICE]      11. Gout involving toe, unspecified cause, unspecified chronicity, unspecified laterality  Uric Acid  12. Age-related osteoporosis with current pathological fracture of femur, unspecified laterality, initial encounter (HCC/RAF)  eConsult to Osteporosis & Osteopenia (ZRNW62) - Adult        Cline Draheim is a 68 y.o. male here for Subsequent Annual Wellness Visit and review of chronic medical conditions.      Diagnoses and all orders for this visit:    Medicare annual wellness visit, subsequent  -     TBOC - PHQ-9 (Monitor) Assessment  -     TBOC GAD-7 Behavioral Health Assessment  -     TBOC DAST Behavioral Health Assessment  -     TBOC Audit-C Behavioral Health Assessment  -     CBC; Future; Expected date: 12/25/2023    Gait abnormality  Imbalance due to old stroke  -     Referral to Rehabilitation, Occupational Therapy  -     Referral to [facility], Physical Therapy    Anemia, normocytic  Upper GI bleed  Added on iron labs today. Will pursue iron repletion if persistent IDA.   -     CBC; Future; Expected date: 12/25/2023  -     Iron & Iron Binding Capacity; Future  -     Ferritin; Future    Cerebrovascular accident (CVA), unspecified mechanism (HCC/RAF)  -     Referral to Rehabilitation, Occupational Therapy  -     Referral to [facility], Physical Therapy    Hypertension, unspecified type         -     Well-controlled currently, ctm    Prediabetes        -     f/u future a1c    Need for zoster vaccination  -     Cancel: Zoster Vaccine (Shingrix) (order to administer in-clinic) [SINGLE ORDER, FOR ADMIN TODAY IN-OFFICE]  -     Zoster Vaccine (Shingrix) (order to administer in-clinic) [SECOND IN SERIES, FUTURE 2-6 MONTHS]; Future; Expected date: 12/23/2023    Idiopathic gout, unspecified chronicity, unspecified site  Gout involving toe, unspecified cause, unspecified chronicity, unspecified laterality  -     Uric Acid; Future    Age-related osteoporosis with current pathological fracture of femur, unspecified laterality, initial encounter (HCC/RAF)  -     eConsult to Osteporosis & Osteopenia (ZRNW62) - Adult  -     Oyster Shell (CALCIUM  CARBONATE 1250 MG, 500 MG ELEMENTAL,) 1250 mg tablet; Take 1 tablet (1,250 mg total) by mouth daily with breakfast.        Advice/Referrals:   Follow up for Annual Wellness Visit--at least 12 months since this AWV Examination      The additional diagnoses assessed at this visit constituted a separate, identifiable service from the routine preventive health examination.    The above plan of care, diagnosis, orders, and follow-up were discussed with the patient.  Questions related to this recommended plan of care were answered.    Discussed with attending physician, Dr. Isa, who agrees with the assessment and plan above.    Stasia Ewing, MD 10/24/2023 7:58 PM

## 2023-10-24 NOTE — Progress Notes
 Internal Medicine Progress Note      Patient: Andrew [name], [id number]  DOB: 07-22-1955  Primary Care Provider: Jeana Francisco, MD  Date of Service: 10/24/2023    Chief Concern:   Chief Complaint   Patient presents with   ? Annual Exam     Leaving country on 11/02/23 -        History obtained from: [x]  patient []  family member []  caregiver  []  chart review    History of Present Illness   Shivank Pinedo is a 68 y.o. male who presents to clinic for   Chief Complaint   Patient presents with   ? Annual Exam     Leaving country on 11/02/23 -          Initial visit with me 01/25/22:  #Hx aortic valve replacement  - followed regularly by Dr. Di here. Per recent note:   He continues to do well from cardiovascular perspective. More specifically he denies of having any chest pain, shortness of breath, orthopnea or paroxysmal nocturnal dyspnea. Furthermore, he denies of having any palpitations, LE edema or lightheadedness.   Can walk a little bit a walker.   BP has been in the 100 range.        #Hx gout  - no recent episodes since 2013  - requests allopurinol  refill      #Hx stroke in 2013  - taking asa 81 daily and atorva. Reports adherence to all meds     #Elevated a1c pre-diabetes  - diet includes white rice, sometimes brown rice, breads, pasta, no soda, no candies  - red wine 4-7 drinks per week  - exercise limited by poor balance and LE weakness residual from stroke. Used to walk in park but not so much lately, only 1-2x per week  - declines further PT at this time, doing exercises on his own regularly every week, states commitment to increase frequency     #Hx low bone density on DEXA at OSH ~10 years ago after presenting for broken rib  - started on Vit D and Ca but did not have any follow up imaging. No falls or other fractures since then      [facility]  - amenable to flu vaccine today. Declines pcv, shingles, tdap, or covid vaccines today because he doesn't want to get multiple shots in one day. Amenable to receive these vaccines at pharmacy      Interval 07/20/22:   - DEXA 10/26 with osteoporosis of hip and L fem  - appt w cardiology 05/04/22: Cont ASA, atorva, coreg , amlo, losartan ; serial imaging for bicuspid aortic valve, EKG with stable STE  - No recent falls  - Fractures: None recent. In 2016 broke rib. Carrying heavy pot. No more rib pain. Was told it would heal by itself. Got DEXA at the time showing osteoporosis and was started on fosamax, but stopped after ~1 year for unclear reason. Pt believes they were told they don't need to be taking it anymore.   - Seen dentist earlier this month, was told he had cavities and he needs fillings. Planning to get filling in June after trip to [place] and will follow up here to start fosamax after that   #HCM   - Amenable to Pneumococcal vax and Shingrix today, as well as US  abd for aneurysm.   - Smoked previously but quit 25 years ago, started age 64.   #PreDM  - Eating brown rice, adjusting diet, trying to be more active. No soda, moderate  sweets and fruit intake. Wants to revisit medical therapy at next visit but try lifestyle mod for now   Trip to [place] soon. Does not need refills.      Interval 10/24/23:  - Hospitalized 08/2023 for tachy-brady syndrome, s/p PPM 09/14/23. Started on apix  - After discharge developed melena, readmitted 09/2023 and fth antral ulcerations on EGD, s/p hemoclips x2 on 09/17/2023. Discharged 6/21 and then readmitted for recurrent melena 6/22, repeat EGD s/p Ovesco clip in antrum and s/p 1u pRBC post-procedure. Discharged with stable hgb, no melena, restarted on home eliquis .   - Saw cardiologist 10/09/23 - cont yearly echo for AS s/p AVR 2013, cont coreg , amlo, losartan . Persistent inferior ST elevations.   - Saw GI 10/14/23 - continue PPI BID 6-8 weeks, repeat EGD pending  - Saw EP 10/16/23 - cont amio, apix. PPM check q33mo. 80% A-paced  - Today, reports feeling 'good', no acute complaints  - gets tired easily when walking - can only walk about 10 feet before getting tired, walks with walker, needs to stop due to SOB  - he has always walked with walker or used wheelchair since stroke in 2013. Thinks balance is worse recently, had fall May this year and fractured ankle. Gets home PT, which he is doing now as well as OT.   - now living at home with wife  - traveling to [place] next month, returning in September   - no alcohol use currently, no smoking currently. Quit smoking 25 years ago.   - pending sleep consult tomorrow      I reviewed these specialist notes that directly relate to the patient's acute and/or chronic medical problems with documentation of the salient findings, if jwb:WJWRB J LOPEZ in Medicine, Clinical Cardiac Electrophysiology on 10/16/2023.}  Review of Systems     Review of Systems   Constitutional:  Negative for chills, fever and weight loss.   HENT:  Negative for congestion and sore throat.    Respiratory:  Negative for cough and shortness of breath.    Cardiovascular:  Negative for chest pain and palpitations.   Gastrointestinal:  Negative for abdominal pain, nausea and vomiting.   Genitourinary:  Negative for dysuria and flank pain.   Musculoskeletal:  Negative for falls and neck pain.   Skin:  Negative for rash.   Neurological:  Negative for dizziness, loss of consciousness and headaches.        Problem List     Health Maintenance   (click to expand/collapse)    Patient Active Problem List   Diagnosis   ? Abnormality of gait   ? Hx of medication noncompliance   ? Muscle weakness (generalized)   ? 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 Hypertension   ? Gout   ? Seborrheic dermatitis   ? Urinary frequency   ? H/O aortic valve replacement   ? Bicuspid aortic valve   ? Age related osteoporosis   ? Prediabetes   ? H/O bicuspid aortic valve   ? H/O: CVA (cerebrovascular accident)   ? AF (paroxysmal atrial fibrillation) (HCC/RAF)   ? Tachy-brady syndrome (HCC/RAF)   ? Cardiac pacemaker in situ         Health Maintenance Topic Date Due   ? Shingles (Shingrix) Vaccine (1 of 2) Never done   ? Advance Directive  Never done   ? COVID-19 Vaccine(Tracks primary and booster doses, not sup/immunocomp) (4 - 2024-25 season) 12/02/2022   ? Colorectal Cancer Screening  07/16/2023   ?  Influenza Vaccine (1) 12/02/2023   ? Preventive Wellness Visit  10/23/2024   ? Prediabetes Screening (See hover text)  10/19/2026   ? Tdap/Td Vaccine (3 - Td or Tdap) 07/19/2032   ? Hepatitis B Screening  Completed   ? Pneumococcal Vaccine  Completed   ? Hepatitis C Screening  Completed   ? AAA Screening  Completed   ? Statin prescribed for ASCVD Prevention or Treatment  Completed         Home Medications   The below medication list was reconciled at this visit.   Medications that the patient states to be currently taking   Medication Sig   ? ALLOPURINOL  100 mg tablet TAKE 2 TABLETS BY MOUTH EVERY DAY   ? amiodarone  200 mg tablet Take 1 tablet (200 mg total) by mouth daily.   ? amoxicillin 500 mg capsule Use as directed prior to dental appointment   ? apixaban  5 mg tablet Take 1 tablet (5 mg total) by mouth two (2) times daily.   ? ascorbic acid  500 mg tablet Take 1 tablet (500 mg total) by mouth daily.   ? ATORVASTATIN  20 mg tablet TAKE 1 TABLET BY MOUTH EVERYDAY AT BEDTIME   ? metoprolol  tartrate 50 mg tablet Take 1 tablet (50 mg total) by mouth two (2) times daily.   ? Multiple Vitamins-Minerals (HCA SUPER THERAVITE-M PO) Take 1 tablet by mouth daily.   ? omeprazole  20 mg DR capsule Take 1 capsule (20 mg total) by mouth two (2) times daily.   ? vitamin D, cholecalciferol , 25 mcg (1000 units) tablet Take 1 tablet (25 mcg total) by mouth daily.   ? [DISCONTINUED] CALCIUM  PO Take 600 mg by mouth two (2) times daily.       Allergy  No Known Allergies    Physical Exam     Patient informed of chaperone program: Patient/Legal Guardian Declined    Vitals  BP 125/77  ~ Pulse 62  ~ Temp 36.7 ?C (98 ?F) (Forehead)  ~ Resp 17  ~ Ht 5' 6'' (1.676 m)  ~ Wt 188 lb (85.3 kg) ~ SpO2 97% Comment: RA ~ BMI 30.34 kg/m?   Weight:   Wt Readings from Last 10 Encounters:   10/24/23 188 lb (85.3 kg)   10/16/23 190 lb (86.2 kg)   10/14/23 190 lb (86.2 kg)   10/09/23 185 lb (83.9 kg)   10/02/23 190 lb (86.2 kg)   09/29/23 196 lb 3.4 oz (89 kg)   09/20/23 190 lb 4.1 oz (86.3 kg)   08/09/23 202 lb (91.6 kg)   08/05/23 202 lb (91.6 kg)   07/30/23 190 lb (86.2 kg)        BMI Readings from Last 1 Encounters:   10/24/23 30.34 kg/m?     BP Trend:   BP Readings from Last 3 Encounters:   10/24/23 125/77   10/16/23 125/79   10/14/23 144/95         System Check if Normal Positive or additional negative findings   Constit  [x]  General appearance     Eyes  [x]  Conj/Lids [x]  Pupils  []  Fundi     ENMT  [x]  External ears/nose []  Otoscopy   [x]  Hearing []  Nasal mucosa   [x]  Lips/teeth/gums []  Oropharynx     Neck  [x]  Inspection/palpation []  Thyroid     Resp  [x]  Normal effort [x]  No Wheezing    []  Auscultation  [] No Crackles     CV  [x]  RRR   [  x] No  Murmurs  [x]  No Edema   [x]  JVP non-elevated    Normal pulses:   []  Abd aorta []  Femoral  []  Pedal     Breast  []  Inspection []  Palpation breast and axilla     GI  [x]  No abd masses    [x]  No tenderness   [x]  No Liver/spleen enlargement    []  Rectal-no masses     GU  M: []  Scrotum []  Penis []  Prostate   F:  []  External []  Bladder []  Cervix         []  Uterus    []  Adnexa      Lymph  []  Neck []  Axillae []  Groin     MS Specify site examined:    [x]  Inspect/palp []  ROM   []  Stability []  Strength/tone         Skin  [x]  Inspection []  Palpation     Neuro  [x]  CN2-12 intact    [x]  Oriented x 3   []  Motor strength    []  Sensory intact to light touch   []  Patellar DTR  []  Brachial DTR          []  Normal Gait  L leg weakness 4/5 chronic on knee flexion, extension, ankle flexion and extension.   Unstable on standing   Psych  [x]  Insight/judgement     [x]  Mood/affect           Laboratory Data/ Imaging      (click to expand/collapse)    Lab Studies:  CBC:   Results for orders placed or performed in visit on 10/19/23   CBC   Result Value Ref Range    White Blood Cell Count 4.42 4.16 - 9.95 x10E3/uL    Red Blood Cell Count 3.85 (L) 4.41 - 5.95 x10E6/uL    Hemoglobin 10.1 (L) 13.5 - 17.1 g/dL    Hematocrit 67.8 (L) 38.5 - 52.0 %    Mean Corpuscular Volume 83.4 79.3 - 98.6 fL    Mean Corpuscular Hemoglobin 26.2 (L) 26.4 - 33.4 pg    MCH Concentration 31.5 31.5 - 35.5 g/dL    Red Cell Distribution Width-SD 45.6 36.9 - 48.3 fL    Red Cell Distribution Width-CV 15.0 11.1 - 15.5 %    Platelet Count, Auto 240 143 - 398 x10E3/uL    Mean Platelet Volume 10.0 9.3 - 13.0 fL    Nucleated RBC%, automated 0.0 No Ref. Range %    Absolute Nucleated RBC Count 0.00 0.00 - 0.00 x10E3/uL    Neutrophil Abs (Prelim) 2.72 See Absolute Neut Ct. x10E3/uL   Differential, Automated   Result Value Ref Range    Neutrophil Percent, Auto 61.5 No Ref. Range %    Lymphocyte Percent, Auto 24.0 No Ref. Range %    Monocyte Percent, Auto 8.4 No Ref. Range %    Eosinophil Percent, Auto 5.2 No Ref. Range %    Basophil Percent, Auto 0.7 No Ref. Range %    Immature Granulocytes% 0.2 No Reference Range %    Absolute Neut Count 2.72 1.80 - 6.90 x10E3/uL    Absolute Lymphocyte Count 1.06 (L) 1.30 - 3.40 x10E3/uL    Absolute Mono Count 0.37 0.20 - 0.80 x10E3/uL    Absolute Eos Count 0.23 0.00 - 0.50 x10E3/uL    Absolute Baso Count 0.03 0.00 - 0.10 x10E3/uL    Absolute Immature Gran Count 0.01 0.00 - 0.04 x10E3/uL   CBC & Auto Differential    Narrative  The following orders were created for panel order CBC & Auto Differential.  Procedure                               Abnormality         Status                     ---------                               -----------         ------                     RAR[[id number]]                          Abnormal            Final result               Differential, Automated[[id number]]      Abnormal            Final result                 Please view results for these tests on the individual orders. Results for orders placed or performed during the hospital encounter of 09/22/23   CBC   Result Value Ref Range    White Blood Cell Count 6.29 4.16 - 9.95 x10E3/uL    Red Blood Cell Count 2.98 (L) 4.41 - 5.95 x10E6/uL    Hemoglobin 8.6 (L) 13.5 - 17.1 g/dL    Hematocrit 73.2 (L) 38.5 - 52.0 %    Mean Corpuscular Volume 89.6 79.3 - 98.6 fL    Mean Corpuscular Hemoglobin 28.9 26.4 - 33.4 pg    MCH Concentration 32.2 31.5 - 35.5 g/dL    Red Cell Distribution Width-SD 46.1 36.9 - 48.3 fL    Red Cell Distribution Width-CV 14.5 11.1 - 15.5 %    Platelet Count, Auto 241 143 - 398 x10E3/uL    Mean Platelet Volume 10.2 9.3 - 13.0 fL    Nucleated RBC%, automated 0.0 No Ref. Range %    Absolute Nucleated RBC Count 0.00 0.00 - 0.00 x10E3/uL     CMP:   Results for orders placed or performed during the hospital encounter of 09/09/23   Comprehensive Metabolic Panel   Result Value Ref Range    Sodium 141 135 - 146 mmol/L    Potassium 4.0 3.6 - 5.3 mmol/L    Chloride 105 96 - 106 mmol/L    Total CO2 25 20 - 30 mmol/L    Anion Gap 11 8 - 19 mmol/L    Glucose 101 (H) 65 - 99 mg/dL    Creatinine 9.27 9.39 - 1.30 mg/dL    Estimated GFR >10 See GFR Additional Information mL/min/1.36m2    GFR Additional Information See Comment     Urea Nitrogen 11 7 - 22 mg/dL    Calcium  8.5 (L) 8.6 - 10.4 mg/dL    Total Protein 6.5 6.1 - 8.2 g/dL    Albumin 3.5 (L) 3.9 - 5.0 g/dL    Bilirubin,Total 0.5 0.1 - 1.2 mg/dL    Alkaline Phosphatase 66 37 - 133 U/L    Aspartate Aminotransferase 22 13 - 62 U/L    Alanine Aminotransferase 38 8 - 70 U/L  Last LFTs:   Results for orders placed or performed during the hospital encounter of 09/22/23   Hepatic Funct Panel   Result Value Ref Range    Total Protein 5.4 (L) 6.1 - 8.2 g/dL    Albumin 3.2 (L) 3.9 - 5.0 g/dL    Bilirubin,Total 0.4 0.1 - 1.2 mg/dL    Bilirubin,Conjugated <0.2 <=0.3 mg/dL    Alkaline Phosphatase 47 37 - 133 U/L    Aspartate Aminotransferase 19 13 - 62 U/L    Alanine Aminotransferase 27 8 - 70 U/L      BMP:   Results for orders placed or performed in visit on 10/19/23   Basic Metabolic Panel   Result Value Ref Range    Sodium 143 135 - 146 mmol/L    Potassium 4.1 3.6 - 5.3 mmol/L    Chloride 106 96 - 106 mmol/L    Total CO2 24 20 - 30 mmol/L    Anion Gap 13 8 - 19 mmol/L    Glucose 104 (H) 65 - 99 mg/dL    Creatinine 9.11 9.39 - 1.30 mg/dL    Estimated GFR >10 See GFR Additional Information mL/min/1.16m2    GFR Additional Information See Comment     Urea Nitrogen 14 7 - 22 mg/dL    Calcium  9.3 8.6 - 10.4 mg/dL     Hgb J8R:   Lab Results   Component Value Date/Time    HGBA1C 6.4 (H) 05/25/2023 09:19 AM     Lipids:   Results for orders placed or performed in visit on 05/25/23   Lipid Panel   Result Value Ref Range    Cholesterol 123 See Comment mg/dL    Cholesterol,LDL,Calc 68 <100 mg/dL    Cholesterol, HDL 38 (L) >40 mg/dL    Triglycerides 84 <849 mg/dL    Non-HDL,Chol,Calc 85 <130 mg/dL      Vit-D:   Lab Results   Component Value Date/Time    [facility] 39 05/25/2023 09:19 AM       Imaging & Procedures     Imaging Studies:   Last DXA:   Results for orders placed or performed in visit on 03/29/22   DXA lumbar spine+hip    Narrative    =================================================================                         Bone Density Report                         =================================================================    Name:          EDNA, GROVER  Patient ID:    [id number]  Age:           55  Sex:           Male  Ethnicity:     Asian  Date of Birth: 11-05-1955     -----------------------------------------------------------------    Indication: Prior fracture;    Referring Provider: MECHELE, DAVID P.    Study: Bone densitometry was performed.    Exam Date: March 29, 2022    Accession number: [id number]      Bone Density:  -----------------------------------------------------------------  Region                   BMD    T-score  Z-score Classification  -----------------------------------------------------------------  AP Spine(L1-L4)          0.884   -1.9     -1.1  Osteopenia  Femoral Neck (Left)      0.548   -2.8     -1.7       Osteoporosis  Total Hip (Left)         0.654   -2.5     -2.0       Osteoporosis  -----------------------------------------------------------------    World Health Organization criteria for BMD impression   classify patients as:   Normal (T-score at or above -1.0),   Osteopenia (T-score between -1.0 and -2.5), or   Osteoporosis (T-score at or below -2.5).     10-year Fracture Risk:  -----------------------------------------------------------------  FRAX not reported because:    Some T-score for Spine Total or Hip Total or Femoral Neck at   or below -2.5     Prior hip or vertebral fracture  -----------------------------------------------------------------        Impression: The patient has established osteoporosis, based on   the Left Femoral Neck T-score and the existence of a prior   fracture. The patient has risk factors, including: previous   fracture.       Reported by: Clem Barth on 03/29/2022 4:37:00 PM.         Assessment & Problem-Based Plan   Mr. Julion Gatt is a 68 y.o. male  presenting for follow up.     Diagnoses and all orders for this visit:    Medicare annual wellness visit, subsequent  -     TBOC - PHQ-9 (Monitor) Assessment  -     TBOC GAD-7 Behavioral Health Assessment  -     TBOC DAST Behavioral Health Assessment  -     TBOC Audit-C Behavioral Health Assessment  -     CBC; Future; Expected date: 12/25/2023    Gait abnormality  -     Referral to Rehabilitation, Occupational Therapy  -     [facility], Physical Therapy    Imbalance due to old stroke    Pacemaker    Upper GI bleed  -     CBC; Future; Expected date: 12/25/2023  -     Iron & Iron Binding Capacity; Future  -     Ferritin; Future    Idiopathic gout, unspecified chronicity, unspecified site    Cerebrovascular accident (CVA), unspecified mechanism (HCC/RAF)  -     Referral to Rehabilitation, Occupational Therapy  -     Referral to [facility], Physical Therapy    Hypertension, unspecified type    Prediabetes    Need for zoster vaccination  -     Cancel: Zoster Vaccine (Shingrix) (order to administer in-clinic) [SINGLE ORDER, FOR ADMIN TODAY IN-OFFICE]  -     Zoster Vaccine (Shingrix) (order to administer in-clinic) [SECOND IN SERIES, FUTURE 2-6 MONTHS]; Future; Expected date: 12/23/2023    Gout involving toe, unspecified cause, unspecified chronicity, unspecified laterality  -     Uric Acid; Future    Other orders  -     Oyster Shell (CALCIUM  CARBONATE 1250 MG, 500 MG ELEMENTAL,) 1250 mg tablet; Take 1 tablet (1,250 mg total) by mouth daily with breakfast.          Age related osteoporosis, unspecified pathological fracture presence        -     Told pt to follow up with dentist and after cavity fillings to make another appointment with us  to start alendronate therapy   -     Vitamin D 25-OH; COMMON, deficiency status; Future    Screening for abdominal aortic  aneurysm  -     US  abd aorta screening non-vascular; Future    Vaccine for diphtheria-tetanus-pertussis, combined  -     Tdap vaccine >= to 68yo IM    Need for Streptococcus pneumoniae vaccination  -     Pneumococcal conjugate vaccine 20-valent (Prevnar 20) - Preferred if available.        RTC:   Return in about 3 months (around 01/24/2024) for follow up repeat hgb.    The above diagnosis, plan, orders, and follow-up were discussed with the patient. See After-Visit Summary for additional information and counseling materials provided to the patient.    This patient was evaluated and treated under the supervision of the attending, Dr. Deretha.     Author:  Stasia Ewing, MD  [facility] Internal Medicine Resident  10/24/2023  Nikolus Marczak has no Advance Directive or POLST in [facility] Connect and has the capacity to identify a surrogate decision maker. Next steps in advance care planning-: No time to address advance care planning at this visit.  Kalen Ratajczak has no Advance Directive or POLST in Care Connect and has the capacity to identify a surrogate decision maker. {Next steps in advance care planning-:40161}Rondarius Gunby has no Advance Directive or POLST in Care Connect and has the capacity to identify a surrogate decision maker. {Next steps in advance care planning-:40161}  Kyston Gonce has no Advance Directive or POLST in Care Connect and has the capacity to identify a surrogate decision maker. {Next steps in advance care planning-:40161}

## 2023-10-24 NOTE — Patient Instructions
 A note from your doctor:    Thank you for choosing [facility] for your care.    Our plan, as discussed today:    In September when you return, please go to the lab to have your labs drawn. I will contact you about the results.   Please schedule an appointment with an endocrinologist to start treatment for your osteoporosis. Your referral has been placed.   Please continue physical and occupational therapy. We have placed further referrals for you.     Your return appointment should be scheduled for Return in about 3 months (around 01/24/2024) for follow up repeat hgb.    Follow-up: Checkout staff will schedule your follow-up visit.   If you prefer to schedule at a later time, you can call our Call Center at [phone] or request an appointment online through [facility].    ----------------------------------------------------------------------------------------------------------------------    Frequently asked questions:    1. How do I return for laboratory tests?  [facility] [employer] [facility] [employer] [facility], [address]):   Monday to Friday (6:00 am to 7:00 pm)   Weekends and holidays (7:00 am to 3:30 pm)  [facility] [phone] [address]):  Monday to Friday (8:00 am to 6:00 pm)  On request, we can send to outside labs (e.g. [facility] or [facility])  For other [facility] labs, see: [email or web address]    2. How do I get my test results from the visit?  If you sign up for [facility] online ([email or web address]), we will release test results online with comments once your test results are available, usually within 1 week. Some specialized test results may take several weeks.  If you are not signed up for [facility] when your results become available, we will send your test results by letter within 1-2 weeks of your visit.    If another doctor orders a test for you, it is best to speak first with that doctor directly about the result.    3. How can I reach my doctor after the visit?  For non-emergency contact, you may reach your doctor two ways:  Online: send non-urgent medical questions through the [facility] website ([email or web address]). These are usually returned within 2 business days.  Call the [facility] Call Center at [phone] during business hours to leave a message (or schedule a return appointment).    4. How do I get after-hours care?    For serious and life-threatening concerns you should call 911.  Weekdays: Call during business hours to request an urgent appointment: [phone].   Evenings/weekends: Our colleagues offer access to after-hours care at our walk-in clinic:    [facility]  [address], [place] [address]   [phone]  Monday - Friday: 5:00pm to 9:00pm  Saturday - Sunday and Holidays: 9:00 am to 5:00 pm  [email or web address]    [facility] has additional urgent care sites, see locations at: [email or web address]

## 2023-10-25 ENCOUNTER — Ambulatory Visit: Payer: Commercial Managed Care - HMO

## 2023-10-25 DIAGNOSIS — M25571 Pain in right ankle and joints of right foot: Principal | ICD-10-CM

## 2023-10-25 NOTE — Consults
 [facility]  Division of Sports Medicine    SPORTS MEDICINE CONSULTATION        Date: 10/28/2023    Referred by: Leonce Norman SQUIBB., Md  [address]  [address]  [facility]  [place],  [place] [address]    Primary Care Physician:  Jeana Francisco, MD    Chief Complaint: right ankle pain (DOI 08/15/23)    HISTORY:     Dear Dr. Leonce,    I had the pleasure of seeing Andrew [name] today in consultation in sports medicine. Andrew [name] is a 68 y.o. male presenting with right ankle pain x 6 weeks (DOI 08/15/23). The injury occurred when the patient was ambulating with his walker and fell forward after misjudging a step. He was able to get up and continue walking but was then seen in urgent care. He was given a walking boot at the time and started on NSAIDs. He was then seen in orthopedics walk in clinic by Dr. Leonce on 08/19/23 and imaging showed a mildly displaced medial malleolus fracture with stable mortise. It was recommended that he continue weight-bearing as tolerated in his boot. Pain has resolved. He continues to experience mild swelling about the the medial ankle.     History of prior right ankle injury, trauma or surgery: none prior to above    Previous treatments include: RICE, NSAIDs, walking boot    Past Medical History: I have reviewed and confirmed the past medical history in the chart.  Past Medical History:   Diagnosis Date    Antral ulcer 09/2023    CVA (cerebral vascular accident) (HCC/RAF)     H/O aortic valve replacement     Kidney stone     Urinary frequency        Past Surgical History: I have reviewed and confirmed the past surgical history in the chart.  Past Surgical History:   Procedure Laterality Date    AORTIC VALVE REPLACEMENT      CARDIAC PACEMAKER PLACEMENT  09/2023    ESOPHAGOGASTRODUODENOSCOPY  09/2023       Medications: Reviewed medication list in the chart  Outpatient Medications Prior to Visit   Medication Sig ALLOPURINOL  100 mg tablet TAKE 2 TABLETS BY MOUTH EVERY DAY    amiodarone  200 mg tablet Take 1 tablet (200 mg total) by mouth daily.    amoxicillin 500 mg capsule Use as directed prior to dental appointment    apixaban  5 mg tablet Take 1 tablet (5 mg total) by mouth two (2) times daily.    ascorbic acid  500 mg tablet Take 1 tablet (500 mg total) by mouth daily.    ATORVASTATIN  20 mg tablet TAKE 1 TABLET BY MOUTH EVERYDAY AT BEDTIME    metoprolol  tartrate 50 mg tablet Take 1 tablet (50 mg total) by mouth two (2) times daily.    Multiple Vitamins-Minerals (HCA SUPER THERAVITE-M PO) Take 1 tablet by mouth daily.    omeprazole  20 mg DR capsule Take 1 capsule (20 mg total) by mouth two (2) times daily.    Oyster Shell (CALCIUM  CARBONATE 1250 MG, 500 MG ELEMENTAL,) 1250 mg tablet Take 1 tablet (1,250 mg total) by mouth daily with breakfast.    vitamin D, cholecalciferol , 25 mcg (1000 units) tablet Take 1 tablet (25 mcg total) by mouth daily.     No facility-administered medications prior to visit.       Allergies: Reviewed allergy section in the chart  No Known Allergies  Family History:   Family History   Problem Relation Age of Onset    No Known Problems Mother     Other (gastric ulcer) Father     Uterine cancer Sister     Stroke Brother     Kidney failure Brother     Colon cancer Neg Hx     Lung cancer Neg Hx     Prostate cancer Neg Hx        Social History:   Social History     Socioeconomic History    Marital status: Married   Tobacco Use    Smoking status: Former     Current packs/day: 0.00     Average packs/day: 1 pack/day for 15.0 years (15.0 ttl pk-yrs)     Types: Cigarettes     Start date: 05/03/1975     Quit date: 05/02/1990     Years since quitting: 33.5    Smokeless tobacco: Never   Substance and Sexual Activity    Alcohol use: Not Currently     Comment: occasionally has red wine    Drug use: Not Currently     Social Drivers of Health     Financial Resource Strain: Medium Risk (10/25/2023)    Financial Resource Strain     Difficulty of Paying Living Expenses: Somewhat hard       Review of Systems: A 14-point review of systems was performed.  With the exception of the HPI, all other review of systems was negative.     PHYSICAL EXAM:     Vitals: There were no vitals taken for this visit.  General: NAD, pleasant & cooperative  Head: EOMI, no facial lesions    Right Ankle:  Inspection: Edema: mild lateral ankle, Ecchymoses: negative, Deformity: negative, Warmth: negative  ROM: Active and passive ROM limited by stiffness  Palpation: TTP over medial malleolus (mild).  No TTP over 5th metatarsal, lateral malleolus, navicular, or talus.  No TTP over the peroneal, posterior tibial, anterior tibial, and Achilles' tendons.  Strength: 5/5 in dorsiflexion, plantarflexion, eversion, and inversion  Neurovascular: normal distal pulses and sensation.    Skin: No ulcers, erythema, or skin breakdown  Psych: Alert, appropriate affect    IMAGING:     I have personally reviewed and interpreted the following results:     XR ankle AP + lat + obl standing right 3V (10/28/23):  Reason for exam: right ankle pain    Radiographs taken in our office today and reviewed with patient in detail reveal:    Stable alignment of mildly displaced medial malleolus fracture and lateral aspect of distal tibia with evidence of early interval healing. Ankle mortise is intact. No erosion or calcification. No significant degenerative changes.    XR right ankle (08/19/23):  My read: Minimally displaced fractures of the medial malleolus and lateral aspect of distal tibia with intact mortise.    Formal read:  ''Minimally displaced intra-articular fracture of the medial malleolus shows unchanged alignment.   Mildly displaced intra-articular fracture of the lateral aspect of the distal tibia (Tillaux-Chaput tubercle fracture) also shows unchanged alignment.  There is no change in alignment on the gravity stress view.  Persistent diffuse soft tissue swelling, which is most pronounced at the medial aspect of the ankle and distal lower leg.  Joint spaces and ankle mortise are maintained.  Plantar retrocalcaneal enthesophytes.''    ASSESSMENT:     Andrew [name] is a 68 y.o. male who presents with right ankle pain x 6 weeks. History, examination  and imaging are concerning for minimally displaced fractures of the medial malleolus and lateral aspect of distal tibia with intact mortise. I reviewed his repeat XR today, showing stable fracture alignment and early healing changes. Discussed typical management and that this fracture carries risks of post-traumatic osteoarthritis, malunion or nonunion, potentially requiring surgery. Upon discussion of management options, he elects to proceed with ongoing non-operative management. He may continue light weightbearing as tolerated based on pain (only if pain-free). Activity restrictions reviewed. He may remove the boot at rest to work on ankle ROM. RICE as needed for pain or swelling. We will follow up for recheck with repeat XR in 4 weeks. He is agreeable with this plan.    RECOMMENDATIONS & PLAN:     Given our clinical suspicion of the above diagnosis, our initial plan is as follows:    Diagnoses and all orders for this visit:    Displaced fracture of medial malleolus of right tibia, initial encounter for closed fracture  -     XR ankle ap+lat+mortise standing right (3 views); Future    Closed intra-articular fracture of distal end of right tibia, initial encounter  -     XR ankle ap+lat+mortise standing right (3 views); Future    Acute right ankle pain  -     XR ankle ap+lat+mortise standing right (3 views); Future    - Light walking in the CAM walking boot  - No activities that cause pain or with risk of fall  - Ok to remove boot at rest to work on ankle ROM.   - Heat or ice as needed for pain.  - Elevation as needed for swelling.    Follow-Up: Return in about 4 weeks (around 11/25/2023) for Follow up with repeat X-ray.    Medical Decision Making:  Complexity of problems addressed: Moderate  Complexity of data that was personally reviewed and analyzed: Moderate including the independent interpretation of musculoskeletal imaging  Risk of complications & morbidity: Moderate which included a discussion of prescription medications, joint and tissue injections, and surgical interventions    The above plan of care, diagnosis, orders, and follow-up were discussed with the patient.  Questions related to this recommended plan of care were answered.    Once again it was a pleasure to see Andrew [name] today in consultation. Thank you for allowing me to participate in their care.  Please feel free to contact me if you have additional questions or if I can be of any further assistance.    Alan Flint Corporal, MD  [facility]  Departments of Family Medicine and Orthopedics  10/28/2023

## 2023-10-28 ENCOUNTER — Ambulatory Visit: Payer: Commercial Managed Care - Pharmacy Benefit Manager | Attending: Sports Medicine

## 2023-10-28 ENCOUNTER — Ambulatory Visit: Payer: Commercial Managed Care - HMO

## 2023-10-28 DIAGNOSIS — M25571 Pain in right ankle and joints of right foot: Secondary | ICD-10-CM

## 2023-10-28 DIAGNOSIS — D509 Iron deficiency anemia, unspecified: Secondary | ICD-10-CM

## 2023-10-28 DIAGNOSIS — S8251XA Displaced fracture of medial malleolus of right tibia, initial encounter for closed fracture: Principal | ICD-10-CM

## 2023-10-28 MED ORDER — FERROUS GLUCONATE 324 (37.5 FE) MG PO TABS
324 mg | ORAL_TABLET | ORAL | 1 refills | Status: AC
Start: 2023-10-28 — End: ?

## 2023-10-28 NOTE — Patient Instructions
-   Light walking in the CAM walking boot  - No activities that cause pain or with risk of fall  - Ok to remove boot at rest  - Gentle ankle motion encouraged. No resistance training yet.   - Heat or ice as needed for pain.  - Elevation as needed for swelling.

## 2023-10-29 ENCOUNTER — Ambulatory Visit: Payer: Commercial Managed Care - HMO

## 2023-10-29 DIAGNOSIS — E669 Obesity, unspecified: Secondary | ICD-10-CM

## 2023-10-29 DIAGNOSIS — G4730 Sleep apnea, unspecified: Secondary | ICD-10-CM

## 2023-10-29 DIAGNOSIS — Z7189 Other specified counseling: Secondary | ICD-10-CM

## 2023-10-29 DIAGNOSIS — G4719 Other hypersomnia: Secondary | ICD-10-CM

## 2023-10-29 DIAGNOSIS — I10 Essential (primary) hypertension: Secondary | ICD-10-CM

## 2023-10-29 DIAGNOSIS — Z719 Counseling, unspecified: Secondary | ICD-10-CM

## 2023-10-29 NOTE — Consults
 VIDEO VISIT QUESTIONNAIRE     Patient Consent to Telehealth Questionnaire: Agreed by patient  Location: [name] home  Duration: 14 Minutes     Patient Consent to Telehealth   The patient agreed to participate in the video visit prior to joining the visit.    - I agree  to be treated via a video visit and acknowledge that I may be liable for any relevant copays or coinsurance depending on my insurance plan.  - I understand that this video visit is offered for my convenience and I am able to cancel and reschedule for an in-person appointment if I desire.  - I also acknowledge that sensitive medical information may be discussed during this video visit appointment and that it is my responsibility to locate myself in a location that ensures privacy to my own level of comfort.  - I also acknowledge that I should not be participating in a video visit in a way that could cause danger to myself or to those around me (such as driving or walking).  If my provider is concerned about my safety, I understand that they have the right to terminate the visit.        REFERRING PHYSICIAN, PRIMARY CARE PHYSICIAN        Jeana Francisco, MD   [address]  [place] [address]     Jackson Dorn [place]., [place]  [address]  [address]  [place],  [place] [address]       CHIEF COMPLAINT     Reason for Visit:   Chief Complaint   Patient presents with    Snoring         SUBJECTIVE:      Andrew [name]      HISTORY OF PRESENT ILLNESS:        Andrew [name] is a 68 y.o. male with a  has a past medical history of Antral ulcer (09/2023), CVA (cerebral vascular accident) (HCC/RAF), H/O aortic valve replacement, Kidney stone, and Urinary frequency. , atrial fibrillation on apixaban , HTN, prediabetes.     Patient was recently hospitalized 6/9-6/21/25 found to have tachy-brady syndrome s/p PPM 6/14, hospital course c/b melena and found to have antral ulceration s/p 2 hemoclips 6/17. Patient was discharged 6/21 on apixaban . He was readmitted 6/22-6/29/25 for melena s/p EGD with hemoclip and 1u pRBC transfusion.     Wife present during video visit. During admission, patient was told he had low oxygen levels during sleep and had witnessed apneic events. Patient was told upon discharge to get a sleep study and that he might have sleep apnea. He has noticed that he has had daytime sleepiness for few years. Patient has been disabled since 2013 since his stroke and has residual left sided weakness. He does not drive.     SLEEP COMPLAINTS & SYMPTOM INVENTORY:     Symptom Details   Sleep   Disordered  Breathing  [x]  Snoring    [x]  Apneas    [x]  Choking spells   Insomnia  []  Sleep onset  difficulties   []  Sleep maintenance problems   []  Early morning awkening    []  Sleep-wake schedule abnormalities   Abnormal   Movements/  Behaviors  []  Urge to move limbs    []  Dream enactment behaviors    []  Amnestic complex behaviors       []  Disruptive leg jerks         Sleep -Wake Schedule Patterns:   []   Usual bedtime: 11pm   []         Sleep latency: 15 min   []         Subjective wake after sleep onset:   []         Nocturnal awakenings: 4x to use restroom   []         Approximate wake after sleep onset:   []         Time of final awakening: 7am   []         Napping (time, duration): 30 min couple times a week, 1pm   []         Weekend variation:yes      Sleep Symptoms:   [x]         Patient endorses snoring during sleep   []         Reports trouble focusing and concentrating,    []         Excessive daytime sleepiness (EDS) impairing performance.    []         EDS adversely affects quality of life   [x]         Daytime fatigue and non-restorative sleep   []         Reports morning headache   [x]         Reports wakening with dry mouth   []         Reports daily caffeine consumption   []         Reports sleepiness with driving   []         Reports motor vehicle collisions due to drowsy driving      Sleep Disordered Breathing:   [x]         Snoring   [x]  Apnea- like events or choking spells   []         Dyspnea   [x]         Morning headaches or confusion/AM sleep inertia.   []         Coexisting lung disease     []         Coexisting heart disease       Narcolepsy Symptoms:    [x]         Excessive daytime sleepiness (see Epworth Sleepiness Score)   []   Sudden urges of sleep attacks   []   Cataplexy-like spells   []         Sudden loss of strength in legs / arms during day, often precipitated by strong emotions such as laughter or fright)   []   Sleep paralysis events   []   Hallucinations: hypnagogic/hypnopompic hallucination-events.   []   Fragmented nocturnal sleep   []   Automatic behaviors        Motor Behaviors of sleep:      When you try to relax in the evening or sleep at night, do you ever have unpleasant, restless feelings in your legs that can be relieved by walking or movement?    []        Urge or compulsion  to move the legs/limbs   []        Urge to move is accompanied by an uncomfortable sensation in the legs   []        Symptoms are worse with rest or inactivity (lying down or sitting)   []        Symptoms are worse later in the day and in the evening   []        Symptoms are relieved by movement of the limbs/leg   []   Worse with rest or inactivity (lying down or sitting)   []        Worse later in the day and in the evening   []        Symptoms are relieved by movement of the legs   []   Leg symptoms/movements   []   Motor restlessness         Screening for other movement events during sleep:   []   Bruxism   []   Sensation of periodic limb movement         Parasomnia and complex nocturnal behaviors:    []   Night terrors   []   Automatic behaviors   []   Dream enactment    []   Anosmia, orthostatic hypotension, constipation, depression   []         NREM Parasomnia Events: Recurrent persistent confusional   arousal, night eating, sleep walking or sleep terrors   []         REM Parasomnia Events: Denies dream enactment; injuries      Allergic Rhinitis Screen:   []  Symptoms of allergic rhinitis including nasal congestion, post-nasal drip, rhinorrhea, sneezing, throat clearing cough, itchy eyes: No      GERD Screen:   []         Symptoms suggestive of gastroesophageal reflux including    heartburn, sour brash, bitter taste when awakening: no      Other subjective complaints:   []         Anxiety or rumination   []   Pain and discomfort at night   []         Waking up with heart pounding or racing      Question [name] Response    Are you currently satisfied with your sleep? yes   Are you tired or sleepy during the daytime? yes     Question [name] Response   Do you snore loudly or stop breathing while sleeping? yes   Do you consider yourself to be overweight? Yes, working on weight loss   Do you feel sleepy when working or while driving a car? N/a- patient has been disabled since 2013 and does not drive   Do you often have difficulty falling asleep or staying asleep? no   When you try to relax in the evening or sleep at night, do you have unpleasant, restless feelings in your legs that can be relieved by walking or movement? no   Do you act out your dreams while sleeping? No- but has involuntary twitching on left side, residual effects from previous stroke 2013   Do you have a variable bedtime or awakening time? no     Bed partner collateral data   []        Sleeping separately because of snoring  []        Motor behaviors  []        Complex behaviors         Current view: Showing all answers       Legend:           Triggered an [employer]  Scoring question                           [facility] Patient Location Tracking For Video Visits       Question 10/22/2023 10:32 AM PDT - Fredricka by Patient    At the time of your visit, will you be in a healthcare facility? This may include being in a hospital, nursing home  or rehab facility. If you will physically be in one of those facilities, please select Yes. If you are in a private residence (even if it is not your own home), a senior center, independent living, board and care, or assisted living, select No. No          [facility]       Question 10/29/2023  7:43 AM PDT - Fredricka by Patient    Constitutional     Fever       Weight loss       Fatigue Yes    Eye problems     Blurred vision       Double vision       Loss of vision       Eye pain       Eye redness       Eye dryness       Ear/Nose/Throat     Trouble hearing       Ringing in ear(s)       Dizziness (vertigo)       Loss of balance Yes    Ear pain       Ear discharge       Hoarseness       Trouble swallowing       Slurred speech       Cardiovascular     Chest pain       Irregular heart beat       Fast heart beat       Limb swelling       Limb pain on walking       Fainting       Respiratory     Trouble breathing       Chronic cough       Coughing blood       Gastrointestinal     Indigestion       Heart burn       Abdominal pain       Nausea       Vomiting       Regurgitation       Diarrhea       Constipation       Bloody stools       Musculoskeletal     Muscle pain       Muscle cramp       Muscle twitches Yes    Loss of muscle bulk       Neck pain       Back pain       Joint pain       Joint stiffness       Joint swelling       Skin & Breast     Numbness       Tingling       Discoloration       Neurologic     Headache       Face pain       Face numbness       Weakness       Tremors       Clumsiness       Blackouts       Trouble with memory       Trouble concentrating       Psychiatric     Hallucinations       Feeling depressed       Trouble sleeping       Suicidal thoughts  Inappropriate crying       Inappropriate laughing       Hematologic/Lymphatic     Abnormal bleeding       Nose bleeds       Lumps or swellings       Allergic/Immunologic     Skin rash       Joint pain       Dry eyes &/or dry mouth       Endocrine     Excessive thirst       Heat or cold intolerance       Excessive urination       Please specify relationship to the patient Family    Please use this space to explain, if needed.             [facility] Adult Short Form V1.0 Sleep Disturbance       Question 10/29/2023  7:51 AM PDT - Filed by Patient    In the past 7 days     I was satisfied with my sleep. Somewhat    My sleep was refreshing. Somewhat    I had a problem with my sleep. Quite a bit    I had difficulty falling asleep. Not at all    My sleep was restless. Not at all    I tried hard to get to sleep. Somewhat    I worried about not being able to fall asleep. Quite a bit    My sleep quality was... Fair    PROM[facility] Adult Short Form-Sleep Disturbance Score (range: 29 - 77) 52.2          [facility] Part 1       Question 10/29/2023  8:03 AM PDT - Fredricka by Patient    What is your usual health status? My activity is physically restricted    Do you feel that you: Have trouble getting a good night's sleep?     Have non-refreshing sleep?     Are sleepy during the day?     Are tired (fatigued) during the day?    What time do you usually go to bed? 11:00 pm    Does this time vary? Yes    How long does it usually take you to fall asleep? 15 minutes    On average, how many hours of sleep do you get each night? 6 hours    When trying to fall asleep, how often do you have thoughts racing through your mind? Sometimes    When trying to fall asleep, how often do you feel sad or depressed? Sometimes    When trying to fall asleep, how often do you have anxiety/worry about things? Sometimes    When trying to fall asleep, how often do you feel muscular tension? Sometimes    When trying to fall asleep, how often do you feel afraid of not being able to sleep? Sometimes    When trying to fall asleep, how often do you feel unable to move? Never    When trying to fall asleep, how often do you have a creeping, crawling, aching or twitching in your limbs making you feel you have to move them? Sometimes    When trying to fall asleep, how often do you have any kind of pain or discomfort? Never    When trying to fall asleep, how often do you feel afraid of the dark or anything else? Never    When trying to fall asleep, how often do you  feel afraid you won't return to sleep? Sometimes    How many times do you usually awaken each night? 4    Do you have trouble getting back to sleep? no    On a typical night, what is your longest period of wakefulness? 15 minutes    How long are you awake all together during the night? 30 minutes    Do you usually wake up during the first or latter part of the night? first    How often do you sleep with someone else in your bed? Sometimes    How often do you sleep with someone else in your room? Often    How often do you have restless, disturbed sleep? Sometimes    How often do you get up at night to attend to your children? Never    How often do you snore loudly and/or disruptively? Often    How often do you hold your breath or stop breathing while you sleep? Sometimes    How often do you have nasal congestion during the night? Sometimes    How often do you suddenly awaken gasping for air? Sometimes    How often do you have some other breathing problem at night? Sometimes    How often do you feel your heart pounding during the night? Never    How often do you sweat a lot during the night? Never    How often do you walk in your sleep? Never    How often do you fall out of bed while asleep? Never    How often do you wake up screaming, violent, or confused? Never    How often do you have unusual movements while asleep? Sometimes    How often do you wet the bed?       How often do you grind your teeth at night? Never    How often do you have a night full of intense, vivid dreams? Sometimes    How often do you have nightmares? Sometimes    How often do you wake up from a dream? Sometimes    How often do you have racing thoughts during your sleep? Sometimes    How often do you have a recurring dream that disturbs your sleep? Sometimes    What time do you usually wake up in the morning? 7:00 am    What time do you usually get out of bed? 8:00 am    How much does your final awakening vary? don't understand    How often do you depend on an alarm clock to wake you up? Sometimes    How often do you ''sleep in'' in the morning (more than one hour past your normal time to get up)? Sometimes    How often do you have a very hard time waking up? Sometimes    How often do you feel unable to move when waking up? Never    How often do you have dream-like images when waking up even when you know that you are not sleeping? Sometimes          [facility] Part 2       Question 10/29/2023  8:11 AM PDT - Fredricka by Patient    What is your usual health status? My activity is physically restricted    How often do you wake up confused or disoriented? Never    How often do you wake up with a headache? Never    How often do  you wake up with a dry mouth? Often    Have you ever slept or been overwhelmingly sleepy for several days at a time? No    Have you ever been unable to sleep for several days at a time? No    Do you feel that your sleep is abnormal? Yes    If you have or have had a sleep or sleepiness problem, was it worse at any time in the past? No    Have you ever had any trouble with sleep during your childhood? No    How long does it usually take you to get going after you get out of bed? 1 hour    How often do you feel extremely alert and energetic during the whole day? Sometimes    How great a problem do you have with fatigue (tiredness, exhaustion, or lethargy) even when you are not sleepy? Sometimes    How great of a problem do you have with sleepiness (sleepy or struggling to stay awake) in the daytime? Sometimes    How often do  you fall asleep unintentionally? Please give an example.       How often do you feel sad or depressed? Sometimes    How often do you feel muscular tension? Sometimes    How often do you feel weakness in your muscles when laughing, being surprised, angry, excited, etc.? Sometimes    How great of a problem do/did you have with your education because of sleepiness/fatigue?       How often have you had a problem with your performance at work because of sleepiness/fatigue? Never    How many times have you ever had accidents at work because of sleepless nights? Never    How many times have you been involved in automobile accidents? n/a    How many near miss auto accidents have you had due to sleepiness/fatigue? n/a    How many rest (not sleeping) periods do you usually take in a weekday? n/a    How many times in a usual weekday do you try to take a nap but can't sleep? n/a    How long is your nap and/or rest periods? n/a    Do you feel refreshed after your naps/rest periods? n/a    How long do you feel refreshed after a nap/rest period? n/a    Choose the most appropriate answer - While sitting and reading: Slight chance of dozing    Choose the most appropriate answer - While watching TV: Moderate chance of dozing    Choose the most appropriate answer - While sitting inactive in a public place (theater, meeting, etc.): Slight chance of dozing    Choose the most appropriate answer - As a passenger in a car for an hour without a break: Slight chance of dozing    Choose the most appropriate answer - Lying down to rest in the afternoon when able: Slight chance of dozing    How often do you have vivid dreams during a nap? Sometimes    How often do you experience vivid dream like images while falling asleep or awakening from a nap even though you know you are still awake? Sometimes    How often do you feel unable to move while falling asleep or waking up? Sometimes    How often do you discover that you have performed a complex act such as driving a car, and not remembered how you did it? Never    How often do  you find yourself doing things that make no sense (such as writing nonsense or mixing chocolate and gravy)? Never    How bad is the pain? The pain is severe    Did you have an injury that caused the pain? Yes, the pain started after an injury Please describe the circumstances of your injury broke an ankle    Have you ever been diagnosed with osteoporosis or any other bone weakness? Yes    Was your injury related to your job? No    How long has the pain been present? More than 1 week but less then 4 weeks    Do you have a fever? No, I do not have a fever    Have you ever been diagnosed with cancer? No    Have you ever been diagnosed with arthritis? No          [facility] Epworth Sleepiness Scale (Ess)       Question 10/29/2023  8:12 AM PDT - Fredricka by Patient    How likely are you to doze off or fall asleep in the following situations, in contrast to feeling just tired?     Sitting and reading high chance of dozing    Watching TV high chance of dozing    Sitting inactive in a public place (e.g. a theater or a meeting) high chance of dozing    As a passenger in a car for an hour without a break high chance of dozing    Lying down to rest in the afternoon when circumstances permit high chance of dozing    Sitting and talking to someone high chance of dozing    Sitting quietly after a lunch without alcohol high chance of dozing    In a car, while stopped for a few minutes in traffic high chance of dozing    Epworth Sleepiness Scale Score (range: 0 - 24) 24                 *+    PAST MEDICAL HISTORY:      Active Ambulatory Problems     Diagnosis Date Noted    Abnormality of gait 01/09/2012    Hx of medication noncompliance 01/11/2012    Muscle weakness (generalized) 01/11/2012    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) 01/11/2012    Hypertension 03/11/2012    Gout 03/11/2012    Seborrheic dermatitis 03/11/2012    Urinary frequency     H/O aortic valve replacement     Bicuspid aortic valve 06/09/2020    Age related osteoporosis 07/20/2022    Prediabetes 07/20/2022    H/O bicuspid aortic valve 08/09/2023    H/O: CVA (cerebrovascular accident) 08/09/2023    AF (paroxysmal atrial fibrillation) (HCC/RAF) 10/09/2023    Tachy-brady syndrome (HCC/RAF) 10/09/2023    Cardiac pacemaker in situ 10/09/2023     Resolved Ambulatory Problems     Diagnosis Date Noted    Muscle weakness 01/09/2012    Aortic stenosis 03/11/2012    History of aortic valve replacement 03/11/2012    CVA (cerebral vascular accident) (HCC/RAF)      Past Medical History:   Diagnosis Date    Antral ulcer 09/2023    Kidney stone        PAST SURGICAL HISTORY:      Past Surgical History:   Procedure Laterality Date    AORTIC VALVE REPLACEMENT      CARDIAC PACEMAKER PLACEMENT  09/2023    ESOPHAGOGASTRODUODENOSCOPY  09/2023  MEDICATIONS:          Current Outpatient Medications (Pain Meds):     ALLOPURINOL  100 mg tablet, TAKE 2 TABLETS BY MOUTH EVERY DAY, Disp: 180 tablet, Rfl: 1      Current Outpatient Medications (Cardiac Meds):     amiodarone  200 mg tablet, Take 1 tablet (200 mg total) by mouth daily., Disp: 90 tablet, Rfl: 3    ATORVASTATIN  20 mg tablet, TAKE 1 TABLET BY MOUTH EVERYDAY AT BEDTIME, Disp: 90 tablet, Rfl: 3    metoprolol  tartrate 50 mg tablet, Take 1 tablet (50 mg total) by mouth two (2) times daily., Disp: 180 tablet, Rfl: 3    Current Outpatient Medications (GI Meds):     omeprazole  20 mg DR capsule, Take 1 capsule (20 mg total) by mouth two (2) times daily., Disp: 60 capsule, Rfl: 3    Current Outpatient Medications (Nutrition):     ascorbic acid  500 mg tablet, Take 1 tablet (500 mg total) by mouth daily., Disp: , Rfl:     Multiple Vitamins-Minerals (HCA SUPER THERAVITE-M PO), Take 1 tablet by mouth daily., Disp: , Rfl:     Oyster Shell (CALCIUM  CARBONATE 1250 MG, 500 MG ELEMENTAL,) 1250 mg tablet, Take 1 tablet (1,250 mg total) by mouth daily with breakfast., Disp: 120 tablet, Rfl: 2    vitamin D, cholecalciferol , 25 mcg (1000 units) tablet, Take 1 tablet (25 mcg total) by mouth daily., Disp: , Rfl:       Current Outpatient Medications (Heme Meds):     apixaban  5 mg tablet, Take 1 tablet (5 mg total) by mouth two (2) times daily., Disp: 180 tablet, Rfl: 3    ferrous gluconate  324 mg tablet, Take 1 tablet (324 mg total) by mouth every other day., Disp: 45 tablet, Rfl: 1      Current Outpatient Medications (Antibiotics):     amoxicillin 500 mg capsule, Use as directed prior to dental appointment, Disp: , Rfl:               ALLERGIES:      No Known Allergies    SOCIAL HISTORY:      Social History     Socioeconomic History    Marital status: Married   Tobacco Use    Smoking status: Former     Current packs/day: 0.00     Average packs/day: 1 pack/day for 15.0 years (15.0 ttl pk-yrs)     Types: Cigarettes     Start date: 05/03/1975     Quit date: 05/02/1990     Years since quitting: 33.5    Smokeless tobacco: Never   Substance and Sexual Activity    Alcohol use: Not Currently     Comment: occasionally has red wine    Drug use: Not Currently     Social Drivers of Health     Financial Resource Strain: Medium Risk (10/25/2023)    Financial Resource Strain     Difficulty of Paying Living Expenses: Somewhat hard            Substances   []         Alcohol Intake (quantity, timing)   [x]   Caffeine Intake (quantity, timing) - 1 cup of tea couple times a week   []         Tobacco/Nicotine Exposure (quantity, timing)   []         Recreational Agents (quantity, timing)   []         Dietary Supplements (quantity, timing)  FAMILY HISTORY:      Family History   Problem Relation Age of Onset    No Known Problems Mother     Other (gastric ulcer) Father     Uterine cancer Sister     Stroke Brother     Kidney failure Brother     Colon cancer Neg Hx     Lung cancer Neg Hx     Prostate cancer Neg Hx      Brother- OSA    QUESTIONNAIRES:     EPWORTH SLEEPINESS SCALE (ESS): 24    The ESS presents eight different situations to a subject who must assign a score of 0 to 3 (maximum score 24), corresponding to the likelihood of falling asleep in each situation.  Excessive daytime sleepiness is typically defined as a score >=10. (Adapted from Naomi Elbe A New Method for Measuring Daytime Sleepiness: The Epworth Sleepiness Scale. Sleep 1991; 14(6): 540 - 545).      0 = would never doze   1 = slight chance of dozing   2 = moderate chance of dozing   3 = high chance of dozing     Situation:  ESS Score by Category  Total Score   Sitting and reading   []  0 []  1 []  2 [x]  3    Watching TV   []  0 []  1 []  2 [x]  3    Sitting inactive in a public place   []  0 []  1 []  2 [x]  3    Being a passenger in a car for an hour   []  0 []  1 []  2 [x]  3    Lying down in the afternoon   []  0 []  1 []  2 [x]  3    Sitting and talking to someone   []  0 []  1 []  2 [x]  3    Sitting quietly after lunch (no alcohol)  []  0 []  1 []  2 [x]  3    Stopping for a few minutes in traffic while driving  []  0 []  1 []  2 [x]  3        REVIEW OF SYSTEMS:      All systems below reviewed and negative, except as marked:     System Check if Present Description   Constit  []  Fever []  Chills []  Nightsweats     HEENT  []  Vision changes      Resp  []  Shortness of breath []  Wheezing      CV  []  Chest pain  []  Palpitations     GI  []  Nausea []  Vomiting  []  Constipation   []  Diarrhea  [] Abdominal pain  []  Bloating     MSK  [x]  Muscle/Joint pain      Endo  []  Heat/cold intolerance  []  Polyuria/polydipsia   []  Abnormal hair growth  []  Acne     Skin  []  Rashes     Heme  []  Easy bleeding/bruising     Neuro  []  Headaches  []  Hx of seizure     Psych  []  Depression    Sleep  []  Insomnia [x]  Snoring [x]  Apneic Spells   []  Nocturnal movements []  Dream enactment      [x]  Sleepiness []  Cataplexy []  Sleep Paralysis   []  Restlessness []  Urge to move        []  ROS conducted and all of the above was negative     ROS      SLEEP MEDICINE EXAMINATION     Modified Mallampati (  Friedman) Class Type (*): 3     (*) Modified Mallampati Scoring: 3-4    Class 1: Soft palate, uvula, fauces, pillars visible.  Class 2: Soft palate, uvula, fauces visible.  Class 3: Soft palate, base of uvula visible.  Class 4: Only hard palate visible.        Mallampati Class:     The Mallampati class is assessed with the tongue protruded, or in a modified form with the tongue remaining on the floor of the mouth. Both the Mallampati class and tonsillar enlargement are classified into 4 categories, with increasing class indicating greater loss of airway volume.    (Adapter from [place], Corina M, Simel DL. Does This Patient Have Obstructive Sleep Apnea? The Rational Clinical Examination Systematic Review. JAMA. 2013;310(7):731-741. doi:10.1001/jama.7986.723814).    Tonsillar Size: Grade 1-2        (**) Molar Occlusion Scoring: 2  Class I is considered an ideal relationship for how the maxillary molar teeth occlude with the mandibular molar teeth.   Class II  is often referred to as an overbite. The maxillary molars position is protruded anteriority (referred to as an ''overbite''), when compared with the mandibular  molars when they occlude together.   Class III occlusion is often referred to as an underbite. The mandibular teeth are positioned forward to the maxillary teeth.            LABORATORY STUDIES:      TSH   Date Value Ref Range Status   10/16/2023 2.1 0.3 - 4.7 mcIU/mL Final     Comment:     TSH is normal, no further Thyroid tests were performed.    If applicable TSH pregnancy reference intervals:   First trimester (10-[redacted] weeks gestation): 0.03- 4.0 mcIU/mL  Second trimester (14-[redacted] weeks gestation): 0.19- 4.0 mcIU/mL     09/10/2023 4.1 0.3 - 4.7 mcIU/mL Final     Comment:     TSH is normal, no further Thyroid tests were performed.    If applicable TSH pregnancy reference intervals:   First trimester (10-[redacted] weeks gestation): 0.03- 4.0 mcIU/mL  Second trimester (14-[redacted] weeks gestation): 0.19- 4.0 mcIU/mL          Vitamin D,25-Hydroxy   Date Value Ref Range Status   05/25/2023 39 20 - 50 ng/mL Final     Comment:     Ingestion of high levels of biotin in dietary supplements may lead to falsely increased results.    Deficiency: less than 12 ng/mL                                                   Inadequate: 12-19 ng/mL Adequate: 20-50 ng/mL                                                             Potential adverse effects: greater than 50 ng/mL          Lab Results   Component Value Date    FERRITIN 28 10/19/2023     Lab Visit on 10/21/2023   Component Date Value Ref Range Status    Helicobacter pylori Antigen 10/21/2023 Negative  Negative Final  RELEVANT NEUROLOGIC STUDIES:      Results for orders placed or performed during the hospital encounter of 08/08/23   Echo adult transthoracic complete   Result Value Ref Range    Left Ventricular Ejection Fraction 62.5 %    Narrative     [facility]        [address]           [place], [place] [address]            Phone: [phone]     TRANSTHORACIC ECHOCARDIOGRAM REPORT     Patient Name:        JERIS ROSER         Date of Exam:   08/08/2023  Medical Rec #:       [id number]                Accession #:    [id number]  Date of Birth:       07/17/1955              Height:         66 in  Age:                 58 years               Weight:         202 lbs  Gender:              M                      BSA:            2.01 m  Referring Physician: [id number] [facility] VUCICEVIC Blood Pressure: /  Diagnosis:  Z95.2-S/P AVR (aortic valve replacement)  Indication: Aortic valve replacement.     MEASUREMENTS:     LVIDd (2D) 4.40 cm (index 2.19 cm/m) LVIDs (2D) 2.70 cm (index 1.34 cm/m)  IVSd (2D)  1.40 cm                    LVPWd (2D) 1.30 cm     FINDINGS:  LEFT VENTRICLE: The left ventricular size is normal. Moderate concentric left ventricular hypertrophy. Normal LV regional wall motion. The left ventricular systolic function is normal. Left ventricular ejection fraction is approximately 60 to 65%.   Abnormal LV diastolic function with an impaired physiology (Grade I). The left ventricle stroke volume by Doppler method is 72.0 ml (index 35.9 ml/m). MV deceleration time is 225 msec.  MV E velocity is 0.76 m/s. MV A velocity is 1.18 m/s. E/A ratio is 0.64.  Lateral e' velocity is 5.4 cm/s. Medial e' velocity is 7.7 cm/s.  Lateral E/e' ratio is 13.9. Medial E/e' ratio is 9.8. Averaged E/e' ratio is 11.5.  LEFT ATRIUM: The left atrium is normal in size. The LA volume (Biplane method) is 44.9 ml. The LA Volume index is 22.4 ml/m.  RIGHT ATRIUM: The right atrium is normal in size. RA area is 14.6 cm. RA volume is 33.5 ml. The RA volume index is 16.7 ml/m.  SHUNTS: Color Doppler shows no evidence of an intracardiac shunt.  RIGHT VENTRICLE: The right ventricular size is normal. Global RV systolic function is normal. TAPSE is 17 mm. The RV free wall tissue Doppler S' wave measures 10.7 cm/s. The right ventricle basal diameter measures 39 mm. The right ventricle mid cavity   measures 32 mm. The right ventricle longitudinal diameter  measures 64 mm.  MITRAL VALVE: The posterior leaflet is mildly thickened and/or calcified. The mitral valve appears thickened. Mitral annular calcification noted. Trace mitral valve regurgitation. There is no mitral valve stenosis. The mean gradient across the mitral   valve is 1.0 mmHg.  AORTIC VALVE: S/p bioprosthetic AVR. 25 mm Carpentier bioprosthetic valve is present in the aortic valve position. No evidence of aortic valve regurgitation. The LVOT velocity is 0.82 m/s. The peak aortic valve velocity is 2.20 m/s. The peak   instantaneous pressure gradient is 19.4 mmHg; mean pressure gradient is 11.0 mmHg; LVOT diameter is 2.10 cm. By the continuity equation, the calculated aortic valve area by Vmax method is 1.30 cm (index 0.65 cm/m) and by VTI method is 1.70 cm (index 0.85   cm/m). No aortic valve stenosis.  TRICUSPID VALVE: The tricuspid valve appears normal in structure. There is no tricuspid valve stenosis. Trace tricuspid regurgitation is present.  PULMONIC VALVE: The pulmonic valve is normal. Trace pulmonary valve regurgitation. No evidence of pulmonic valve stenosis.  AORTA: Mildly dilated proximal ascending aorta. The sinus of Valsalva measures 37 mm (index 18 mm/m) The proximal ascending aorta measures 41 mm (index 20 mm/m). No evidence for aortic coarctation.  PULMONARY ARTERY: The peak TR velocity is not well defined, and therefore, pulmonary artery systolic pressure cannot be calculated.  IVC: The IVC measures at 1.40 cm. Normal inferior vena cava in diameter. There is greater than 50% collapse of the IVC during respiration. Normal right atrial pressure.  PERICARDIUM: There is no pericardial effusion.       Impression     1. Normal left ventricular size, moderate concentric left ventricular hypertrophy and normal LV systolic function.   2. LV ejection fraction is approximately 60 to 65%.   3. Abnormal LV diastolic function (Grade I).   4. A 25 mm Carpentier bioprosthetic valve is present in the aortic position. Echo findings are consistent with normal structure and function of the aortic prosthesis with mean gradient of 11 mmHg.   5. The peak TR velocity is not well defined, and therefore, pulmonary artery systolic pressure cannot be calculated.   6. Mildly dilated proximal ascending aorta (41 mm).   7. Prior examinations are available and were reviewed for comparison purposes. Compared to prior study on 09/07/22, there are no significant changes.  [id number] Franco Bring MD  Electronically signed by [id number] Franco Bring MD on 08/13/2023 at 10:14:50 AM     Sonographer: Camellia Loges      Final         SLEEP STUDIES:      Pending    ASSESSMENT:      Andrew [name] is a 68 y.o. male with a  has a past medical history of Antral ulcer (09/2023), CVA (cerebral vascular accident) (HCC/RAF), H/O aortic valve replacement, Kidney stone, and Urinary frequency. . The patient is presenting with     - Sleep-disordered breathing, likely obstructive sleep apnea, as indicated by low oxygen levels during sleep during recent admission, witnessed apneic events, loud snoring, choking spells, and daytime sleepiness.  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    SYMPTOM DOCUMENTATION IN SUPPORT OF SLEEP APNEA DIAGNOSIS:    Evidence of Excessive Daytime Sleepiness  [x]  Disturbed or restless sleep  [x]  Non-restorative sleep  [x]  Frequent unexplained arousals from sleep  []  Fragmented sleep  [x]  Epworth Sleepiness Scale (ESS) greater than  or equal to 10  [x]  Fatigue    Evidence Suggestive of Sleep Disordered Breathing  [x]  Habitual loud snoring  [x]  Witnessed apneas during sleep  [x]  Choking or gasping during sleep  [x]  BMI greater than or equal to 30  []  Neck circumference greater than 17 inches (men) or greater than16 inches (women)  []  Sleep related bruxism  []  Cognitive deficits such as inattention or memory  []  Unexplained nocturnal reflux  []  Erectile dysfunction  []  Apneas or hypoxemia during procedures requiring anesthesia  [x]  Morning headaches       RECOMMENDATIONS:      - Conduct an in-lab polysomnography to evaluate for sleep-disordered breathing and periodic leg movements. The study will include a split night with EMG montage to assess for sleep apnea and other potential sleep disturbances.  - If obstructive sleep apnea is diagnosed, initiate management with CPAP therapy to maintain airway patency and reduce the risk of future cardiovascular events.  - Encourage daily outdoor light exposure for at least one hour, preferably in the morning, to help regulate the circadian rhythm and improve wakefulness.  - Advise limiting naps to 15-20 minutes between 12:00 to 2:00 PM to enhance their effectiveness and avoid disrupting nighttime sleep.  - Recommend avoiding exposure to electronic screens and bright lights after 9 PM to prevent interference with sleep quality.  - If sleep apnea is confirmed, consider discussing pharmacotherapy options for weight reduction with the primary care physician, particularly given the patient's BMI of 30 and the potential benefits for managing sleep apnea.  - Plan a follow-up communication one to two weeks after the polysomnography to discuss the results and further recommendations.    --------------------------------------    Suspect sleep apnea: I discussed with patient the pathophysiology of obstructive sleep apnea (OSA), as well as the health risks associated with untreated obstructive sleep apnea including hypertension, refractory hypertension, coronary artery disease, heart failure, cardiac dysrhythmia, stroke, insulin resistance as well as the potential to exacerbate underlying mood disorders as illustrated in the diagram below:           I explained that OSA is a common sleep disorder which is characterized by temporarily cessation of breathing during sleeping, leading to decreases in oxygen levels and sympathetic activation. Pauses in breathing also lead to sleep fragmentation and lead to nonresporative sleep, and hypersomnolence.     I provided Shadee Montoya details specific to the risks of untreated obstructive sleep apnea leading to increased cardiovascular disease, including hypertension and type 2 diabetes mellitus, hypertension, coronary heart disease, cardiac arrhythmia, and heart failure. The pathophysiologic mechanisms and outcomes of OSA is summarized below:     (A) OSA is characterized by recurrent episodes of complete (apnea, represented in this figure) or partial (hypoapnea) obstruction of the upper airway during sleep.                 (B) Apnea episodes (between red broken lines in the figure below) result in cessation of the airflow, often accompanied by reduced oxygen (O2) saturation and increased systemic blood and arterial pulmonary pressure. As a result, sympathetic neural activity (SNA) increases and apnea episodes end with an arousal of the central nervous system to restore upper airway patency, marked by an increased electroencephalogram (EEG) wave frequency. Thus, repetitive obstruction episodes while sleeping culminate in cyclical deoxygenation-reoxygenation (intermittent hypoxia; IH), overactivation of SNA, bursts in systemic blood and arterial pulmonary pressures, and several arousals and microarousals that result in sleep fragmentation (SF).       (  C) As a result, OSA is associated with oxidative stress, inflammation, endothelial dysfunction, and changes in circulating factors.     (D) Untreated OSA has been associated with an increased predisposition for several impairments and diseases, including hypertension, cardiovascular diseases, metabolic disorders, strObstructive Sleep Apnea and Hallmarks of Aging         (From [place],et al Trends in Molecular Medicine DOI: 10.1016/j.molmed.2017.06.006)      Although most studies have demonstrated these associations independent of the confounding influence of obesity, data points towards causal pathway towards multifactorial etiologic risks, which also illustrate that successful treatment of OSA with continuous positive airway pressure (CPAP) can improve cardiovascular outcomes.I reviewed treatment options for obstructive sleep apnea including: weight loss, positional therapy (elevate head of bed, maintain sleep in lateral positions), surgery, oral appliance therapy, where the gold standard treatment is positive airway pressure (PAP) therapy.    If sleep study demonstrates obstructive sleep apnea, the patient will undergo a trial of PAP therapy and will participate in PAP initiation and management with a follow up within 30-to 90 days to evaluate adherence to therapy. I discussed with patient the logistics and benefits behind nightly PAP therapy    Sleep hygiene is recommended as an initial intervention for all adults with insomnia so that personal habits and environmental factors that negatively impact sleep can be identified and corrected. Patients should be advised to exercise regularly (not within four hours of bedtime); avoid large meals and limit fluid intake in the evenings; limit caffeine, tobacco, and alcohol intake four to six hours before bedtime; use the bedroom for sleep and sex only; maintain a regular sleep-wake cycle without daytime napping; and avoid negative stimuli at bedtime, such as loud noises, bright lights from electronics after 9PM, and extreme temperature variations, but keep the temperature slightly in the cooler side (68?F) and advised that patient pulls over to the side of the road whenever  feeling sleepy while driving. Until the patient is treated,  sleep in the supine position should be avoided as well as a sedatives and alcohol, which may worsen sleep apnea.    ------------------------------------------------------    I would recommend that you also work with your primary care physician to initiate treatment with Zepbound,    Zepbound is now approved for Obstructive Sleep Apnea  in the setting of obesity.  The FDA has approved the first prescription drug, Zepbound, for treating moderate to severe obstructive sleep apnea (OSA) in adults with obesity. Studies have shown that when combined with a low-calorie diet and increased exercise, Zepbound improves OSA symptoms by reducing body weight, particularly in those with weight-related health issues.     OSA is a breathing disorder where the upper airway becomes partially or fully blocked during sleep, causing shallow breathing or brief pauses in breathing (apnea), often followed by gasping, snorting, or waking suddenly. OSA can reduce oxygen flow, disrupt heart rhythms, and harm overall health. Common signs such as snoring, tiredness, daytime sleepiness, and disrupted sleep often go unnoticed. OSA is more common in people with obesity, as excess neck weight can press down on the airway.      Zepbound, the brand name for the generic drug tirzepatide, is an injectable medication that aims to treat OSA by causing weight loss. It works by activating special proteins -- glucagon-like peptide 1 (GLP-1) and glucose-dependent insulinotropic polypeptide (GIP) receptors -- in the intestine, which help suppress appetite and reduce eating. This dual-receptor activation may offer more effective weight management than the drugs  targeting only the GLP-1 receptor.        Discussed with Sleep Medicine Attending, Dr. Alon Avidan.         Delon Camp, MD   [place] Sleep Medicine Fellow      ORDERS PLACED FOR THIS ENCOUNTER       Orders Placed This Encounter    Sleep Study, Adult     With EMG montage     Standing Status:   Future     Expiration Date:   10/28/2024     Type of Sleep Study     (If unsure which Sleep Study is appropriate to order, consider ordering a Referral to Sleep Consultation)*:   Split Night Polysomnography (Test and Treat with PAP if baseline AHI>=15 after 2-3 hours) - CPT 95810 or 04188     Type of Sleep Study     (If unsure which Sleep Study is appropriate to order, consider ordering a Referral to Sleep Consultation)*:   Add EMG Montage     PAP Device Type::   CPAP     PAP Device Settings:   Per Sleep Lab Protocol     Sleep Lab Location:   [facility]     Indicate patient?s symptoms or reason for testing (MUST be included in Progress Note):   Excessive daytime sleepiness     Indicate patient?s symptoms or reason for testing (MUST be included in Progress Note):   Snoring     Indicate patient?s symptoms or reason for testing (MUST be included in Progress Note):   Apneas     Indicate patient?s symptoms or reason for testing (MUST be included in Progress Note):   Abnormal movement during sleep     Does the patient have a caregiver to assist with activities of daily living (ADLs)? If so, they MUST accompany patient overnight.:   Yes     If caregiver required at home, caregiver must be present during test. Identify caregiver::   Wife     Does patient have special scheduling considerations/needs?:   No     Orders Placed This Encounter Sleep Study, Adult         FOLLOW UP VISIT:       Patient should return to see me per below.    SLEEP MEDICINE CLINIC INSTRUCTIONS FOR FOLLOW UP CARE:     We asked that the patient return sooner if sleep disturbances persist.     Patient should contact us  for further input in the interim via an [employer] or by calling [phone] and requesting to speak with the Sleep Medicine Coordinator.       Delon Camp, MD   [place] [facility] Fellow      Discussed with Dr. Stanford Foster, Sleep Medicine Attending    Attending Physician Attestation Statement:    The patient was evaluated via telemedicine with the sleep fellow. I was present for the key portions of the history and abbreviated upper airway physical examination conduced via telemedicine. We formulated the assessment and plan together. I discussed the history, clinical findings and upper airway exam with the fellow and agree with the fellow's findings and plan as documented in the fellow's note.I agree with the consult note above, which reflects the details of our evaluation.    Total time on the date of the encounter was 80 minutes reviewing obtained history, counseling and educating the patient and documenting clinical information in the electronic health record.   Greater than 50% of the visit time was  spent in the review of the clinical findings, review of sleep questionnaire data, sleep log and sleep wake patterns as well as a comprehensive inventory sleep complaints. Time included discussing the differential diagnosis, diagnosis, prognosis, and treatment options. Patient was counseled about the implication of the sleep disorders, the consequences of poor and inappropriate sleep as well as the risk for insufficient seep. Counseling was provided specific to enhancing sleep duration strategies. Care coordination was reviewed outlining follow up plan.    Sincerely,    STANFORD PATRIC FOSTER, M.D., M.P.H. DIEDRA MASSE  Professor of Neurology  [facility] Department of Neurology  [employer] at [facility] (Neurology)  [address] [facility] [address] [place], [place] [address]  Phone [phone]  [phone]       SLEEP MEDICINE CLINIC COORDINATION PLAN:     SLEEP CLINIC COORDINATION    []  Follow-up in this Sleep Clinic with NO PRE-VISIT follow-up studies required. Check-out/Call Center, please schedule a routine follow-up appt.     [x]  Follow-up requested in this Sleep Clinic - AFTER checked PRE-VISIT items below are done.    Check-out/Call Center, do NOT make routine follow-up appointment.  Please create/send Telephone Encounter to Sleep Coordinator.    []  No Sleep Clinic follow-up needed.  Recommendations will be sent to your regular doctor for follow-up and management.  If you wish a follow-up appointment with Sleep Clinic, please ask your regular doctor to request the follow-up or contact the Neurology Sleep Clinic.    Required Pre-Visit Items for Sleep Clinic Return Visits:    [x]   Sleep Study.  If checked, please call the Sleep Lab (not clinic) at [phone] to schedule the Sleep Study at [facility] (an authorization may be required).  Once scheduled, notify us  as below.    []  Cognitive Behavioral Therapy for Insomnia (CBTi).  If checked, an initial CBT visit is being requested before your return visit to the Sleep Clinic.  As soon as this initial CBT visit has been scheduled, please notify us  as below.  If appropriate, also ask your CBT therapist to send a note or initial report to your Sleep Physician.     []  CPAP Usage Report (also known as DME Compliance Report) to be obtained 1 week before next visit.  If checked, this means your doctor should have a report from your CPAP machine delivered to clinic 1 week before your next visit. How this is done varies by the CPAP machine model and may require contacting the vendor.  Please check with your doctor or coordinator as below if you have questions.     []  Dental Consultation    []  Other required Pre-Visit Items:       For our patients, Instructions for Sleep Clinic Return Visits:    Contact us  at [email or web address] or [phone] as follows:    Contact us  in 7-10 business days if a sleep study is checked above AND your insurance requires pre-authorization AND you have not heard from us  on authorization approval.  We will give you an update on authorization process.  You may also contact your carrier.  Contact us  as soon as any or all of the checked items above are scheduled so we can then schedule your Sleep Return Visit to occur AFTER the items are complete and results received.    Contact us  if you reschedule ANY of above items because then we may need to reschedule  any previously existing sleep appointment.   In general, we will check within 7-10 business days to see if we can make a return appointment or not and notify you of making that appointment.  However, if we are not able to make a return appointment because required items have not yet been scheduled after that time period, you will need to contact us  to let us  know when items have been scheduled so we can make your follow-up appointment.   Contact us  for any questions.  Coordination of these items and return visits can be confusing and we are here to help.      IMPORTANT NOTE:  If checked items above have not been completed or results have not been received by your sleep doctor BEFORE your next scheduled appointment, we may contact you to reschedule existing appointments until checked items are [email or web address]  Please contact us  by [email or web address] or [phone] for any questions.

## 2023-10-29 NOTE — Patient Instructions
 SLEEP  MEDICINE WORKFLOW: NEXT STEPS    [facility]:     Dear Patient,    We look forward to helping evaluate and manage your sleep disorders. Here is a brief outline of the services we offer and details and terms describing these services:    Sleep Consultation: The sleep clinical consultation is a face-to-face/telemedicine/video consult with a sleep physician at which time it is determined what type of sleep study is needed to evaluate your sleep disorder. The Consult may be conducted by video/telemedicine or via a face- to -face visits at the [facility] Sleep Disorders Clinic:    [facility]  Sleep Disorders Clinic  [address], [address] (Directions)  [place], [place] [address]  Phone: [phone]  Fax: [phone]  [email or web address]       Sleep Study, or a Polysomnogram:  Overnight, attended sleep studies are conducted in at the [facility] (Sleep Laboratory) You're monitored (or attended) all night by a trained sleep technologist. Your doctor may recommend a sleep study to diagnose or rule out a sleep disorder such as sleep apnea, narcolepsy, parasomnias or periodic limb movement disorder. You may also undergo a sleep study if you've already been diagnosed with a sleep disorder so that we can create or adjust your treatment plan with Continuous Positive Airway Pressure (CPAP).   There are three common types of overnight sleep studies:  Split night Sleep Study: is a combination of a diagnostic and CPAP titration The first half is used to diagnose sleep apnea and then midway through the night, CPAP is started. The sleep tech spends the rest of the night adjusting the pressure until breathing has been normalized.  Diagnostic Evaluation:  measures your sleep without any intervention. It's typically used to diagnose or rule out a disorder.  Continuous Positive Airway Pressure (CPAP) Titration: a study designed to optimize and adjust the setting on the CPAP machine to determine how much air pressure is needed to normalize your breathing.    [facility] (Sleep Laboratory)  [address], [address]  [place] [address]  Phone: Camelia Eng,  [phone]  Fax: [phone]  [email or web address]    Orma Flaming offers patients personalized and secure online access to portions of their health information. It enables patients to securely use the Internet to help manage and receive information about their health, request sleep visit appointments and obtain sleep study appointments and requests.  If you have any questions or need help with your account,  please call our Patient Support Line at [phone].       SLEEP STUDY WORKFLOW    The outline below provides a step-by-step process on how sleep clinical consultation and studies are arranged:      STEP 1: Sleep Physician Consultation    The Sleep Consultation will take place via telemedicine or in person at the [facility] at [facility], (Department of Neurology) Phone [phone]  A Sleep Study may be ordered by your sleep physician following this consultation to help diagnose conditions such as sleep apnea, parasomnias, abnormal movement at night (REM sleep behavior disorder, periodic limb movements), and narcolepsy.    Determination will be made on whether the study will be conducted at the Sleep Laboratory (An In-Laboratory Nocturnal Polysomnogram vs. a Home Sleep Apnea Test, HSAT). Your insurance company may ultimately mandate this authorization.    STEP 2: Authorization for Your Sleep Study    An authorization from your  insurance (for the sleep study) usually takes about two weeks to obtain, but may range from 1 week up to  4 weeks to process.   Once an authorization is granted, you will receive a MyChart Message or phone call from the Sleep Clinic letting you know whether your sleep study has been authorized and approved or whether another sleep test is authorized.  You will be given additional instructions to schedule your sleep study.    STEP 3: Scheduling Your Sleep Study    If you do not hear back about the authorization within 3-4 week timeframe, please send the sleep coordinator a MyChart Message, or call the Sleep Clinic (physician's office) at [phone].  Once you hear back from the Sleep Disorders Clinic that an authorization for a sleep study has been granted please call the [facility] Sleep Disorder Center (sleep test site) to schedule an appointment for a sleep study.  The phone number for the [facility] Sleep Disorder Center is [phone] Option 2.  STEP 4: Arriving for Your Sleep Study at the [facility] Sleep Disorders Center    Your Sleep Study may take place at the [facility] Sleep Laboratory located at [address] [facility] [address], [place], [place] [address] ([place] area) in Parking [address].  [email or web address]    Our sleep laboratory facility on the [facility] is located within the Clinical and [employer]ner, broadcasting/film/video (CTRC), in the Center of [facility] (previously the Emergency Room in [facility]):   [email or web address]    Directions and contact information:    [email or web address]  [email or web address]    [facility] Sleep Disorders Center (Testing Center)  Phone: [phone]]  Fax: [phone]  Address: [facility] Sleep Disorders [facility])  [address], [address]  [place] [address]  [In Visitor Parking [address]]    STEP 5: Sleep Study Interpreted by Physician    Once your sleep study is complete, it will may take up to 2 weeks to score and interpret studies and report the results back to you. Please wait for a MyChart message or letter from your physician regarding the test results before scheduling a follow-up visit.  If you do not hear back from your sleep physician about your sleep study results within two weeks, please send your physician a MyChart Message, or call the Sleep Clinic (physician's office) at [phone].      STEP 6: Positive Airway Pressure (PAP) Prescription    If you have a diagnosis of sleep apnea and begin treatment with PAP during the Sleep Study, a prescription for PAP would be submitted to a dispensing facility referred to as ?Durable Medical Equipment (DME)? The DME company is determined by the patient's medical insurance and is responsible for filling the physician's prescription to provide education, fitting and to dispense PAP.  The Sleep Coordinator will contact the patient via MyChart or by phone with the details about the next step when contacting the DME facility.   The PAP is dispensed by a [employer] (DME). To eventually own the PAP unit, your insurance provider would need to verify that you use and benefit from this treatment and have a follow up with your sleep physician once you successfully initiate     STEP 7: Durable Medical Equipment Company (DME)    After receiving the PAP prescription from the Physician, the DME will contact the patient to educate, dispense and set up the equipment.   The process (from authorization, processing, and  dispensing PAP) may take between 20-to-40 days for PPO & HMO patients and between 60-to-90 days for Medi-Cal & Medicare. Some delays are inevitable due to insurance guidelines but have increased recently due to worldwide supply chain issues.   Insurance authorization for treatment is obtained by the DME company, not by Sleep Clinic/authorization team.  If any issues arise contact the Sleep Coordinator, at [phone], or send [place] a MyChart message.     STEP 8: Determining compliance with your PAP DME therapy:    After receiving your PAP, your sleep physician will work with you carefully to determine that the therapy is effective for you and is being used consistently and regularly. This is determined by monitoring your adherence with therapy and ensuring that your DME company provides your PAP ''download data,'' for review. Please work with your DME company to ensure that this data is available for your follow-up sleep clinics.  IMPORTANT: Once you begin C/PAP treatment you would need to be seen for follow-up within 30-90 days to monitor your adherence and success with therapy and identify opportunities to augment your success with therapy.  Questions regarding PAP education should be directed to your DME Supplier.  Questions regarding PAP mask fit, and pressure comfort should be directed to your sleep physician. (Please do not return your DME device without physician instruction.)  To ensure coverage for your PAP equipment, you must schedule a follow-up sleep visit with your sleep physician.   IMPORTANT: Your follow-up sleep visit should NOT occur earlier than 30 days or later than 90 days to ensure coverage for your equipment.    Billing and Insurance Questions    If you have questions regarding insurance, billing, and/or possible co?payments related to your sleep testing appointment, please contact your insurance company directly. Your insurance company has the most accurate information concerning your benefits for this procedure.     In addition to the facility charge for the sleep test, there will be an additional bill from the physician that interprets/reviews your sleep test (this is termed ?professional fee?).    For questions regarding insurance, billing, and/or possible co?payments related to your physician review of your sleep test, please contact your insurance company.    Current Procedural Terminology (CPT) Codes for Attended Polysomnography and Sleep Studies    Polysomnography; (age 23 years or older), sleep staging with 4 or more additional parameters of sleep, attended by a technologist: [phone]    Polysomnography with PAP; (age 23 years or older), sleep staging with 4 or more additional parameters of sleep, with the initiation of continuous positive airway pressure therapy or bi-level ventilation, attended by a technologist: [phone]    Multiple sleep latency or maintenance of wakefulness testing:  recording, analysis, and interpretation of physiological measurements of sleep during multiple trials to assess sleepiness or ability to remain awake: 83151    Current Procedural Terminology (CPT) Code for Home Sleep Apnea Testing:      Sleep study, unattended, simultaneous recording of heart rate, oxygen saturation, respiratory airflow and respiratory effort (e.g. thoracoabdominal movement): 95806 or [phone]    If you have any questions or concerns, please do not hesitate to send your physician a MyChart message, or call the clinic. We look forward to seeing you at your next visit and helping to address your sleep clinical needs.     [facility] Staff        [facility]    [facility] located at [facility],  [address], [place], [place] ([place] area)      Our sleep laboratory facility on the [employer] is located within the Clinical and [employer] (CTRC), in the Center of [employer] (previously the Emergency Room in [facility]). The lab will house eleven tranquil single-patient accommodations.    The lab also provides pediatric-friendly accommodations and a shared lounge, including a modern and elegant space, for patients and families.    A nationally accredited program with more than half a century experience in diagnosing and managing sleep disturbances, the [facility] Sleep Disorders Center is a recognized leader and pacesetter in the clinical practice of sleep medicine and sleep research. The Center's highly trained and experienced staff includes physicians who are Board Certified in Sleep Medicine and Registered Polysomnographic Technologists, who are proficient in evaluating and diagnosing a range of sleep difficulties, and providing patient education. The team designs personalized treatment programs to manage a broad spectrum of sleep disorders in adults and children including obstructive sleep apnea; excessive daytime sleepiness; snoring; insomnia; restless legs syndrome; parasomnias; narcolepsy; shift work sleep disorder; jet lag syndrome and various other sleep problems.    The [facility] contains 11 private bedrooms, and is equipped with modern equipment for digital polysomnography. Studies are performed 7 days per week. It is accredited by the [employer].  The Laboratory is located at the Center for [facility], [address], [address], [place] [address].        Phone: [phone],  [phone]  Fax: [phone]  Email: [email or web address]  Address  Sleep Laboratory  Center for [facility]   [address]  [place] [address] (google map)    Phone: [phone]    Fax: [phone]    Email: [email or web address]    Directions:    From [address]    Take [address] exit  Turn left at [facility]  Turn right at [employer] (at the [employer])  Turn left on [address] ([employer] will be on the right)  Stay STRAIGHT to enter the TUNNEL towards Patient and Visitor Parking (lots 18 and 27)  Turn RIGHT at the stop sign to enter the Patient and Visitor Parking (Lot 27)  Park at any spot and proceed to the pay station prior to checking in for your appointment.  Current parking rates: All day - $15; All night - $10; 2 hours - $8; 1 hour - $4  Pay stations take Credit Card or exact change only. The Sleep Center does not provide change for parking.  If you get lost, please call the lab at [phone]      From [facility]    Take the [address] west towards [facility]  Exit on the [address]  Take the [employer] [address] exit  Turn left at [employer] [address]  Turn right at [employer] (at the [employer])  Turn left on [address] ([employer] will be on the right)  Stay STRAIGHT to enter the TUNNEL towards Patient and Visitor Parking (lots 18 and 27)  Turn RIGHT at the stop sign to enter the Patient and Visitor Parking (Lot 27)  Park at any spot and proceed to the pay station prior to checking in for your appointment.  Current parking rates: All day - $15; All night - $10; 2 hours - $8; 1 hour - $4 Pay stations take Credit Card or exact change only. The Sleep Center does not provide  change for parking.  If you get lost, please call the lab at [phone]    If you have any questions or concerns, please don't hesitate to send your physician an In Basket message, or call the clinic. We look forward to seeing you at your next visit and helping to address your sleep clinical needs.     Direction and contact information:  [email or web address]  [email or web address]       Before Your Study:    Your health care provider will tell you how to prepare. Ask if you should take your usual medications. Also:  Avoid napping.  Avoid caffeine and alcohol.  Take a shower and wash your hair (don?t use hair conditioner, hair spray, and skin lotions).  Eat dinner before you come to the sleep clinic. Pack a snack if you need one before bedtime.  Bring what will make you comfortable, such as your pajamas, robe, slippers, hygiene items, and even your own pillow.    What You Can Expect:    When you arrive at the sleep clinic, you will usually be checked in by a receptionist. A specialized sleep technologist will meet you in your room. Then you may change into your nightclothes. Small sensors are placed on your head and body with tape and gel. The sensors are then plugged into a machine that will monitor your sleep. If you need to use a restroom, the sensors can be unplugged. A camera in your room will record your body movements. The technologist will stay in a nearby room. If you need to talk to him or her, use the intercom.          What a Sleep Study Does:    A sleep study monitors all the stages of your sleep. To do this, the following are recorded:  Eye movements  Heart rate, brain waves, and muscle activity  Level of oxygen in your blood  Breathing and snoring  Sudden leg or body movements    If you have breathing problems, Continuous Positive Airway Pressure (CPAP) may be used. CPAP is a device that can help you breathe by adding mild air pressure to your airway passages and improve your sleep. It may be used during the second half of your study or on another night.    Getting Your Results    The technologist can answer some of your questions about the sleep study. But only your health care provider can explain the results. He or she will have the report of your sleep study within 1 to 2 weeks. Then your treatment options can be discussed with your health care provider, or you may be referred to a sleep disorders specialist.  ? [address] [employer] [address], [place], [place] [address]. All rights reserved. This information is not intended as a substitute for professional medical care. Always follow your healthcare professional's instructions.    Monitoring Your Sleep: Sleep Lab Testing    Checking your sleep during a nighttime sleep study is often the only way to find out if you have conditions such as sleep apnea or other sleep problems. A sleep study records how your lungs, heart, brain, and other parts of your body function while you?re asleep. It?s painless, risk-free, and in most cases takes 1 full night.          Testing in a Sleep Laboratory:    If you spend the night in a Sleep Lab, you will have a private bedroom. A technician will attach  many sensors to your body, then go into another room. As you sleep, your heart rate, breathing, oxygen level, brain activity, and other functions will be tracked. A microphone and video camera will record your breathing sounds and body movements. The technician will keep watch nearby. If you need an air pressure device to help you breathe, one will be available.    Tips for Testing in a Sleep Lab:    Before your sleep study, bathe and wash your hair. Don?t use conditioners, oils, or makeup.  Stick to your normal routine. If you usually drink alcohol, exercise, or take medication before bed, ask your health care provider whether you should do so the night of your study. Most patients undergoing a sleep study should take all of their medications as they typically would at home.   Bring your toothbrush, sleepwear, pillow, something to read, and anything else that will help you sleep well.    Getting the Results  The results of your sleep study need to be scored and interpreted. Once this is done, your health care provider will discuss the findings with you. The sleep study results will show whether you have sleep apnea. It can also tell how severe the apnea is. The findings help your health care provider know which treatment or treatments may be the right ones for you.  ? 2000-2015 [employer] [address], [place], [place] [address]. All rights reserved. This information is not intended as a substitute for professional medical care. Always follow your healthcare professional's instructions.      Sleep Apnea [Adult]    Sleep Apnea (also called ?Obstructive Sleep Apnea?) is a condition where there are long pauses between breaths during sleep. This usually occurs when the tissues and muscles in the back of the throat relax too much during sleep. This causes the air passage in your throat to narrow or block off completely. Breathing slows down or stops completely and you then wake up, take a few good breaths and fall back to sleep again. There may be 10-60 such awakenings during a night. This prevents you from getting to the deeper stages of sleep that are needed for the body to rest and recover its strength.  During sleep, loud snoring, noisy breathing or gasping sounds are common. The sleep disturbance causes daytime symptoms such as difficulty getting up in the morning, need for daytime naps, irritability, poor concentration and attention.      Consequences of untreated sleep apnea are listed below:        People with untreated sleep apnea are at higher risk for the following conditions:        From: https://cdn.foreversites.com/assets/img/infographics/Sleep-Apnea-Infographic.png        Causes Of Sleep Apnea:           The main risk factor for this kind of sleep apnea is excessive weight gain. Fatty tissue gathers along the sides of the throat causing the air passage to be more narrow than normal.  Increasing age is another factor due to softening of the air passage.  Certain neck and jaw shapes are prone to a narrow air passage (such as receding chin)  Enlarged tonsils and adenoids may block the air passage when lying down  Severe nasal congestion  Use of alcohol or sedatives relaxes the muscles in the throat.  Smoking can cause inflammation and swelling in the upper air passages and make this problem worse.    Home Care  Sleep with your head and  neck in a straight or neutral position. If the neck falls back or forward too far this can block breathing.  Limit the use of alcohol and sedatives in the evening.        Follow Up  with your doctor as advised. If you are overweight, talk to your doctor about a weight loss program. If you smoke, talk to your doctor about ways to help you stop.    Get Prompt Medical Attention  if any of the following occur:  Longer pauses than usual  Unable to awaken  Seizure  ? [address] [employer]. [address], [place], [place] [address]. All rights reserved. This information is not intended as a substitute for professional medical care. Always follow your healthcare professional's instructions.        Snoring and Sleep Apnea: Notes for a Partner  Snoring and sleep apnea affect your life, as well as your partner?s. You can help in the treatment of the problem. Be supportive. Encourage your partner both to get treatment and to make the adjustments needed to follow through.    Adjusting to Changes:    Your partner?s treatment may involve making changes to certain life habits. You can help your partner make and stick with these changes. For example:  Support and even join your partner?s exercise program.  Be supportive if your partner gets CPAP (continuous positive airway pressure). He or she may feel self-conscious at first. Remind your partner to expect adjustments to CPAP before it feels just right.  Consider joining a snoring and sleep apnea support group.    Go Along to See the Health Care Provider  You can give the health care provider the best account of your partner?s nighttime breathing and snoring patterns. Try to go along to health care provider?s appointments. If you can?t go, write notes for your partner to give to the health care provider. Describe your partner?s snoring and sleep breathing patterns in detail.    Tips for Sleeping with a Snorer  Until treatment takes care of your partner?s snoring:  Try to go to bed first. It may help if you?re already asleep when your partner starts to snore.  Wear earplugs to bed. A fan or other source of background noise may also help drown out snoring.   ? 2000-2015 [employer]. [address] [place], [place] [address]. All rights reserved. This information is not intended as a substitute for professional medical care. Always follow your healthcare professional's instructions.        Continuous Positive Air Pressure (CPAP):    Continuous positive air pressure (CPAP) uses gentle air pressure to hold the airway open. CPAP is often the most effective treatment for sleep apnea and severe snoring. It works very well for many people. But keep in mind that it can take several adjustments before the setup is right for you.        How CPAP Works            CPAP is a small portable pump beside the bed. The pump sends air through a hose, which is held over your nose and/or mouth by a mask. Mild air pressure is gently pushed through your airway. The air pressure nudges sagging tissues aside. This widens the airway so you can breathe better. CPAP may be combined with other kinds of therapy for sleep apnea.      There are multiple mask options to choose from:    NASAL CPAP  FILL FACE MASK (when CPAP setting is high or to control mouth breathing)      NASAL MASK:        CPAP SPLITS THE AIRWAYS OPEN:              Types of Air Pressure Treatments    There are different types of CPAP. Your doctor or CPAP technician will help you decide which type is best for you:  Basic CPAP keeps the pressure constant all night long.  A bilevel device (BiPAP) provides more pressure when you breathe in and less when you breathe out. A BiPAP machine also may be set to provide automatic breaths to maintain breathing if you stop breathing while sleeping.  An autoCPAP device automatically adjusts pressure throughout the night and in response to changes such as body position, sleep stage, and snoring.  ? 2000-2015 [employer] [address] [place], [place] [address]. All rights reserved. This information is not intended as a substitute for professional medical care. Always follow your healthcare professional's instructions.      MANDIBULAR ADVANCEMENT DEVICES FOR OSA     For simple snoring or mild to moderate apnea, a special mouthpiece may help. A dental specialist works with your health care provider to build and fit a mouthpiece just for you. A follow-up sleep study checks how well the device is working for you. Mouthpieces are also called oral appliances.          Moving the Jaw Forward    Most mouthpieces move the jaw and tongue forward. That keeps the tongue from blocking the airway. Mouthpieces can work well, but they are not for everyone. Work with your health care provider to get a mouthpiece that fits just right for you. And avoid over-the-counter mouthpieces -- they often do not work.    Tips  To have the most success with your mouthpiece, keep these tips in mind:  It will take some time to get used to wearing a mouthpiece. At first it may feel uncomfortable or make your mouth water. If these problems last, tell your health care provider.  Expect several rounds of adjustments to get the mouthpiece to fit and work just right for you.  Mouthpieces don?t cure the problems that cause snoring or sleep apnea. So you need to use your mouthpiece all night, every night.  Follow your health care provider?s instructions for keeping the mouthpiece clean.  When you?re not wearing your mouthpiece, store it in its case.  ? 2000-2015 [employer] [address], [place], [place] [address]. All rights reserved. This information is not intended as a substitute for professional medical care. Always follow your healthcare professional's instructions.      Surgical Treatment for Snoring and Sleep Apnea  The goal of most surgeries for breathing problems is to widen the airway. This is done by taking out or shrinking excess tissue where the mouth meets the throat. Nasal and jaw surgery can help correct nose or jaw problems that contribute to snoring and apnea. This sheet describes procedures that may be recommended for you.    This is the most common surgical procedure for sleep apnea. It trims the soft palate and uvula, and removes the tonsils and other tissue. It is major surgery performed in a hospital. Most patients go home within 24 hours.    NASAL SURGERY:    Problems in the nose can make snoring or sleep apnea worse. They can also make CPAP (a common  treatment for snoring and sleep apnea) harder to use. If blockages in your nose are severe, surgery can improve the airflow. It can reduce the size of the turbinates, straighten a deviated septum, and remove any polyps (overgrowths of sinus lining).    Risks and Complications of Nasal Surgery  Bruising  Bleeding  Damage to or perforation of septum  Dryness in nose      Snoring and Sleep Apnea: Notes for a Partner  Snoring and sleep apnea affect your life, as well as your partner?s. You can help in the treatment of the problem. Be supportive. Encourage your partner both to get treatment and to make the adjustments needed to follow through.    Adjusting to Changes    Your partner?s treatment may involve making changes to certain life habits. You can help your partner make and stick with these changes. For example:  Support and even join your partner?s exercise program.  Be supportive if your partner gets CPAP (continuous positive airway pressure). He or she may feel self-conscious at first. Remind your partner to expect adjustments to CPAP before it feels just right.  Consider joining a snoring and sleep apnea support group.    Go Along to See the Health Care Provider    You can give the health care provider the best account of your partner?s nighttime breathing and snoring patterns. Try to go along to health care provider?s appointments. If you can?t go, write notes for your partner to give to the health care provider. Describe your partner?s snoring and sleep breathing patterns in detail.    Tips for Sleeping with a Snorer  Until treatment takes care of your partner?s snoring:  Try to go to bed first. It may help if you?re already asleep when your partner starts to snore.  Wear earplugs to bed. A fan or other source of background noise may also help drown out snoring.   ? 2000-2015 [employer] [address], [place], [place] [address]. All rights reserved. This information is not intended as a substitute for professional medical care. Always follow your healthcare professional's instructions.    SLEEP APNEA: NATURAL HISTORY    What Are Snoring and Sleep Apnea?    Sleep Apnea [Adult]    What is sleep apnea? -- Sleep apnea is a condition that makes you stop breathing for short periods while you are asleep. There are 2 types of sleep apnea. One is called ''obstructive sleep apnea,'' and the other is called ''central sleep apnea.''    In obstructive sleep apnea, you stop breathing because your throat narrows or closes (figure 1). In central sleep apnea, you stop breathing because your brain does not send the right signals to your muscles to make you breathe. When people talk about sleep apnea, they are usually referring to obstructive sleep apnea, which is what this article is about.    People with sleep apnea do not know that they stop breathing when they are asleep. But they do sometimes wake up startled or gasping for breath. They also often hear from loved ones that they snore.    What are the symptoms of sleep apnea? -- The main symptoms of sleep apnea are loud snoring, tiredness, and daytime sleepiness. Other symptoms can include:    ?Restless sleep  ?Waking up choking or gasping  ?Morning headaches, dry mouth, or sore throat  ?Waking up often to urinate  ?Waking up feeling unrested or groggy  ?Trouble thinking clearly or remembering things  Some people with sleep apnea don't have symptoms, or they don't know they have them. They might figure that it's normal to be tired or to snore a lot.    Should I see a doctor or nurse? -- Yes. If you think you might have sleep apnea, see your doctor.    Is there a test for sleep apnea? -- Yes. If your doctor or nurse suspects you have sleep apnea, he or she might send you for a ''sleep study.'' Sleep studies can sometimes be done at home, but they are usually done in a sleep lab. For the study, you spend the night in the lab, and you are hooked up to different machines that monitor your heart rate, breathing, and other body functions. The results of the test will tell your doctor or nurse if you have the disorder.    Is there anything I can do on my own to help my sleep apnea? -- Yes. Here are some things that might help:    ?Stay off your back when sleeping. (This is not always practical, because people cannot control their position while asleep. Plus, it only helps some people.)  ?Lose weight, if you are overweight  ?Avoid alcohol, because it can make sleep apnea worse  How is sleep apnea treated? -- As mentioned above, weight loss can help if you are overweight or obese. But losing weight can be challenging, and it takes time to lose enough weight to help with your sleep apnea. Most people need other treatment while they work on losing weight.    The most effective treatment for sleep apnea is a device that keeps your airway open while you sleep. Treatment with this device is called ''continuous positive airway pressure,'' or CPAP. People getting CPAP wear a face mask at night that keeps them breathing (figure 2).    If your doctor or nurse recommends a CPAP machine, try to be patient about using it. The mask might seem uncomfortable to wear at first, and the machine might seem noisy, but using the machine can really pay off. People with sleep apnea who use a CPAP machine feel more rested and generally feel better.    There is also another device that you wear in your mouth called an ''oral appliance'' or ''mandibular advancement device.'' It also helps keep your airway open while you sleep.    In rare cases, when nothing else helps, doctors recommend surgery to keep the airway open. Surgery to do this is not always effective, and even when it is, the problem can come back.    Is sleep apnea dangerous? -- It can be. People with sleep apnea do not get good-quality sleep, so they are often tired and not alert. This puts them at risk for car accidents and other types of accidents. Plus, studies show that people with sleep apnea are more likely than others to have high blood pressure, heart attacks, and other serious heart problems. In people with severe sleep apnea, getting treated (for example, with a CPAP machine) can help prevent some of these problems.  Airway in a person with sleep apnea        Normally when a person sleeps, the airway remains open, and air can pass from the nose and mouth to the lungs. In a person with sleep apnea, parts of the throat and mouth drop into the airway and block off the flow of air. This can cause loud snoring and interrupt breathing for short periods.  Sleep Apnea (also called ?Obstructive Sleep Apnea?) is a condition where there are long pauses between breaths during sleep. This usually occurs when the tissues and muscles in the back of the throat relax too much during sleep. This causes the air passage in your throat to narrow or block off completely. Breathing slows down or stops completely and you then wake up, take a few good breaths and fall back to sleep again. There may be 10-60 such awakenings during a night. This prevents you from getting to the deeper stages of sleep that are needed for the body to rest and recover its strength.  During sleep, loud snoring, noisy breathing or gasping sounds are common. The sleep disturbance causes daytime symptoms such as difficulty getting up in the morning, need for daytime naps, irritability, poor concentration and attention.    Causes Of Sleep Apnea   The main risk factor for this kind of sleep apnea is excessive weight gain. Fatty tissue gathers along the sides of the throat causing the air passage to be more narrow than normal.  Increasing age is another factor due to softening of the air passage.  Certain neck and jaw shapes are prone to a narrow air passage (such as receding chin)  Enlarged tonsils and adenoids may block the air passage when lying down  Severe nasal congestion  Use of alcohol or sedatives relaxes the muscles in the throat.  Smoking can cause inflammation and swelling in the upper air passages and make this problem worse.    Home Care  Sleep with your head and neck in a straight or neutral position. If the neck falls back or forward too far this can block breathing.  Limit the use of alcohol and sedatives in the evening.  Follow Up  with your doctor as advised. If you are overweight, talk to your doctor about a weight loss program. If you smoke, talk to your doctor about ways to help you stop.    Get Prompt Medical Attention  if any of the following occur:  Longer pauses than usual  Unable to awaken  Seizure  ? [address] [employer] [address], [place], [place] [address]. All rights reserved. This information is not intended as a substitute for professional medical care. Always follow your healthcare professional's instructions.    bstructive sleep apnoea is a chronic condition with multiple potential associations with cardiometabolic sequelae. There is increasing awareness and recognition of OSA in our society today, because of the accumulating evidence of its contribution to atheroslerosis45. It has been considered as a systemic problem or a clinical manifestation of the metabolic syndrome, comprising a cluster of cardiometabolic risk factors, namely, hypertension, insulin resistance, dyslipidaemia and obesity46. Early observational studies in the 1980s, looked for the prevalence of OSA in different ethnic populations, and some longitudinal studies became more informative in time to define the natural history and associated risk factors with an increased prevalence in different subsets of the population. The 4-year [place] Sleep Cohort [id number] and the recent Sleep Heart [facility] [phone] were the landmark studies of the impact of body weight changes on the severity of sleep apnoea. The overall incidence of moderate to severe OSA over a 5-year period was 11.1 per cent in men and 4.9 in per cent women. Men with >10 kg weight gain over the follow up period had five-fold risk of increasing their AHI by > 15. In contrast, for the same amount of weight gain, women had two and half fold risk  of a similar increment in their severity of sleep apnoea47. given the epidemic of obesity, and different cohorts of the effects of body weight changes on OSA, OSA patients are likely to be overweight or obese at presentation48. Hence, obesity does have a major impact on the evolution of sleep apnoea.  There have been increasing interest in the research on OSA and its cardiometabolic complications during the last 10 years. OSA is considered to be a long- standing illness and the associated complications seem to impose an economic burden in our society, affecting both developing and developed countries all over the world. There is evidence that OSA is associated with ischaemic heart disease, hypertension, stroke, arrhythmia, coagulability, diabetes mellitus, endothelial dysfunction and inflammation49, and the implications of future research in these areas are highly encouraged in order to look into the general public health burden.      Obstructive sleep apnea, or simply sleep apnea, can cause fragmented sleep and low blood oxygen levels. For people with sleep apnea, the combination of disturbed sleep and oxygen starvation may lead to hypertension, heart disease and mood and memory problems. Sleep apnea also increases the risk of drowsy driving.    Who Has Sleep Apnea?  More than 18 million [place] adults have sleep apnea. It is very difficult at present to estimate the prevalence of childhood OSA because of widely varying monitoring techniques, but a minimum prevalence of 2 to 3% is likely, with prevalence as high as 10 to 20% in habitually snoring children. OSA occurs in all age groups and both sexes.    What Causes Sleep Apnea?  There are a number of factors that increase risk, including having a small upper airway (or large tongue, tonsils or uvula), being overweight, having a recessed chin, small jaw or a large overbite, a large neck size (17 inches or greater in a man, or 16 inches or greater in a woman), smoking and alcohol use, being age 59 or older, and ethnicity (African-Americans, Pacific-Islanders and Hispanics). Also, OSA seems to run in some families, suggesting a possible genetic basis.    Sleep Apnea Symptoms  Chronic snoring is a strong indicator of sleep apnea and should be evaluated by a health professional. Since people with sleep apnea tend to be sleep deprived, they may suffer from sleeplessness and a wide range of other symptoms such as difficulty concentrating, depression, irritability, sexual dysfunction, learning and memory difficulties, and falling asleep while at work, on the phone, or driving. Left untreated, symptoms of sleep apnea can include disturbed sleep, excessive sleepiness during the day, high blood pressure, heart attack, congestive heart failure, cardiac arrhythmia, stroke or depression.    Treatment for Sleep Apnea  If you suspect you may have sleep apnea, the first thing to do is see your doctor. Bring with you a record of your sleep, fatigue levels throughout the day, and any other symptoms you might be having. Ask your bed partner if he or she notices that you snore heavily, choke, gasp, or stop breathing during sleep. Be sure to take an updated list of medications, including over the counter medications, with you any time you visit a doctor for the first time. You may want to call your medical insurance provider to find out if a referral is needed for a visit to a sleep center.    One of the most common methods used to diagnose sleep apnea is a sleep study, which may require an overnight stay at a sleep center. The sleep study monitors  a variety of functions during sleep including sleep state, eye movement, muscle activity, heart rate, respiratory effort, airflow, and blood oxygen levels. This test is used both to diagnose sleep apnea and to determine its severity. Sometimes, treatment can be started during the first night in the sleep center.    The treatment of choice for obstructive sleep apnea is continuous positive airway pressure device (CPAP). CPAP is a mask that fits over the nose and/or mouth, and gently blows air into the airway to help keep it open during sleep. This method of treatment is highly effective. Using the CPAP as recommended by your doctor is very important.    Other methods of treating sleep apnea include: dental appliances which reposition the lower jaw and tongue; upper airway surgery to remove tissue in the airway; nasal expiratory positive airway pressure where a disposable valve covers the nostrils; and treatment using hypoglossal nerve stimulation where a stimulator is implanted in the patient?s chest with leads connected to the hypoglossal nerve that controls tongue movement as well as to a breathing sensor. The sensor monitors breathing patterns during sleep and stimulates the hypoglossal nerve to move the tongue to maintain an open airway.    Lifestyle changes are effective ways of mitigating symptoms of sleep apnea. Here are some tips that may help reduce apnea severity:    Lose weight. If you are overweight, this is the most important action you can take to cure your sleep apnea (CPAP only treats it; weight loss can cure it in the overweight person).  Avoid alcohol; it causes frequent nighttime awakenings, and makes the upper airway breathing muscles relax.  Quit smoking. Cigarette smoking worsens swelling in the upper airway, making apnea (and snoring) worse.  Some patients with mild sleep apnea or heavy snoring have fewer breathing problems when they are lying on their sides instead of their backs.    Coping with Sleep Apnea  The most important part of treatment for people with OSA is using the CPAP whenever they sleep. The health benefits of this therapy can be enormous, but only if used correctly. If you are having problems adjusting your CPAP or you?re experiencing side effects of wearing the appliance, talk to the doctor who prescribed it and ask for assistance.    Getting adequate sleep is essential to maintaining health in OSA patients. If you have symptoms of insomnia such as difficulty falling asleep, staying asleep, or waking up unrefreshed, talk to your doctor about treatment options. Keep in mind that certain store-purchased and prescription sleep aids may impair breathing in OSA patients. One exception is ramelteon, which was studied in mild and moderate OSA patients and found to not harm their breathing.    \  Continuous Positive Airway Pressure (CPAP)  Your healthcare provider has prescribed continuous positive airway pressure (CPAP) therapy for you. A CPAP device helps you breathe better at night. The device sends air through your nose or mouth when you breathe in to keep your air passages open. CPAP is:  Used most often to treat sleep apnea and some other problems. (Sleep apnea is a chronic condition with periods of sleep in which you briefly stop breathing.)  Safe and very effective. But it takes time to get used to the mask.  Your healthcare provider, nurse, or medical supplier will give you tips for wearing and caring for your CPAP device.        General guidelines  Recommendations include the following:  It's very important not to give  up! It takes time to get used to wearing the mask at night.  Practice using your CPAP device during the day, especially whenever you take a nap.  Remember, there are several different types of masks. If you can?t get used to your mask, ask your provider or medical supply company about trying another style.  If you have nasal stuffiness or dryness when using your CPAP device, talk with your provider or medical supply company. There are ways to ease these problems. For example, your provider may recommend using a moistening nasal spray. Or the medical supply company may recommend a device with a humidifier.  The goal is to use your CPAP all night, every night, during all naps, and even when you travel.  Keep your mask clean. Wash it with soap and water. Be sure to rinse the mask and tubing well with water to remove any soap. Let them air-dry completely before using.  Make yourself comfortable when sleeping with CPAP. Try using extra pillows.  Work with your medical supply company so that you know how to correctly use your CPAP. The company's representative will be able to help you:  Use the CPAP correctly  Troubleshoot any problems that come up  Learn to clean and maintain the device  Adjust to regular use of the CPAP     The CPAP device settings are given as centimeters of water, or cm/H2O. Each person?s pressure settings are different. Your healthcare provider will tell you what settings to use. Never change your CPAP pressure setting unless your provider tells you to.  CPAP ____________cm/H20 pressure when you breathe in       Continuous positive airway pressure (CPAP) is the frontline treatment for obstructive sleep apnea. CPAP keeps your airway open during the night by gently providing a constant stream of air through a mask you wear while you sleep. This will eliminate the breathing pauses caused by sleep apnea, so you will no longer snore or make choking noises in your sleep. You will be able to sleep through the night without your body waking up from lack of oxygen.     When you use CPAP, you will feel more alert during the daytime. Your mood will improve and you will have a better memory. CPAP prevents or even reverses serious health problems associated with sleep apnea such as heart disease and stroke. Your partner may even sleep better because you will stop snoring.     CPAP comes with a machine, a hose for air and a mask. Most machines are small - about the size of a tissue box - lightweight and relatively quiet. You can keep the CPAP machine on your nightstand or at the side of your bed.     A long hose connects the CPAP machine to the mask. Air travels from the machine?s motor, through the hose and into the mask. Most hoses are long so that you can move around or turn over in your bed.     The CPAP mask may cover just your nose, your nose and mouth or fit in your nostrils. No matter what type of mask you use, it is important that it fits well and is comfortable. The mask must make a seal in order to keep your airway open through the night. A good mask seal will prevent air leaks and maintain the right level of air pressure.     The amount of air pressure need for CPAP to treat sleep apnea depends on the person.  A board certified sleep medicine physician may recommend a CPAP titration study to calibrate your CPAP. Most CPAP units also come with a timed pressure ?ramp? setting. This starts the airflow at a very low level, so you can fall asleep comfortably. The setting then slowly raises the pressure while you sleep until it reaches the right level to treat your sleep apnea.     CPAP is a lifestyle change. It works best when used every night, for the whole time you are sleeping. You should also use CPAP when you are napping. Just one night without the treatment can negatively affect your blood pressure. The more you use CPAP, the better you will feel.    Health Risk Prevention    CPAP can prevent or reverse serious consequences of obstructive sleep apnea. The treatment can help protect you from these serious health risks:     Heart disease    By treating your sleep apnea, you can reduce your risk of heart disease. Sleep apnea is linked to a variety of heart problems because it causes you to stop breathing many times each night. These pauses cause changes in the pressure on your heart and can cause changes in your blood oxygen levels. This puts an enormous strain on your heart.      People with untreated sleep apnea have a higher rate of death from heart disease than those without sleep apnea or with treated sleep apnea. Using CPAP over an extended period of time can protect you from heart problems and reduce your chance of dying from them. This includes:  Congestive heart failure  Coronary artery disease  Irregular heartbeat    Stroke    If you have sleep apnea, consistent CPAP use can reduce your risk of stroke, one of the leading causes of death and long-term disability. A stroke is a sudden loss in brain function. It occurs when there is a blockage or rupture in one of the blood vessels leading to the brain. People with untreated sleep apnea are two to four times more likely to have a stroke.     Diabetes    Using CPAP to treat your sleep apnea can improve insulin sensitivity. Sleep apnea is related to glucose intolerance and insulin resistance, both factors in type 2 diabetes. Untreated sleep apnea increases your risk of getting type 2 diabetes.     Motor Vehicle Accidents    CPAP can help you become a safer driver by reducing your daytime fatigue. Untreated sleep apnea makes you 15 times more likely to be involved in a deadline crash. Many people with sleep apnea have a hard time staying awake and concentrating while driving.     Benefits to Your Health and Well-Being     Using CPAP to treat your sleep apnea can improve your life and make each day better. It can help improve your:     Daytime Alertness    Sleepiness and daytime fatigue are common symptoms of sleep apnea. CPAP can restore your normal sleep pattern and increase your total sleep time by eliminating breathing pauses in your sleep. This will help you wake up feeling more refreshed and boost your energy throughout the day.    CPAP - Side Effects        CPAP has relatively few side effects. Most of the problems people experience happen after they first begin the treatment, and are easily fixed through simple adjustments:     Irritation of the eyes and face  This is often due to a poor mask fit. By readjusting or switching the type of mask that you use, you can eliminate these symptoms.     Dry nose and sore throat    A humidifier attached to your CPAP unit reduces dry nose and sore throat by providing cool or heated moisture to the air that blows down your throat.     Nasal congestion, runny nose and sneezing    Use a saline nasal spray to ease mild nasal congestion. A nasal decongestant can help will more severe nasal or sinus congestion. In severe cases, prescription medications can be used to help adjust to CPAP.     Nightmares and excessive dreaming    This typically occurs only during the early stages of CPAP use.     Most other side effects are relatively rare.       Concentration    Untreated sleep apnea can make you feel cloudy-headed because it interferes with your cognitive functioning. Using CPAP may improve your ability to think, concentrate and make decisions. This can also improve your productivity and decrease your chance of making a costly mistake at work.      Emotional Stability    Untreated sleep apnea is linked with depression. CPAP can help improve your mood, reduce your risk of depression and improve your overall quality of life.     Snoring    By keeping your airway open as you sleep, CPAP can also reduce or eliminate the sound of your snoring. While you may not notice, you bed partner will benefit from a quieter sleep environment.      Medical Expenses    People with untreated sleep apnea usually have higher medical expenses. Treatment with CPAP can reduce medical expenses, even when you factor in the cost of CPAP. Sleep apnea can lead to more health problems and more doctors? visits. Treatment for serious health risks linked to sleep apnea such as heart disease, stroke and diabetes can be costly. Medical expenses will decrease when you use CPAP to treat your sleep apnea.    Types of PAP therapy    There are several forms of PAP therapy other than CPAP. All forms of PAP therapy keep the airway open during the night by providing a stream of air:    APAP  Automatically-adjusting Positive Airway Pressure (APAP) therapy automatically raises or lowers the air pressure as needed during the night.      BPAP  BPAP devices have two alternating levels of pressure. When you breathe in air the pressure rises. The pressure decreases as you breathe out. If you have a problem with CPAP or APAP, the board-certified sleep medicine physician may recommend BPAP. He or she may also suggest BPAP if you have sleep apnea along with another breathing disorder.      Types of CPAP Masks      There are three common types of masks for CPAP. No matter what type of mask you use, it is important that it fits well and is comfortable.    Nasal mask  This mask only covers your nose. This is the most common type of CPAP mask.       Full face mask  This mask covers both your nose and your mouth. This type of mask may help if you have air leaks when using a nasal mask.      Nasal pillows  This mask uses soft silicone tubes that fit directly in your nose. This may  help if you have air leaks or don?t like the feeling of a mask over your nose and face. Some users claim nasal pillows give them a better sense of freedom.      Humidifiers     Humidifiers for CPAP can help reduce side effects associated with the treatment and make it easier for you to breathing through your mask. Some people may have nasal irritation or drainage from using CPAP. A humidifier can reduce these side effects by providing cool or heated moisture to the air coming from the CPAP unit. Many modern CPAP units come with a humidifier connected with the machine.    Tips for CPAP        It may take some time for you to become comfortable with using CPAP. Follow these tips to improve your quality of sleep with CPAP:    Begin using your CPAP for short periods of time during the day while you watch TV or read    This will help you get used to wearing your mask. It will feel more natural when you are trying to fall asleep.     Make CPAP part of your bedtime routine.    Use CPAP every night and for every nap. Using CPAP less often reduces its health benefits and makes it more difficult for your body to adjust to the therapy.    Make small adjustments to increase your level of comfort    Adjust your mask, tubing straps and headgear until you get the fit right. You can also try using a special bed pillow that is shaped for a PAP mask and/or hose.     Make sure your mask is a good fit. The most common problems with CPAP occur when the mask does not fit properly.    If the mask is too big, the straps holding it to your face will need to be pulled tightly. This may irritate your skin or lead to sores as the straps rub against your face. A mask that is too small will not seal properly and air will leak out through the edges. The air may blow into your eyes. If you are having either problem, you may need a different mask or headgear.    If the pressure feels too high as you are trying to fall asleep, use the ?ramp? mode on your CPAP unit    Ramp mode will start your device on a low pressure setting and gradually increases the pressure over time. You should be able to fall asleep before the air pressure reaches its proper level.     Use a saline nasal spray to ease mild nasal congestion    Nasal congestion can be a problem with CPAP treatment. A nasal spray or decongestant can help with nasal or sinus congestion.     Use a humidifier if you have a dry mouth, throat or nose    Some CPAP devices have heated humidifiers, which are chambers filled with water on a heater-plate. This feature ensures that you are breathing warm, moist air through your mask. If you are using a heated humidifier and the tubing fills with water, turn down the heat on the humidifier and keep the PAP machine at a level lower than your head.     Place foam under your CPAP machine to dampen the sound    This may help if you find the sound of the CPAP machine to be annoying. A mouse pad also works great as a  noise dampener.     Schedule a regular time to clean your equipment    Clean your mask, tubing and headgear once a week. Put this time in your schedule so that you don?t forget to do it.     If you are having problems remembering to use your CPAP every night, find someone to help    Considering joining a support group or asking someone you trust to hold you accountable for using your CPAP.     If these adjustments do not work, consult your board-certified sleep medicine physician.    You may need a different mask or device or you may need the air pressure adjusted. Some patients may also benefit from cognitive-behavioral therapy. This can help you identify and overcome what is preventing you from getting a good night?s sleep with CPAP.      PAP Cleaning Equipment Instructions    Mask:  Clean once a week at a minimum with mild soap and water and let air dry  Replace cushion every 4-6 weeks    Humidifier Chamber:  Clean once a week at a minimum with mild soap and water and let air dry  Use distilled water only while using during sleep    Tubing:  Clean once a week at a minimum with mild soap and water  Hang dry    Filters:    1. [employer] Respironics:  Dark Blue Non Disposable Filter:   Rinse at least once a week with soap/water  Replace every 3-6 months  Disposable Filter (light blue or purple):   Replace every 4-6 weeks   2. [employer] Respironics:  Paulette Blanch:  Rinse at least once a week with soap/water  Replace every 3-6 months  Disposable Filter (white):   Replace every 4-6 weeks   3. ResMed Airsense 10 Auto Set and S9 Auto Set:  Change every 4-6 weeks    General Recommendations   Use warm soapy water to clean your equipment  Ivory liquid soap or [employer]  Once a month soak mask and water chamber for 30 minutes with 1 part vinegar and 2 parts water    What about surgery?      While positive airway pressure therapy is the first line and gold standard of treatment for sleep apnea, in very selected cases, surgery may have a role.  The sites of obstruction could be anywhere in the upper respiratory tract including the nose, tongue, and throat.  Below are the most common  surgical methods to address these potential sites of obstruction.    Nasal surgery    Both daytime nasal obstruction and nocturnal nasal congestion have been shown as risk factors for sleep-disordered breathing. Therefore, the treatment of nasal obstruction plays an important role in sleep apnea surgery.  Three anatomic areas of the nose that may contribute to obstruction are the septum, the turbinates, and the nasal valve. The most common nasal surgical procedure consists of septoplasty and turbinate reduction.  This is an outpatient procedure that is well tolerated by most patients. It consists of straightening out the septum and reducing the size of the turbinates.  This procedure creates more room in the nose and allows air to pass smoothly and without effort.  For some patients, there is also nasal valve collapse. This is due to weakness of the lower nasal cartilages that hold open the nostrils. For patients who have this issue, the deviated cartilage that is removed from the septum can be strategically placed to  strengthen the valve and prevent collapse.    UPPP    UPPP, or in full, uvulopalatopharyngoplasty was developed to remove excess tissue from the soft palate and pharynx.  The tonsils are also removed if present.  After removing the tissue, sutures are placed to keep the area widely open and prevent collapse. This area of the upper airway is referred as the oropharynx, and is a common site of obstruction in the majority of patients who suffer from sleep apnea.   This surgery requires an overnight stay in the hospital, as the recovery can be painful for up to one week.  This procedure does NOT guarantee a cure from sleep apnea and many patient continue to use their CPAP.    Soft palate implants (the Pillar Procedure)    The Pillar Procedure is a minimally invasive approach that can help with snoring and VERY mild cases of sleep apnea. It involves the placement of three polyester rods into the soft palate.  The rods initiate an inflammatory response of the surrounding soft tissues that results in a slight stiffening of the soft palate. The stiffer soft palate is less likely to make contact with the back wall of the pharynx during deep stages of sleep as the muscles relax; snoring and apnea are subsequently reduced.  This procedure can be done under local anesthesia in the clinic with the patient awake.    Lower jaw advancement and Hyoid advancement    The hyoid bone is a small bone in the neck where the muscles of the tongue base and pharynx attach.  Patients with sleep apnea often have a large tongue base.  During the deep stages of sleep, normal muscle tone is relaxed, and the base of tongue falls back and can make contact with the back wall of the pharynx resulting in obstruction.  Through a very minimally invasive procedure, the hyoid bone is surgically repositioned anteriorly by placing a suture around it and suspending it to the front of the jaw bone.  Abnormality of the maxillofacial skeleton is a well-recognized risk factor of obstructive sleep apnea. Sleep apnea patients usually have small, narrow jaws that result in diminished airway dimension, which leads to nocturnal obstruction.  Maxillomandibular advancement achieves enlargement of the entire upper airway through expansion of the skeletal framework that encircle the airway.  The procedure consists of mobilizing the upper and lower jaw bones, and advancing then up to 10-7mm. The jaw bones are stabilized with titanium plates in the advanced position.  This procedure is technically very challenging as the bone cuts need to be precise, and the positioning of the teeth to match correctly after the advancement is critical. Patients have to have their teeth wired shut for several weeks while the bones heal.  While the surgery can be painful and require a several night hospital stay, the long term success rates are generally good.  Very few surgeons and medical centers perform this procedure frequently due to the increased surgical risks and potential for complications.    Upper Airway Stimulation    What is it and how does it work?  An upper airway stimulation approach to sleep apnea works inside the patient?s body. It involves a small implantable pacemaker-like device which stabilizes the throat while sleeping by providing gentle stimulation to throat muscles and allowing the airway to remain open during sleep.        The device gets implanted along the right side of the neck and chest wall during a two or  three hour long procedure under general anesthesia. After four weeks the device gets turned by the physician and patients use a small remote control to turn the device on at night and off in the morning when they wake up. The small sensor in the chest wall monitors the patient's breathing and sends mild stimulation to the hypoglossal nerve to prevent the tongue from blocking the airway.            How well does it work?    Several studies have reported on safety and efficacy of upper airway stimulation with good patient satisfaction scores, however this is an invasive surgical procedure and must be considered only for very selected patients who must meet several characteristics:    Be 53 years of age or older  Have moderate to severe OSA (AHI range from 15-65 with <25% central/ mixed apneas)  Be unable to use a Continuous positive airway pressure (CPAP) machine  Be free of complete concentric collapse at the palate, which is determined by a head a neck surgeon during a sleep endoscopy exam (DISE)  The therapy is not for people who are significantly overweight and it does NOT offer guarantee a cure or future improvement/protection from sleep apnea and does not guarantee that patients will not need to use CPAP in the future.     Summary    Sleep apnea surgery may be helpful for very selected patients. It takes a combination of procedures to achieve success.  A logical step-wise approach much be taken when a patient seeks surgery, and it is a requisite that the patient find a surgeon who understands both the pathophysiology of sleep apnea and the anatomy of the upper respiratory tract to ensure the best chance of success. Surgery has risks and does not protect people from needing CPAP therapy.        Sleep Hygiene    Helpful Hints to Help You Sleep    Poor sleep habits (referred to as hygiene) are among the most common problems encountered in our society. We stay up too late and get up too early. We interrupt our sleep with drugs, chemicals and work, and we overstimulate ourselves with late-night activities such as television.   Below are some essentials of good sleep habits. Many of these points will seem like common sense. But it is surprising how many of these important points are ignored by many of [place]. Click on any of the links below for more information:    Your Personal Habits     Fix a bedtime and an awakening time. Do not be one of those people who allows bedtime and awakening time to drift. The body ''gets used'' to falling asleep at a certain time, but only if this is relatively fixed. Even if you are retired or not working, this is an essential component of good sleeping habits.    Avoid napping during the day. If you nap throughout the day, it is no wonder that you will not be able to sleep at night. The late afternoon for most people is a ''sleepy time.'' Many people will take a nap at that time. This is generally not a bad thing to do, provided you limit the nap to 30-45 minutes and can sleep well at night.    Avoid alcohol 4-6 hours before bedtime. Many people believe that alcohol helps them sleep. While alcohol has an immediate sleep-inducing effect, a few hours later as the alcohol levels in your blood start to fall, there  is a stimulant or wake-up effect.    Avoid caffeine 4-6 hours before bedtime. This includes caffeinated beverages such as coffee, tea and many sodas, as well as chocolate, so be careful.    Avoid heavy, spicy, or sugary foods 4-6 hours before bedtime. These can affect your ability to stay asleep.    Exercise regularly, but not right before bed. Regular exercise, particularly in the afternoon, can help deepen sleep. Strenuous exercise within the 2 hours before bedtime, however, can decrease your ability to fall asleep.    Your Sleeping Environment    Use comfortable bedding. Uncomfortable bedding can prevent good sleep. Evaluate whether or not this is a source of your problem, and make appropriate changes.    Find a comfortable temperature setting for sleeping and keep the room well ventilated. If your bedroom is too cold or too hot, it can keep you awake. A cool (not cold) bedroom is often the most conducive to sleep.    Block out all distracting noise, and eliminate as much light as possible.    Reserve the bed for sleep and sex. Don't use the bed as an office, workroom or recreation room. Let your body ''know'' that the bed is associated with sleeping.    Getting Ready For Bed    Try a light snack before bed. Warm milk and foods high in the amino acid tryptophan, such as bananas, may help you to sleep.    Practice relaxation techniques before bed. Relaxation techniques such as yoga, deep breathing and others may help relieve anxiety and reduce muscle tension.     Don't take your worries to bed. Leave your worries about job, school, daily life, etc., behind when you go to bed. Some people find it useful to assign a ''worry period'' during the evening or late afternoon to deal with these issues.    Establish a pre-sleep ritual. Pre-sleep rituals, such as a warm bath or a few minutes of reading, can help you sleep.    Get into your favorite sleeping position. If you don't fall asleep within 15-30 minutes, get up, go into another room, and read until sleepy.    Getting Up in the Middle of the Night    Most people wake up one or two times a night for various reasons. If you find that you get up in the middle of night and cannot get back to sleep within 15-20 minutes, then do not remain in the bed ''trying hard'' to sleep. Get out of bed. Leave the bedroom. Read, have a light snack, do some quiet activity, or take a bath. You will generally find that you can get back to sleep 20 minutes or so later. Do not perform challenging or engaging activity such as office work, housework, Catering manager. Do not watch television.    A Word About Television    Many people fall asleep with the television on in their room. Watching television before bedtime is often a bad idea. Television is a very engaging medium that tends to keep people up. We generally recommend that the television not be in the bedroom. At the appropriate bedtime, the TV should be turned off and the patient should go to bed. Some people find that the radio helps them go to sleep. Since radio is a less engaging medium than TV, this is probably a good idea.    Other Factors    Several physical factors are known to upset sleep. These include arthritis, acid reflux with heartburn, menstruation,  headaches and hot flashes.     Psychological and mental health problems like depression, anxiety and stress are often associated with sleeping difficulty. In many cases, difficulty staying asleep may be the only presenting sign of depression. A physician should be consulted about these issues to help determine the problem and the best treatment.    Many medications can cause sleeplessness as a side effect. Ask your doctor or pharmacist if medications you are taking can lead to sleeplessness.    To help overall improvement in sleep patterns, your doctor may prescribe sleep medications for short-term relief of a sleep problem. The decision to take sleeping aids is a medical one to be made in the context of your overall health picture.    Always follow the advice of your physician and other       Sleep Hygiene 101: How to Improve Your Sleep Patterns    Every New Year the sleep-related resolutions being made seem to disappear within a few weeks. Most of the time we make resolutions without understanding how our habits affect our sleep.  I ask my patients to follow proper Sleep Hygiene in order to get better sleep, and when they do, their success in making and keeping those resolutions is much greater. Some of the basic Sleep Hygiene recommendations include:  TV or not TV, that is the question. Many of my patients sheepishly tell me that they sleep with the TV on, well, so does my wife! If it helps then I am fine with it, but put on a TV timer so it does not keep anyone up the rest of the night.  Wean the Bean. Drink higher caffeinated beverages in the morning and move to lower ones in the mid afternoon. Try to move to fruit juices and water by 2-3 pm. While many of my patients tell me ''I can drink an espresso and go to bed'' they may be correct but the sleep they are getting is more jittery than they are!  A Glass for a Glass. If you are drinking alcohol remember that while it may make you fall asleep quickly, it prevents you from reaching deep sleep. Drink one glass of water for every glass of alcohol. Not only will it slow down your drinking but it will prevent you from getting dehydrated which is why you get a hangover!  Watch out for the Double Whammy. If you are going out with friends and have a few drinks try to get to bed on time! Most people do both, they stay out late and they drink too much-no wonder they feel horrible in the am.  Timing is everything. Some people get relaxed from exercise while others get energized, which one are you? If running relaxes you then do it about 4 hours before bed.    SLEEP 101:     Sleep is actually a combination of 2 systems, your sleep drive (like hunger) and a biological clock that tells you when to sleep. When both are working well together, you sleep best. This is why a regular sleep schedule is so important. Interestingly enough, with all the sleep research out there, we still do not understand why we sleep. But we sure know what happens when we don't: disaster. And when we do get great sleep, really good things happen (increased immune system, look better, weight loss, and increased performance).    Here are the five questions (and answers with tips!) to ask yourself to help improve your sleep:  1. How can my sleep be better?    Answer: There are two main aspects to sleep that most of [place] want to improve: the quantity (the number of minutes of sleep) and the quality (how refreshing is the sleep that you get).   The easiest way to increase your quantity of sleep is to give yourself a bedtime. When was the last time you had a bedtime? When you were [age], and someone else made you go to bed?? Many of my patients tell me that they get involved with something (TV, the internet, a good book) and then ''don't know where the time went, '' look up and it is 1 am.   Here is a quick trick: Set your alarm clock for 8 hours before you are supposed to wake up (waking at 7am? Set the alarm for 11pm). This forces you into your room to turn it off and should remind you that it is time for bed. Try to get in bed within the next 30 minutes.  The easiest way to increase the quality of your sleep is to reduce caffeine. Caffeine can have a half-life (the time it stays effective in your system) of between 8 and 10 hours! I am not saying to give it up (you wouldn't listen anyway), but try a method called caffeine fading: have your highest caffeinated beverage in the morning and then slowly move to less caffeinated beverages throughout the day. By 2:30 p.m. try to be caffeine free.    2. How can I fall asleep more quickly?    Answer: There are many things that prevent people from falling asleep but two that are quite common include: your body is not ready (sleep is not just an on/off switch it needs time to unwind) and your mind is racing (when you get in bed this may be the first opportunity you've have all day to think or focus on things that matter to you!) To help fall asleep more quickly:   The easiest way to get your body ready for sleep is a HOT bath about an hour before bed. Make it a bubble bath with lavender aromatherapy (the bubbles keep the water hotter longer and lavender helps with relaxation and sleep.  The easiest way to prevent your mind from racing is to distract yourself. My easy quick trick here is to count backwards from 300 by 3's. It is complicated enough that you cannot think of anything else, and it is so boring, you are out like a light.    3. Am I doing something to keep myself awake in the middle of the night?  Answer: There are several things that can wake [place] up and keep [place] up at night that are both biological and psychological.   One of the easiest things to help you go back to sleep, biologically: Do not turn on the lights! Lights makes your brain think that is it morning, and makes it hard to return to sleep. Put a night light in your bedroom, hallway or bathroom. That way you can get to where you are going, without telling your brain it is morning.  One of the easiest things to help you go back to sleep, psychologically: Do not look at the clock. Who really cares how much time you have left to sleep, just go back to sleep. Watching the clock only causes added anxiety about the next day: something we do not need at night. Turn it around and do not look!  4. Could my bedroom be affecting my sleep?  Answer: YES! It is critical to have an environment that is conducive to better sleep.   One of the easiest ways to have a healthy sleep environment: Get the right equipment. Just like exercise, the right equipment will help performance. A great start for the New Year is a new pillow. If you have not bought one in the last year, it is time!  Another way to better your sleep environment: Set the mood. When you walk in to your bedroom, how does it make you feel? Relaxed? Tense?! What could you add, or take away that might help your bedroom be more tranquil. Dim the lights one hour before bed, and never bring your laptop into your bedroom.    5. What can I do during the day to set myself up for good sleep at night?  Answer: There are many different activities you do during the day that will affect your sleep at night. The top two: what you consume and your activity level.   You are what you eat and what you eat affects how you sleep. Try to limit meals to three hours before lights out, but do not go to bed hungry. If you must eat late make it a lighter meal, with simple carbohydrates.  Get Moving. Exercise (specifically 20-30 minutes of aerobic) is one of the single best ways to improve the quality of your sleep. Research shows that those who have a regular exercise program get deeper sleep. Try not to exercise less than 2 hours before bed.  So there you have it. My five questions to ask yourself and the quick fixes to help you out with your Sleep Hygiene as you look at your sleep and take the Sleep Challenge this year! Remember, everything you do, you do better with a good night's rest.    Sleep Hygiene 101: How to Improve Your Sleep Patterns    Every New Year the sleep-related resolutions being made seem to disappear within a few weeks. Most of the time we make resolutions without understanding how our habits affect our sleep.  I ask my patients to follow proper Sleep Hygiene in order to get better sleep, and when they do, their success in making and keeping those resolutions is much greater. Some of the basic Sleep Hygiene recommendations include:  TV or not TV, that is the question. Many of my patients sheepishly tell me that they sleep with the TV on, well, so does my wife! If it helps then I am fine with it, but put on a TV timer so it does not keep anyone up the rest of the night.  Wean the Bean. Drink higher caffeinated beverages in the morning and move to lower ones in the mid afternoon. Try to move to fruit juices and water by 2-3 pm. While many of my patients tell me ''I can drink an espresso and go to bed'' they may be correct but the sleep they are getting is more jittery than they are!  A Glass for a Glass. If you are drinking alcohol remember that while it may make you fall asleep quickly, it prevents you from reaching deep sleep. Drink one glass of water for every glass of alcohol. Not only will it slow down your drinking but it will prevent you from getting dehydrated which is why you get a hangover!  Watch out for the Double Whammy. If you are going out with friends and have a few  drinks try to get to bed on time! Most people do both, they stay out late and they drink too much-no wonder they feel horrible in the am.  Timing is everything. Some people get relaxed from exercise while others get energized, which one are you? If running relaxes you then do it about 4 hours before bed.  SLEEP 101: Sleep is actually a combination of 2 systems, your sleep drive (like hunger) and a biological clock that tells you when to sleep. When both are working well together, you sleep best. This is why a regular sleep schedule is so important. Interestingly enough, with all the sleep research out there, we still do not understand why we sleep. But we sure know what happens when we don't: disaster. And when we do get great sleep, really good things happen (increased immune system, look better, weight loss, and increased performance).  Here are the five questions (and answers with tips!) to ask yourself to help improve your sleep:  1. How can my sleep be better?  Answer: There are two main aspects to sleep that most of [place] want to improve: the quantity (the number of minutes of sleep) and the quality (how refreshing is the sleep that you get).   The easiest way to increase your quantity of sleep is to give yourself a bedtime. When was the last time you had a bedtime? When you were [age], and someone else made you go to bed?? Many of my patients tell me that they get involved with something (TV, the internet, a good book) and then ''don't know where the time went, '' look up and it is 1 am.   Here is a quick trick: Set your alarm clock for 8 hours before you are supposed to wake up (waking at 7am? Set the alarm for 11pm). This forces you into your room to turn it off and should remind you that it is time for bed. Try to get in bed within the next 30 minutes.  The easiest way to increase the quality of your sleep is to reduce caffeine. Caffeine can have a half-life (the time it stays effective in your system) of between 8 and 10 hours! I am not saying to give it up (you wouldn't listen anyway), but try a method called caffeine fading: have your highest caffeinated beverage in the morning and then slowly move to less caffeinated beverages throughout the day. By 2:30 p.m. try to be caffeine free.    2. How can I fall asleep more quickly?  Answer: There are many things that prevent people from falling asleep but two that are quite common include: your body is not ready (sleep is not just an on/off switch it needs time to unwind) and your mind is racing (when you get in bed this may be the first opportunity you've have all day to think or focus on things that matter to you!) To help fall asleep more quickly:   The easiest way to get your body ready for sleep is a HOT bath about an hour before bed. Make it a bubble bath with lavender aromatherapy (the bubbles keep the water hotter longer and lavender helps with relaxation and sleep.  The easiest way to prevent your mind from racing is to distract yourself. My easy quick trick here is to count backwards from 300 by 3's. It is complicated enough that you cannot think of anything else, and it is so boring, you are out like a light.  3. Am I doing something to keep myself awake in the middle of the night?  Answer: There are several things that can wake [place] up and keep [place] up at night that are both biological and psychological.   One of the easiest things to help you go back to sleep, biologically: Do not turn on the lights! Lights makes your brain think that is it morning, and makes it hard to return to sleep. Put a night light in your bedroom, hallway or bathroom. That way you can get to where you are going, without telling your brain it is morning.  One of the easiest things to help you go back to sleep, psychologically: Do not look at the clock. Who really cares how much time you have left to sleep, just go back to sleep. Watching the clock only causes added anxiety about the next day: something we do not need at night. Turn it around and do not look!    4. Could my bedroom be affecting my sleep?  Answer: YES! It is critical to have an environment that is conducive to better sleep.   One of the easiest ways to have a healthy sleep environment: Get the right equipment. Just like exercise, the right equipment will help performance. A great start for the New Year is a new pillow. If you have not bought one in the last year, it is time!  Another way to better your sleep environment: Set the mood. When you walk in to your bedroom, how does it make you feel? Relaxed? Tense?! What could you add, or take away that might help your bedroom be more tranquil. Dim the lights one hour before bed, and never bring your laptop into your bedroom.    5. What can I do during the day to set myself up for good sleep at night?  Answer: There are many different activities you do during the day that will affect your sleep at night. The top two: what you consume and your activity level.   You are what you eat and what you eat affects how you sleep. Try to limit meals to three hours before lights out, but do not go to bed hungry. If you must eat late make it a lighter meal, with simple carbohydrates.  Get Moving. Exercise (specifically 20-30 minutes of aerobic) is one of the single best ways to improve the quality of your sleep. Research shows that those who have a regular exercise program get deeper sleep. Try not to exercise less than 2 hours before bed.  So there you have it. My five questions to ask yourself and the quick fixes to help you out with your Sleep Hygiene as you look at your sleep and take the Sleep Challenge this year! Remember, everything you do, you do better with a good night's rest.   Sleep Hygiene    Helpful Hints to Help You Sleep  Poor sleep habits (referred to as hygiene) are among the most common problems encountered in our society. We stay up too late and get up too early. We interrupt our sleep with drugs, chemicals and work, and we overstimulate ourselves with late-night activities such as television.   Below are some essentials of good sleep habits. Many of these points will seem like common sense. But it is surprising how many of these important points are ignored by many of [place]. Click on any of the links below for more information:  Your Personal Habits   Fix a bedtime  and an awakening time. Do not be one of those people who allows bedtime and awakening time to drift. The body ''gets used'' to falling asleep at a certain time, but only if this is relatively fixed. Even if you are retired or not working, this is an essential component of good sleeping habits.    Avoid napping during the day. If you nap throughout the day, it is no wonder that you will not be able to sleep at night. The late afternoon for most people is a ''sleepy time.'' Many people will take a nap at that time. This is generally not a bad thing to do, provided you limit the nap to 30-45 minutes and can sleep well at night.    Avoid alcohol 4-6 hours before bedtime. Many people believe that alcohol helps them sleep. While alcohol has an immediate sleep-inducing effect, a few hours later as the alcohol levels in your blood start to fall, there is a stimulant or wake-up effect.    Avoid caffeine 4-6 hours before bedtime. This includes caffeinated beverages such as coffee, tea and many sodas, as well as chocolate, so be careful.    Avoid heavy, spicy, or sugary foods 4-6 hours before bedtime. These can affect your ability to stay asleep.    Exercise regularly, but not right before bed. Regular exercise, particularly in the afternoon, can help deepen sleep. Strenuous exercise within the 2 hours before bedtime, however, can decrease your ability to fall asleep.  Your Sleeping Environment  Use comfortable bedding. Uncomfortable bedding can prevent good sleep. Evaluate whether or not this is a source of your problem, and make appropriate changes.    Find a comfortable temperature setting for sleeping and keep the room well ventilated. If your bedroom is too cold or too hot, it can keep you awake. A cool (not cold) bedroom is often the most conducive to sleep.    Block out all distracting noise, and eliminate as much light as possible.    Reserve the bed for sleep and sex. Don't use the bed as an office, workroom or recreation room. Let your body ''know'' that the bed is associated with sleeping.  Getting Ready For Bed  Try a light snack before bed. Warm milk and foods high in the amino acid tryptophan, such as bananas, may help you to sleep.    Practice relaxation techniques before bed. Relaxation techniques such as yoga, deep breathing and others may help relieve anxiety and reduce muscle tension.     Don't take your worries to bed. Leave your worries about job, school, daily life, etc., behind when you go to bed. Some people find it useful to assign a ''worry period'' during the evening or late afternoon to deal with these issues.    Establish a pre-sleep ritual. Pre-sleep rituals, such as a warm bath or a few minutes of reading, can help you sleep.    Get into your favorite sleeping position. If you don't fall asleep within 15-30 minutes, get up, go into another room, and read until sleepy.  Getting Up in the Middle of the Night  Most people wake up one or two times a night for various reasons. If you find that you get up in the middle of night and cannot get back to sleep within 15-20 minutes, then do not remain in the bed ''trying hard'' to sleep. Get out of bed. Leave the bedroom. Read, have a light snack, do some quiet activity, or take a bath. You will  generally find that you can get back to sleep 20 minutes or so later. Do not perform challenging or engaging activity such as office work, housework, Catering manager. Do not watch television.  A Word About Television  Many people fall asleep with the television on in their room. Watching television before bedtime is often a bad idea. Television is a very engaging medium that tends to keep people up. We generally recommend that the television not be in the bedroom. At the appropriate bedtime, the TV should be turned off and the patient should go to bed. Some people find that the radio helps them go to sleep. Since radio is a less engaging medium than TV, this is probably a good idea.  Other Factors  Several physical factors are known to upset sleep. These include arthritis, acid reflux with heartburn, menstruation, headaches and hot flashes.     Psychological and mental health problems like depression, anxiety and stress are often associated with sleeping difficulty. In many cases, difficulty staying asleep may be the only presenting sign of depression. A physician should be consulted about these issues to help determine the problem and the best treatment.    Many medications can cause sleeplessness as a side effect. Ask your doctor or pharmacist if medications you are taking can lead to sleeplessness.    To help overall improvement in sleep patterns, your doctor may prescribe sleep medications for short-term relief of a sleep problem. The decision to take sleeping aids is a medical one to be made in the context of your overall health picture.    Always follow the advice of your physician and other       The Power of Naps    The ideal number of sleep hours is seven.     A recent study by the Centers for Disease Control and Prevention (CDC) links too little sleep (six hours or less) and too much sleep (10 or more hours) with chronic diseases in older adults, including coronary heart disease, diabetes, anxiety and obesity.              Also, research published in the journal [employer] One (June, 2014) found that sleeping six or fewer hours and more than eight was associated with memory problems and executive function, such as problem solving and planning and execution, in older adults age 33 to 63. Other research has shown that people who sleep nine to 10 hours per night have a greater risk of mortality compared with those who sleep the more standard fewer than eight hours.                 Quality mattersgood news is that most seniors manage to get 7.25 hours of sleep on average. The problem is that their quality of sleep may be poor. The causes can be numerous.  Other common sleep issues that can cause poor sleep or insomnia and sleepiness during the day include sleep apnea, restless leg syndrome, or periodic leg movement disorder (frequent leg jerks at night), and circadian rhythm disorders--misalignment of a person?s internal 24-hour body clock. (You should see your doctor if you experience these problems. If you have insomnia, talk with him or her about sleeping medication or cognitive and behavioral therapy.)                Once the cycle of poor sleep is in place, it is difficult to break. If a person wakes up tired they may end up falling asleep for a brief time  late in the day or in the early evening. This makes it difficult for them to fall asleep or stay asleep during the night. They wake up feeling fatigued and the entire cycle begins again.              However, afternoon naps can help correct this problem. Naps in the right duration help to combat the effects of sleepiness without interfering with nighttime sleep.     Health Boost with Nappingimproving overall sleep quality, napping specifically can help improve other aspects of your health. For instance:              Improve Your Memory. Scientists at the [employer] [employer] [place] [facility] [place] examined memory tests of people who took naps compared to those who did not and found nappers scored about 10 percent higher. The theory is that sleep helps to clear out the ?clutter? in your hippocampus, the part of the brain linked to memory function.  When you awake from a nap, you have more space to process and retain new information.              Protect Against Heart Disease. Regular naps also can save your heart. A study of approximately 24,000 men and women up to age 20 found that those who took midday naps lowered their chance of dying from heart disease by 34 percent. Those who napped at least three times per week reduced their risk by 37 percent.  Increase Creativity. Research has found that during a nap, the brain activity in the right hemisphere--linked to creative tasks, such as visualization and big-picture thinking--stays active while the left side remains mostly quiet. Therefore when you awake the creative side is more organized and prepared for original thoughts.         Nap the Right Waylong should you nap? In order to maximize napping benefits, you should sleep for 15 to 20 minutes. You want to avoid waking during the deep REM (rapid eye movement) cycle, which begins after about 30 minutes of sleep and can make you feel groggy.              The prime napping time?falls between 1 p.m. and 3 p.m. This is when you might experience post-lunch sleepiness or a lower level of alertness. Still, a 20-minute power nap can be one of the best prescriptions for improving both body and mind.  What You Can Dosure you naps are truly restful.  Sleep in [employer] and [employer]. Naps should follow the same protocol as your regular sleeping. Lie in your dark bedroom free from distractions. Wear an eye mask to help block out light or listen to soft   Set an Alarm. This will ensure you don?t sleep beyond the extended time.  Stay for the Full Time. Even if you do not drift off, stay there until the time is over. Lying still, relaxed, with your eyes closed can still rest your brain.   Keep a consistent sleep schedule ...  Create a relaxing bedtime routine -- and stick with it ...  Turn off electronic devices before you go to sleep ...  Exercise regularly ...  Limit your caffeine intake ...  Make your sleep environment work for you ...  Use your bed only for sleep and sex ...  Go to bed only when you?re tired

## 2023-10-29 NOTE — Progress Notes
 VIDEO VISIT QUESTIONNAIRE     Patient Consent to Telehealth Questionnaire: Agreed by patient  Location: [name] home  Duration: 14 Minutes     Patient Consent to Telehealth   The patient agreed to participate in the video visit prior to joining the visit.    - I agree  to be treated via a video visit and acknowledge that I may be liable for any relevant copays or coinsurance depending on my insurance plan.  - I understand that this video visit is offered for my convenience and I am able to cancel and reschedule for an in-person appointment if I desire.  - I also acknowledge that sensitive medical information may be discussed during this video visit appointment and that it is my responsibility to locate myself in a location that ensures privacy to my own level of comfort.  - I also acknowledge that I should not be participating in a video visit in a way that could cause danger to myself or to those around me (such as driving or walking).  If my provider is concerned about my safety, I understand that they have the right to terminate the visit.        REFERRING PHYSICIAN, PRIMARY CARE PHYSICIAN        Jeana Francisco, MD   [address]  [place] [address]     [place] Dorn [place]., [place]  [address]  [address]  [place],  [place] [address]       CHIEF COMPLAINT     Reason for Visit:   Chief Complaint   Patient presents with    Snoring         SUBJECTIVE:      [name]      HISTORY OF PRESENT ILLNESS:        Zaylon Bossier is a 68 y.o. male with a  has a past medical history of Antral ulcer (09/2023), CVA (cerebral vascular accident) (HCC/RAF), H/O aortic valve replacement, Kidney stone, and Urinary frequency. , atrial fibrillation on apixaban , HTN, prediabetes.     Patient was recently hospitalized 6/9-6/21/25 found to have tachy-brady syndrome s/p PPM 6/14, hospital course c/b melena and found to have antral ulceration s/p 2 hemoclips 6/17. Patient was discharged 6/21 on apixaban . He was readmitted 6/22-6/29/25 for melena s/p EGD with hemoclip and 1u pRBC transfusion.     Wife present during video visit. During admission, patient was told he had low oxygen levels during sleep and had witnessed apneic events. Patient was told upon discharge to get a sleep study and that he might have sleep apnea. He has noticed that he has had daytime sleepiness for few years. Patient has been disabled since 2013 since his stroke and has residual left sided weakness. He does not drive.     SLEEP COMPLAINTS & SYMPTOM INVENTORY:     Symptom Details   Sleep   Disordered  Breathing  [x]  Snoring    [x]  Apneas    [x]  Choking spells   Insomnia  []  Sleep onset  difficulties   []  Sleep maintenance problems   []  Early morning awkening    []  Sleep-wake schedule abnormalities   Abnormal   Movements/  Behaviors  []  Urge to move limbs    []  Dream enactment behaviors    []  Amnestic complex behaviors       []  Disruptive leg jerks         Sleep -Wake Schedule Patterns:   []   Usual bedtime: 11pm   []         Sleep latency: 15 min   []         Subjective wake after sleep onset:   []         Nocturnal awakenings: 4x to use restroom   []         Approximate wake after sleep onset:   []         Time of final awakening: 7am   []         Napping (time, duration): 30 min couple times a week, 1pm   []         Weekend variation:yes      Sleep Symptoms:   [x]         Patient endorses snoring during sleep   []         Reports trouble focusing and concentrating,    []         Excessive daytime sleepiness (EDS) impairing performance.    []         EDS adversely affects quality of life   [x]         Daytime fatigue and non-restorative sleep   []         Reports morning headache   [x]         Reports wakening with dry mouth   []         Reports daily caffeine consumption   []         Reports sleepiness with driving   []         Reports motor vehicle collisions due to drowsy driving      Sleep Disordered Breathing:   [x]         Snoring   [x]  Apnea- like events or choking spells   []         Dyspnea   [x]         Morning headaches or confusion/AM sleep inertia.   []         Coexisting lung disease     []         Coexisting heart disease       Narcolepsy Symptoms:    [x]         Excessive daytime sleepiness (see Epworth Sleepiness Score)   []   Sudden urges of sleep attacks   []   Cataplexy-like spells   []         Sudden loss of strength in legs / arms during day, often precipitated by strong emotions such as laughter or fright)   []   Sleep paralysis events   []   Hallucinations: hypnagogic/hypnopompic hallucination-events.   []   Fragmented nocturnal sleep   []   Automatic behaviors        Motor Behaviors of sleep:      When you try to relax in the evening or sleep at night, do you ever have unpleasant, restless feelings in your legs that can be relieved by walking or movement?    []        Urge or compulsion  to move the legs/limbs   []        Urge to move is accompanied by an uncomfortable sensation in the legs   []        Symptoms are worse with rest or inactivity (lying down or sitting)   []        Symptoms are worse later in the day and in the evening   []        Symptoms are relieved by movement of the limbs/leg   []   Worse with rest or inactivity (lying down or sitting)   []        Worse later in the day and in the evening   []        Symptoms are relieved by movement of the legs   []   Leg symptoms/movements   []   Motor restlessness         Screening for other movement events during sleep:   []   Bruxism   []   Sensation of periodic limb movement         Parasomnia and complex nocturnal behaviors:    []   Night terrors   []   Automatic behaviors   []   Dream enactment    []   Anosmia, orthostatic hypotension, constipation, depression   []         NREM Parasomnia Events: Recurrent persistent confusional   arousal, night eating, sleep walking or sleep terrors   []         REM Parasomnia Events: Denies dream enactment; injuries      Allergic Rhinitis Screen:   []  Symptoms of allergic rhinitis including nasal congestion, post-nasal drip, rhinorrhea, sneezing, throat clearing cough, itchy eyes: No      GERD Screen:   []         Symptoms suggestive of gastroesophageal reflux including    heartburn, sour brash, bitter taste when awakening: no      Other subjective complaints:   []         Anxiety or rumination   []   Pain and discomfort at night   []         Waking up with heart pounding or racing      Question [name] Response    Are you currently satisfied with your sleep? yes   Are you tired or sleepy during the daytime? yes     Question [name] Response   Do you snore loudly or stop breathing while sleeping? yes   Do you consider yourself to be overweight? Yes, working on weight loss   Do you feel sleepy when working or while driving a car? N/a- patient has been disabled since 2013 and does not drive   Do you often have difficulty falling asleep or staying asleep? no   When you try to relax in the evening or sleep at night, do you have unpleasant, restless feelings in your legs that can be relieved by walking or movement? no   Do you act out your dreams while sleeping? No- but has involuntary twitching on left side, residual effects from previous stroke 2013   Do you have a variable bedtime or awakening time? no     Bed partner collateral data   []        Sleeping separately because of snoring  []        Motor behaviors  []        Complex behaviors         Current view: Showing all answers       Legend:           Triggered an [employer]  Scoring question                           [facility] Patient Location Tracking For Video Visits       Question 10/22/2023 10:32 AM PDT - Fredricka by Patient    At the time of your visit, will you be in a healthcare facility? This may include being in a hospital, nursing home  or rehab facility. If you will physically be in one of those facilities, please select Yes. If you are in a private residence (even if it is not your own home), a senior center, independent living, board and care, or assisted living, select No. No          [facility]       Question 10/29/2023  7:43 AM PDT - Fredricka by Patient    Constitutional     Fever       Weight loss       Fatigue Yes    Eye problems     Blurred vision       Double vision       Loss of vision       Eye pain       Eye redness       Eye dryness       Ear/Nose/Throat     Trouble hearing       Ringing in ear(s)       Dizziness (vertigo)       Loss of balance Yes    Ear pain       Ear discharge       Hoarseness       Trouble swallowing       Slurred speech       Cardiovascular     Chest pain       Irregular heart beat       Fast heart beat       Limb swelling       Limb pain on walking       Fainting       Respiratory     Trouble breathing       Chronic cough       Coughing blood       Gastrointestinal     Indigestion       Heart burn       Abdominal pain       Nausea       Vomiting       Regurgitation       Diarrhea       Constipation       Bloody stools       Musculoskeletal     Muscle pain       Muscle cramp       Muscle twitches Yes    Loss of muscle bulk       Neck pain       Back pain       Joint pain       Joint stiffness       Joint swelling       Skin & Breast     Numbness       Tingling       Discoloration       Neurologic     Headache       Face pain       Face numbness       Weakness       Tremors       Clumsiness       Blackouts       Trouble with memory       Trouble concentrating       Psychiatric     Hallucinations       Feeling depressed       Trouble sleeping       Suicidal thoughts  Inappropriate crying       Inappropriate laughing       Hematologic/Lymphatic     Abnormal bleeding       Nose bleeds       Lumps or swellings       Allergic/Immunologic     Skin rash       Joint pain       Dry eyes &/or dry mouth       Endocrine     Excessive thirst       Heat or cold intolerance       Excessive urination       Please specify relationship to the patient Family    Please use this space to explain, if needed.             [facility] Adult Short Form V1.0 Sleep Disturbance       Question 10/29/2023  7:51 AM PDT - Filed by Patient    In the past 7 days     I was satisfied with my sleep. Somewhat    My sleep was refreshing. Somewhat    I had a problem with my sleep. Quite a bit    I had difficulty falling asleep. Not at all    My sleep was restless. Not at all    I tried hard to get to sleep. Somewhat    I worried about not being able to fall asleep. Quite a bit    My sleep quality was... Fair    PROM[facility] Adult Short Form-Sleep Disturbance Score (range: 29 - 77) 52.2          [facility] Part 1       Question 10/29/2023  8:03 AM PDT - Fredricka by Patient    What is your usual health status? My activity is physically restricted    Do you feel that you: Have trouble getting a good night's sleep?     Have non-refreshing sleep?     Are sleepy during the day?     Are tired (fatigued) during the day?    What time do you usually go to bed? 11:00 pm    Does this time vary? Yes    How long does it usually take you to fall asleep? 15 minutes    On average, how many hours of sleep do you get each night? 6 hours    When trying to fall asleep, how often do you have thoughts racing through your mind? Sometimes    When trying to fall asleep, how often do you feel sad or depressed? Sometimes    When trying to fall asleep, how often do you have anxiety/worry about things? Sometimes    When trying to fall asleep, how often do you feel muscular tension? Sometimes    When trying to fall asleep, how often do you feel afraid of not being able to sleep? Sometimes    When trying to fall asleep, how often do you feel unable to move? Never    When trying to fall asleep, how often do you have a creeping, crawling, aching or twitching in your limbs making you feel you have to move them? Sometimes    When trying to fall asleep, how often do you have any kind of pain or discomfort? Never    When trying to fall asleep, how often do you feel afraid of the dark or anything else? Never    When trying to fall asleep, how often do you  feel afraid you won't return to sleep? Sometimes    How many times do you usually awaken each night? 4    Do you have trouble getting back to sleep? no    On a typical night, what is your longest period of wakefulness? 15 minutes    How long are you awake all together during the night? 30 minutes    Do you usually wake up during the first or latter part of the night? first    How often do you sleep with someone else in your bed? Sometimes    How often do you sleep with someone else in your room? Often    How often do you have restless, disturbed sleep? Sometimes    How often do you get up at night to attend to your children? Never    How often do you snore loudly and/or disruptively? Often    How often do you hold your breath or stop breathing while you sleep? Sometimes    How often do you have nasal congestion during the night? Sometimes    How often do you suddenly awaken gasping for air? Sometimes    How often do you have some other breathing problem at night? Sometimes    How often do you feel your heart pounding during the night? Never    How often do you sweat a lot during the night? Never    How often do you walk in your sleep? Never    How often do you fall out of bed while asleep? Never    How often do you wake up screaming, violent, or confused? Never    How often do you have unusual movements while asleep? Sometimes    How often do you wet the bed?       How often do you grind your teeth at night? Never    How often do you have a night full of intense, vivid dreams? Sometimes    How often do you have nightmares? Sometimes    How often do you wake up from a dream? Sometimes    How often do you have racing thoughts during your sleep? Sometimes    How often do you have a recurring dream that disturbs your sleep? Sometimes    What time do you usually wake up in the morning? 7:00 am    What time do you usually get out of bed? 8:00 am    How much does your final awakening vary? don't understand    How often do you depend on an alarm clock to wake you up? Sometimes    How often do you ''sleep in'' in the morning (more than one hour past your normal time to get up)? Sometimes    How often do you have a very hard time waking up? Sometimes    How often do you feel unable to move when waking up? Never    How often do you have dream-like images when waking up even when you know that you are not sleeping? Sometimes          [facility] Part 2       Question 10/29/2023  8:11 AM PDT - Fredricka by Patient    What is your usual health status? My activity is physically restricted    How often do you wake up confused or disoriented? Never    How often do you wake up with a headache? Never    How often do  you wake up with a dry mouth? Often    Have you ever slept or been overwhelmingly sleepy for several days at a time? No    Have you ever been unable to sleep for several days at a time? No    Do you feel that your sleep is abnormal? Yes    If you have or have had a sleep or sleepiness problem, was it worse at any time in the past? No    Have you ever had any trouble with sleep during your childhood? No    How long does it usually take you to get going after you get out of bed? 1 hour    How often do you feel extremely alert and energetic during the whole day? Sometimes    How great a problem do you have with fatigue (tiredness, exhaustion, or lethargy) even when you are not sleepy? Sometimes    How great of a problem do you have with sleepiness (sleepy or struggling to stay awake) in the daytime? Sometimes    How often do  you fall asleep unintentionally? Please give an example.       How often do you feel sad or depressed? Sometimes    How often do you feel muscular tension? Sometimes    How often do you feel weakness in your muscles when laughing, being surprised, angry, excited, etc.? Sometimes    How great of a problem do/did you have with your education because of sleepiness/fatigue?       How often have you had a problem with your performance at work because of sleepiness/fatigue? Never    How many times have you ever had accidents at work because of sleepless nights? Never    How many times have you been involved in automobile accidents? n/a    How many near miss auto accidents have you had due to sleepiness/fatigue? n/a    How many rest (not sleeping) periods do you usually take in a weekday? n/a    How many times in a usual weekday do you try to take a nap but can't sleep? n/a    How long is your nap and/or rest periods? n/a    Do you feel refreshed after your naps/rest periods? n/a    How long do you feel refreshed after a nap/rest period? n/a    Choose the most appropriate answer - While sitting and reading: Slight chance of dozing    Choose the most appropriate answer - While watching TV: Moderate chance of dozing    Choose the most appropriate answer - While sitting inactive in a public place (theater, meeting, etc.): Slight chance of dozing    Choose the most appropriate answer - As a passenger in a car for an hour without a break: Slight chance of dozing    Choose the most appropriate answer - Lying down to rest in the afternoon when able: Slight chance of dozing    How often do you have vivid dreams during a nap? Sometimes    How often do you experience vivid dream like images while falling asleep or awakening from a nap even though you know you are still awake? Sometimes    How often do you feel unable to move while falling asleep or waking up? Sometimes    How often do you discover that you have performed a complex act such as driving a car, and not remembered how you did it? Never    How often do  you find yourself doing things that make no sense (such as writing nonsense or mixing chocolate and gravy)? Never    How bad is the pain? The pain is severe    Did you have an injury that caused the pain? Yes, the pain started after an injury Please describe the circumstances of your injury broke an ankle    Have you ever been diagnosed with osteoporosis or any other bone weakness? Yes    Was your injury related to your job? No    How long has the pain been present? More than 1 week but less then 4 weeks    Do you have a fever? No, I do not have a fever    Have you ever been diagnosed with cancer? No    Have you ever been diagnosed with arthritis? No          [facility] Epworth Sleepiness Scale (Ess)       Question 10/29/2023  8:12 AM PDT - Fredricka by Patient    How likely are you to doze off or fall asleep in the following situations, in contrast to feeling just tired?     Sitting and reading high chance of dozing    Watching TV high chance of dozing    Sitting inactive in a public place (e.g. a theater or a meeting) high chance of dozing    As a passenger in a car for an hour without a break high chance of dozing    Lying down to rest in the afternoon when circumstances permit high chance of dozing    Sitting and talking to someone high chance of dozing    Sitting quietly after a lunch without alcohol high chance of dozing    In a car, while stopped for a few minutes in traffic high chance of dozing    Epworth Sleepiness Scale Score (range: 0 - 24) 24                 *+    PAST MEDICAL HISTORY:      Active Ambulatory Problems     Diagnosis Date Noted    Abnormality of gait 01/09/2012    Hx of medication noncompliance 01/11/2012    Muscle weakness (generalized) 01/11/2012    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) 01/11/2012    Hypertension 03/11/2012    Gout 03/11/2012    Seborrheic dermatitis 03/11/2012    Urinary frequency     H/O aortic valve replacement     Bicuspid aortic valve 06/09/2020    Age related osteoporosis 07/20/2022    Prediabetes 07/20/2022    H/O bicuspid aortic valve 08/09/2023    H/O: CVA (cerebrovascular accident) 08/09/2023    AF (paroxysmal atrial fibrillation) (HCC/RAF) 10/09/2023    Tachy-brady syndrome (HCC/RAF) 10/09/2023    Cardiac pacemaker in situ 10/09/2023     Resolved Ambulatory Problems     Diagnosis Date Noted    Muscle weakness 01/09/2012    Aortic stenosis 03/11/2012    History of aortic valve replacement 03/11/2012    CVA (cerebral vascular accident) (HCC/RAF)      Past Medical History:   Diagnosis Date    Antral ulcer 09/2023    Kidney stone        PAST SURGICAL HISTORY:      Past Surgical History:   Procedure Laterality Date    AORTIC VALVE REPLACEMENT      CARDIAC PACEMAKER PLACEMENT  09/2023    ESOPHAGOGASTRODUODENOSCOPY  09/2023  MEDICATIONS:          Current Outpatient Medications (Pain Meds):     ALLOPURINOL  100 mg tablet, TAKE 2 TABLETS BY MOUTH EVERY DAY, Disp: 180 tablet, Rfl: 1      Current Outpatient Medications (Cardiac Meds):     amiodarone  200 mg tablet, Take 1 tablet (200 mg total) by mouth daily., Disp: 90 tablet, Rfl: 3    ATORVASTATIN  20 mg tablet, TAKE 1 TABLET BY MOUTH EVERYDAY AT BEDTIME, Disp: 90 tablet, Rfl: 3    metoprolol  tartrate 50 mg tablet, Take 1 tablet (50 mg total) by mouth two (2) times daily., Disp: 180 tablet, Rfl: 3    Current Outpatient Medications (GI Meds):     omeprazole  20 mg DR capsule, Take 1 capsule (20 mg total) by mouth two (2) times daily., Disp: 60 capsule, Rfl: 3    Current Outpatient Medications (Nutrition):     ascorbic acid  500 mg tablet, Take 1 tablet (500 mg total) by mouth daily., Disp: , Rfl:     Multiple Vitamins-Minerals (HCA SUPER THERAVITE-M PO), Take 1 tablet by mouth daily., Disp: , Rfl:     Oyster Shell (CALCIUM  CARBONATE 1250 MG, 500 MG ELEMENTAL,) 1250 mg tablet, Take 1 tablet (1,250 mg total) by mouth daily with breakfast., Disp: 120 tablet, Rfl: 2    vitamin D, cholecalciferol , 25 mcg (1000 units) tablet, Take 1 tablet (25 mcg total) by mouth daily., Disp: , Rfl:       Current Outpatient Medications (Heme Meds):     apixaban  5 mg tablet, Take 1 tablet (5 mg total) by mouth two (2) times daily., Disp: 180 tablet, Rfl: 3    ferrous gluconate  324 mg tablet, Take 1 tablet (324 mg total) by mouth every other day., Disp: 45 tablet, Rfl: 1      Current Outpatient Medications (Antibiotics):     amoxicillin 500 mg capsule, Use as directed prior to dental appointment, Disp: , Rfl:               ALLERGIES:      No Known Allergies    SOCIAL HISTORY:      Social History     Socioeconomic History    Marital status: Married   Tobacco Use    Smoking status: Former     Current packs/day: 0.00     Average packs/day: 1 pack/day for 15.0 years (15.0 ttl pk-yrs)     Types: Cigarettes     Start date: 05/03/1975     Quit date: 05/02/1990     Years since quitting: 33.5    Smokeless tobacco: Never   Substance and Sexual Activity    Alcohol use: Not Currently     Comment: occasionally has red wine    Drug use: Not Currently     Social Drivers of Health     Financial Resource Strain: Medium Risk (10/25/2023)    Financial Resource Strain     Difficulty of Paying Living Expenses: Somewhat hard            Substances   []         Alcohol Intake (quantity, timing)   [x]   Caffeine Intake (quantity, timing) - 1 cup of tea couple times a week   []         Tobacco/Nicotine Exposure (quantity, timing)   []         Recreational Agents (quantity, timing)   []         Dietary Supplements (quantity, timing)  FAMILY HISTORY:      Family History   Problem Relation Age of Onset    No Known Problems Mother     Other (gastric ulcer) Father     Uterine cancer Sister     Stroke Brother     Kidney failure Brother     Colon cancer Neg Hx     Lung cancer Neg Hx     Prostate cancer Neg Hx      Brother- OSA    QUESTIONNAIRES:     EPWORTH SLEEPINESS SCALE (ESS): 24    The ESS presents eight different situations to a subject who must assign a score of 0 to 3 (maximum score 24), corresponding to the likelihood of falling asleep in each situation.  Excessive daytime sleepiness is typically defined as a score >=10. (Adapted from Naomi Elbe A New Method for Measuring Daytime Sleepiness: The Epworth Sleepiness Scale. Sleep 1991; 14(6): 540 - 545).      0 = would never doze   1 = slight chance of dozing   2 = moderate chance of dozing   3 = high chance of dozing     Situation:  ESS Score by Category  Total Score   Sitting and reading   []  0 []  1 []  2 [x]  3    Watching TV   []  0 []  1 []  2 [x]  3    Sitting inactive in a public place   []  0 []  1 []  2 [x]  3    Being a passenger in a car for an hour   []  0 []  1 []  2 [x]  3    Lying down in the afternoon   []  0 []  1 []  2 [x]  3    Sitting and talking to someone   []  0 []  1 []  2 [x]  3    Sitting quietly after lunch (no alcohol)  []  0 []  1 []  2 [x]  3    Stopping for a few minutes in traffic while driving  []  0 []  1 []  2 [x]  3        REVIEW OF SYSTEMS:      All systems below reviewed and negative, except as marked:     System Check if Present Description   Constit  []  Fever []  Chills []  Nightsweats     HEENT  []  Vision changes      Resp  []  Shortness of breath []  Wheezing      CV  []  Chest pain  []  Palpitations     GI  []  Nausea []  Vomiting  []  Constipation   []  Diarrhea  [] Abdominal pain  []  Bloating     MSK  [x]  Muscle/Joint pain      Endo  []  Heat/cold intolerance  []  Polyuria/polydipsia   []  Abnormal hair growth  []  Acne     Skin  []  Rashes     Heme  []  Easy bleeding/bruising     Neuro  []  Headaches  []  Hx of seizure     Psych  []  Depression    Sleep  []  Insomnia [x]  Snoring [x]  Apneic Spells   []  Nocturnal movements []  Dream enactment      [x]  Sleepiness []  Cataplexy []  Sleep Paralysis   []  Restlessness []  Urge to move        []  ROS conducted and all of the above was negative     ROS      SLEEP MEDICINE EXAMINATION     Modified Mallampati (  Friedman) Class Type (*): 3     (*) Modified Mallampati Scoring: 3-4    Class 1: Soft palate, uvula, fauces, pillars visible.  Class 2: Soft palate, uvula, fauces visible.  Class 3: Soft palate, base of uvula visible.  Class 4: Only hard palate visible.        Mallampati Class:     The Mallampati class is assessed with the tongue protruded, or in a modified form with the tongue remaining on the floor of the mouth. Both the Mallampati class and tonsillar enlargement are classified into 4 categories, with increasing class indicating greater loss of airway volume.    (Adapter from [place], Corina M, Simel DL. Does This Patient Have Obstructive Sleep Apnea? The Rational Clinical Examination Systematic Review. JAMA. 2013;310(7):731-741. doi:10.1001/jama.7986.723814).    Tonsillar Size: Grade 1-2        (**) Molar Occlusion Scoring: 2  Class I is considered an ideal relationship for how the maxillary molar teeth occlude with the mandibular molar teeth.   Class II  is often referred to as an overbite. The maxillary molars position is protruded anteriority (referred to as an ''overbite''), when compared with the mandibular  molars when they occlude together.   Class III occlusion is often referred to as an underbite. The mandibular teeth are positioned forward to the maxillary teeth.            LABORATORY STUDIES:      TSH   Date Value Ref Range Status   10/16/2023 2.1 0.3 - 4.7 mcIU/mL Final     Comment:     TSH is normal, no further Thyroid tests were performed.    If applicable TSH pregnancy reference intervals:   First trimester (10-[redacted] weeks gestation): 0.03- 4.0 mcIU/mL  Second trimester (14-[redacted] weeks gestation): 0.19- 4.0 mcIU/mL     09/10/2023 4.1 0.3 - 4.7 mcIU/mL Final     Comment:     TSH is normal, no further Thyroid tests were performed.    If applicable TSH pregnancy reference intervals:   First trimester (10-[redacted] weeks gestation): 0.03- 4.0 mcIU/mL  Second trimester (14-[redacted] weeks gestation): 0.19- 4.0 mcIU/mL          Vitamin D,25-Hydroxy   Date Value Ref Range Status   05/25/2023 39 20 - 50 ng/mL Final     Comment:     Ingestion of high levels of biotin in dietary supplements may lead to falsely increased results.    Deficiency: less than 12 ng/mL                                                   Inadequate: 12-19 ng/mL Adequate: 20-50 ng/mL                                                             Potential adverse effects: greater than 50 ng/mL          Lab Results   Component Value Date    FERRITIN 28 10/19/2023     Lab Visit on 10/21/2023   Component Date Value Ref Range Status    Helicobacter pylori Antigen 10/21/2023 Negative  Negative Final  RELEVANT NEUROLOGIC STUDIES:      Results for orders placed or performed during the hospital encounter of 08/08/23   Echo adult transthoracic complete   Result Value Ref Range    Left Ventricular Ejection Fraction 62.5 %    Narrative     [facility]        [address]           [place], [place] [address]            Phone: [phone]     TRANSTHORACIC ECHOCARDIOGRAM REPORT     Patient Name:        JERIS ROSER         Date of Exam:   08/08/2023  Medical Rec #:       [id number]                Accession #:    [id number]  Date of Birth:       07/17/1955              Height:         66 in  Age:                 58 years               Weight:         202 lbs  Gender:              M                      BSA:            2.01 m  Referring Physician: [id number] [facility] VUCICEVIC Blood Pressure: /  Diagnosis:  Z95.2-S/P AVR (aortic valve replacement)  Indication: Aortic valve replacement.     MEASUREMENTS:     LVIDd (2D) 4.40 cm (index 2.19 cm/m) LVIDs (2D) 2.70 cm (index 1.34 cm/m)  IVSd (2D)  1.40 cm                    LVPWd (2D) 1.30 cm     FINDINGS:  LEFT VENTRICLE: The left ventricular size is normal. Moderate concentric left ventricular hypertrophy. Normal LV regional wall motion. The left ventricular systolic function is normal. Left ventricular ejection fraction is approximately 60 to 65%.   Abnormal LV diastolic function with an impaired physiology (Grade I). The left ventricle stroke volume by Doppler method is 72.0 ml (index 35.9 ml/m). MV deceleration time is 225 msec.  MV E velocity is 0.76 m/s. MV A velocity is 1.18 m/s. E/A ratio is 0.64.  Lateral e' velocity is 5.4 cm/s. Medial e' velocity is 7.7 cm/s.  Lateral E/e' ratio is 13.9. Medial E/e' ratio is 9.8. Averaged E/e' ratio is 11.5.  LEFT ATRIUM: The left atrium is normal in size. The LA volume (Biplane method) is 44.9 ml. The LA Volume index is 22.4 ml/m.  RIGHT ATRIUM: The right atrium is normal in size. RA area is 14.6 cm. RA volume is 33.5 ml. The RA volume index is 16.7 ml/m.  SHUNTS: Color Doppler shows no evidence of an intracardiac shunt.  RIGHT VENTRICLE: The right ventricular size is normal. Global RV systolic function is normal. TAPSE is 17 mm. The RV free wall tissue Doppler S' wave measures 10.7 cm/s. The right ventricle basal diameter measures 39 mm. The right ventricle mid cavity   measures 32 mm. The right ventricle longitudinal diameter  measures 64 mm.  MITRAL VALVE: The posterior leaflet is mildly thickened and/or calcified. The mitral valve appears thickened. Mitral annular calcification noted. Trace mitral valve regurgitation. There is no mitral valve stenosis. The mean gradient across the mitral   valve is 1.0 mmHg.  AORTIC VALVE: S/p bioprosthetic AVR. 25 mm Carpentier bioprosthetic valve is present in the aortic valve position. No evidence of aortic valve regurgitation. The LVOT velocity is 0.82 m/s. The peak aortic valve velocity is 2.20 m/s. The peak   instantaneous pressure gradient is 19.4 mmHg; mean pressure gradient is 11.0 mmHg; LVOT diameter is 2.10 cm. By the continuity equation, the calculated aortic valve area by Vmax method is 1.30 cm (index 0.65 cm/m) and by VTI method is 1.70 cm (index 0.85   cm/m). No aortic valve stenosis.  TRICUSPID VALVE: The tricuspid valve appears normal in structure. There is no tricuspid valve stenosis. Trace tricuspid regurgitation is present.  PULMONIC VALVE: The pulmonic valve is normal. Trace pulmonary valve regurgitation. No evidence of pulmonic valve stenosis.  AORTA: Mildly dilated proximal ascending aorta. The sinus of Valsalva measures 37 mm (index 18 mm/m) The proximal ascending aorta measures 41 mm (index 20 mm/m). No evidence for aortic coarctation.  PULMONARY ARTERY: The peak TR velocity is not well defined, and therefore, pulmonary artery systolic pressure cannot be calculated.  IVC: The IVC measures at 1.40 cm. Normal inferior vena cava in diameter. There is greater than 50% collapse of the IVC during respiration. Normal right atrial pressure.  PERICARDIUM: There is no pericardial effusion.       Impression     1. Normal left ventricular size, moderate concentric left ventricular hypertrophy and normal LV systolic function.   2. LV ejection fraction is approximately 60 to 65%.   3. Abnormal LV diastolic function (Grade I).   4. A 25 mm Carpentier bioprosthetic valve is present in the aortic position. Echo findings are consistent with normal structure and function of the aortic prosthesis with mean gradient of 11 mmHg.   5. The peak TR velocity is not well defined, and therefore, pulmonary artery systolic pressure cannot be calculated.   6. Mildly dilated proximal ascending aorta (41 mm).   7. Prior examinations are available and were reviewed for comparison purposes. Compared to prior study on 09/07/22, there are no significant changes.  [id number] Franco Bring MD  Electronically signed by [id number] Franco Bring MD on 08/13/2023 at 10:14:50 AM     Sonographer: Camellia Loges      Final         SLEEP STUDIES:      Pending    ASSESSMENT:      Kyston Gonce is a 68 y.o. male with a  has a past medical history of Antral ulcer (09/2023), CVA (cerebral vascular accident) (HCC/RAF), H/O aortic valve replacement, Kidney stone, and Urinary frequency. . The patient is presenting with     - Sleep-disordered breathing, likely obstructive sleep apnea, as indicated by low oxygen levels during sleep during recent admission, witnessed apneic events, loud snoring, choking spells, and daytime sleepiness.  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    SYMPTOM DOCUMENTATION IN SUPPORT OF SLEEP APNEA DIAGNOSIS:    Evidence of Excessive Daytime Sleepiness  [x]  Disturbed or restless sleep  [x]  Non-restorative sleep  [x]  Frequent unexplained arousals from sleep  []  Fragmented sleep  [x]  Epworth Sleepiness Scale (ESS) greater than  or equal to 10  [x]  Fatigue    Evidence Suggestive of Sleep Disordered Breathing  [x]  Habitual loud snoring  [x]  Witnessed apneas during sleep  [x]  Choking or gasping during sleep  [x]  BMI greater than or equal to 30  []  Neck circumference greater than 17 inches (men) or greater than16 inches (women)  []  Sleep related bruxism  []  Cognitive deficits such as inattention or memory  []  Unexplained nocturnal reflux  []  Erectile dysfunction  []  Apneas or hypoxemia during procedures requiring anesthesia  [x]  Morning headaches       RECOMMENDATIONS:      - Conduct an in-lab polysomnography to evaluate for sleep-disordered breathing and periodic leg movements. The study will include a split night with EMG montage to assess for sleep apnea and other potential sleep disturbances.  - If obstructive sleep apnea is diagnosed, initiate management with CPAP therapy to maintain airway patency and reduce the risk of future cardiovascular events.  - Encourage daily outdoor light exposure for at least one hour, preferably in the morning, to help regulate the circadian rhythm and improve wakefulness.  - Advise limiting naps to 15-20 minutes between 12:00 to 2:00 PM to enhance their effectiveness and avoid disrupting nighttime sleep.  - Recommend avoiding exposure to electronic screens and bright lights after 9 PM to prevent interference with sleep quality.  - If sleep apnea is confirmed, consider discussing pharmacotherapy options for weight reduction with the primary care physician, particularly given the patient's BMI of 30 and the potential benefits for managing sleep apnea.  - Plan a follow-up communication one to two weeks after the polysomnography to discuss the results and further recommendations.    --------------------------------------    Suspect sleep apnea: I discussed with patient the pathophysiology of obstructive sleep apnea (OSA), as well as the health risks associated with untreated obstructive sleep apnea including hypertension, refractory hypertension, coronary artery disease, heart failure, cardiac dysrhythmia, stroke, insulin resistance as well as the potential to exacerbate underlying mood disorders as illustrated in the diagram below:           I explained that OSA is a common sleep disorder which is characterized by temporarily cessation of breathing during sleeping, leading to decreases in oxygen levels and sympathetic activation. Pauses in breathing also lead to sleep fragmentation and lead to nonresporative sleep, and hypersomnolence.     I provided Shadee Montoya details specific to the risks of untreated obstructive sleep apnea leading to increased cardiovascular disease, including hypertension and type 2 diabetes mellitus, hypertension, coronary heart disease, cardiac arrhythmia, and heart failure. The pathophysiologic mechanisms and outcomes of OSA is summarized below:     (A) OSA is characterized by recurrent episodes of complete (apnea, represented in this figure) or partial (hypoapnea) obstruction of the upper airway during sleep.                 (B) Apnea episodes (between red broken lines in the figure below) result in cessation of the airflow, often accompanied by reduced oxygen (O2) saturation and increased systemic blood and arterial pulmonary pressure. As a result, sympathetic neural activity (SNA) increases and apnea episodes end with an arousal of the central nervous system to restore upper airway patency, marked by an increased electroencephalogram (EEG) wave frequency. Thus, repetitive obstruction episodes while sleeping culminate in cyclical deoxygenation-reoxygenation (intermittent hypoxia; IH), overactivation of SNA, bursts in systemic blood and arterial pulmonary pressures, and several arousals and microarousals that result in sleep fragmentation (SF).       (  C) As a result, OSA is associated with oxidative stress, inflammation, endothelial dysfunction, and changes in circulating factors.     (D) Untreated OSA has been associated with an increased predisposition for several impairments and diseases, including hypertension, cardiovascular diseases, metabolic disorders, strObstructive Sleep Apnea and Hallmarks of Aging         (From [place],et al Trends in Molecular Medicine DOI: 10.1016/j.molmed.2017.06.006)      Although most studies have demonstrated these associations independent of the confounding influence of obesity, data points towards causal pathway towards multifactorial etiologic risks, which also illustrate that successful treatment of OSA with continuous positive airway pressure (CPAP) can improve cardiovascular outcomes.I reviewed treatment options for obstructive sleep apnea including: weight loss, positional therapy (elevate head of bed, maintain sleep in lateral positions), surgery, oral appliance therapy, where the gold standard treatment is positive airway pressure (PAP) therapy.    If sleep study demonstrates obstructive sleep apnea, the patient will undergo a trial of PAP therapy and will participate in PAP initiation and management with a follow up within 30-to 90 days to evaluate adherence to therapy. I discussed with patient the logistics and benefits behind nightly PAP therapy    Sleep hygiene is recommended as an initial intervention for all adults with insomnia so that personal habits and environmental factors that negatively impact sleep can be identified and corrected. Patients should be advised to exercise regularly (not within four hours of bedtime); avoid large meals and limit fluid intake in the evenings; limit caffeine, tobacco, and alcohol intake four to six hours before bedtime; use the bedroom for sleep and sex only; maintain a regular sleep-wake cycle without daytime napping; and avoid negative stimuli at bedtime, such as loud noises, bright lights from electronics after 9PM, and extreme temperature variations, but keep the temperature slightly in the cooler side (68?F) and advised that patient pulls over to the side of the road whenever  feeling sleepy while driving. Until the patient is treated,  sleep in the supine position should be avoided as well as a sedatives and alcohol, which may worsen sleep apnea.    ------------------------------------------------------    I would recommend that you also work with your primary care physician to initiate treatment with Zepbound,    Zepbound is now approved for Obstructive Sleep Apnea  in the setting of obesity.  The FDA has approved the first prescription drug, Zepbound, for treating moderate to severe obstructive sleep apnea (OSA) in adults with obesity. Studies have shown that when combined with a low-calorie diet and increased exercise, Zepbound improves OSA symptoms by reducing body weight, particularly in those with weight-related health issues.     OSA is a breathing disorder where the upper airway becomes partially or fully blocked during sleep, causing shallow breathing or brief pauses in breathing (apnea), often followed by gasping, snorting, or waking suddenly. OSA can reduce oxygen flow, disrupt heart rhythms, and harm overall health. Common signs such as snoring, tiredness, daytime sleepiness, and disrupted sleep often go unnoticed. OSA is more common in people with obesity, as excess neck weight can press down on the airway.      Zepbound, the brand name for the generic drug tirzepatide, is an injectable medication that aims to treat OSA by causing weight loss. It works by activating special proteins -- glucagon-like peptide 1 (GLP-1) and glucose-dependent insulinotropic polypeptide (GIP) receptors -- in the intestine, which help suppress appetite and reduce eating. This dual-receptor activation may offer more effective weight management than the drugs  targeting only the GLP-1 receptor.        Discussed with Sleep Medicine Attending, Dr. Alon Avidan.         Delon Camp, MD   [place] [facility] Medicine Fellow      ORDERS PLACED FOR THIS ENCOUNTER       Orders Placed This Encounter    Sleep Study, Adult     With EMG montage     Standing Status:   Future     Expiration Date:   10/28/2024     Type of Sleep Study     (If unsure which Sleep Study is appropriate to order, consider ordering a Referral to Sleep Consultation)*:   Split Night Polysomnography (Test and Treat with PAP if baseline AHI>=15 after 2-3 hours) - CPT 95810 or 04188     Type of Sleep Study     (If unsure which Sleep Study is appropriate to order, consider ordering a Referral to Sleep Consultation)*:   Add EMG Montage     PAP Device Type::   CPAP     PAP Device Settings:   Per Sleep Lab Protocol     Sleep Lab Location:   [facility]     Indicate patient?s symptoms or reason for testing (MUST be included in Progress Note):   Excessive daytime sleepiness     Indicate patient?s symptoms or reason for testing (MUST be included in Progress Note):   Snoring     Indicate patient?s symptoms or reason for testing (MUST be included in Progress Note):   Apneas     Indicate patient?s symptoms or reason for testing (MUST be included in Progress Note):   Abnormal movement during sleep     Does the patient have a caregiver to assist with activities of daily living (ADLs)? If so, they MUST accompany patient overnight.:   Yes     If caregiver required at home, caregiver must be present during test. Identify caregiver::   Wife     Does patient have special scheduling considerations/needs?:   No     Orders Placed This Encounter Sleep Study, Adult         FOLLOW UP VISIT:       Patient should return to see me per below.    SLEEP MEDICINE CLINIC INSTRUCTIONS FOR FOLLOW UP CARE:     We asked that the patient return sooner if sleep disturbances persist.     Patient should contact us  for further input in the interim via an [employer] or by calling [phone] and requesting to speak with the Sleep Medicine Coordinator.       Delon Camp, MD   [place] [facility] Fellow      Discussed with Dr. Stanford Foster, Sleep Medicine Attending    Attending Physician Attestation Statement:    The patient was evaluated via telemedicine with the sleep fellow. I was present for the key portions of the history and abbreviated upper airway physical examination conduced via telemedicine. We formulated the assessment and plan together. I discussed the history, clinical findings and upper airway exam with the fellow and agree with the fellow's findings and plan as documented in the fellow's note.I agree with the consult note above, which reflects the details of our evaluation.    Total time on the date of the encounter was 80 minutes reviewing obtained history, counseling and educating the patient and documenting clinical information in the electronic health record.   Greater than 50% of the visit time was  spent in the review of the clinical findings, review of sleep questionnaire data, sleep log and sleep wake patterns as well as a comprehensive inventory sleep complaints. Time included discussing the differential diagnosis, diagnosis, prognosis, and treatment options. Patient was counseled about the implication of the sleep disorders, the consequences of poor and inappropriate sleep as well as the risk for insufficient seep. Counseling was provided specific to enhancing sleep duration strategies. Care coordination was reviewed outlining follow up plan.    Sincerely,    STANFORD PATRIC FOSTER, M.D., M.P.H. DIEDRA MASSE  Professor of Neurology  [facility] Department of Neurology  [employer] at [facility] (Neurology)  [address] [facility] [address] [place], [place] [address]  Phone [phone]  [phone]       SLEEP MEDICINE CLINIC COORDINATION PLAN:     SLEEP CLINIC COORDINATION    []  Follow-up in this Sleep Clinic with NO PRE-VISIT follow-up studies required. Check-out/Call Center, please schedule a routine follow-up appt.     [x]  Follow-up requested in this Sleep Clinic - AFTER checked PRE-VISIT items below are done.    Check-out/Call Center, do NOT make routine follow-up appointment.  Please create/send Telephone Encounter to Sleep Coordinator.    []  No Sleep Clinic follow-up needed.  Recommendations will be sent to your regular doctor for follow-up and management.  If you wish a follow-up appointment with Sleep Clinic, please ask your regular doctor to request the follow-up or contact the Neurology Sleep Clinic.    Required Pre-Visit Items for Sleep Clinic Return Visits:    [x]   Sleep Study.  If checked, please call the Sleep Lab (not clinic) at [phone] to schedule the Sleep Study at [facility] (an authorization may be required).  Once scheduled, notify us  as below.    []  Cognitive Behavioral Therapy for Insomnia (CBTi).  If checked, an initial CBT visit is being requested before your return visit to the Sleep Clinic.  As soon as this initial CBT visit has been scheduled, please notify us  as below.  If appropriate, also ask your CBT therapist to send a note or initial report to your Sleep Physician.     []  CPAP Usage Report (also known as DME Compliance Report) to be obtained 1 week before next visit.  If checked, this means your doctor should have a report from your CPAP machine delivered to clinic 1 week before your next visit. How this is done varies by the CPAP machine model and may require contacting the vendor.  Please check with your doctor or coordinator as below if you have questions.     []  Dental Consultation    []  Other required Pre-Visit Items:       For our patients, Instructions for Sleep Clinic Return Visits:    Contact us  at [email or web address] or [phone] as follows:    Contact us  in 7-10 business days if a sleep study is checked above AND your insurance requires pre-authorization AND you have not heard from us  on authorization approval.  We will give you an update on authorization process.  You may also contact your carrier.  Contact us  as soon as any or all of the checked items above are scheduled so we can then schedule your Sleep Return Visit to occur AFTER the items are complete and results received.    Contact us  if you reschedule ANY of above items because then we may need to reschedule  any previously existing sleep appointment.   In general, we will check within 7-10 business days to see if we can make a return appointment or not and notify you of making that appointment.  However, if we are not able to make a return appointment because required items have not yet been scheduled after that time period, you will need to contact us  to let us  know when items have been scheduled so we can make your follow-up appointment.   Contact us  for any questions.  Coordination of these items and return visits can be confusing and we are here to help.      IMPORTANT NOTE:  If checked items above have not been completed or results have not been received by your sleep doctor BEFORE your next scheduled appointment, we may contact you to reschedule existing appointments until checked items are completed.  Please contact us  by [email or web address]) or [phone] for any questions.

## 2023-10-30 NOTE — Progress Notes
 I have seen and evaluated patient with resident physician. I agree with the above notation, including the impression and plan of care which we formulated together.

## 2023-10-31 DIAGNOSIS — S82391A Other fracture of lower end of right tibia, initial encounter for closed fracture: Secondary | ICD-10-CM

## 2023-11-07 ENCOUNTER — Other Ambulatory Visit: Payer: PRIVATE HEALTH INSURANCE

## 2023-11-24 NOTE — Progress Notes
 I have interviewed and examined the patient with Dr. Fernand on the date of service. All labs, studies, and medications were independently reviewed by me. I have reviewed the note above and agree with the history, physical, assessment and plan which we formulated together. The patient and/or their caregiver was informed of the above plan, demonstrated understanding and was agreeable to the plan as written above. I have the following additions to the note:    Has ECHO scheduled and ards appointment scheduled.  Can consider trial of PPI if cardiac tests unrevealing  XRAY today with pleural effusions, cards follow up coming up    Yaslene Lindamood A. Bluford, MD

## 2023-11-28 ENCOUNTER — Other Ambulatory Visit: Payer: PRIVATE HEALTH INSURANCE

## 2023-11-29 ENCOUNTER — Other Ambulatory Visit: Payer: Commercial Managed Care - HMO

## 2023-12-03 ENCOUNTER — Other Ambulatory Visit: Payer: PRIVATE HEALTH INSURANCE

## 2023-12-04 ENCOUNTER — Other Ambulatory Visit: Payer: PRIVATE HEALTH INSURANCE

## 2023-12-05 ENCOUNTER — Other Ambulatory Visit: Payer: Commercial Managed Care - Pharmacy Benefit Manager

## 2023-12-09 ENCOUNTER — Other Ambulatory Visit: Payer: Commercial Managed Care - HMO

## 2023-12-11 ENCOUNTER — Telehealth: Payer: Commercial Managed Care - Pharmacy Benefit Manager

## 2023-12-11 ENCOUNTER — Other Ambulatory Visit: Payer: PRIVATE HEALTH INSURANCE

## 2023-12-11 NOTE — Telephone Encounter
 Confirmation Documentation   Patient is scheduled on (DATE) for:  9/16  9   []  Colonoscopy   [x]  Upper Endoscopy   []  Pouchoscopy   []  Upper Endoscopy w/Bravo   []  Upper Endoscopy w/ Esophageal Manometry   []  Upper Endoscopy w/ Esophageal Manometry & pH   []  Upper Endoscopy w/Endoflip   []  Sigmoidoscopy   []  Anoscopy   []  Illeoscopy   []  Small Bowel Enteroscopy   []  Esophageal Manometry   []  Anorectal Manometry   []  pH Study   []  Capsule Endoscopy    Informed patient of the following:   []  Arrival time   []  Location including suite number   []  Transportation   []  Mac Script   []  Does patient have prep instructions?   []  Informed patient to call back should there be any questions?   [x]  Does procedure order match the case scheduled    NOTE: If patients have any questions regarding prescribed medication patient needs to contact their prescribing physician to ensure it is ok to stop medications.

## 2023-12-12 ENCOUNTER — Ambulatory Visit: Payer: PRIVATE HEALTH INSURANCE | Attending: Sports Medicine

## 2023-12-12 DIAGNOSIS — S8251XA Displaced fracture of medial malleolus of right tibia, initial encounter for closed fracture: Principal | ICD-10-CM

## 2023-12-12 NOTE — Nursing Note
 Patient was called and educated on the followings:    Door to door ride person requirement at discharge (confirmation with the ride person needed during the pre-op)  2.   Wear comfortable clothes/low heel shoes  3.   Bring necessary health aids and cases for storage if applicable  4.   Questions regarding bowel prep/procedure instruction answered as needed.  5.   Review recent doses of blood thinners, PPIs/H2-Antagonist, diabetic medication, and/or GLP-1 agonists if applicable  6.   Review recent acute illness/hospitalization in the last couple months             Patient request special assistance with changing to the gown since he is post-stroke.

## 2023-12-12 NOTE — Patient Instructions
-   Referral placed for consult with a Foot & Ankle Surgeon or Trauma Surgeon regarding your ankle non-union fractures. Please call [phone] to schedule with Dr. Denna at [facility] or Dr. Karsten at [facility]

## 2023-12-12 NOTE — Progress Notes
 [place] Department of Orthopaedic Surgery  Division of Sports Medicine    Sports Medicine Follow-Up Visit        Date: 12/12/2023    Primary Care Physician:  Jeana Francisco, MD    Chief Complaint: right ankle pain    Interval Events:  Andrew [name] is a 68 y.o. male with history of CVA, paroxysmal afib, hypertension, prediabetes presenting for follow-up of right ankle pain x 4 months (DOI 08/15/23). He was last seen in Sports Medicine Clinic by me on 10/28/23 for initial evaluation. At that time, he was diagnosed with minimally displaced fractures of the medial malleolus and lateral aspect of distal tibia with intact mortise. He elected to proceed with a trial of non-operative management with CAM walking boot. He is primarily wheelchair bound but has been doing some light weightbearing without pain. He reports mild medial ankle pain, associated with stiffness. No new injury.    Previous treatments include: RICE, NSAIDs, walking boot, ankle brace     The patient has no additional complaints at this time and otherwise feels well.    Medications:  Outpatient Medications Prior to Visit   Medication Sig Dispense Refill    ALLOPURINOL  100 mg tablet TAKE 2 TABLETS BY MOUTH EVERY DAY 180 tablet 1    amiodarone  200 mg tablet Take 1 tablet (200 mg total) by mouth daily. 90 tablet 3    amoxicillin 500 mg capsule Use as directed prior to dental appointment      apixaban  5 mg tablet Take 1 tablet (5 mg total) by mouth two (2) times daily. 180 tablet 3    ascorbic acid  500 mg tablet Take 1 tablet (500 mg total) by mouth daily.      ATORVASTATIN  20 mg tablet TAKE 1 TABLET BY MOUTH EVERYDAY AT BEDTIME 90 tablet 3    ferrous gluconate  324 mg tablet Take 1 tablet (324 mg total) by mouth every other day. 45 tablet 1    metoprolol  tartrate 50 mg tablet Take 1 tablet (50 mg total) by mouth two (2) times daily. 180 tablet 3    Multiple Vitamins-Minerals (HCA SUPER THERAVITE-M PO) Take 1 tablet by mouth daily.      omeprazole  20 mg DR capsule Take 1 capsule (20 mg total) by mouth two (2) times daily. 60 capsule 3    Oyster Shell (CALCIUM  CARBONATE 1250 MG, 500 MG ELEMENTAL,) 1250 mg tablet Take 1 tablet (1,250 mg total) by mouth daily with breakfast. 120 tablet 2    vitamin D, cholecalciferol , 25 mcg (1000 units) tablet Take 1 tablet (25 mcg total) by mouth daily.       No facility-administered medications prior to visit.       PHYSICAL EXAM:     Vitals: There were no vitals taken for this visit.  General: NAD, pleasant & cooperative  Head: EOMI, no facial lesions    Right Ankle:  Inspection: Edema: mild lateral ankle, Ecchymoses: negative, Deformity: negative, Warmth: negative  ROM: Active and passive ROM limited by stiffness  Palpation: TTP over medial malleolus (mild).  No TTP over 5th metatarsal, lateral malleolus, navicular, or talus.  No TTP over the peroneal, posterior tibial, anterior tibial, and Achilles' tendons.  Strength: 5/5 in dorsiflexion, plantarflexion, eversion, and inversion  Neurovascular: normal distal pulses and sensation.    Skin: No ulcers, erythema, or skin breakdown  Psych: Alert, appropriate affect    IMAGING:     I have personally reviewed and interpreted the following results:     XR  ankle AP + lat + obl standing right 3V (12/12/23):  My read: Again noted minimally-displaced fracture of the medial malleolus and lateral aspect of distal tibia with stable alignment but no interval healing changes, concerning for nonunion.     XR ankle AP + lat + obl standing right 3V (10/28/23):  My read: Stable alignment of minimally- displaced medial malleolus fracture and lateral aspect of distal tibia with evidence of early interval healing. Ankle mortise is intact. No erosion or calcification. No significant degenerative changes.    XR right ankle (08/19/23):  My read: Minimally displaced fractures of the medial malleolus and lateral aspect of distal tibia with intact mortise.     Formal read:  ''Minimally displaced intra-articular fracture of the medial malleolus shows unchanged alignment.   Mildly displaced intra-articular fracture of the lateral aspect of the distal tibia (Tillaux-Chaput tubercle fracture) also shows unchanged alignment.  There is no change in alignment on the gravity stress view.  Persistent diffuse soft tissue swelling, which is most pronounced at the medial aspect of the ankle and distal lower leg.  Joint spaces and ankle mortise are maintained.  Plantar retrocalcaneal enthesophytes.''    IMPRESSION:     Andrew [name] is a 68 y.o. male who presents for follow-up of right ankle pain x 4 months. 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 I reviewed his repeat XR today. Discussed non-operative versus operative management, and risk of nonunion, instability, post-traumatic osteoarthritis. Upon discussion of management options, I recommend proceeding with Foot & Ankle Surgery consult to consider ORIF. He is agreeable with this plan. RICE and activity restrictions reviewed in the meantime. He is agreeable with this plan.      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    The above plan of care, diagnosis, orders, and follow-up were discussed with the patient.  Questions related to this recommended plan of care were answered.    Alan Flint Corporal, MD  [place] Division of Sports Medicine  Departments of Family Medicine and Orthopedics  12/12/2023

## 2023-12-13 ENCOUNTER — Ambulatory Visit: Payer: PRIVATE HEALTH INSURANCE

## 2023-12-13 ENCOUNTER — Other Ambulatory Visit: Payer: Commercial Managed Care - Pharmacy Benefit Manager

## 2023-12-13 DIAGNOSIS — R0781 Pleurodynia: Principal | ICD-10-CM

## 2023-12-13 NOTE — Progress Notes
 [facility]  [address]  [address]  [place] [address]  Phone: [phone]  FAX: [phone]    URGENT CARE VISIT - ADULT    Subjective:   CC  Rib Injury (Pt states slid from wheelchair while transferring out of vehicle/No LOC, denies hitting head/When trying to pull himself back up, feels he strained something on R side of his body /8/10 sharp pain)  HPI  Bazil Dhanani is a 68 y.o. male seen in [facility] clinic 12/13/2023 Friday PM accompanied by wife  H/o aortic valve replacement, L sided CVA (R side of brain), on eliquis ; obese, h/o rib fracture 2016  Was trying to transfer from car to wheelchair on Monday evening.  Slipped and fell towards ground at edge of car.  Using right arm, grabbed handle and pulled self up thrice before made it onto wheelchair.    Since then has R sided torso pain.  Esp with overhead arm activities.  Some relief with tylenol .  Pain worsened over time. Tuesday AM more than Monday.  No dyspnea, palpitations, chest pain. No pleuritic pain. No nausea, vomiting, diarrhea, constipation, melena, hematochezia.  Spouse concerned about possible rib fracture.      Medications/Supplements  Medications that the patient states to be currently taking   Medication Sig    ALLOPURINOL  100 mg tablet TAKE 2 TABLETS BY MOUTH EVERY DAY    amiodarone  200 mg tablet Take 1 tablet (200 mg total) by mouth daily.    apixaban  5 mg tablet Take 1 tablet (5 mg total) by mouth two (2) times daily.    ascorbic acid  500 mg tablet Take 1 tablet (500 mg total) by mouth daily.    ATORVASTATIN  20 mg tablet TAKE 1 TABLET BY MOUTH EVERYDAY AT BEDTIME    metoprolol  tartrate 50 mg tablet Take 1 tablet (50 mg total) by mouth two (2) times daily.    Oyster Shell (CALCIUM  CARBONATE 1250 MG, 500 MG ELEMENTAL,) 1250 mg tablet Take 1 tablet (1,250 mg total) by mouth daily with breakfast.    vitamin D, cholecalciferol , 25 mcg (1000 units) tablet Take 1 tablet (25 mcg total) by mouth daily.       Objective:   Physical Exam  BP 143/94  ~ Temp 36.8 ?C (98.2 ?F) (Tympanic)  ~ Resp 19  ~ SpO2 96%   NAD. Obese. AFO brace left ankle.   Lungs clear bilat. Good air entry symmetrically bilaterally.  No rales, rhonchi, wheezing.  No apparent pain with inspiration or expiration.  No HSM. Belly soft NT/ND. Obese.  Neg murphy's.   No TTP with compression of thoracic cage Lat-Lat or Ant-posterior.    Pain reproducible with PROM right shoulder to maximal elevation at approximately T8 in anterior R axillary line.  Tenderness over musculature. Hurts to perform empty can lift.     Assessment/Plan:       ICD-10-CM    1. Rib pain on right side  R07.81 XR rib series right w pa chest        Pain likely muscular in origin.  Low suspicion for fracture, hemothorax, or pneumothorax.    Rec OTC topicals prn. Tylenol  is fine too.   Rec incentive spirometry qid. Return if any symptoms of PNA.  XR placed for rib series if they opt to follow through.    New or Modified Medications for this Encounter   New    No medications on file   Modified    No medications  on file   Discontinued Medications    No medications on file      Earl Zellmer B. Nikkol Pai, MD  12/13/2023 at 3:19 PM

## 2023-12-16 NOTE — H&P
 UPDATED H&P REQUIREMENT    WHAT IS THE STATUS OF THE [name] MOST CURRENT HISTORY AND PHYSICAL?   - The most current H&P was performed within the past 24 hours, and having examined the patient, I attest that there have been no changes to the current H&P. No additional updated H&P documentation is necessary.     REFER TO MEDICAL STAFF POLICIES REGARDING PRE-PROCEDURE HISTORY AND PHYSICAL EXAMINATION AND UPDATED H&P REQUIREMENTS BELOW:    [facility] Medical Staff Policy 200 - For Patients Undergoing Procedures Requiring Moderate or Deep Sedation, General Anesthesia or Regional Anesthesia    Contents of a History and Physical Examination (H&P):    The H&P shall consist of chief complaint, history of present illness, allergies and medications, relevant social and family history, past medical history, review of systems and physical examination, and assessment and plan appropriate to the patient's age.    For Patients Undergoing Procedures Requiring Moderate or Deep Sedation, General Anesthesia or Regional Anesthesia:    1. An H&P shall be performed within 24 hours prior to the procedure by a qualified member of the medical staff or designee with appropriate privileges, except as noted in item 2 below.    2. If a complete history and physical was performed within thirty (30) calendar days prior to the patient?s admission to the Medical Center for elective surgery, a member of the medical staff assumes the responsibility for the accuracy of the clinical information and will need to document in the medical record within twenty-four (24) hours of admission and prior to surgery or major invasive procedure, that they either attest that the history and physical has been reviewed and accepted, or document an update of the original history and physical relevant to the patient's current clinical status.    3. Providing an H&P for patients undergoing surgery under local anesthesia is at the discretion of the Attending Physician.     4. When a procedure is performed by a dentist, podiatrist or other practitioner who is not privileged to perform an H&P, the anesthesiologist?s assessment immediately prior to the procedure will constitute the 24 hour re-assessment.The dentist, podiatrist or other practitioner who is not privileged to perform an H&P will document the history and physical relevant to the procedure.    5. If the H&P and the written informed consent for the surgery or procedure are not recorded in the patient's medical record prior to surgery, the operation shall not be performed unless the attending physician states in writing that such a delay could lead to an adverse event or irreversible damage to the patient.    6. The above requirements shall not preclude the rendering of emergency medical or surgical care to a patient in dire circumstances.

## 2023-12-16 NOTE — Discharge Instructions
 PROCEDURE:  Upper endoscopy (EGD)    FINDINGS: See report.    You may experience a ''bloated'' feeling due to the air that was introduced into your body during the procedure.  You may feel more comfortable once you pass the air.  You may try drinking warm liquids or walking to help you pass the air.    If you had an upper endoscopy, a mild sore throat is not uncommon after the procedure.  You may try gargling or drinking warm liquids for relief. Over the counter sore throat lozenges may also be helpful, follow the package instructions.     ACTIVITY:    [x]  Avoid any strenuous physical activity for 24 hours    [x]  No driving or operating heavy machinery for 24 hours    [x]  No alcoholic beverages for 24 hours (may interact with some medications you received during your procedure)    DIET:    [x]  Resume your usual diet    []  Specific diet instructions:     Call [phone] and ask for Dr. Allena Katz if you experience any of the following:      [x]  Severe chest pain, difficulty breathing    [x]  Passage of blood clots, black tarry stools, vomiting blood    [x]  Severe inflammation at the I.V. site    [x]  Difficulty swallowing or persistent vomiting    [x]  Abdominal pain    [x]  Chills or fever greater than 101 F or 38.4 C within 24 hours of procedure    FOLLOW-UP CARE:   You will be notified of your pathology results by myChart or phone call.  If you are signed up for [facility], your pathology results will be released to you via myChart.  Please make sure that you have enabled the ability to view hospital results.  If you do not receive your results within 14 days, please call the GI office at [phone] and leave a message for Dr. Allena Katz.

## 2023-12-16 NOTE — H&P
 GASTROENTEROLOGY PRE-PROCEDURE HISTORY AND PHYSICAL    Indication for Procedure:     Procedure: []  EGD   []  Colonoscopy  []  Enteroscopy  []  Flexible Sigmoidoscopy                         []  ERCP  []  EUS  []  Other:    Level of sedation intended for procedure:  []  Moderate  []  Deep  []  MAC  []  Anesthesia   Informed Consent Obtained Including Risks, Benefits & Alternatives:  [x]  Yes    Informed Consent Obtained For Sedation Including Risks, Benefits & Alternatives: [x]  Yes  BP 156/105 (BP Location: Left arm, Cuff Size: Regular)  ~ Pulse 69  ~ Temp 36.2 ?C (97.1 ?F)  ~ Resp 18  ~ Ht 5' 6'' (1.676 m)  ~ Wt 190 lb (86.2 kg)  ~ SpO2 98%  ~ BMI 30.67 kg/m?      History of Present Illness  Andrew [name] is a 68 y.o. male with history of gastric peptic ulcer disease, here for second look endoscopy to ensure healing following course of high-dose PPI therapy.     GI Surgeries - None  PPI -  Omeprazole  20mg  BID  Blood thinners - Apixaban   Plt -   Lab Results   Component Value Date    PLT 240 10/19/2023     INR -   Lab Results   Component Value Date    INR 0.9 09/26/2023         Past Medical History  Past Medical History:   Diagnosis Date    Antral ulcer 09/2023    Atrial fibrillation (HCC/RAF)     CVA (cerebral vascular accident) (HCC/RAF) 2013    H/O aortic valve replacement     History of blood transfusion     Hyperlipidemia     Hypertension     Kidney stone     Left-sided weakness     Pacemaker     Urinary frequency        Past Surgical History  Past Surgical History:   Procedure Laterality Date    AORTIC VALVE REPLACEMENT      CARDIAC PACEMAKER PLACEMENT  09/2023    ESOPHAGOGASTRODUODENOSCOPY  09/2023       Medications  Current Outpatient Medications   Medication Instructions    allopurinol  (ZYLOPRIM ) 200 mg, Oral, Daily    amiodarone  200 mg, Oral, Daily    amoxicillin 500 mg capsule Use as directed prior to dental appointment    apixaban  (ELIQUIS ) 5 mg, Oral, 2 times daily    ascorbic acid  500 mg, Daily    atorvastatin  (LIPITOR) 20 mg, Oral, Nightly    calcium  carbonate 1250 mg (500 mg elemental) 1,250 mg, Oral, Daily with breakfast    ferrous gluconate  324 mg, Oral, Every other day    metoprolol  tartrate (LOPRESSOR ) 50 mg, Oral, 2 times daily    Multiple Vitamins-Minerals (HCA SUPER THERAVITE-M PO) 1 tablet, Daily    omeprazole  (PRILOSEC) 20 mg, Oral, 2 times daily    vitamin D (cholecalciferol ) 25 mcg, Daily       Allergies  Patient has no known allergies.    Family History  Family History   Problem Relation Age of Onset    No Known Problems Mother     Other (gastric ulcer) Father     Uterine cancer Sister     Stroke Brother     Kidney failure Brother     Colon cancer Neg  Hx     Lung cancer Neg Hx     Prostate cancer Neg Hx        Social History  Social History     Tobacco Use    Smoking status: Former     Current packs/day: 0.00     Average packs/day: 1 pack/day for 15.0 years (15.0 ttl pk-yrs)     Types: Cigarettes     Start date: 05/03/1975     Quit date: 05/02/1990     Years since quitting: 33.6    Smokeless tobacco: Never   Substance Use Topics    Alcohol use: Not Currently     Comment: occasionally has red wine    Drug use: Not Currently       Review of Systems  A complete ROS was performed, pertinent positives and negative are in the HPI, remainder of 14 systems is negative    Physical Examination  Vitals Signs:   Last Recorded Vital Signs:    12/17/23 0956   BP:    Pulse:    Resp:    Temp: 36.2 ?C (97.1 ?F)   SpO2:        Normal Exam                                     Additional Findings  [x]  General  [x]  HEENT  [x]  Heart / CVS  [x]  Lungs / Respiratory  [x]  Abdomen    Airway Assessment:    [place] Visualized: [x]  Yes  []  Partially  []  Not at all  Neck ROM: [x]  Normal  []  Limited  Sleep apnea confirmed by sleep study or on CPAP at home: [x]  No  []  Yes    Laboratory Data  Lab Results   Component Value Date    WBC 4.42 10/19/2023    HGB 10.1 (L) 10/19/2023    HCT 32.1 (L) 10/19/2023    MCV 83.4 10/19/2023    PLT 240 10/19/2023 Lab Results   Component Value Date    CREAT 0.88 10/19/2023    BUN 14 10/19/2023    NA 143 10/19/2023    K 4.1 10/19/2023    CL 106 10/19/2023    CO2 24 10/19/2023       Lab Results   Component Value Date    ALT 27 09/23/2023    AST 19 09/23/2023    ALKPHOS 47 09/23/2023    BILITOT 0.4 09/23/2023        Impression:  1. PUD    Recommendations  1.  Proceed with procedure.      ASA Classification: ASA 3 - Patient with moderate systemic disease with functional limitations    I have reviewed the history and physical and have determined Andrew [name] to be an appropriate candidate to undergo the planned procedure with sedation and analgesia.    Andrew [name]   12/17/2023 10:23 AM

## 2023-12-17 MED ADMIN — SODIUM CHLORIDE 0.9 % IV SOLN: INTRAVENOUS | @ 18:00:00 | Stop: 2023-12-17 | NDC 00338004904

## 2023-12-17 MED ADMIN — LIDOCAINE HCL (CARDIAC) 100 MG/5ML IV SOSY: INTRAVENOUS | @ 18:00:00 | Stop: 2023-12-17 | NDC 76329339001

## 2023-12-17 MED ADMIN — PROPOFOL 200 MG/20ML IV EMUL: INTRAVENOUS | @ 18:00:00 | Stop: 2023-12-17 | NDC 63323026929

## 2023-12-17 MED ADMIN — PROPOFOL 200 MG/20ML IV EMUL (ANES): INTRAVENOUS | @ 18:00:00 | Stop: 2023-12-17

## 2023-12-17 NOTE — Procedures
 UPPER GI ENDOSCOPY Procedure Report  PATIENT NAME: Andrew [name], Andrew [name] DATE/TIME OF PROCEDURE: 12/17/2023 / 09:00 AM  DATE OF BIRTH: [birth year] ENDOSCOPIST: Elston Blanch,  RECORD NUMBER: [id number] REFERRING PHYSICIAN:  FELLOW: ,  INDICATION FOR EXAMINATION: 68 y.o. male with history of gastric peptic ulcer disease, here for second look endoscopy  to ensure healing following course of high-dose PPI therapy.  PROCEDURE PERFORMED : UPPER GI ENDOSCOPY - biopsy  MEDICATIONS: MAC Anesthesia  NOTES : [name] RN [facility]  PROCEDURE TECHNIQUE:  [name] medications, allergies, past medical, surgical, social and family histories were reviewed and updated as  appropriate. A discussion of informed consent was had with the patient and/or the patient's family prior to the  procedure, including sedation. The alternatives, benefits and risks of the procedure including but not limited to  perforation, hemorrhage, infection, adverse drug reaction and aspiration were discussed.  EXTENT OF EXAM: Second portion of duodenum  INSTRUMENTS: [id number] #[id number]  TECHNICALLY DIFFICULT EXAM: No  LIMITATIONS: TOLERANCE: Good VISUALIZATION: Good  FINDINGS:  Esophagus: Normal appearing esophageal mucosa. No evidence of erosive esophagitis, Barrett's esophagus,  or stenosis. Irregular z-line and diaphragmatic pinch seen at 40cm from incisors.  Stomach: There was evidence of scarring from healing prior ulceration seen in antrum; this was characterized  by reactive-appearing mucosal changes but no persistent ulceration. Previously placed OTSC not visualized.  Multiple targeted biopsies taken from edges of scar in four-quadrant fashion as well as from base of scar to  evaluate for dysplasia. Otherwise, stomach appeared to be normal without discrete erosions or ulcerations. Cold  forceps biopsies were additionally taken from the gastric antrum and body per protocol to evaluate for H pylori.  Duodenum: The duodenum appeared to be normal with intact villous architecture.  DIAGNOSIS:  1. Healed prior antral ulceration, appearance improved from prior exam. Biopsies taken as above from prior ulceration scar  to rule out dysplasia and per guidelines to evaluate for H pylori.  2. Otherwise, unremarkable EGD  RECOMMENDATIONS:  - Await biopsy results (will be [facility] to you via [email or web address]), recommend treatment with BQT if found to be  positive for H pylori  UPPER GI ENDOSCOPY Procedure Report  - Continue PPI daily for peptic ulcer disease prophylaxis  - Avoid NSAIDs  This note was electronically signed on 12/17/2023, 11:01:01 AM by Elston Blanch,

## 2023-12-20 LAB — Tissue Exam

## 2023-12-23 ENCOUNTER — Other Ambulatory Visit: Payer: PRIVATE HEALTH INSURANCE

## 2023-12-27 ENCOUNTER — Ambulatory Visit: Payer: Commercial Managed Care - HMO

## 2023-12-27 ENCOUNTER — Ambulatory Visit: Payer: Commercial Managed Care - Pharmacy Benefit Manager

## 2023-12-27 DIAGNOSIS — S8254XD Nondisplaced fracture of medial malleolus of right tibia, subsequent encounter for closed fracture with routine healing: Principal | ICD-10-CM

## 2023-12-27 DIAGNOSIS — I10 Essential (primary) hypertension: Principal | ICD-10-CM

## 2023-12-27 NOTE — Patient Instructions
-   Please start taking losartan  50mg  once a day  - Please get your labs checked the week of Oct 13th

## 2023-12-27 NOTE — Progress Notes
 [facility]  [address]  [address]  [place] [address]  Phone: [phone]  FAX: [phone]    URGENT CARE VISIT - ADULT    Subjective:     CC  Hypertension (Pt states high BP x1 month/Today at home was - 182/124 /C/o headache/States used to take 3 meds, after pacer in June only taking 1)    HPI  Andrew [name] is a 68 y.o. male with a past medical history listed below presenting w/ concern for ''because of high blood pressure.''    Took blood pressure at home and it was 182/124 today. Currently taking metoprolol  tartrate 50mg  BID.    Before pacer placement was taking:  Amlodipine  5mg   Losartan  25mg  BID  Carvedilol     Is worried that his blood pressure has been high since his two other medications have been stopped. In clinic blood pressure 140/100.    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)    Hypertension    Gout    Seborrheic dermatitis    Urinary frequency    H/O aortic valve replacement    Bicuspid aortic valve    Age related osteoporosis    Prediabetes    H/O bicuspid aortic valve    H/O: CVA (cerebrovascular accident)    AF (paroxysmal atrial fibrillation) (HCC/RAF)    Tachy-brady syndrome (HCC/RAF)    Cardiac pacemaker in situ     He has a past medical history of Antral ulcer (09/2023), Atrial fibrillation (HCC/RAF), CVA (cerebral vascular accident) (HCC/RAF) (2013), H/O aortic valve replacement, History of blood transfusion, Hyperlipidemia, Hypertension, Kidney stone, Left-sided weakness, Pacemaker, and Urinary frequency.    Social History  Social History     Socioeconomic History    Marital status: Married   Tobacco Use    Smoking status: Former     Current packs/day: 0.00     Average packs/day: 1 pack/day for 15.0 years (15.0 ttl pk-yrs)     Types: Cigarettes     Start date: 05/03/1975     Quit date: 05/02/1990     Years since quitting: 33.6    Smokeless tobacco: Never   Substance and Sexual Activity    Alcohol use: Not Currently     Comment: occasionally has red wine    Drug use: Not Currently     Social Drivers of Health     Financial Resource Strain: Medium Risk (10/25/2023)    Financial Resource Strain     Difficulty of Paying Living Expenses: Somewhat hard     Medications/Supplements  Medications that the patient states to be currently taking   Medication Sig    ALLOPURINOL  100 mg tablet TAKE 2 TABLETS BY MOUTH EVERY DAY    amiodarone  200 mg tablet Take 1 tablet (200 mg total) by mouth daily.    apixaban  5 mg tablet Take 1 tablet (5 mg total) by mouth two (2) times daily.    ascorbic acid  500 mg tablet Take 1 tablet (500 mg total) by mouth daily.    ATORVASTATIN  20 mg tablet TAKE 1 TABLET BY MOUTH EVERYDAY AT BEDTIME    ferrous gluconate  324 mg tablet Take 1 tablet (324 mg total) by mouth every other day.    metoprolol  tartrate 50 mg tablet Take 1 tablet (50 mg total) by mouth two (  2) times daily.    Multiple Vitamins-Minerals (HCA SUPER THERAVITE-M PO) Take 1 tablet by mouth daily.    omeprazole  20 mg DR capsule Take 1 capsule (20 mg total) by mouth two (2) times daily.    Oyster Shell (CALCIUM  CARBONATE 1250 MG, 500 MG ELEMENTAL,) 1250 mg tablet Take 1 tablet (1,250 mg total) by mouth daily with breakfast.    vitamin D, cholecalciferol , 25 mcg (1000 units) tablet Take 1 tablet (25 mcg total) by mouth daily.     Allergies  Patient has no known allergies.    Objective:     Physical Exam  BP 152/97  ~ Pulse 65  ~ Temp 36.6 ?C (97.8 ?F) (Tympanic)  ~ Resp 19  ~ SpO2 97%   General: appears comfortable, no acute distress, alert, sitting in wheelchair  HEENT: NCAT, MMM  Card: RRR, nl s1, s2, no m/r/g  Pulm: normal work of breathing, good air movement, CTAB no w/r/c  Abd: soft, nontender without rebound or guarding  Ext: warm, well perfused, no LE edema, ankles in braces bilaterally  Skin: no rashes or lesions on exposed skin  Neuro: alert and oriented, responding appropriately, moving all limbs spontaneously    Recent Labs/Studies  No results found for this or any previous visit (from the past week).    Recent Imaging Results  No imaging has been resulted in the last 30 days    Assessment/Plan:   # HTN, uncontrolled  Elevated in clinic and at home. Previously on three agents. Reasonable to restart in step wise fashion for improved BP control.  - continue home metop tartrate 25mg  BID  - start losartan  50mg  qday   - check BMP in 2 weeks  - advised to keep blood pressure log for 1 week prior to upcoming cardiology appointment      Return if symptoms worsen or fail to improve.     New or Modified Medications for this Encounter   New    LOSARTAN  50 MG TABLET    Take 1 tablet (50 mg total) by mouth daily.       Associated Diagnoses: --    Order Dose: 50 mg   Modified    No medications on file   Discontinued Medications    No medications on file        Andrew Steffler M. Cera Rorke, MD, PhD  12/27/2023 at 5:42 PM

## 2023-12-27 NOTE — Progress Notes
 [facility] Department of Orthopaedic Surgery New Patient Visit    Referred by:  Flint Corporal, Alan HERO., MD  Primary Care Physician:  Jeana Francisco, MD    Chief Complaint: Right medial ankle pain    History of Present Illness: Andrew [name] is a 68 y.o.-year-old male who presents with a 4.5 month history of right medial ankle pain. The patient reports the pain is located at the medial ankle and the onset of pain was noted acutely after an injury sustained after a slip. Of note, the patient has a history of a stroke with residual  left-sided weakness  and is wheel chair bound. The pain is described as dull.  It is mild (1-4) in severity and it is less than in previous weeks. It is well localized, does not radiate proximally or distally, and it is not associated with numbness or tingling. Exacerbating factors include: activity and walking. Rest and elevation was effective in reducing the pain and swelling. He has also previously been in a boot for immobilization. They have not had prior surgery related to their current complaint. They are here today for further evaluation and discussion of their management options.    May 15th slipped, wore a boot, Dr. Tyrone    Now pain is resolved    Stroke in past leftg sided weakness    Healed medial mal fx, no pain, can walk witrh walker    Plan:    PRN  L AFO script        Review of systems: Review of Systems   Neurological:  Positive for weakness.        Medical History:    Past Medical History:   Diagnosis Date    Antral ulcer 09/2023    Atrial fibrillation (HCC/RAF)     CVA (cerebral vascular accident) (HCC/RAF) 2013    H/O aortic valve replacement     History of blood transfusion     Hyperlipidemia     Hypertension     Kidney stone     Left-sided weakness     Pacemaker     Urinary frequency      Surgical History:    Past Surgical History:   Procedure Laterality Date    AORTIC VALVE REPLACEMENT      CARDIAC PACEMAKER PLACEMENT  09/2023    ESOPHAGOGASTRODUODENOSCOPY 09/2023      Family History:     Family History       Relation Status Problems (Age of Onset) - (Comment)    Mother  No Known Problems    Father  Other    Sister  Uterine cancer    Brother  Stroke ;  Kidney failure           Pertinent Negatives      Neg Hx  Colon cancer ;  Lung cancer ;  Prostate cancer           Social History:    Social History     Socioeconomic History    Marital status: Married   Tobacco Use    Smoking status: Former     Current packs/day: 0.00     Average packs/day: 1 pack/day for 15.0 years (15.0 ttl pk-yrs)     Types: Cigarettes     Start date: 05/03/1975     Quit date: 05/02/1990     Years since quitting: 33.6    Smokeless tobacco: Never   Substance and Sexual Activity    Alcohol use: Not Currently     Comment:  occasionally has red wine    Drug use: Not Currently     Social Drivers of Health     Financial Resource Strain: Medium Risk (10/25/2023)    Financial Resource Strain     Difficulty of Paying Living Expenses: Somewhat hard     Allergies:  No Known Allergies    Medications:    Current Outpatient Medications:     ALLOPURINOL  100 mg tablet, TAKE 2 TABLETS BY MOUTH EVERY DAY, Disp: 180 tablet, Rfl: 1    amiodarone  200 mg tablet, Take 1 tablet (200 mg total) by mouth daily., Disp: 90 tablet, Rfl: 3    amoxicillin 500 mg capsule, Use as directed prior to dental appointment, Disp: , Rfl:     apixaban  5 mg tablet, Take 1 tablet (5 mg total) by mouth two (2) times daily., Disp: 180 tablet, Rfl: 3    ascorbic acid  500 mg tablet, Take 1 tablet (500 mg total) by mouth daily., Disp: , Rfl:     ATORVASTATIN  20 mg tablet, TAKE 1 TABLET BY MOUTH EVERYDAY AT BEDTIME, Disp: 90 tablet, Rfl: 3    ferrous gluconate  324 mg tablet, Take 1 tablet (324 mg total) by mouth every other day., Disp: 45 tablet, Rfl: 1    metoprolol  tartrate 50 mg tablet, Take 1 tablet (50 mg total) by mouth two (2) times daily., Disp: 180 tablet, Rfl: 3    Multiple Vitamins-Minerals (HCA SUPER THERAVITE-M PO), Take 1 tablet by mouth daily., Disp: , Rfl:     omeprazole  20 mg DR capsule, Take 1 capsule (20 mg total) by mouth two (2) times daily., Disp: 60 capsule, Rfl: 3    Oyster Shell (CALCIUM  CARBONATE 1250 MG, 500 MG ELEMENTAL,) 1250 mg tablet, Take 1 tablet (1,250 mg total) by mouth daily with breakfast., Disp: 120 tablet, Rfl: 2    vitamin D, cholecalciferol , 25 mcg (1000 units) tablet, Take 1 tablet (25 mcg total) by mouth daily., Disp: , Rfl:      Physical Exam:         There were no vitals filed for this visit.    General Appearance:  The patient is well-developed, well-nourished 68 y.o. male in no apparent distress.     Estimated body mass index is 29.05 kg/m? as calculated from the following:    Height as of this encounter: 5' 6'' (1.676 m).    Weight as of this encounter: 180 lb (81.6 kg).     Musculoskeletal:      Inspection - No swelling or ecchymosis noted about the foot or ankle, standing exam deferred due to stroke.    Gait - standing exam deferred due to stroke    Palpation - Non-tender over the medial malleolus and rest of the ankle and foot    Range of motion - Normal arc of motion at the Ankle, Normal arc of motion at the subtalar joint motion.    Special tests - No increased laxity with anterior drawer compared to contralateral ankle, Negative talar tilt, and No subluxation or dislocation of the peroneal tendons with ankle circumduction.    Vascular:  DP and PT pulses are 2+ of the bilateral lower extremities.   Cap Refill is < 2 seconds to all toes of the bilateral feet.     Neurological:  no focal deficits to the right lower extremity, motor strength is 5/5 in the right lower extremity, sensation intact to light touch to the right lower extremity.     Skin:  Negative for rashes,  ulcerations or abrasions.    Labs & Imaging:      Radiographs of the right ankle were ordered and independently interpreted and demonstrate fibrous union of the medial malleolar fracture. No significant degenerative changes or mal-alignment. Assessment:    Right medial malleolar fibrous union    Plan:      The assessment, imaging, physical examination and relevant treatment options were discussed.  Given that the patient has no displacement of the fracture and has no pain on physical examination today.  It is likely the patient has gone onto a fibrous union.  As such, the patient does not require any further treatment for this.  He may continue to increase his activities as tolerated.  He should continue to use baseline assistive devices for mobility.  Should he develop recurrent pain in this area I advised him to follow up for repeat evaluation and to discuss management options.  The patient expresses his understanding of the above discussion and will follow up as needed for any new or worsening concerns.  All his questions were answered.    30 minutes were spent personally by me today on this encounter which include today's pre-visit review of the chart, obtaining appropriate history, performing an evaluation, documentation and discussion of management with details supported within the note for today's visit. The time documented was exclusive of any time spent on the separately billed procedure.    Cara Barlow, MD, MPH  Orthopaedic Foot & Ankle Surgeon  Assistant Clinical Professor  Department of Orthopaedic Surgery  [facility]

## 2023-12-28 MED ORDER — LOSARTAN POTASSIUM 50 MG PO TABS
50 mg | ORAL_TABLET | Freq: Every day | ORAL | 3 refills | 60.00000 days | Status: AC
Start: 2023-12-28 — End: 2024-01-05

## 2023-12-30 ENCOUNTER — Other Ambulatory Visit: Payer: PRIVATE HEALTH INSURANCE

## 2024-01-04 ENCOUNTER — Inpatient Hospital Stay
Admission: EM | Admit: 2024-01-04 | Discharge: 2024-01-04 | Disposition: A | Discharge status: Home / Self Care | Payer: Commercial Managed Care - HMO | Source: Home / Self Care

## 2024-01-04 ENCOUNTER — Ambulatory Visit: Payer: Commercial Managed Care - HMO

## 2024-01-04 DIAGNOSIS — I159 Secondary hypertension, unspecified: Principal | ICD-10-CM

## 2024-01-04 DIAGNOSIS — R519 Nonintractable headache, unspecified chronicity pattern, unspecified headache type: Secondary | ICD-10-CM

## 2024-01-04 LAB — Urea Nitrogen: UREA NITROGEN: 18 mg/dL (ref 7–22)

## 2024-01-04 LAB — CREATININE: ESTIMATED GFR 2021 CKD-EPI: >89 mL/min/1.73m2 (ref 0.60–1.30)

## 2024-01-04 LAB — Hepatic Funct Panel: ALKALINE PHOSPHATASE: 94 U/L (ref 37–133)

## 2024-01-04 LAB — CBC: RED CELL DISTRIBUTION WIDTH-SD: 51.8 fL — ABNORMAL HIGH (ref 36.9–48.3)

## 2024-01-04 LAB — HS Troponin I (Reflexed): HIGH SENSITIVITY TROPONIN I: 12 ng/L — ABNORMAL HIGH (ref <5)

## 2024-01-04 LAB — HS Troponin I + Reflex If >=  5 ng/L: HIGH SENSITIVITY TROPONIN I: 10 ng/L — ABNORMAL HIGH (ref <5)

## 2024-01-04 LAB — Electrolyte Panel: ANION GAP: 16 mmol/L (ref 8–19)

## 2024-01-04 LAB — Differential Automated: NEUTROPHIL PERCENT, AUTO: 79 % (ref 0.20–0.80)

## 2024-01-04 LAB — Glucose, Whole Blood: GLUCOSE, WHOLE BLOOD: 125 mg/dL — ABNORMAL HIGH (ref 65–99)

## 2024-01-04 MED ORDER — LOSARTAN POTASSIUM 100 MG PO TABS
100 mg | ORAL_TABLET | Freq: Every day | ORAL | 0 refills | 90.00000 days | Status: AC
Start: 2024-01-04 — End: 2024-01-09

## 2024-01-04 MED ADMIN — LABETALOL HCL 5 MG/ML IV SOLN: 10 mg | INTRAVENOUS | @ 18:00:00 | Stop: 2024-01-04 | NDC 72266010201

## 2024-01-04 MED ADMIN — LOSARTAN POTASSIUM 50 MG PO TABS: 50 mg | ORAL | @ 20:00:00 | Stop: 2024-01-04 | NDC 50268050511

## 2024-01-04 NOTE — Discharge Instructions
 Emergency Department Discharge Instructions    Please start 100 mg of losartan  now  Summary of your visit  You have been evaluated in the [facility] Emergency Department today for hypertension (high blood pressure).  We have observed you in the ER and have determined that you are stable for discharge at this time.    Follow-Up  Please follow up with your primary care physician within three days after being discharged from the ER - you can call to schedule an appointment.  You can find a primary care physician at [facility] by calling [phone].    If you do not have a Primary Care Physician, please call your insurance company or you may call [phone] to establish care with a [facility] physician.     Return to the Emergency Department if you experience:  Chest pain  Shortness of breath  Moderate to severe headache   Numbness/weakness/tingling in the muscles of your face, arms, or legs  Trouble speaking  Extreme drowsiness, confusion, fainting, or dizziness  Vision changes.   Any other concerning symptoms     Thank you for choosing [facility] for your care. It was a pleasure taking part in your care today, and we wish you the best!

## 2024-01-04 NOTE — ED Provider Notes
 Andrew [name] [facility]  Emergency Department Service Report    Triage     Andrew [name], a 68 y.o. male, presents with Hypertension (Hypertensive at home 160's systolic. Patient hx of pacemaker. Normal BP for patient is in the 120's. Endorses headache and nausea. Took metoprolol  and losartan  at 0700)    Arrived on 01/04/2024 at 8:49 AM   Arrived by Wheelchair [4]    ED Triage Vitals   Temp Temp src BP Heart Rate Resp SpO2 O2 Device Pain Score Weight   01/04/24 0852 -- 01/04/24 9147 01/04/24 9147 01/04/24 9147 01/04/24 9147 01/04/24 0852 01/04/24 0852 01/04/24 0853   36.7 ?C (98 ?F)  175/113 63 18 98 % None (Room air) Three 86.2 kg (190 lb)       No Known Allergies     Initial Physician Contact       Initial Contact Completed?: Yes (01/04/24 0931)    History   HPI      Patient is a 68 year old male with a past medical history of AFib status post ppm 09-2023 presenting with headache and hypertension.  Patient said for the past 2 weeks as hypertension has been worse.  He has been SBP 160s 170s at home.  Patient said he woke up with a headache today.  Patient said does not often get headaches.  Patient with no blurry vision positive nausea no emesis.  Patient has no abdominal pain.    Language Assistance                     Past Medical History:   Diagnosis Date    Antral ulcer 09/2023    Atrial fibrillation (HCC/RAF)     CVA (cerebral vascular accident) (HCC/RAF) 2013    H/O aortic valve replacement     History of blood transfusion     Hyperlipidemia     Hypertension     Kidney stone     Left-sided weakness     Pacemaker     Urinary frequency         Past Surgical History:   Procedure Laterality Date    AORTIC VALVE REPLACEMENT      CARDIAC PACEMAKER PLACEMENT  09/2023    ESOPHAGOGASTRODUODENOSCOPY  09/2023        Past Family History   family history includes Kidney failure in his brother; No Known Problems in his mother; Stroke in his brother; Uterine cancer in his sister; gastric ulcer in his father. Past Social History   he reports that he quit smoking about 33 years ago. His smoking use included cigarettes. He started smoking about 48 years ago. He has a 15 pack-year smoking history. He has never used smokeless tobacco. He reports that he does not currently use alcohol. He reports that he does not currently use drugs. No history on file for sexual activity.       Physical Exam   Physical Exam          Physical Exam   Vital signs:   ED Triage Vitals   Temp Temp src BP Heart Rate Resp SpO2 O2 Device Pain Score Weight   01/04/24 0852 -- 01/04/24 9147 01/04/24 9147 01/04/24 9147 01/04/24 9147 01/04/24 9147 01/04/24 0852 01/04/24 0853   36.7 ?C (98 ?F)  175/113 63 18 98 % None (Room air) Three 86.2 kg (190 lb)    (reviewed by me)    General: well-developed, well-nourished, appears stated age  HENT: NC/AT, MMM  Eye: EOMI, no  conjunctival injection, lids are symmetric  Neck: supple, trachea midline  CV: RRR, symmetric radial pulses, no LE edema  Resp: CTA bilaterally, no crackles or wheezes, unlabored on room air  Abd: soft, nontender, nondistended, no guarding or rebound  MSK: no obvious deformity or TTP; full observed ROM; WWP, SILT  Skin: warm, dry, no pallor  Neuro: sensation and movement grossly intact to all four extremities        Medical Decision Making   Andrew [name] is a 68 y.o. male Patient presenting HTN. Has a known history of HTN   Patient endorsing compliance with medications, without evidence of end organ damage on history or exam, presenting feeling fatigued and with headache.  Given his headache we will get a CT scan.  We will also get basic labs still assess for end-organ damage.  Will increase dosage of patient?s meds and have patient f/u with PCP in 2 days     Alternative diagnoses that were considered but unlikely given presentation include: toxidrome, anxiety disorder, heart failure, thyroid disorder, ACS, stroke       We will increase losartan  to 100 mg and have them follow up with the primary care doctor            Medical Decision Making     Problems  Clinical Impressions Complexity of problems addressed         Secondary hypertension (Primary)  Nonintractable headache, unspecified chronicity pattern, unspecified headache type High:  []  Acute/chronic illness/injury with threat to life to bodily function  []  Chronic illness with severe exacerbation, progression, or side effects of treatment    Moderate:  []  Undiagnosed new problem with uncertain prognosis  []  Acute illness with systemic symptoms  []  Acute complicated injury    Data and Risk  Independent Historian []  Parent as child too young to provide hx []  Family/Caregiver due to AMS/dementia []  EMS due to medical acuity/trauma []  Family/EMS due to behavioral health concern and for collateral []    External Data Reviewed previous workup and mgmt of patient's Hypertension via  []  Previous [place] Notes/Labs/Imaging []  External Notes/Labs/Imaging  which were non-contributory unless documented otherwise in HPI and ED Course   Considered but decided against  []  CT Head/C-spine given [place] CT/NEXUS/PECARN criteria []  CTA Chest to r/o PE given PERC/Well's criteria []  CT AP to r/o appendicitis given PAS score or family discussion []  Hospitalization due to *  []    Discussed w/ ext HCP []  Consults, PCP/outpt specialists, nursing home. See ED Course for details.   SDOH Affecting Dx/Tx []  Insurance limiting specialist referral []  Housing instability limiting outpt mgmt   []  Financial insecurity limiting medication access []  Substance/ETOH use []    Care de-escalation []  Shared decision-making regarding de-escalation of care (e.x. DNR) or foregoing hospitalization-level of care due to patient wishes/goals of care   Interpretations See ED Course. []     If applicable, parenteral controlled substances, drug therapies requiring intensive monitoring for toxicity, and prescription drug management are documented in the the Medications section of this note. If applicable, major and minor procedures are documented separately in Procedure Notes.           ED Course/ Progress Notes / Reassessments     ED Course as of 01/04/24 1257   Sat Jan 04, 2024   1054 CT brain wo contrast  IMPRESSION:     No evidence of acute intracranial hemorrhage or mass effect.  Right caudate head encephalomalacia with expected elevation of the right frontal horn   [  MB]      ED Course User Index  [MB] Lennie Eleanor CROME., MD                    Laboratory Results     Labs Reviewed   GLUCOSE - Abnormal; Notable for the following components:       Result Value    Glucose 125 (*)     All other components within normal limits   HS TROPONIN I + REFLEX IF >= 5 NG/L - Abnormal; Notable for the following components:    High Sensitivity Troponin I 10 (*)     All other components within normal limits   CBC (PERFORMABLE) - Abnormal; Notable for the following components:    Hemoglobin 12.6 (*)     Mean Corpuscular Volume 73.1 (*)     Mean Corpuscular Hemoglobin 22.1 (*)     MCH Concentration 30.3 (*)     Red Cell Distribution Width-SD 51.8 (*)     Red Cell Distribution Width-CV 20.5 (*)     All other components within normal limits   DIFFERENTIAL, AUTOMATED (PERFORMABLE) - Abnormal; Notable for the following components:    Absolute Lymphocyte Count 1.01 (*)     All other components within normal limits   HS TROPONIN I (REFLEXED) - Abnormal; Notable for the following components:    High Sensitivity Troponin I 12 (*)     All other components within normal limits   ELECTROLYTE PANEL - Normal   UREA NITROGEN - Normal   HEPATIC FUNCT PANEL - Normal   CBC & AUTO DIFFERENTIAL    Narrative:     The following orders were created for panel order CBC & Auto Differential.  Procedure                               Abnormality         Status                     ---------                               -----------         ------                     RAR[[id number]]                          Abnormal            Final result Differential, Automated[[id number]]      Abnormal            Final result                 Please view results for these tests on the individual orders.   CREATININE,WHOLE BLOOD       Imaging Results     CT brain wo contrast   Final Result by Jackolyn Teretha FALCON., MD (10/04 1050)   IMPRESSION:      No evidence of acute intracranial hemorrhage or mass effect.   Right caudate head encephalomalacia with expected elevation of the right frontal horn      Signed by: Teretha Jackolyn   01/04/2024 10:50 AM          Consults     Consult Orders Placed This Encounter  None            Clinical Impressions           Secondary hypertension (Primary)  Nonintractable headache, unspecified chronicity pattern, unspecified headache type       Disposition and Follow-up     Disposition: Discharge [1] 01/04/2024 12:55 PM    Future Appointments   Date Time Provider Department Center   01/15/2024  3:40 PM Vucicevic, Darko, MD CRD [facility] [address]/Cen   01/16/2024  2:00 PM ARS, MP1 CRD DEVICE [place]/Cen   01/16/2024  2:30 PM Myrle Fines, MD CRD EP [address] [place]/Cen   02/06/2024  3:45 PM Jeana Francisco, MD IMRES [phone] [place]/Cen       Follow up with:  Jeana Francisco, MD  [address]  [address]  [place] [address]  [phone]    Schedule an appointment as soon as possible for a visit         Return precautions are specified on After Visit Summary.    New Prescriptions    LOSARTAN  100 MG TABLET    Take 1 tablet (100 mg total) by mouth daily.       Medications Administered This Encounter           Status .     losartan  tab 50 mg  STAT         Last MAR action: Given      labetalol 5 mg/mL inj 10 mg  STAT         Last MAR action: Given              labetalol 5 mg/mL inj 10 mg is a High-risk medication as continuous ECG monitoring and frequent blood pressure checks are required.             Resident Signature                                Lennie Eleanor CROME., MD  Resident  01/04/24 1257  ED Attending Attestation    I have discussed the management with the resident, was present with the resident during patient care services, have reviewed the resident note and agree with the documented findings and plan of care.    Alm FELIX Tatum Massman, MD  01/05/2024 6:53 AM      Laroy Alm LABOR., MD  01/05/24 [phone]

## 2024-01-06 ENCOUNTER — Other Ambulatory Visit: Payer: Commercial Managed Care - Pharmacy Benefit Manager

## 2024-01-08 ENCOUNTER — Ambulatory Visit: Payer: PRIVATE HEALTH INSURANCE

## 2024-01-08 DIAGNOSIS — I10 Essential (primary) hypertension: Principal | ICD-10-CM

## 2024-01-08 NOTE — Progress Notes
 [facility]  [address]  [address]  [place] [address]  Phone: [phone]  FAX: [phone]    URGENT CARE VISIT - ADULT    Subjective:     CC  Hypertension (Pt presents with elevated BP, per spouse pt was seen on 9/26 for high blood pressure.  Losartan  was re started, was seen in ED shortly after said visit and  dose was increased with instructions to follow up with cardiologist. Spouse states plan is to add carvedilol  as well. )    HPI  Andrew [name] is a 68 y.o. male with PMH htn, presenting w/ concern for elevated BP    Pt was seen in urgent care a few weeks ago for same, then was seen in ER on 10/4 for elevated BP and HA. Now presents     143/103 in the morning home BP measurement  Then went to PT,where the therapy session was cancelled due to high BP, reported to me as 165/112    Last time saw Dr Jeana PCP     Used to be on losartan , mtp, and amlodipine . They were stopped due to pacemaker and have not been completely resumed yet.     Was rx'd losartan  50 on 9/26 in urgent care. Was increased to 100mg  after ER visit.     Did communicate with cardiologist, who recommended resuming prior amlodipine  if not improved, however thisnote not available to me.       Currently asymptomatic but woke up with HA which is now gone    Review of Systems  Complete review of systems was performed and found to be negative except as noted in HPI.     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)    Hypertension    Gout    Seborrheic dermatitis    Urinary frequency    H/O aortic valve replacement    Bicuspid aortic valve    Age related osteoporosis    Prediabetes    H/O bicuspid aortic valve    H/O: CVA (cerebrovascular accident)    AF (paroxysmal atrial fibrillation) (HCC/RAF)    Tachy-brady syndrome (HCC/RAF)    Cardiac pacemaker in situ     He has a past medical history of Antral ulcer (09/2023), Atrial fibrillation (HCC/RAF), CVA (cerebral vascular accident) (HCC/RAF) (2013), H/O aortic valve replacement, History of blood transfusion, Hyperlipidemia, Hypertension, Kidney stone, Left-sided weakness, Pacemaker, and Urinary frequency.    Social History  Social History     Socioeconomic History    Marital status: Married   Tobacco Use    Smoking status: Former     Current packs/day: 0.00     Average packs/day: 1 pack/day for 15.0 years (15.0 ttl pk-yrs)     Types: Cigarettes     Start date: 05/03/1975     Quit date: 05/02/1990     Years since quitting: 33.7    Smokeless tobacco: Never   Substance and Sexual Activity    Alcohol use: Not Currently     Comment: occasionally has red wine    Drug use: Not Currently     Social Drivers of Health     Financial Resource Strain: Medium Risk (10/25/2023)    Financial Resource Strain     Difficulty of Paying Living Expenses: Somewhat hard  Medications/Supplements  Medications that the patient states to be currently taking   Medication Sig    ALLOPURINOL  100 mg tablet TAKE 2 TABLETS BY MOUTH EVERY DAY    amiodarone  200 mg tablet Take 1 tablet (200 mg total) by mouth daily.    amoxicillin 500 mg capsule Use as directed prior to dental appointment    apixaban  5 mg tablet Take 1 tablet (5 mg total) by mouth two (2) times daily.    ascorbic acid  500 mg tablet Take 1 tablet (500 mg total) by mouth daily.    ATORVASTATIN  20 mg tablet TAKE 1 TABLET BY MOUTH EVERYDAY AT BEDTIME    ferrous gluconate  324 mg tablet Take 1 tablet (324 mg total) by mouth every other day.    losartan  100 mg tablet Take 1 tablet (100 mg total) by mouth daily.    metoprolol  tartrate 50 mg tablet Take 1 tablet (50 mg total) by mouth two (2) times daily.    Multiple Vitamins-Minerals (HCA SUPER THERAVITE-M PO) Take 1 tablet by mouth daily.    omeprazole  20 mg DR capsule Take 1 capsule (20 mg total) by mouth two (2) times daily. Oyster Shell (CALCIUM  CARBONATE 1250 MG, 500 MG ELEMENTAL,) 1250 mg tablet Take 1 tablet (1,250 mg total) by mouth daily with breakfast.    vitamin D, cholecalciferol , 25 mcg (1000 units) tablet Take 1 tablet (25 mcg total) by mouth daily.     Allergies  Patient has no known allergies.    Objective:     Physical Exam  BP 152/108  ~ Pulse 91  ~ Temp 36.1 ?C (97 ?F) (Tympanic)  ~ Resp 18  ~ SpO2 97%   General: Appears comfortable, no acute distress, A&Ox4  HEENT: NCAT, PERRL, sclera non-icteric, OP clear, MMM  Neck: Supple, FROM, no stridor  Resp: CTAB, no r/w/r  CV: RRR, no r/g/m  Abd: Soft, nontender, no palpable masses  Skin: No rashes, no jaundice  Ext: No c/c/e, WWP  Musculoskeletal: FROM all joints, nontender extremities  Neuro: MAE spont. Sensation grossly intact throughout  Psychiatric: Mood, memory, affect and judgment normal    Recent Labs/Studies  Recent Results (from the past week)   Electrolyte Panel (Na, K, Cl, CO2)    Collection Time: 01/04/24  9:46 AM   Result Value Ref Range    Sodium 139 135 - 146 mmol/L    Potassium 4.3 3.6 - 5.3 mmol/L    Chloride 103 96 - 106 mmol/L    Total CO2 20 20 - 30 mmol/L    Anion Gap 16 8 - 19 mmol/L   Urea Nitrogen    Collection Time: 01/04/24  9:46 AM   Result Value Ref Range    Urea Nitrogen 18 7 - 22 mg/dL   Creatinine    Collection Time: 01/04/24  9:46 AM   Result Value Ref Range    Creatinine 0.81 0.60 - 1.30 mg/dL    Estimated GFR >10 See GFR Additional Information mL/min/1.53m2    GFR Additional Information See Comment    Glucose    Collection Time: 01/04/24  9:46 AM   Result Value Ref Range    Glucose 125 (H) 65 - 99 mg/dL   HS Troponin + 2h reflex IF >= 5 ng/L    Collection Time: 01/04/24  9:46 AM   Result Value Ref Range    High Sensitivity Troponin I 10 (H) <5 ng/L   Hepatic Funct Panel    Collection Time: 01/04/24  9:46 AM  Result Value Ref Range    Total Protein 8.0 6.1 - 8.2 g/dL    Albumin 4.1 3.9 - 5.0 g/dL    Bilirubin,Total 0.4 0.1 - 1.2 mg/dL Bilirubin,Conjugated <9.7 <=0.3 mg/dL    Alkaline Phosphatase 94 37 - 133 U/L    Aspartate Aminotransferase 28 13 - 62 U/L    Alanine Aminotransferase 22 8 - 70 U/L   CBC    Collection Time: 01/04/24  9:46 AM   Result Value Ref Range    White Blood Cell Count 7.13 4.16 - 9.95 x10E3/uL    Red Blood Cell Count 5.69 4.41 - 5.95 x10E6/uL    Hemoglobin 12.6 (L) 13.5 - 17.1 g/dL    Hematocrit 58.3 61.4 - 52.0 %    Mean Corpuscular Volume 73.1 (L) 79.3 - 98.6 fL    Mean Corpuscular Hemoglobin 22.1 (L) 26.4 - 33.4 pg    MCH Concentration 30.3 (L) 31.5 - 35.5 g/dL    Red Cell Distribution Width-SD 51.8 (H) 36.9 - 48.3 fL    Red Cell Distribution Width-CV 20.5 (H) 11.1 - 15.5 %    Platelet Count, Auto 203 143 - 398 x10E3/uL    Mean Platelet Volume 10.2 9.3 - 13.0 fL    Nucleated RBC%, automated 0.0 No Ref. Range %    Absolute Nucleated RBC Count 0.00 0.00 - 0.00 x10E3/uL    Neutrophil Abs (Prelim) 5.64 See Absolute Neut Ct. x10E3/uL   Differential, Automated    Collection Time: 01/04/24  9:46 AM   Result Value Ref Range    Neutrophil Percent, Auto 79.0 No Ref. Range %    Lymphocyte Percent, Auto 14.2 No Ref. Range %    Monocyte Percent, Auto 4.8 No Ref. Range %    Eosinophil Percent, Auto 1.4 No Ref. Range %    Basophil Percent, Auto 0.3 No Ref. Range %    Immature Granulocytes% 0.3 No Reference Range %    Absolute Neut Count 5.64 1.80 - 6.90 x10E3/uL    Absolute Lymphocyte Count 1.01 (L) 1.30 - 3.40 x10E3/uL    Absolute Mono Count 0.34 0.20 - 0.80 x10E3/uL    Absolute Eos Count 0.10 0.00 - 0.50 x10E3/uL    Absolute Baso Count 0.02 0.00 - 0.10 x10E3/uL    Absolute Immature Gran Count 0.02 0.00 - 0.04 x10E3/uL   HS Troponin I (Reflexed)    Collection Time: 01/04/24 12:07 PM   Result Value Ref Range    High Sensitivity Troponin I 12 (H) <5 ng/L       Recent Imaging Results  CT brain wo contrast  Result Date: 01/04/2024  IMPRESSION: No evidence of acute intracranial hemorrhage or mass effect. Right caudate head encephalomalacia with expected elevation of the right frontal horn Signed by: Teretha Acron   01/04/2024 10:50 AM      Assessment/Plan:     1. Hypertension, essential          Findings most consistent with essential HTN.      Reasonable to resume prior amlodipine  5mg , and to reduce losartan  back to 50mg  po daily (per my review this is what he was on prior to PM. In addition to MTP.     Pt scheduled to f/u with cardiologist in 1 week already    No follow-ups on file.     New or Modified Medications for this Encounter   New    No medications on file   Modified    No medications on file   Discontinued Medications  No medications on file        Prentice Roam, MD  01/08/2024 at 7:58 PM

## 2024-01-09 MED ORDER — AMLODIPINE BESYLATE 5 MG PO TABS
5 mg | ORAL_TABLET | Freq: Every day | ORAL | 3 refills | 90.00000 days | Status: AC
Start: 2024-01-09 — End: ?

## 2024-01-09 MED ORDER — LOSARTAN POTASSIUM 50 MG PO TABS
50 mg | ORAL_TABLET | Freq: Two times a day (BID) | ORAL | 0 refills | 90.00000 days | Status: AC
Start: 2024-01-09 — End: ?

## 2024-01-13 NOTE — Addendum Note
 Addended by: BONNER CLAUDETTE PELLEGRINI on: 01/13/2024 07:53 AM     Modules accepted: Orders

## 2024-01-14 ENCOUNTER — Other Ambulatory Visit: Payer: PRIVATE HEALTH INSURANCE

## 2024-01-15 ENCOUNTER — Ambulatory Visit: Payer: Commercial Managed Care - Pharmacy Benefit Manager | Attending: Cardiovascular Disease

## 2024-01-15 DIAGNOSIS — Z952 Presence of prosthetic heart valve: Secondary | ICD-10-CM

## 2024-01-15 DIAGNOSIS — I10 Essential (primary) hypertension: Principal | ICD-10-CM

## 2024-01-15 NOTE — Progress Notes
 Cardiology Clinic    PATIENT: Andrew [name]  MRN: [id number]  DOB: 07-Nov-1955  DATE OF SERVICE: 01/15/2024      REFERRING PHYSICIAN: Jeana Francisco, MD    REASON FOR VISIT:  History of aortic valve replacement, routine follow-up    HISTORY OF PRESENT ILLNESS:  Andrew [name] is a delightful 68 y.o. gentleman with past medical history significant for bicuspid aortic valve status post aortic valve replacement in January of 2013 with perioperative CVA, chronic ST elevation in inferior leads, hypertension.  He was referred to our Cardiology Clinic by Dr. Elsie Babe to establish care.  I reviewed his previous records in detail.    Andrew [name] has a history of bicuspid aortic valve.  In January 2013 he underwent aortic valve replacement with bioprosthetic valve at [facility].  Unfortunately, after the procedure he had CVA with residual left leg paresis.  In the perioperative period he was found to have ST elevations in the inferior leads for which he underwent coronary angiogram which was normal.  He had repeat angiogram in May of 2013 for similar findings.  He also has a history of hypertension for which he is on amlodipine  and losartan .    Andrew [name] reports of doing well from cardiovascular standpoint.  He denies of having any chest pain, shortness of breath, orthopnea or paroxysmal nocturnal dyspnea.  Furthermore, he denies of having any dizziness, palpitations or lower extremity edema.  His blood pressure has been well controlled at home.    Andrew [name] is originally from [place]  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  He lives in [place].  He stopped smoking 40 years ago.  He drinks alcohol seldomly in social situations.  There is no history of illicit drug use.  His family history significant for brother who had stroke in his 38s.    Visit 09/21/2020  No change from cardiovascular standpoint.  He denies of having any chest pain, shortness of breath, orthopnea paroxysmal nocturnal dyspnea.  Furthermore, he denies of having any lightheadedness, lower extremity edema or palpitations.  Limited in ambulation due to left-sided hemiparesis as a consequence of stroke.  Had echocardiogram this morning.  Final read pending.  Normal valve function.  Good blood pressure control at home.    Visit 03/08/2021:  Doing well. Continues to do physical therapy. Feels stronger.   No chest pain, shortness of breath, palpitations or dizziness.   BP has been normal at home.  Limited ambulation due to hemiparesis, can walk with a walker.   Echocardiogram and CT chest stable.  Planning to leave for [place] in the next few weeks. Will stay for three months.     Visit 10/11/2021:  He continues to do well from cardiovascular perspective. More specifically he denies of having any chest pain, shortness of breath, orthopnea or paroxysmal nocturnal dyspnea. Furthermore, he denies of having any palpitations, LE edema or lightheadedness.   Completed physical theapy.   Limited ambulation due to hemiparesis, can walk with a walker.   BP has been normal  Stayed in [place] for 3 months    Visit 12/29/2021:  He continues to do well from cardiovascular perspective. More specifically he denies of having any chest pain, shortness of breath, orthopnea or paroxysmal nocturnal dyspnea. Furthermore, he denies of having any palpitations, LE edema or lightheadedness.   Can walk a little bit a walker.   BP has been in the 100 range.  Visit 05/04/2022:  Doing well. No chest pain, shortness of breath, palpitations or dizziness.   Can walk a little bit with a walker.   BP has been normal at home. Checking twice a day.   Still taking carvedilol , losartan  and amlodipine .     Visit 09/07/2022:  Just came back from [place]   Can walk with a walker.   BP normal at home.   No CP, SOB, palpitations or dizziness.   Had echocardiogram today. Final read pending.     Visit 05/29/2023:  Doing well from cardiovascular standpoint. Denies chest pain, SOB, palpitations or dizziness.   Walking very little with a walker.   BP has been normal at home.   Echocardiogram has been stable.    Visit 10/09/2023:  Recent hospitalization with tachy brady syndrome. Received PPM on 09/14/2023  Upon discharge developed melena. Found to have antral ulcerations. Status post clip  A little bit more SOB than before. No chest pain or palpitations. No dizziness.   BP has been normal at home.   Scheduled to see Dr. Myrle on 10/16/2023  Going to [place] in one month. Will stay there for one month.      INTERVAL HISTORY:  Visit 1015/2025:  Doing well from cardiovascular standpoint. Denies chest pain, SOB, palpitations or dizziness.   No bleeding.  Scheduled to see Dr. Myrle tomorrow for [facility] care.   BP has been high at home.    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)    Hypertension    Gout    Seborrheic dermatitis    Urinary frequency    H/O aortic valve replacement    Bicuspid aortic valve    Age related osteoporosis    Prediabetes    H/O bicuspid aortic valve    H/O: CVA (cerebrovascular accident)    AF (paroxysmal atrial fibrillation) (HCC/RAF)    Tachy-brady syndrome (HCC/RAF)    Cardiac pacemaker in situ       PAST SURGICAL HISTORY:  Past Surgical History:   Procedure Laterality Date    AORTIC VALVE REPLACEMENT      CARDIAC PACEMAKER PLACEMENT  09/2023    ESOPHAGOGASTRODUODENOSCOPY  09/2023       ALLERGIES:  No Known Allergies    SOCIAL HISTORY:  Social History     Tobacco Use    Smoking status: Former     Current packs/day: 0.00     Average packs/day: 1 pack/day for 15.0 years (15.0 ttl pk-yrs)     Types: Cigarettes     Start date: 05/03/1975     Quit date: 05/02/1990     Years since quitting: 33.7    Smokeless tobacco: Never   Substance Use Topics    Alcohol use: Not Currently     Comment: occasionally has red wine    Drug use: Not Currently       FAMILY HISTORY:  Family History   Problem Relation Age of Onset    No Known Problems Mother     Other (gastric ulcer) Father     Uterine cancer Sister     Stroke Brother     Kidney failure Brother     Colon cancer Neg Hx     Lung cancer Neg Hx     Prostate cancer Neg Hx  REVIEW OF SYSTEMS:  All 14 systems were reviewed and were negative except as mentioned in the HPI    PHYSICAL EXAMINATION:  VITALS: BP 113/75  ~ Pulse 72  ~ Temp 36.2 ?C (97.2 ?F) (Temporal)  ~ Resp 16  ~ Ht 5' 6'' (1.676 m)  ~ Wt 190 lb (86.2 kg)  ~ SpO2 95%  ~ BMI 30.67 kg/m?    General: Alert and oriented, not in acute distress.  HEENT: Normocephalic, atraumatic. Sclerae are anicteric.   Neck: Supple, trachea midline, JVP flat  Respiratory: Clear to auscultation bilaterally, no wheezing or rales.   Cardiac: RRR, S1/S2 normal, no murmurs, rubs or gallops.   Abdomen: Soft, non-tender, non-distended, normoactive bowel sounds.  Extremities: No cyanosis, clubbing or edema.    Integument: Warm, no petechiae. No acute rash.  Neurologic/Psychiatric: Alert and oriented. Affect is appropriate.       OUTPATIENT MEDICATIONS:  Current Outpatient Medications   Medication Sig    ALLOPURINOL  100 mg tablet TAKE 2 TABLETS BY MOUTH EVERY DAY    amiodarone  200 mg tablet Take 1 tablet (200 mg total) by mouth daily.    amLODIPine  5 mg tablet Take 1 tablet (5 mg total) by mouth daily.    amoxicillin 500 mg capsule Use as directed prior to dental appointment    apixaban  5 mg tablet Take 1 tablet (5 mg total) by mouth two (2) times daily.    ascorbic acid  500 mg tablet Take 1 tablet (500 mg total) by mouth daily.    ATORVASTATIN  20 mg tablet TAKE 1 TABLET BY MOUTH EVERYDAY AT BEDTIME    ferrous gluconate  324 mg tablet Take 1 tablet (324 mg total) by mouth every other day.    losartan  50 mg tablet Take 1 tablet (50 mg total) by mouth two (2) times daily.    metoprolol  tartrate 50 mg tablet Take 1 tablet (50 mg total) by mouth two (2) times daily.    Multiple Vitamins-Minerals (HCA SUPER THERAVITE-M PO) Take 1 tablet by mouth daily.    omeprazole  20 mg DR capsule Take 1 capsule (20 mg total) by mouth two (2) times daily.    Oyster Shell (CALCIUM  CARBONATE 1250 MG, 500 MG ELEMENTAL,) 1250 mg tablet Take 1 tablet (1,250 mg total) by mouth daily with breakfast.    vitamin D, cholecalciferol , 25 mcg (1000 units) tablet Take 1 tablet (25 mcg total) by mouth daily.     No current facility-administered medications for this visit.       LABORATORY:  Lab Results   Component Value Date    WBC 5.20 01/13/2024    HGB 13.5 01/13/2024    HCT 44.9 01/13/2024    MCV 73.6 (L) 01/13/2024    PLT 182 01/13/2024      Lab Results   Component Value Date    CREAT 0.85 01/13/2024    BUN 19 01/13/2024    NA 143 01/13/2024    K 4.4 01/13/2024    CL 105 01/13/2024    CO2 29 01/13/2024      Lab Results   Component Value Date    HGBA1C 6.2 (H) 01/13/2024      Lab Results   Component Value Date    ALT 25 01/13/2024    AST 21 01/13/2024    ALKPHOS 92 01/13/2024    BILITOT 0.6 01/13/2024      Lab Results   Component Value Date    TSH 2.1 10/16/2023      Lab Results  Component Value Date    CALCIUM  9.4 01/13/2024    PHOS 4.1 09/23/2023      Lab Results   Component Value Date    CHOL 150 01/13/2024    CHOLHDL 46 01/13/2024    CHOLDLCAL 88 01/13/2024    CHOLDLQ 61 03/11/2012    TRIGLY 80 01/13/2024       Lab Results  (Last 360 days)        06/11 0158 05/05 1752 04/29 2146    Result             26                       52                       36                          DIAGNOSTIC STUDIES:  ECHO 04/20/2019:  1. Left ventricle is normal in size with mild hypertrophy. Left ventricular   function is preserved and visually estimated to be 60-65%. There are no wall   motion abnormalities.   2. Normal biatrial size.   3. Normal right ventricular size and systolic function.   4. Bioprosthetic valve (25-mm Carpentier-Edwards Magna Ease ; 1/13; Dr. Talitha)   is well seated in the aortic position. Peak velocity of 2.26 m/sec, mean   gradient of 11 mmHg, DI of 0.46, effective orifice area of 1.44 cm^2 (based   on LVOT diameter of 2cm)--consistent with normal prosthetic function.   5. Lack of an adequate TR signal precludes accurate estimation of right   ventricular systolic pressure. IVC diameter is = 2.1 cm with a > 50%   inspiratory collapse, suggestive of a right atrial pressure of 0-5 mmHg    ECHO 09/21/2020:   1. Normal LV size, mild septal hypertrophy, normal systolic function, normal LV diastolic function.   2. Left ventricular ejection fraction is approximately 60 to 65%.   3. Normal right ventricular size and normal systolic function.   4. A 25 mm Carpentier bioprosthetic valve is present in the aortic valve position. Echo findings are consistent with normal structure and function of the aortic prosthesis.   5. Normal pulmonary artery systolic pressure.   6. There are no prior echocardiograms available for comparison.    ECHO 09/06/2021:   1. Normal LV size, mild septal hypertrophy, normal wall motion and systolic function.   2. Left ventricular ejection fraction is approximately 60 to 65%.   3. Mild LV diastolic dysfunction (grade I, impaired relaxation).   4. Normal right ventricular size and grossly normal systolic function.   5. A 25 mm Carpentier bioprosthetic valve is present in the aortic valve position. It appears well seated. No paravalvular regurgitation. Peak aortic velocity is 1.9 m/sec and mean gradient is 8.0 mm Hg. By the continuity equation, the aortic valve area  is 1.74 cm? (Indexed AVA is 0.93 cm?/m?).   6. Upper normal aortic root (3.8 cm). Mildly enlarged mid ascending aorta (4.2 cm).   7. No significant valvular regurgitation.   8. Tricuspid regurgitant jet inadequate to assess pulmonary artery systolic pressure.   9. A prior echo performed on 09/21/2020 was reviewed for comparison. No significant changes noted since the previous study.    ECHO 12/29/2021:   1. Normal LV size, mild concentric left ventricular hypertrophy, normal systolic function, indeterminate diastolic function.  2. Left ventricular ejection fraction is approximately 60 to 65%.   3. Normal right ventricular size and normal systolic function.   4. Status post Carpentier bioprosthetic placement in the aortic valve position without regurgitation. Echo findings are consistent with normal structure and function of the aortic prosthesis. The aortic valve area is 1.39 cm? (Indexed AVA is 0.74 cm?/m?).   5. Trace tricuspid regurgitation and normal pulmonary artery systolic pressure.   6. A prior echo performed on 09/06/2021 was reviewed for comparison. The mean AV gradient has increased from 8 mmHg to 12 mmHg. Aortic valve was 1.74 cm2 in the previous study.    ECHO 09/07/2022  1. Normal LV systolic function. Normal left ventricular regional wall motion.  Left ventricular ejection fraction is approximately 60-65%.  2. Small LV size and moderate concentric left ventricular hypertrophy.  3. Mild LV diastolic dysfunction (grade I, impaired relaxation).  4. Not well seen right ventricular size and normal systolic function.  5. Normal atrial sizes.  6. A 25 mm Carpentier bioprosthetic valve is present in the aortic valve  position. Echo findings are consistent with normal structure and function of  the aortic prosthesis with mean gradient of 14 mmHg and AVA 1.26cm2.  7. Trace mitral valve regurgitation.  8. Trace to mild tricuspid valve regurgitation.  9. Tricuspid regurgitant jet inadequate to assess pulmonary artery systolic  pressure.  10. Normal mean pulmonary artery pressure by PVAT.  11. Mildly enlarged mid ascending aorta (4.1 cm).  12. A prior echo performed on 12/29/2021 was reviewed for comparison.  Carpentier valve measurements and findings as above similar to prior study.    ECHO 09/16/2023   1. Technically very difficult study with suboptimal views.   2. Small LV size and severe concentric left ventricular hypertrophy.   3. Hyperdynamic LV systolic function. Normal left ventricular regional wall motion. Left ventricular ejection fraction is approximately >75%.   4. Mild LV diastolic dysfunction (grade I, impaired relaxation).   5. Normal right ventricular size. Normal RV systolic function.   6. A 25 mm Carpentier bioprosthetic valve is present in the aortic valve position. Echo findings are consistent with normal structure and function of the aortic prosthesis.   7. A prior echo performed on 08/08/2023 was reviewed for comparison. No significant changes noted since the previous study.    CT chest 09/21/2020:  Postoperative changes of aortic valve replacement. Ectasia of the ascending aorta measures up to 3.9 cm in the proximal arch grossly unchanged since 2013    PYP scan 09/20/2023  No scintigraphic evidence of ATTR cardiac amyloidosis.     ECG 06/07/2020:  Normal sinus rhythm at ventricular rate of 74 beats per minute.  Mild ST elevation in inferior leads.  Described before.     []  Reviewed/ordered radiology  []  Independently interpreted studies    [x]  Reviewed/ordered labs  [x]  Reviewed & summarized old records    [x]  Reviewed/ordered diag med test   []  Decided to get outside medical records      []  Obtained history from someone other than patient    ASSESSMENT/PLAN:  1. Aortic valve replacement in 2013  Normal valve function on most recent echocardiogram in 09/2023. Repeat echo yearly.   We discussed that some degeneration of the bioprosthetic valve can be expected 10 years after surgery so he will need close follow-up with serial echocardiograms  Continue aspirin     2. Bicuspid aortic valve  We discussed that there is an increased risk of having aortic aneurysm in  patients with bicuspid aortic valve  Will need serial imaging. Most recent test in 08/2020. Stable from 2013. No change on echo in 09/2022 and 09/2023.   First-degree family members should be screened with echocardiograms    3. History of CVA  Residual leg weakness    4. Hyperlipidemia  On atorvastatin . LDL stable.     5. Abnormal EKG  Patient has ST elevations in the inferior leads which have been present before.  I reviewed the records.  No further testing is necessary in this regard.  He had 2 negative coronary angiograms    6. Hypertension  On metoprolol , losartan  and amlodipine . Increase losartan  to 75 mg daily. Take amlodipine  5 mg daily. Had better control on Coreg  in the past. Continue metoprolol  for rate control     7. Atrial fibrillation  8. Tachy brady syndrome  S/P PPM placement on 09/14/2023  Continue metoprolol   On anticoagulation with Eliquis   Follow up with Dr. Myrle    9. GI bleed  Found to have antral ulcerations on EGD. S/P multiple clips  Stable     Follow up visit in 3 months    Please note, I spent a total of 40 minutes for this visit including chart review, test interpretation, history taking, physical examination, counseling, coordination of care and documentation.    Plan of care was discussed with the patient and family in detail. All questions answered.    Halena Mohar, MD  Advanced Heart Failure and Transplantation Cardiology  Pager [phone]

## 2024-01-16 ENCOUNTER — Ambulatory Visit: Payer: Commercial Managed Care - Pharmacy Benefit Manager

## 2024-01-16 DIAGNOSIS — I480 Paroxysmal atrial fibrillation: Principal | ICD-10-CM

## 2024-01-16 DIAGNOSIS — I495 Sick sinus syndrome: Secondary | ICD-10-CM

## 2024-01-16 DIAGNOSIS — Z7901 Long term (current) use of anticoagulants: Secondary | ICD-10-CM

## 2024-01-16 DIAGNOSIS — Z950 Presence of cardiac pacemaker: Secondary | ICD-10-CM

## 2024-01-16 NOTE — Progress Notes
 SEE MEDIA SECTION FOR ACTUAL PRINTOUT     DATE OF SERVICE: 02/16/2024     Manufacturer: [employer]onic   Model: [place]wyn HEWS DR MRI [id number]  Serial #: [id number]  Implant Date: 09/14/2023  Indication: SSS     Battery Longevity: 13.1 years     Underlying rhythm: P < 40 bpm with intact AV conduction     Thresholds  P wave: 3.4 mV  R wave: 4.0 mV  RA lead impedance: 380 ohms  RV lead impedance: 437 ohms  RA capture voltage: 0.75 V @ 0.4 ms  RV capture voltage: 1.0 V @ 0.4 ms     Since 10/16/2023:  Afib/AFL/SVT episodes: None  VT/Vfib episodes: None  Ap= 97.2%; Vp=<0.1%     Settings: AAI <=> DDD 60 - 130 ppm   Upper Sensor Rate: 130 ppm      Changes made this session: None, temporarily reprogrammed for testing purposes only.     Impressions:   1. Normal pacemaker function.  2. Patient is pacemaker dependent at this time.     Plan:   1. Patient to see Dr. Myrle today printout. Please see separate EP note.  2. Remote transmission q3 months and arrhythmia management per Dr. Myrle.      I have read the device note and agree with the technologist note.     Eric Myrle, MD  Attending Physician, Cardiac Electrophysiology

## 2024-01-16 NOTE — Consults
 Cardiac Electrophysiology Out Patient Consultation  [facility]    Date/Time: 01/16/2024  Referring Physician: Jeana Francisco, MD    Patient: Andrew [name]  Date of Birth: [birth year]    Reason for Consultation:   EP Care    Chief complain:   Chief Complaint   Patient presents with    Follow-up     Problem List:   Andrew [name] is a 68 y.o. male who presents for initial EP Consult with history of:    - Atrial fibrillation, Paroxysmal, (Age between 83 and 62, HTN, and Stroke/TIA/thromboembolic event (2)) CHA2DS2VASc=4   - Anticoagulation: Apixaban   - Antiarrhythmic history:  - Current: Amiodarone   - Ablation / cardioversion history:  - None  - Pacemaker,dual chamber, Medtronic, implanted July 2025  - Remote monitoring: No  - Cardiac surgery; AVR 2013  - PUD; GI Bleeding   - June 2025 endoclips     History of Present Illness:   Andrew [name] is a 68 y.o. male who presents for initial EP Consult with me due to a history of an implantable cardiac device who is seen for EP device and device interrogation as well as GI bleeding and PAF among others here for follow up after recent (2 admissions) for syncope and GI bleeding. On Amiodarone  + OA.    Interval History 01/16/2024:  Andrew [name] is a 68 y.o. male is seen today in EP follow up visit for cardiac device management and in person device interrogation for atrial fibrillation PAF and contraindication for long term oral anticoagulation seen today with wife. Confirmed compliance with medical therapy. Negative for pre or syncope, chest pain, chest pressure, sob, pnd, palpitations, fast or irregular heart rates or any other pertinent ROS. Findings of device interrogation and management discussed in detail.      Past Medical History:   As above in addition to:    Past Medical History:   Diagnosis Date    Antral ulcer 09/2023    Atrial fibrillation (HCC/RAF)     CVA (cerebral vascular accident) (HCC/RAF) 2013    H/O aortic valve replacement History of blood transfusion     Hyperlipidemia     Hypertension     Kidney stone     Left-sided weakness     Pacemaker     Urinary frequency        Past Surgical History:     Past Surgical History:   Procedure Laterality Date    AORTIC VALVE REPLACEMENT      CARDIAC PACEMAKER PLACEMENT  09/2023    ESOPHAGOGASTRODUODENOSCOPY  09/2023     Family History:     Family History   Problem Relation Age of Onset    No Known Problems Mother     Other (gastric ulcer) Father     Uterine cancer Sister     Stroke Brother     Kidney failure Brother     Colon cancer Neg Hx     Lung cancer Neg Hx     Prostate cancer Neg Hx        Negative for sudden cardiac death, premature CAD    Social History:     Social History     Tobacco Use    Smoking status: Former     Current packs/day: 0.00     Average packs/day: 1 pack/day for 15.0 years (15.0 ttl pk-yrs)     Types: Cigarettes     Start date: 05/03/1975     Quit date: 05/02/1990  Years since quitting: 33.7    Smokeless tobacco: Never   Substance Use Topics    Alcohol use: Not Currently     Comment: occasionally has red wine    Drug use: Not Currently       Home Medications:     Outpatient Medications Prior to Visit   Medication Sig Dispense Refill    ALLOPURINOL  100 mg tablet TAKE 2 TABLETS BY MOUTH EVERY DAY 180 tablet 1    amiodarone  200 mg tablet Take 1 tablet (200 mg total) by mouth daily. 90 tablet 3    amLODIPine  5 mg tablet Take 1 tablet (5 mg total) by mouth daily. 90 tablet 3    amoxicillin 500 mg capsule Use as directed prior to dental appointment      apixaban  5 mg tablet Take 1 tablet (5 mg total) by mouth two (2) times daily. 180 tablet 3    ascorbic acid  500 mg tablet Take 1 tablet (500 mg total) by mouth daily.      ATORVASTATIN  20 mg tablet TAKE 1 TABLET BY MOUTH EVERYDAY AT BEDTIME 90 tablet 3    ferrous gluconate  324 mg tablet Take 1 tablet (324 mg total) by mouth every other day. 45 tablet 1    losartan  50 mg tablet Take 1 tablet (50 mg total) by mouth two (2) times daily. 30 tablet 0    metoprolol  tartrate 50 mg tablet Take 1 tablet (50 mg total) by mouth two (2) times daily. 180 tablet 3    Multiple Vitamins-Minerals (HCA SUPER THERAVITE-M PO) Take 1 tablet by mouth daily.      omeprazole  20 mg DR capsule Take 1 capsule (20 mg total) by mouth two (2) times daily. 60 capsule 3    Oyster Shell (CALCIUM  CARBONATE 1250 MG, 500 MG ELEMENTAL,) 1250 mg tablet Take 1 tablet (1,250 mg total) by mouth daily with breakfast. 120 tablet 2    vitamin D, cholecalciferol , 25 mcg (1000 units) tablet Take 1 tablet (25 mcg total) by mouth daily.       No facility-administered medications prior to visit.       Allergies:   No Known Allergies    Review of Systems:   A 14-system review of systems was obtained and unremarkable except as noted above.    Physical Exam:   Vitals: BP 114/79  ~ Pulse 57  ~ Temp 36.5 ?C (97.7 ?F) (Forehead)  ~ Resp 18  ~ Ht 5' 6'' (1.676 m)  ~ Wt 190 lb (86.2 kg)  ~ SpO2 98%  ~ BMI 30.67 kg/m?   General: Awake. No acute distress. Well-developed and well-nourished.  Eyes: Extraocular movements intact. No scleral icterus or conjunctival pallor.  ENMT: Oropharynx clear. Mucous membranes moist.  Neck: Supple. Trachea midline. No appreciable thyromegaly.  Lungs: Clear to auscultation bilaterally. No accessory muscle use.  Cardiovascular: No precordial lifts or thrills. Regular rate and rhythm with normal S1 and S2. No gallops, murmurs, or rubs appreciated. Carotid pulses 2+ bilaterally without bruits. Jugular venous pressure 7 cm. Radial, femoral, and dorsalis pedis pulses 2+ bilaterally. No lower extremity edema.  Abdomen: Soft, nontender, and nondistended. Normoactive bowel sounds. No hepatomegaly or fluid wave.  Skin: Warm, dry. No rash.  Neuro: Grossly nonfocal.    Laboratory Data:     Lab Results   Component Value Date    NA 143 01/13/2024    K 4.4 01/13/2024    CL 105 01/13/2024    CO2 29 01/13/2024    BUN  19 01/13/2024    CREAT 0.85 01/13/2024    CALCIUM  9.4 01/13/2024 MG 1.7 09/26/2023     Lab Results   Component Value Date    CHOL 150 01/13/2024    CHOLHDL 46 01/13/2024    CHOLDLCAL 88 01/13/2024    CHOLDLQ 61 03/11/2012    TRIGLY 80 01/13/2024    Lab Results   Component Value Date    WBC 5.20 01/13/2024    HGB 13.5 01/13/2024    HCT 44.9 01/13/2024    MCV 73.6 (L) 01/13/2024    PLT 182 01/13/2024    INR 0.9 09/26/2023    APTT 26.3 09/26/2023     Lab Results   Component Value Date    AST 21 01/13/2024    ALT 25 01/13/2024    ALKPHOS 92 01/13/2024    BILITOT 0.6 01/13/2024    ALBUMIN 4.5 01/13/2024     Lab Results   Component Value Date    BNP 26 09/11/2023    TSH 2.1 10/16/2023    HGBA1C 6.2 (H) 01/13/2024        Diagnostic/Imaging Studies:       Diagnostic/Imaging Studies: All below reviewed independently by me today     Electrocardiogram (all ECGs reviewed by me)  June 2025 - AP-VS rhythm     Cardiac Device Interrogation:  I have performed a full device interrogation today 01/16/2024  [x]  PPM []  ICD []  CRT-D []  CRT-P []  ILR    []   Abbott  [x]   Medtronic []  [employer] []  Biotronik   MRI Conditional []  YES []  NO    Time to ERI: 13..1  years  Device Dependent []  YES [x]  NO   []  Relatively   Mode: []  DDD [x]  DDDR  []  VVI []  VVIR    AP: 97 %; VP 1 %  AMS: []  YES [x]  NO    Remote monitor: [x]  YES []  NO    Permanent programmed changes made: None    Event Recorder (reviewed by me)  N/A    Echocardiogram (reviewed by me)  Results for orders placed during the hospital encounter of 08/08/23  Echo adult transthoracic complete 08/08/2023 08/13/2023  Impression  1. Normal left ventricular size, moderate concentric left ventricular hypertrophy and normal LV systolic function.  2. LV ejection fraction is approximately 60 to 65%.  3. Abnormal LV diastolic function (Grade I).  4. A 25 mm Carpentier bioprosthetic valve is present in the aortic position. Echo findings are consistent with normal structure and function of the aortic prosthesis with mean gradient of 11 mmHg.  5. The peak TR velocity is not well defined, and therefore, pulmonary artery systolic pressure cannot be calculated.  6. Mildly dilated proximal ascending aorta (41 mm).  7. Prior examinations are available and were reviewed for comparison purposes. Compared to prior study on 09/07/22, there are no significant changes.    EPS/RFA  N/A    Coronary Angiogram (reviewed by me)  N/A    Stress Test  No results found for this or any previous visit.  No results found for this or any previous visit.    Advanced Cardiac Imaging (reviewed by me)  No results found for this or any previous visit.  No results found for this or any previous visit.    Review of Data:  I have:   [x]  Reviewed/ordered []   1 []   2 [x]   >=  3 unique laboratory, radiology, and/or diagnostic tests as noted  [x]  Reviewed/ordered []   1 []   2 [x]   >=  3 prior external notes and incorporated into patient assessment  []  Discussed management or test interpretation with external provider(s) as noted:    Lab Studies Reviewed on this Encounter:  Reviewed Lab Comment   [x]   CBC    [x]   Basic Metabolic Panel    []   Lipid Panel    []  HgbA1c    []  Troponin    [x]   BNP    [x]   Other      Diagnostic Studies Reviewed/Ordered on this Encounter:  Reviewed Ordered Diagnostic Test   [x]   []   ECG   []   []   Stress ECG   []   []   Echo (TTE/TEE)   []   []   Stress TTE   []   []   MPS   []   []   Event/Holter   []   []   Catheterization   [x]   [x]   Cardiac Device Interrogation   []   []   EPS Ablation      Impression:   Andrew [name] is a 69 y.o. male with:    - Atrial fibrillation, Paroxysmal, (Age between 60 and 17, HTN, and Stroke/TIA/thromboembolic event (2)) CHA2DS2VASc=4   - Anticoagulation: Apixaban   - Antiarrhythmic history:  - Current: Amiodarone   - Ablation / cardioversion history:  - None  - Pacemaker,dual chamber, Medtronic, implanted July 2025  - Remote monitoring: No  - Cardiac surgery; AVR 2013  - PUD; GI Bleeding   - June 2025 endoclips    Discussion:     For Atrial Fibrillation    A detailed conversation which included what the implications of a diagnosis of atrial fibrillation and its management with ablation were discussed with the patient today and family member(s) if present during the visit.     The use of diagrams, visual aids, imaging, illustrations, patient literature were employed to aid in the understanding of the pathophysiology of the disease process and for enhanced understanding management/treatment/interventions and others aspects of care/management.      The management options presented centered around symptom based management (rhythm and/or rate control) and stroke risk reduction (medical and/or procedural). Options presented were medical antiarrythmic therapy vs electrophysiology study with ablation (radiofrequency, cryoablation, hybrid approach, dual energy usage, pulse field ablation) of atrial fibrillation.     The indication, benefit, alternatives, success rate and risk with the procedure were discussed in detail which included the need for redo procedures after an initial ablation were discussed in detail.  Complications include but are not limited arterio-venous fistula, hematoma, bleeding needing blood or blood product transfusion, vascular or nerve injury, infection cardiac perforation, tamponade, damage to the hearts native conductions system potentially rendering the patient pacemaker dependent, stroke, myocardial infarction, atrial esophageal fistula, phrenic nerve paralysis, pericarditis and death among others were discussed. Patient literature regarding the procedure including a copy of the informed consent was given to the patient, all questions were answered to their satisfaction.    For left atrial appendage exclusion with Watchman    A detailed conversation was held today with the patient and family members if were present regarding stroke risk reduction in the setting of atrial fibrillation/atrial flutter. Options discussed were medial therapy with oral anticoagulation and left atrial exclusion with Watchman device implant. The use of diagrams, visual aids, imaging, illustrations, patient literature were employed to aid in the understanding of the pathophysiology of the disease process and for enhanced understanding management/treatment/interventions and others aspects of care/management.      The indication, benefit, reasonable expectation, risk and alternatives  to Watchman implant were discussed and were reported to be fully understood with all questions answered.     Risk include but are not limited to vascular injury, bleeding with and without the need for blood transfusion, stroke, myocardial infarction, device dislodgement requiring emergency open heart surgery, incomplete occlusion of atrial appendage leading to continued oral anticoagulation and death (among others discussed).     A detailed conversation regarding oral anticoagulation and antiplatelet therapy in patient with Watchman device was discussed. In order to proceed one of the options would need to an option.     The indication for follow up imaging post implant with CT and/or TEE was also discussed.     Ideal post implant oral anticoagulation as bellow was discussed in detail.    A. ASA 81 mg + warfarin for INR 2-3 for 3 months followed by ASA 81-325 mg indefinitely as tolerated    B. ASA 81 mg + apixaban  5 mg bid for 3 months, followed by ASA 81-325 mg indefinitely as tolerated    C. ASA 81-325 mg + clopidogrel 75 mg for 6 months, followed by ASA 81-325 mg indefinitely         Based on a share decision making model, the patient elected to proceed with ablation of atrial fibrillation. The patient indicated he fully understood the risk and elected to proceed for time to consider his options.       Recommendations/Plan:      - Rhythm control: AAD; reduce to 100 mg per day  - Stroke risk reduction: OA   - Monitor for recurrent bleeding   - Discussed (again) LAAE   (GI bleeding)  - PPM check next visit 3 months  - Discussed concomitant procedure (AF ablation + LAAE)    Current plan of care was discussed in detail with the patient and all questions were answered.     Eric Melnick, MD  Attending Electrophysiologist  [facility]

## 2024-01-17 MED ORDER — OMEPRAZOLE 20 MG PO CPDR
20 mg | ORAL_CAPSULE | Freq: Two times a day (BID) | ORAL | 1 refills
Start: 2024-01-17 — End: ?

## 2024-01-27 ENCOUNTER — Ambulatory Visit: Payer: Commercial Managed Care - HMO

## 2024-01-29 ENCOUNTER — Other Ambulatory Visit: Payer: Commercial Managed Care - HMO

## 2024-02-03 ENCOUNTER — Telehealth: Payer: Commercial Managed Care - HMO

## 2024-02-03 ENCOUNTER — Other Ambulatory Visit: Payer: Commercial Managed Care - Pharmacy Benefit Manager

## 2024-02-03 NOTE — Telephone Encounter
 [facility] transferred the patient?s call after the wife stated that the patient requires assistance getting in and out of bed. She explained that the patient uses both a wheelchair and a walker and needs the bed to be set at a very low height. The wife requested that at least one member of the Lift Team be available to assist with transferring the patient in and out of bed.    She also mentioned that the patient will require a urinal. I advised her to inform the sleep technician once they are settled in their room for the night. I explained that the technicians are not able to perform nursing duties, and as the patient?s caregiver, she will be responsible for disposing of the urinal in the restroom prior to discharge.    The wife confirmed her understanding of our conversation.

## 2024-02-06 ENCOUNTER — Ambulatory Visit: Payer: Commercial Managed Care - Pharmacy Benefit Manager

## 2024-02-06 DIAGNOSIS — Z23 Encounter for immunization: Principal | ICD-10-CM

## 2024-02-06 DIAGNOSIS — I10 Essential (primary) hypertension: Secondary | ICD-10-CM

## 2024-02-06 DIAGNOSIS — D509 Iron deficiency anemia, unspecified: Secondary | ICD-10-CM

## 2024-02-06 DIAGNOSIS — M810 Age-related osteoporosis without current pathological fracture: Secondary | ICD-10-CM

## 2024-02-06 DIAGNOSIS — Z1211 Encounter for screening for malignant neoplasm of colon: Secondary | ICD-10-CM

## 2024-02-06 MED ORDER — OMEPRAZOLE 20 MG PO CPDR
20 mg | ORAL_CAPSULE | Freq: Two times a day (BID) | ORAL | 1 refills | 90.00000 days | Status: AC
Start: 2024-02-06 — End: ?

## 2024-02-06 NOTE — Patient Instructions
 A note from your doctor:    Thank you for choosing [facility] for your care.    Our plan, as discussed today:    Please INCREASE your dose of losartan  to 75mg  daily.   Please see your dentist and ask him for 'Clearance' to take a bisphosphonate medication for your osteoporosis.   Please call [facility] to schedule another bone density scan. I will contact you about the results.   Please START taking     Your return appointment should be scheduled for No follow-ups on file.    Follow-up: Checkout staff will schedule your follow-up visit.   If you prefer to schedule at a later time, you can call our Call Center at [phone] or request an appointment online through [facility].    ----------------------------------------------------------------------------------------------------------------------    Frequently asked questions:    1. How do I return for laboratory tests?  [facility], [address]):   Monday to Friday (6:00 am to 7:00 pm)   Weekends and holidays (7:00 am to 3:30 pm)  [facility] [phone] [address]):  Monday to Friday (8:00 am to 6:00 pm)  On request, we can send to outside labs (e.g. [facility] or [facility])  For other  labs, see: [email or web address]    2. How do I get my test results from the visit?  If you sign up for [facility] online ([email or web address]), we will release test results online with comments once your test results are available, usually within 1 week. Some specialized test results may take several weeks.  If you are not signed up for [facility] when your results become available, we will send your test results by letter within 1-2 weeks of your visit.    If another doctor orders a test for you, it is best to speak first with that doctor directly about the result.    3. How can I reach my doctor after the visit?  For non-emergency contact, you may reach your doctor two ways:  Online: send non-urgent medical questions through the [facility] website ([email or web address]). These are usually returned within 2 business days.  Call the [facility] Call Center at [phone] during business hours to leave a message (or schedule a return appointment).    4. How do I get after-hours care?    For serious and life-threatening concerns you should call 911.  Weekdays: Call during business hours to request an urgent appointment: [phone].   Evenings/weekends: Our colleagues offer access to after-hours care at our walk-in clinic:    [facility]  [address] [place] [address], [place] [address]   [phone]  Monday - Friday: 5:00pm to 9:00pm  Saturday - Sunday and Holidays: 9:00 am to 5:00 pm  [email or web address]    [facility] has additional urgent care sites, see locations at: [email or web address]

## 2024-02-06 NOTE — Progress Notes
 [facility] Visit    PATIENT:  Andrew [name] MRN:  [id number]   DOB:  08-11-1955 DATE:  02/06/2024     Chief Complaint   Patient presents with    Lab result     X3 month follow up. Would like to discuss    Flu Vaccine    Medication Refill     Pend    Hypertension     Subjective Andrew [name] is a 68 y.o. male here for a follow up visit.      PMHx AI s/p AVR, CVA 2013, Afib on apixaban , preDM, HTN, osteoporosis, gastric ulcer 09/2023 s/p EGD w hemoclip x2    Initial visit with me 01/25/22:  #Hx aortic valve replacement  - followed regularly by Dr. Di here. Per recent note:   He continues to do well from cardiovascular perspective. More specifically he denies of having any chest pain, shortness of breath, orthopnea or paroxysmal nocturnal dyspnea. Furthermore, he denies of having any palpitations, LE edema or lightheadedness.   Can walk a little bit a walker.   BP has been in the 100 range.        #Hx gout  - no recent episodes since 2013  - requests allopurinol  refill      #Hx stroke in 2013  - taking asa 81 daily and atorva. Reports adherence to all meds     #Elevated a1c pre-diabetes  - diet includes white rice, sometimes brown rice, breads, pasta, no soda, no candies  - red wine 4-7 drinks per week  - exercise limited by poor balance and LE weakness residual from stroke. Used to walk in park but not so much lately, only 1-2x per week  - declines further PT at this time, doing exercises on his own regularly every week, states commitment to increase frequency     #Hx low bone density on DEXA at OSH ~10 years ago after presenting for broken rib  - started on Vit D and Ca but did not have any follow up imaging. No falls or other fractures since then      [facility]  - amenable to flu vaccine today. Declines pcv, shingles, tdap, or covid vaccines today because he doesn't want to get multiple shots in one day. Amenable to receive these vaccines at pharmacy        Interval 07/20/22:   - DEXA 10/26 with osteoporosis of hip and L fem  - appt w cardiology 05/04/22: Cont ASA, atorva, coreg , amlo, losartan ; serial imaging for bicuspid aortic valve, EKG with stable STE  - No recent falls  - Fractures: None recent. In 2016 broke rib. Carrying heavy pot. No more rib pain. Was told it would heal by itself. Got DEXA at the time showing osteoporosis and was started on fosamax, but stopped after ~1 year for unclear reason. Pt believes they were told they don't need to be taking it anymore.   - Seen dentist earlier this month, was told he had cavities and he needs fillings. Planning to get filling in June after trip to [place] and will follow up here to start fosamax after that   #HCM   - Amenable to Pneumococcal vax and Shingrix today, as well as US  abd for aneurysm.   - Smoked previously but quit 25 years ago, started age 55.   #PreDM  - Eating brown rice, adjusting diet, trying to be more active. No soda, moderate sweets and fruit intake. Wants to revisit medical therapy at next visit but try  lifestyle mod for now   Trip to [place] soon. Does not need refills.        Interval 10/24/23:  - Hospitalized 08/2023 for tachy-brady syndrome, s/p PPM 09/14/23. Started on apix  - After discharge developed melena, readmitted 09/2023 and fth antral ulcerations on EGD, s/p hemoclips x2 on 09/17/2023. Discharged 6/21 and then readmitted for recurrent melena 6/22, repeat EGD s/p Ovesco clip in antrum and s/p 1u pRBC post-procedure. Discharged with stable hgb, no melena, restarted on home eliquis .   - Saw cardiologist 10/09/23 - cont yearly echo for AS s/p AVR 2013, cont coreg , amlo, losartan . Persistent inferior ST elevations.   - Saw GI 10/14/23 - continue PPI BID 6-8 weeks, repeat EGD pending  - Saw EP 10/16/23 - cont amio, apix. PPM check q60mo. 80% A-paced  - Today, reports feeling 'good', no acute complaints  - gets tired easily when walking - can only walk about 10 feet before getting tired, walks with walker, needs to stop due to SOB  - he has always walked with walker or used wheelchair since stroke in 2013. Thinks balance is worse recently, had fall May this year and fractured ankle. Gets home PT, which he is doing now as well as OT.   - now living at home with wife  - traveling to [place] next month, returning in September   - no alcohol use currently, no smoking currently. Quit smoking 25 years ago.   - pending sleep consult tomorrow      Interval 02/06/24:  - BP now improved at home to 120-130s SBP on amlo, losartan . Elevated in clinic today. Still taking losartan  50mg , has not started 75mg  dose yet.   - started taking iron pills after last visit, but stopped taking them now that hgb normalized.   - requests flu vaccine and refills today  - feeling well overall, no acute complaints today  - no bloody BM recently since hospitalization in June and EGD. No recent CP, SOB, palps. No f/c. No n/v/d. No recent falls, though has gait instability and is mostly wheelchair bound due to LE weakness residual from prior stroke in 2013, though can walk short distances with walker.       Review of Systems: Pertinent positives and negatives are included in HPI.    Other Physicians and Providers of Care:  Patient Care Team:  Andrew Francisco, MD as PCP - General (Internal Medicine)  Pcp, Unassigned Generic Andrew [name] as PCP - Plan Assigned  [name], Andrew Dire, RN as Ambulatory Nurse Care Manager  Andrew Sharyne DEL, LCSW as Ambulatory Social Work Clinical Advisor  Collapsible Section below includes relevant history and patient questionnaire responses.   (click to expand/collapse)    Meds     Current Outpatient Medications   Medication Instructions    allopurinol  (ZYLOPRIM ) 200 mg, Oral, Daily    amiodarone  200 mg, Oral, Daily    amLODIPine  (NORVASC ) 5 mg, Oral, Daily    amoxicillin 500 mg capsule Use as directed prior to dental appointment    apixaban  (ELIQUIS ) 5 mg, Oral, 2 times daily    ascorbic acid  500 mg, Daily    atorvastatin  (LIPITOR) 20 mg, Oral, Nightly calcium  carbonate 1250 mg (500 mg elemental) 1,250 mg, Oral, Daily with breakfast    losartan  (COZAAR ) 75 mg, Oral, Daily    metoprolol  tartrate (LOPRESSOR ) 50 mg, Oral, 2 times daily    Multiple Vitamins-Minerals (HCA SUPER THERAVITE-M PO) 1 tablet, Daily    omeprazole  (PRILOSEC) 20 mg, Oral, 2  times daily    vitamin D (cholecalciferol ) 25 mcg, Daily     Review of opioid use: Not applicable, not on opioids  Problem List     Patient Active Problem List   Diagnosis    Abnormality of gait    Hx of medication noncompliance    Muscle weakness (generalized)    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)    Hypertension    Gout    Seborrheic dermatitis    Urinary frequency    H/O aortic valve replacement    Bicuspid aortic valve    Age related osteoporosis    Prediabetes    H/O bicuspid aortic valve    H/O: CVA (cerebrovascular accident)    AF (paroxysmal atrial fibrillation) (HCC/RAF)    Tachy-brady syndrome (HCC/RAF)    Cardiac pacemaker in situ      Past Medical History     Past Medical History:   Diagnosis Date    Antral ulcer 09/2023    Atrial fibrillation (HCC/RAF)     CVA (cerebral vascular accident) (HCC/RAF) 2013    H/O aortic valve replacement     History of blood transfusion     Hyperlipidemia     Hypertension     Kidney stone     Left-sided weakness     Pacemaker     Urinary frequency      Past Surgical History     Past Surgical History:   Procedure Laterality Date    AORTIC VALVE REPLACEMENT      CARDIAC PACEMAKER PLACEMENT  09/2023    ESOPHAGOGASTRODUODENOSCOPY  09/2023        Family History     Family History   Problem Relation Age of Onset    No Known Problems Mother     Other (gastric ulcer) Father     Uterine cancer Sister     Stroke Brother     Kidney failure Brother     Colon cancer Neg Hx     Lung cancer Neg Hx     Prostate cancer Neg Hx      Social History - Link to Health Risk Assessment     Social History     Socioeconomic History    Marital status: Married   Tobacco Use    Smoking status: Former     Current packs/day: 0.00     Average packs/day: 1 pack/day for 15.0 years (15.0 ttl pk-yrs)     Types: Cigarettes     Start date: 05/03/1975     Quit date: 05/02/1990     Years since quitting: 33.7    Smokeless tobacco: Never   Substance and Sexual Activity    Alcohol use: Not Currently     Comment: occasionally has red wine    Drug use: Not Currently     Social Drivers of Health     Financial Resource Strain: Medium Risk (10/25/2023)    Financial Resource Strain     Difficulty of Paying Living Expenses: Somewhat hard          No data to display               Allergies   No Known Allergies  Health Maintenance     Health Maintenance   Topic Date Due    Shingles (Shingrix) Vaccine (1 of 2) Never done    Advance Directive  Never done    Colorectal Cancer Screening  07/16/2023    COVID-19 Vaccine(Tracks primary and booster doses, not sup/immunocomp) (4 -  2025-26 season) 12/02/2023    Preventive Wellness Visit  10/23/2024    Prediabetes Screening (See hover text)  01/13/2027    Tdap/Td Vaccine (3 - Td or Tdap) 07/19/2032    Hepatitis B Screening  Completed    Influenza Vaccine  Completed    Pneumococcal Vaccine  Completed    Hepatitis C Screening  Completed    AAA Screening  Completed    Statin prescribed for ASCVD Prevention or Treatment  Completed      Immunizations     Immunization History   Administered Date(s) Administered    COVID-19, mRNA, (Moderna) 100 mcg/0.5 mL 06/10/2019, 07/08/2019, 02/04/2020    Hepatitis A, Adult 09/29/2012    Influenza vaccine IM quadrivalent (Afluria Quad) (PF) SYR (28 years of age and older) 11/18/2015, 11/20/2016, 12/20/2017, 12/09/2019    Tdap 05/25/2011, 07/20/2022    influenza vaccine IM cell culture quadrivalent (Flucelvax QUAD) (PF) SYR (43 months of age and older) 02/11/2019    influenza vaccine IM quadrivalent (Fluzone Quad) MDV (28 months of age and older) 01/06/2015    influenza vaccine IM quadrivalent high dose (Fluzone High Dose Quad) (PF) SYR (48 years of age and older) 01/15/2021, 01/25/2022    influenza vaccine IM trivalent high dose (Fluzone High Dose) (PF) SYR (8 years of age and older) 12/15/2022, 02/06/2024    influenza, unspecified formulation 01/10/2008, 12/31/2009, 04/03/2011, 12/18/2011, 03/06/2014, 03/06/2014, 01/06/2015, 11/18/2015, 11/20/2016, 12/20/2017, 12/09/2019    pneumococcal conjugate vaccine 20-valent (Prevnar 20) 07/20/2022    pneumococcal polysaccharide vaccine 23-valent (Pneumovax) 05/25/2011        Objective BP 139/78  ~ Pulse 62  ~ Temp (!) 35.8 ?C (96.5 ?F) (Tympanic)  ~ Resp 18  ~ Ht 1.676 m (5' 6'')  ~ Wt 86.7 kg (191 lb 3.2 oz)  ~ SpO2 95%  ~ BMI 30.86 kg/m?  Body mass index is 30.86 kg/m?SABRA  Prior Vitals   Wt Readings from Last 3 Encounters:   02/06/24 86.7 kg (191 lb 3.2 oz)   01/16/24 86.2 kg (190 lb)   01/15/24 86.2 kg (190 lb)          BMI Readings from Last 1 Encounters:   10/16/23 30.67 kg/m?       System Check if Normal Positive or additional negative findings   Constit  [x]  General appearance     Eyes  [x]  Conj/Lids [x]  Pupils  []  Fundi     ENMT  [x]  External ears/nose []  Otoscopy   [x]  Hearing []  Nasal mucosa   [x]  Lips/teeth/gums []  Oropharynx     Neck  [x]  Inspection/palpation []  Thyroid     Resp  [x]  Normal effort [x]  No Wheezing    [x]  Auscultation  [x] No Crackles     CV  [x]  RRR   [x]  No  Murmurs  [x]  No Edema   [x]  JVP non-elevated    Normal pulses:   []  Abd aorta []  Femoral  []  Pedal     Breast  []  Inspection []  Palpation breast and axilla     GI  [x]  No abd masses    [x]  No tenderness   [x]  No Liver/spleen enlargement    []  Rectal-no masses     GU  M: []  Scrotum []  Penis []  Prostate   F:  []  External []  Bladder []  Cervix         []  Uterus    []  Adnexa      Lymph  []  Neck []  Axillae []  Groin     MS Specify  site examined:    [x]  Inspect/palp []  ROM   []  Stability []  Strength/tone         Skin  [x]  Inspection []  Palpation     Neuro  [x]  CN2-12 intact    [x]  Oriented x 3   []  Motor strength    []  Sensory intact to light touch   []  Patellar DTR  []  Brachial DTR          []  Normal Gait  L leg weakness 4/5 chronic on knee flexion, extension, ankle flexion and extension, hip flexion and extension.   B/l arm strength intact.   Unstable gait. Uses wheelchair   Psych  [x]  Insight/judgement     [x]  Mood/affect           Functional Ability/Safety Screening  Was the patient's timed UP&GO test unsteady or longer than 30 seconds? Yes  Results of hearing evaluation: Able to hear whisper or finger-rub at 6 feet. Yes  Collapsible Section below includes Behavioral Health Screenings, Labs, and Imaging    (click to expand/collapse)    Evaluation of Cognitive Function: Link to Complete Mini-Cog if Indicated  Mood/affect: Oriented to time, place, person  Judgement: intact     Behavioral Health Screen (PHQ-9 ~ GAD-7 ~ DAST-10 ~ Audit C Score Review)  PHQ-9 Total Score Total Score   10/24/2023 10     GAD-7 Total Score Total Score   10/25/2023 0   10/24/2023 12     DAST-10 Total Score Total Score   10/24/2023 0     Audit-C Total Score Total Score   10/24/2023 2     Labs Relevant labs reviewed.   Latest hbg increasing to 10.1 but still below prior baseline.   Last A1c 6.4 in 05/2023  Last lipid panel wnl.     Lab Studies:  CBC:         Results for orders placed or performed in visit on 10/19/23   CBC   Result Value Ref Range     White Blood Cell Count 4.42 4.16 - 9.95 x10E3/uL     Red Blood Cell Count 3.85 (L) 4.41 - 5.95 x10E6/uL     Hemoglobin 10.1 (L) 13.5 - 17.1 g/dL     Hematocrit 67.8 (L) 38.5 - 52.0 %     Mean Corpuscular Volume 83.4 79.3 - 98.6 fL     Mean Corpuscular Hemoglobin 26.2 (L) 26.4 - 33.4 pg     MCH Concentration 31.5 31.5 - 35.5 g/dL     Red Cell Distribution Width-SD 45.6 36.9 - 48.3 fL     Red Cell Distribution Width-CV 15.0 11.1 - 15.5 %     Platelet Count, Auto 240 143 - 398 x10E3/uL     Mean Platelet Volume 10.0 9.3 - 13.0 fL     Nucleated RBC%, automated 0.0 No Ref. Range %     Absolute Nucleated RBC Count 0.00 0.00 - 0.00 x10E3/uL Neutrophil Abs (Prelim) 2.72 See Absolute Neut Ct. x10E3/uL   Differential, Automated   Result Value Ref Range     Neutrophil Percent, Auto 61.5 No Ref. Range %     Lymphocyte Percent, Auto 24.0 No Ref. Range %     Monocyte Percent, Auto 8.4 No Ref. Range %     Eosinophil Percent, Auto 5.2 No Ref. Range %     Basophil Percent, Auto 0.7 No Ref. Range %     Immature Granulocytes% 0.2 No Reference Range %     Absolute Neut Count 2.72 1.80 -  6.90 x10E3/uL     Absolute Lymphocyte Count 1.06 (L) 1.30 - 3.40 x10E3/uL     Absolute Mono Count 0.37 0.20 - 0.80 x10E3/uL     Absolute Eos Count 0.23 0.00 - 0.50 x10E3/uL     Absolute Baso Count 0.03 0.00 - 0.10 x10E3/uL     Absolute Immature Gran Count 0.01 0.00 - 0.04 x10E3/uL   CBC & Auto Differential     Narrative     The following orders were created for panel order CBC & Auto Differential.  Procedure                               Abnormality         Status                     ---------                               -----------         ------                     RAR[[id number]]                          Abnormal            Final result               Differential, Automated[[id number]]      Abnormal            Final result                  Please view results for these tests on the individual orders.            Results for orders placed or performed during the hospital encounter of 09/22/23   CBC   Result Value Ref Range     White Blood Cell Count 6.29 4.16 - 9.95 x10E3/uL     Red Blood Cell Count 2.98 (L) 4.41 - 5.95 x10E6/uL     Hemoglobin 8.6 (L) 13.5 - 17.1 g/dL     Hematocrit 73.2 (L) 38.5 - 52.0 %     Mean Corpuscular Volume 89.6 79.3 - 98.6 fL     Mean Corpuscular Hemoglobin 28.9 26.4 - 33.4 pg     MCH Concentration 32.2 31.5 - 35.5 g/dL     Red Cell Distribution Width-SD 46.1 36.9 - 48.3 fL     Red Cell Distribution Width-CV 14.5 11.1 - 15.5 %     Platelet Count, Auto 241 143 - 398 x10E3/uL     Mean Platelet Volume 10.2 9.3 - 13.0 fL     Nucleated RBC%, automated 0.0 No Ref. Range %     Absolute Nucleated RBC Count 0.00 0.00 - 0.00 x10E3/uL      CMP:         Results for orders placed or performed during the hospital encounter of 09/09/23   Comprehensive Metabolic Panel   Result Value Ref Range     Sodium 141 135 - 146 mmol/L     Potassium 4.0 3.6 - 5.3 mmol/L     Chloride 105 96 - 106 mmol/L     Total CO2 25 20 - 30 mmol/L     Anion Gap 11 8 - 19 mmol/L  Glucose 101 (H) 65 - 99 mg/dL     Creatinine 9.27 9.39 - 1.30 mg/dL     Estimated GFR >10 See GFR Additional Information mL/min/1.65m2     GFR Additional Information See Comment       Urea Nitrogen 11 7 - 22 mg/dL     Calcium  8.5 (L) 8.6 - 10.4 mg/dL     Total Protein 6.5 6.1 - 8.2 g/dL     Albumin 3.5 (L) 3.9 - 5.0 g/dL     Bilirubin,Total 0.5 0.1 - 1.2 mg/dL     Alkaline Phosphatase 66 37 - 133 U/L     Aspartate Aminotransferase 22 13 - 62 U/L     Alanine Aminotransferase 38 8 - 70 U/L      Last LFTs:         Results for orders placed or performed during the hospital encounter of 09/22/23   Hepatic Funct Panel   Result Value Ref Range     Total Protein 5.4 (L) 6.1 - 8.2 g/dL     Albumin 3.2 (L) 3.9 - 5.0 g/dL     Bilirubin,Total 0.4 0.1 - 1.2 mg/dL     Bilirubin,Conjugated <0.2 <=0.3 mg/dL     Alkaline Phosphatase 47 37 - 133 U/L     Aspartate Aminotransferase 19 13 - 62 U/L     Alanine Aminotransferase 27 8 - 70 U/L      BMP:         Results for orders placed or performed in visit on 10/19/23   Basic Metabolic Panel   Result Value Ref Range     Sodium 143 135 - 146 mmol/L     Potassium 4.1 3.6 - 5.3 mmol/L     Chloride 106 96 - 106 mmol/L     Total CO2 24 20 - 30 mmol/L     Anion Gap 13 8 - 19 mmol/L     Glucose 104 (H) 65 - 99 mg/dL     Creatinine 9.11 9.39 - 1.30 mg/dL     Estimated GFR >10 See GFR Additional Information mL/min/1.72m2     GFR Additional Information See Comment       Urea Nitrogen 14 7 - 22 mg/dL     Calcium  9.3 8.6 - 10.4 mg/dL      Hgb J8R:         Lab Results   Component Value Date/Time     HGBA1C 6.4 (H) 05/25/2023 09:19 AM      Lipids:         Results for orders placed or performed in visit on 05/25/23   Lipid Panel   Result Value Ref Range     Cholesterol 123 See Comment mg/dL     Cholesterol,LDL,Calc 68 <100 mg/dL     Cholesterol, HDL 38 (L) >40 mg/dL     Triglycerides 84 <849 mg/dL     Non-HDL,Chol,Calc 85 <130 mg/dL      Vit-D:         Lab Results   Component Value Date/Time     [facility] 39 05/25/2023 09:19 AM       Imaging & Procedures     Radiology Relevant imaging reviewed.  Imaging Studies:   Last DXA:       Results for orders placed or performed in visit on 03/29/22   DXA lumbar spine+hip     Narrative     =================================================================  Bone Density Report                         =================================================================     Name:          KAIZER, DISSINGER  Patient ID:    [id number]  Age:           71  Sex:           Male  Ethnicity:     Asian  Date of Birth: 17-Nov-1955      -----------------------------------------------------------------     Indication: Prior fracture;     Referring Provider: MECHELE, Andrew P.     Study: Bone densitometry was performed.     Exam Date: March 29, 2022     Accession number: [id number]        Bone Density:  -----------------------------------------------------------------  Region                   BMD    T-score  Z-score   Classification  -----------------------------------------------------------------  AP Spine(L1-L4)          0.884   -1.9     -1.1       Osteopenia  Femoral Neck (Left)      0.548   -2.8     -1.7       Osteoporosis  Total Hip (Left)         0.654   -2.5     -2.0       Osteoporosis  -----------------------------------------------------------------     World Health Organization criteria for BMD impression   classify patients as:   Normal (T-score at or above -1.0),   Osteopenia (T-score between -1.0 and -2.5), or   Osteoporosis (T-score at or below -2.5).      10-year Fracture Risk:  -----------------------------------------------------------------  FRAX not reported because:    Some T-score for Spine Total or Hip Total or Femoral Neck at   or below -2.5     Prior hip or vertebral fracture  -----------------------------------------------------------------           Impression: The patient has established osteoporosis, based on   the Left Femoral Neck T-score and the existence of a prior   fracture. The patient has risk factors, including: previous   fracture.         Reported by: Andrew [name] on 03/29/2022 4:37:00 PM.     Last CTA abd 09/22/23  1.  No evidence of mesenteric ischemia and no definite active contrast extravasation. Minimal high density material in the gastric antrum only seen on arterial phase imaging which does not persist on venous phase likely redistribution of existing hyperdense material in the gastric lumen. No acute CT abnormality in the abdomen or pelvis.  2.  Uncomplicated cholelithiasis.    Last TTE  CONCLUSIONS   1. Technically very difficult study with suboptimal views.   2. Small LV size and severe concentric left ventricular hypertrophy.   3. Hyperdynamic LV systolic function. Normal left ventricular regional wall motion. Left ventricular ejection fraction is approximately >75%.   4. Mild LV diastolic dysfunction (grade I, impaired relaxation).   5. Normal right ventricular size. Normal RV systolic function.   6. A 25 mm Carpentier bioprosthetic valve is present in the aortic valve position. Echo findings are consistent with normal structure and function of the aortic prosthesis.   7. A prior echo performed on 08/08/2023 was reviewed for comparison. No significant changes noted since the previous study.  Assessment & Plan    1. Need for immunization against influenza  Influenza vaccine IM; high dose; PF (Fluzone HD) (age 81 years and older)      2. Primary hypertension  amLODIPine  5 mg tablet    losartan  50 mg tablet      3. Iron deficiency anemia, unspecified iron deficiency anemia type        4. Age-related osteoporosis without current pathological fracture  DXA lumbar spine+hip      5. Screening for colon cancer  Fecal Immunochemical Test  (For colorectal cancer screening)        Andrew [name] is a 68 y.o. male here for Subsequent Annual Wellness Visit and review of chronic medical conditions.      Diagnoses and all orders for this visit:    Need for immunization against influenza  -     Influenza vaccine IM; high dose; PF (Fluzone HD) (age 61 years and older)    Primary hypertension  -     amLODIPine  5 mg tablet; Take 1 tablet (5 mg total) by mouth daily.  -     losartan  50 mg tablet; Take 1.5 tablets (75 mg total) by mouth daily.    Age-related osteoporosis without current pathological fracture  On DXA in 2023. Planning to start bisphosphonate pending dental clearance. Pt instructed to obtain dental clearance today.   -     DXA lumbar spine+hip; Future; Expected date: 02/06/2024    Screening for colon cancer  -     Fecal Immunochemical Test  (For colorectal cancer screening); Future    Afib        -     follows with EP. Pending possible AF ablation + LAAE. PPM checks q 3 months.         -     apixaban  5mg  BID        -     amiodarone  100mg  (reduced from 200mg  01/16/24)       Prior/chronic:    Gait abnormality  Imbalance, LE weakness due to old stroke  Cerebrovascular accident (CVA), unspecified mechanism (HCC/RAF)  -     Referral to Rehabilitation, Occupational Therapy  -     [facility]erral to [facility], Physical Therapy    Anemia, normocytic  Upper GI bleed  S/p oral iron repletion, with improved hgb as of 01/2024. Off oral iron now. Will reassess hgb at next visit.     Prediabetes        -     f/u future a1c    Idiopathic gout, unspecified chronicity, unspecified site  Gout involving toe, unspecified cause, unspecified chronicity, unspecified laterality  -     Uric Acid; Future. Last level at goal  -     Cont allopurinol       Advice/Referrals: Follow up in 6 months.       The additional diagnoses assessed at this visit constituted a separate, identifiable service from the routine preventive health examination.    The above plan of care, diagnosis, orders, and follow-up were discussed with the patient.  Questions related to this recommended plan of care were answered.    Discussed with attending physician, Dr. Glennon, who agrees with the assessment and plan above.    Andrew Ewing, MD 02/06/2024 5:09 PMAnthony [name] has no Advance Directive or POLST in [facility] and has the capacity to identify a surrogate decision maker. Next steps in advance care planning-: I gave patient an advance directive to record Health Care Agent and  begin advance care planning.

## 2024-02-06 NOTE — Nursing Clinical Note
 DOCUMENTATION FOR FIT TEST KIT    Fecal Occult Blood Immunoassay (FIT) test kit was provided to the patient by Ozell Curtistine Sheen, LVN at 5:07 PM  ADT label was placed on the specimen tube.  Instructed the patient to write the collection date and time on the printed FIT test laboratory requisition form and specimen tube.  Explained the collection and mailing process to the patient.   Patient verbalized understanding.  FIT kit expiration: 04/17/2024.

## 2024-02-07 MED ORDER — LOSARTAN POTASSIUM 50 MG PO TABS
75 mg | ORAL_TABLET | Freq: Every day | ORAL | 3 refills | 90.00000 days | Status: AC
Start: 2024-02-07 — End: ?

## 2024-02-07 MED ORDER — AMLODIPINE BESYLATE 5 MG PO TABS
5 mg | ORAL_TABLET | Freq: Every day | ORAL | 3 refills | 90.00000 days | Status: AC
Start: 2024-02-07 — End: ?

## 2024-02-07 NOTE — Progress Notes
I saw the patient with Dr. Cathlean Cower and agree with his history, examination, and findings. We developed the plan detailed in this note. The above plan of care, diagnosis, order, and follow-up were discussed with the patient. Questions related to this recommended plan of care were answered.

## 2024-02-13 ENCOUNTER — Telehealth: Payer: PRIVATE HEALTH INSURANCE

## 2024-02-13 DIAGNOSIS — I10 Essential (primary) hypertension: Secondary | ICD-10-CM

## 2024-02-13 MED ORDER — LOSARTAN POTASSIUM 50 MG PO TABS
75 mg | ORAL_TABLET | Freq: Every day | ORAL | 3 refills
Start: 2024-02-13 — End: ?

## 2024-02-13 NOTE — Telephone Encounter
 New Rx Request      Last seen by MD: 01/15/24    Reason for the request: Patient?s wife Lanny advised that Dr. Di had recommended changing his losartan  dosage to 75 mg daily and she is requesting a new prescription. Thank you!    [facility] [address] [place], [place] - [address]  [address] [place] [address]  Phone: [phone] Fax: [phone]  Hours: Not open 24 hours      Any Symptoms:  []  Yes  [x]  No      If yes, what symptoms are you experiencing:    Duration of symptoms (how long):    Have you taken medication for symptoms (OTC or Rx):      Is a particular medication being requested? Losartan  75 mg     Was an appointment offered? No     Patient or caller has been notified of the turnaround time of 1-2 business day(s).

## 2024-02-15 ENCOUNTER — Ambulatory Visit: Payer: PRIVATE HEALTH INSURANCE

## 2024-02-20 MED ORDER — ALLOPURINOL 100 MG PO TABS
200 mg | ORAL_TABLET | Freq: Every day | ORAL | 1 refills
Start: 2024-02-20 — End: ?

## 2024-02-23 MED ORDER — ALLOPURINOL 100 MG PO TABS
200 mg | ORAL_TABLET | Freq: Every day | ORAL | 1 refills
Start: 2024-02-23 — End: ?

## 2024-02-25 ENCOUNTER — Other Ambulatory Visit: Payer: PRIVATE HEALTH INSURANCE

## 2024-03-04 ENCOUNTER — Telehealth: Payer: PRIVATE HEALTH INSURANCE

## 2024-03-04 NOTE — Telephone Encounter
 Call Back Request      Reason for call back: Dick with [facility] services called to ask if office had received 3 orders on 10/28, 10/31, 11/05. These are for PT re evaul and missed appt  Cb is [phone]  Any Symptoms:  []  Yes  [x]  No      If yes, what symptoms are you experiencing:    Duration of symptoms (how long):    Have you taken medication for symptoms (OTC or Rx):      If call was taken outside of clinic hours:    [] Patient or caller has been notified that this message was sent outside of normal clinic hours.     [] Patient or caller has been warm transferred to the physician's answering service. If applicable, patient or caller informed to please call us  back if symptoms progress.  Patient or caller has been notified of the turnaround time of 1-2 business day(s).  Thank you

## 2024-03-05 NOTE — Telephone Encounter
 Called and spoke with Damien from [facility] [phone]. The home health agency will be faxing HH orders from 10/28 and 10/31 since those were not received. The [facility] orders for 11/05 were signed and faxed to the home health agency.

## 2024-04-08 ENCOUNTER — Telehealth: Payer: Commercial Managed Care - Pharmacy Benefit Manager

## 2024-04-08 NOTE — Telephone Encounter
 Called and spoke with representative from [facility] phone: [phone] fax: [phone]. The [facility] is aware that patient's PCP is not PECOS certified, however, the last attending who saw the patient is PECOS certified and will be signing on faxes that have been received. Once signed, will fax back orders. Call ended.

## 2024-04-08 NOTE — Telephone Encounter
 Call Back Request      Reason for call back: Dick with [facility] called to check on order faxed last week.   Orders are from 12/01-01/02    Any Symptoms:  []  Yes  [x]  No      If yes, what symptoms are you experiencing:    Duration of symptoms (how long):    Have you taken medication for symptoms (OTC or Rx):      If call was taken outside of clinic hours:    [] Patient or caller has been notified that this message was sent outside of normal clinic hours.     [] Patient or caller has been warm transferred to the physician's answering service. If applicable, patient or caller informed to please call us  back if symptoms progress.  Patient or caller has been notified of the turnaround time of 1-2 business day(s).  Thank you

## 2024-04-10 NOTE — Telephone Encounter
 Signed orders faxed to [facility] on 04/09/24

## 2024-04-23 ENCOUNTER — Ambulatory Visit: Payer: Commercial Managed Care - HMO

## 2024-04-23 ENCOUNTER — Ambulatory Visit: Payer: PRIVATE HEALTH INSURANCE

## 2024-04-23 DIAGNOSIS — I495 Sick sinus syndrome: Principal | ICD-10-CM

## 2024-04-23 DIAGNOSIS — Z45018 Encounter for adjustment and management of other part of cardiac pacemaker: Secondary | ICD-10-CM

## 2024-04-23 DIAGNOSIS — Z950 Presence of cardiac pacemaker: Secondary | ICD-10-CM

## 2024-04-23 DIAGNOSIS — I480 Paroxysmal atrial fibrillation: Secondary | ICD-10-CM

## 2024-04-23 DIAGNOSIS — Z7901 Long term (current) use of anticoagulants: Principal | ICD-10-CM

## 2024-04-23 NOTE — Progress Notes
 SEE MEDIA SECTION FOR ACTUAL PRINTOUT     DATE OF SERVICE: 04/23/2024     Manufacturer: Medtronic   Model: Nelwyn HEWS DR MRI [id number]  Serial #: [id number]  Implant Date: 09/14/2023  Indication: SSS     Battery Longevity: 12.8 years     Underlying rhythm: P < 40 bpm with intact AV conduction     Thresholds  P wave: 3.9 mV  R wave: 2.3 mV  RA lead impedance: 380 ohms  RV lead impedance: 399 ohms  RA capture voltage: 0.75 V @ 0.4 ms  RV capture voltage: 1.25 V @ 0.4 ms     Since 01/16/2024:  Afib/AFL/SVT episodes: None  VT/Vfib episodes: None  Ap= 99%; Vp=<0.1%     Settings: AAIR <=> DDDR 60 - 130 ppm   Upper Sensor Rate: 130 ppm      Changes made this session: None, temporarily reprogrammed for testing purposes only.     Impressions:   1. Normal pacemaker function.  2. Patient is pacemaker dependent at this time.     Plan:   1. Patient to see Dr. Myrle today printout. Please see separate EP note.  2. Remote transmission q3 months and arrhythmia management per Dr. Myrle.

## 2024-04-23 NOTE — Consults
 Cardiac Electrophysiology Out Patient Consultation  [facility]    Date/Time: 04/23/2024  Referring Physician: Jeana Francisco, MD    Patient: Andrew [name]  Date of Birth: 07-10-1955    Reason for Consultation:     Active Problems Include:  1. On continuous oral anticoagulation    2. PAF (paroxysmal atrial fibrillation) (HCC/RAF)    3. Pacemaker reprogramming/check      Problem List:   Andrew [name] is a 69 y.o. male who presents for initial EP Consult with history of:    - Atrial fibrillation, Paroxysmal, (Age between 53 and 78, HTN, and Stroke/TIA/thromboembolic event (2)) CHA2DS2VASc=4   - Anticoagulation: Apixaban   - Antiarrhythmic history:  - Current: Amiodarone   - Ablation / cardioversion history:  - None  - Pacemaker,dual chamber, Medtronic, implanted July 2025  - Remote monitoring: No  - Cardiac surgery; AVR 2013  - PUD; GI Bleeding   - June 2025 endoclips     History of Present Illness:   Andrew [name] is a 69 y.o. male who presents for initial EP Consult with me due to a history of an implantable cardiac device who is seen for EP device and device interrogation as well as GI bleeding and PAF among others here for follow up after recent (2 admissions) for syncope and GI bleeding. On Amiodarone  + OA.    Interval History 01/16/2024:  Andrew [name] is a 69 y.o. male is seen today in EP follow up visit for cardiac device management and in person device interrogation for atrial fibrillation PAF and contraindication for long term oral anticoagulation seen today with wife. Confirmed compliance with medical therapy. Negative for pre or syncope, chest pain, chest pressure, sob, pnd, palpitations, fast or irregular heart rates or any other pertinent ROS. Findings of device interrogation and management discussed in detail    Interval History 04/23/2024:  Andrew [name] is a 70 y.o. male is seen today in EP follow up visit for cardiac device management and in person device interrogation for atrial fibrillation PAF and contraindication for long term oral anticoagulation seen today with wife. Confirmed compliance with medical therapy. Negative for pre or syncope, chest pain, chest pressure, sob, pnd, palpitations, fast or irregular heart rates or any other pertinent ROS. Findings of device interrogation and management discussed in detail.  He prefers medical over procedural management for now.     Past Medical History:   As above in addition to:    Past Medical History:   Diagnosis Date    Antral ulcer 09/2023    Atrial fibrillation (HCC/RAF)     CVA (cerebral vascular accident) (HCC/RAF) 2013    H/O aortic valve replacement     History of blood transfusion     Hyperlipidemia     Hypertension     Kidney stone     Left-sided weakness     Pacemaker     Urinary frequency        Past Surgical History:     Past Surgical History:   Procedure Laterality Date    AORTIC VALVE REPLACEMENT      CARDIAC PACEMAKER PLACEMENT  09/2023    ESOPHAGOGASTRODUODENOSCOPY  09/2023     Family History:     Family History   Problem Relation Age of Onset    No Known Problems Mother     Other (gastric ulcer) Father     Uterine cancer Sister     Stroke Brother     Kidney failure Brother     Colon  cancer Neg Hx     Lung cancer Neg Hx     Prostate cancer Neg Hx        Negative for sudden cardiac death, premature CAD    Social History:     Social History     Tobacco Use    Smoking status: Former     Current packs/day: 0.00     Average packs/day: 1 pack/day for 15.0 years (15.0 ttl pk-yrs)     Types: Cigarettes     Start date: 05/03/1975     Quit date: 05/02/1990     Years since quitting: 34.0    Smokeless tobacco: Never   Substance Use Topics    Alcohol use: Not Currently     Comment: occasionally has red wine    Drug use: Not Currently       Home Medications:     Outpatient Medications Prior to Visit   Medication Sig Dispense Refill    ALLOPURINOL  100 mg tablet TAKE 2 TABLETS BY MOUTH EVERY DAY 180 tablet 1    amiodarone  200 mg tablet Take 1 tablet (200 mg total) by mouth daily. 90 tablet 3    amLODIPine  5 mg tablet Take 1 tablet (5 mg total) by mouth daily. 90 tablet 3    apixaban  5 mg tablet Take 1 tablet (5 mg total) by mouth two (2) times daily. 180 tablet 3    ascorbic acid  500 mg tablet Take 1 tablet (500 mg total) by mouth daily.      ATORVASTATIN  20 mg tablet TAKE 1 TABLET BY MOUTH EVERYDAY AT BEDTIME 90 tablet 3    losartan  50 mg tablet Take 1.5 tablets (75 mg total) by mouth daily. 45 tablet 3    metoprolol  tartrate 50 mg tablet Take 1 tablet (50 mg total) by mouth two (2) times daily. 180 tablet 3    Multiple Vitamins-Minerals (HCA SUPER THERAVITE-M PO) Take 1 tablet by mouth daily.      OMEPRAZOLE  20 mg DR capsule TAKE 1 CAPSULE BY MOUTH TWO TIMES DAILY. (Patient taking differently: Take 1 capsule (20 mg total) by mouth daily.) 180 capsule 1    Oyster Shell (CALCIUM  CARBONATE 1250 MG, 500 MG ELEMENTAL,) 1250 mg tablet Take 1 tablet (1,250 mg total) by mouth daily with breakfast. 120 tablet 2    vitamin D, cholecalciferol , 25 mcg (1000 units) tablet Take 1 tablet (25 mcg total) by mouth daily.      amoxicillin 500 mg capsule Use as directed prior to dental appointment (Patient not taking: Reported on 04/23/2024)       No facility-administered medications prior to visit.       Allergies:   No Known Allergies    Review of Systems:   A 14-system review of systems was obtained and unremarkable except as noted above.    Physical Exam:   Vitals: BP 111/74  ~ Pulse 65  ~ Temp 36.7 ?C (98 ?F) (Temporal)  ~ Resp 16  ~ Ht 5' 6'' (1.676 m)  ~ Wt 191 lb (86.6 kg)  ~ SpO2 93%  ~ BMI 30.83 kg/m?   General: Awake. No acute distress. Well-developed and well-nourished.  Eyes: Extraocular movements intact. No scleral icterus or conjunctival pallor.  ENMT: Oropharynx clear. Mucous membranes moist.  Neck: Supple. Trachea midline. No appreciable thyromegaly.  Lungs: Clear to auscultation bilaterally. No accessory muscle use.  Cardiovascular: No precordial lifts or thrills. Regular rate and rhythm with normal S1 and S2. No gallops, murmurs, or rubs appreciated. Carotid  pulses 2+ bilaterally without bruits. Jugular venous pressure 7 cm. Radial, femoral, and dorsalis pedis pulses 2+ bilaterally. No lower extremity edema.  Abdomen: Soft, nontender, and nondistended. Normoactive bowel sounds. No hepatomegaly or fluid wave.  Skin: Warm, dry. No rash.  Neuro: Grossly nonfocal.    Laboratory Data:     Lab Results   Component Value Date    NA 143 01/13/2024    K 4.4 01/13/2024    CL 105 01/13/2024    CO2 29 01/13/2024    BUN 19 01/13/2024    CREAT 0.85 01/13/2024    CALCIUM  9.4 01/13/2024    MG 1.7 09/26/2023     Lab Results   Component Value Date    CHOL 150 01/13/2024    CHOLHDL 46 01/13/2024    CHOLDLCAL 88 01/13/2024    CHOLDLQ 61 03/11/2012    TRIGLY 80 01/13/2024    Lab Results   Component Value Date    WBC 5.20 01/13/2024    HGB 13.5 01/13/2024    HCT 44.9 01/13/2024    MCV 73.6 (L) 01/13/2024    PLT 182 01/13/2024    INR 0.9 09/26/2023    APTT 26.3 09/26/2023     Lab Results   Component Value Date    AST 21 01/13/2024    ALT 25 01/13/2024    ALKPHOS 92 01/13/2024    BILITOT 0.6 01/13/2024    ALBUMIN 4.5 01/13/2024     Lab Results   Component Value Date    BNP 26 09/11/2023    TSH 2.1 10/16/2023    HGBA1C 6.2 (H) 01/13/2024        Diagnostic/Imaging Studies:       Diagnostic/Imaging Studies: All below reviewed independently by me today     Electrocardiogram (all ECGs reviewed by me)  June 2025 - AP-VS rhythm     Cardiac Device Interrogation:  I have performed a full device interrogation today 04/23/2024  [x]  PPM []  ICD []  CRT-D []  CRT-P []  ILR    []   Abbott  [x]   Medtronic []  [employer] []  Biotronik   MRI Conditional []  YES []  NO    Time to ERI: 12.8 years  Device Dependent []  YES [x]  NO   []  Relatively   Mode: []  DDD [x]  DDDR  []  VVI []  VVIR    AP: 99 %; VP 1 %  AMS: []  YES [x]  NO    Remote monitor: [x]  YES []  NO    Permanent programmed changes made: None    Event Recorder (reviewed by me)  N/A    Echocardiogram (reviewed by me)  Results for orders placed during the hospital encounter of 08/08/23  Echo adult transthoracic complete 08/08/2023 08/13/2023  Impression  1. Normal left ventricular size, moderate concentric left ventricular hypertrophy and normal LV systolic function.  2. LV ejection fraction is approximately 60 to 65%.  3. Abnormal LV diastolic function (Grade I).  4. A 25 mm Carpentier bioprosthetic valve is present in the aortic position. Echo findings are consistent with normal structure and function of the aortic prosthesis with mean gradient of 11 mmHg.  5. The peak TR velocity is not well defined, and therefore, pulmonary artery systolic pressure cannot be calculated.  6. Mildly dilated proximal ascending aorta (41 mm).  7. Prior examinations are available and were reviewed for comparison purposes. Compared to prior study on 09/07/22, there are no significant changes.    EPS/RFA  N/A    Coronary Angiogram (reviewed by me)  N/A  Stress Test  No results found for this or any previous visit.  No results found for this or any previous visit.    Advanced Cardiac Imaging (reviewed by me)  No results found for this or any previous visit.  No results found for this or any previous visit.    Review of Data:  I have:   [x]  Reviewed/ordered []   1 []   2 [x]   >=  3 unique laboratory, radiology, and/or diagnostic tests as noted  [x]  Reviewed/ordered []   1 []   2 [x]   >=  3 prior external notes and incorporated into patient assessment  []  Discussed management or test interpretation with external provider(s) as noted:    Lab Studies Reviewed on this Encounter:  Reviewed Lab Comment   [x]   CBC    [x]   Basic Metabolic Panel    []   Lipid Panel    []  HgbA1c    []  Troponin    [x]   BNP    [x]   Other      Diagnostic Studies Reviewed/Ordered on this Encounter:  Reviewed Ordered Diagnostic Test   [x]   []   ECG   []   []   Stress ECG   []   []   Echo (TTE/TEE)   []   []   Stress TTE   []   []   MPS   []   []   Event/Holter   []   []   Catheterization   [x]   [x]   Cardiac Device Interrogation   []   []   EPS Ablation        Medical Decision Making:       Level Number and Complexity of Problems Amount and/or complexity of Data to be Analyzed Risk of Complications and/or Morbidity   []  4 []   1 chronic with exacerbation, progression, or side effect of treatment    Or    [x]   2 stable chronic    Or    []   1 acute with systemic symptoms or complicated injury   1/3 categories:    []  Category 1 (Combination of any 3: results of unique tests, ordering unique tests, assessment requiring indep historian, review external notes)    []  Category 2 (Independent interpretation of test)    []  Category 3 (Discussion with external physician) Moderate  risk:    []  Rx drug management  []  Decision for minor procedure or surgery with RFs  []  Major elective surgery w/o RFs  []  Dx or Rx limited by SDOH   []  5 []   1 chronic w/ severe exacerbation, progression, or side effect of treatment    Or    []   1 acute or chronic illness or injury that poses threat to life or bodily function 2/3 categories:    [x]  Category 1 (Combination of any 3: results of unique tests, ordering unique tests, assessment requiring indep historian, review external notes)    [x]  Category 2 (Independent interpretation of test)    []  Category 3 (Discussion with external physician) High risk:    [x]  Drug treatment requiring intensive monitoring  []  Decision for elective major surgery with RFs  []  Emergency major surgery  []  Need for hospitalization  []  DNR/de-escalation of care       Impression:   Andrew [name] is a 69 y.o. male with:    - Atrial fibrillation, Paroxysmal, (Age between 66 and 58, HTN, and Stroke/TIA/thromboembolic event (2)) CHA2DS2VASc=4   - Anticoagulation: Apixaban   - Antiarrhythmic history:  - Current: Amiodarone   - Ablation / cardioversion history:  - None  -  Pacemaker,dual chamber, Medtronic, implanted July 2025  - Remote monitoring: No  - Cardiac surgery; AVR 2013  - PUD; GI Bleeding   - June 2025 endoclips    Discussion:     For Atrial Fibrillation    A detailed conversation which included what the implications of a diagnosis of atrial fibrillation and its management with ablation were discussed with the patient today and family member(s) if present during the visit.     The use of diagrams, visual aids, imaging, illustrations, patient literature were employed to aid in the understanding of the pathophysiology of the disease process and for enhanced understanding management/treatment/interventions and others aspects of care/management.      The management options presented centered around symptom based management (rhythm and/or rate control) and stroke risk reduction (medical and/or procedural). Options presented were medical antiarrythmic therapy vs electrophysiology study with ablation (radiofrequency, cryoablation, hybrid approach, dual energy usage, pulse field ablation) of atrial fibrillation.     The indication, benefit, alternatives, success rate and risk with the procedure were discussed in detail which included the need for redo procedures after an initial ablation were discussed in detail.  Complications include but are not limited arterio-venous fistula, hematoma, bleeding needing blood or blood product transfusion, vascular or nerve injury, infection cardiac perforation, tamponade, damage to the hearts native conductions system potentially rendering the patient pacemaker dependent, stroke, myocardial infarction, atrial esophageal fistula, phrenic nerve paralysis, pericarditis and death among others were discussed. Patient literature regarding the procedure including a copy of the informed consent was given to the patient, all questions were answered to their satisfaction.    For left atrial appendage exclusion with Watchman    A detailed conversation was held today with the patient and family members if were present regarding stroke risk reduction in the setting of atrial fibrillation/atrial flutter. Options discussed were medial therapy with oral anticoagulation and left atrial exclusion with Watchman device implant. The use of diagrams, visual aids, imaging, illustrations, patient literature were employed to aid in the understanding of the pathophysiology of the disease process and for enhanced understanding management/treatment/interventions and others aspects of care/management.      The indication, benefit, reasonable expectation, risk and alternatives to Watchman implant were discussed and were reported to be fully understood with all questions answered.     Risk include but are not limited to vascular injury, bleeding with and without the need for blood transfusion, stroke, myocardial infarction, device dislodgement requiring emergency open heart surgery, incomplete occlusion of atrial appendage leading to continued oral anticoagulation and death (among others discussed).     A detailed conversation regarding oral anticoagulation and antiplatelet therapy in patient with Watchman device was discussed. In order to proceed one of the options would need to an option.     The indication for follow up imaging post implant with CT and/or TEE was also discussed.     Ideal post implant oral anticoagulation as bellow was discussed in detail.    A. ASA 81 mg + warfarin for INR 2-3 for 3 months followed by ASA 81-325 mg indefinitely as tolerated    B. ASA 81 mg + apixaban  5 mg bid for 3 months, followed by ASA 81-325 mg indefinitely as tolerated    C. ASA 81-325 mg + clopidogrel 75 mg for 6 months, followed by ASA 81-325 mg indefinitely         Based on a share decision making model, the patient elected to proceed with ablation of atrial fibrillation. The patient indicated  he fully understood the risk and elected to proceed for time to consider his options.       Recommendations/Plan:      Active Problems Include:  1. On continuous oral anticoagulation    2. PAF (paroxysmal atrial fibrillation) (HCC/RAF)    3. Pacemaker reprogramming/check        - Rhythm control: AAD; reduce to 100 mg per now Monday to Friday (only)  - Stroke risk reduction: OA   - Monitor for recurrent bleeding   - Discussed (again) LAAE   (GI bleeding)  - PPM check next visit 3 months  - Discussed concomitant procedure (AF ablation + LAAE)    Current plan of care was discussed in detail with the patient and all questions were answered.     Eric Melnick, MD  Attending Electrophysiologist  [facility]

## 2024-04-24 ENCOUNTER — Ambulatory Visit: Payer: PRIVATE HEALTH INSURANCE | Attending: Cardiovascular Disease

## 2024-04-24 DIAGNOSIS — I480 Paroxysmal atrial fibrillation: Secondary | ICD-10-CM

## 2024-04-24 DIAGNOSIS — I10 Essential (primary) hypertension: Secondary | ICD-10-CM

## 2024-04-24 DIAGNOSIS — Z952 Presence of prosthetic heart valve: Principal | ICD-10-CM

## 2024-04-24 NOTE — Progress Notes
 Cardiology Clinic    PATIENT: Andrew [name]  MRN: [id number]  DOB: 04-20-1955  DATE OF SERVICE: 04/24/2024      REFERRING PHYSICIAN: Jeana Francisco, MD    REASON FOR VISIT:  History of aortic valve replacement, routine follow-up    HISTORY OF PRESENT ILLNESS:  Andrew [name] is a delightful 69 y.o. gentleman with past medical history significant for bicuspid aortic valve status post aortic valve replacement in January of 2013 with perioperative CVA, chronic ST elevation in inferior leads, hypertension.  He was referred to our Cardiology Clinic by Andrew [name] to establish care.  I reviewed his previous records in detail.    Andrew [name] has a history of bicuspid aortic valve.  In January 2013 he underwent aortic valve replacement with bioprosthetic valve at [facility].  Unfortunately, after the procedure he had CVA with residual left leg paresis.  In the perioperative period he was found to have ST elevations in the inferior leads for which he underwent coronary angiogram which was normal.  He had repeat angiogram in May of 2013 for similar findings.  He also has a history of hypertension for which he is on amlodipine  and losartan .    Andrew [name] reports of doing well from cardiovascular standpoint.  He denies of having any chest pain, shortness of breath, orthopnea or paroxysmal nocturnal dyspnea.  Furthermore, he denies of having any dizziness, palpitations or lower extremity edema.  His blood pressure has been well controlled at home.    Andrew [name] is originally from [place] in [place].  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  He lives in [place].  He stopped smoking 40 years ago.  He drinks alcohol seldomly in social situations.  There is no history of illicit drug use.  His family history significant for brother who had stroke in his 80s.    Visit 09/21/2020  No change from cardiovascular standpoint.  He denies of having any chest pain, shortness of breath, orthopnea paroxysmal nocturnal dyspnea.  Furthermore, he denies of having any lightheadedness, lower extremity edema or palpitations.  Limited in ambulation due to left-sided hemiparesis as a consequence of stroke.  Had echocardiogram this morning.  Final read pending.  Normal valve function.  Good blood pressure control at home.    Visit 03/08/2021:  Doing well. Continues to do physical therapy. Feels stronger.   No chest pain, shortness of breath, palpitations or dizziness.   BP has been normal at home.  Limited ambulation due to hemiparesis, can walk with a walker.   Echocardiogram and CT chest stable.  Planning to leave for [place] in the next few weeks. Will stay for three months.     Visit 10/11/2021:  He continues to do well from cardiovascular perspective. More specifically he denies of having any chest pain, shortness of breath, orthopnea or paroxysmal nocturnal dyspnea. Furthermore, he denies of having any palpitations, LE edema or lightheadedness.   Completed physical theapy.   Limited ambulation due to hemiparesis, can walk with a walker.   BP has been normal  Stayed in [place] for 3 months    Visit 12/29/2021:  He continues to do well from cardiovascular perspective. More specifically he denies of having any chest pain, shortness of breath, orthopnea or paroxysmal nocturnal dyspnea. Furthermore, he denies of having any palpitations, LE edema or lightheadedness.   Can walk a little bit a walker.   BP has been in the 100 range.  Visit 05/04/2022:  Doing well. No chest pain, shortness of breath, palpitations or dizziness.   Can walk a little bit with a walker.   BP has been normal at home. Checking twice a day.   Still taking carvedilol , losartan  and amlodipine .     Visit 09/07/2022:  Just came back from [place].   Can walk with a walker.   BP normal at home.   No CP, SOB, palpitations or dizziness.   Had echocardiogram today. Final read pending.     Visit 05/29/2023:  Doing well from cardiovascular standpoint. Denies chest pain, SOB, palpitations or dizziness.   Walking very little with a walker.   BP has been normal at home.   Echocardiogram has been stable.    Visit 10/09/2023:  Recent hospitalization with tachy brady syndrome. Received PPM on 09/14/2023  Upon discharge developed melena. Found to have antral ulcerations. Status post clip  A little bit more SOB than before. No chest pain or palpitations. No dizziness.   BP has been normal at home.   Scheduled to see Andrew [name] on 10/16/2023  Going to [place] in one month. Will stay there for one month.      Visit 1015/2025:  Doing well from cardiovascular standpoint. Denies chest pain, SOB, palpitations or dizziness.   No bleeding.  Scheduled to see Andrew [name] tomorrow for [facility] care.   BP has been high at home.    INTERVAL HISTORY:  Visit 04/24/2024:  Doing well from cardiovascular standpoint. Denies chest pain, SOB, palpitations or dizziness.   BP has been normal. Taking losartan  75.   No bleeding  Seen Andrew [name]. Amiodarone  decreased. Ablation postponed.   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)    Hypertension    Gout    Seborrheic dermatitis    Urinary frequency    H/O aortic valve replacement    Bicuspid aortic valve    Age related osteoporosis    Prediabetes    H/O bicuspid aortic valve    H/O: CVA (cerebrovascular accident)    AF (paroxysmal atrial fibrillation) (HCC/RAF)    Tachy-brady syndrome (HCC/RAF)    Cardiac pacemaker in situ       PAST SURGICAL HISTORY:  Past Surgical History:   Procedure Laterality Date    AORTIC VALVE REPLACEMENT      CARDIAC PACEMAKER PLACEMENT  09/2023    CARDIAC VALVE REPLACEMENT      ESOPHAGOGASTRODUODENOSCOPY  09/2023    EYE SURGERY         ALLERGIES:  No Known Allergies    SOCIAL HISTORY:  Social History     Tobacco Use    Smoking status: Former     Current packs/day: 0.00     Average packs/day: 1 pack/day for 15.0 years (15.0 ttl pk-yrs)     Types: Cigarettes     Start date: 05/03/1975     Quit date: 05/02/1990     Years since quitting: 34.0    Smokeless tobacco: Never   Substance Use Topics    Alcohol use: Not Currently     Comment: occasionally has red wine    Drug use: Never       FAMILY HISTORY:  Family History   Problem Relation Age of Onset    No  Known Problems Mother     Other (gastric ulcer) Father     Uterine cancer Sister     Asthma Sister     Stroke Brother     Kidney failure Brother     Colon cancer Neg Hx     Lung cancer Neg Hx     Prostate cancer Neg Hx        REVIEW OF SYSTEMS:  All 14 systems were reviewed and were negative except as mentioned in the HPI    PHYSICAL EXAMINATION:  VITALS: BP 117/84  ~ Pulse 70  ~ Temp 36.6 ?C (97.8 ?F) (Forehead)  ~ Resp 17  ~ Ht 5' 6'' (1.676 m)  ~ SpO2 95%  ~ BMI 30.83 kg/m?    General: Alert and oriented, not in acute distress.  HEENT: Normocephalic, atraumatic. Sclerae are anicteric.   Neck: Supple, trachea midline, JVP flat  Respiratory: Clear to auscultation bilaterally, no wheezing or rales.   Cardiac: RRR, S1/S2 normal, no murmurs, rubs or gallops.   Abdomen: Soft, non-tender, non-distended, normoactive bowel sounds.  Extremities: No cyanosis, clubbing or edema.    Integument: Warm, no petechiae. No acute rash.  Neurologic/Psychiatric: Alert and oriented. Affect is appropriate.       OUTPATIENT MEDICATIONS:  Current Outpatient Medications   Medication Sig    ALLOPURINOL  100 mg tablet TAKE 2 TABLETS BY MOUTH EVERY DAY    amiodarone  200 mg tablet Take 1 tablet (200 mg total) by mouth daily.    amLODIPine  5 mg tablet Take 1 tablet (5 mg total) by mouth daily.    apixaban  5 mg tablet Take 1 tablet (5 mg total) by mouth two (2) times daily.    ascorbic acid  500 mg tablet Take 1 tablet (500 mg total) by mouth daily.    ATORVASTATIN  20 mg tablet TAKE 1 TABLET BY MOUTH EVERYDAY AT BEDTIME    losartan  50 mg tablet Take 1.5 tablets (75 mg total) by mouth daily.    metoprolol  tartrate 50 mg tablet Take 1 tablet (50 mg total) by mouth two (2) times daily.    Multiple Vitamins-Minerals (HCA SUPER THERAVITE-M PO) Take 1 tablet by mouth daily.    OMEPRAZOLE  20 mg DR capsule TAKE 1 CAPSULE BY MOUTH TWO TIMES DAILY. (Patient taking differently: Take 1 capsule (20 mg total) by mouth daily.)    Oyster Shell (CALCIUM  CARBONATE 1250 MG, 500 MG ELEMENTAL,) 1250 mg tablet Take 1 tablet (1,250 mg total) by mouth daily with breakfast.    vitamin D, cholecalciferol , 25 mcg (1000 units) tablet Take 1 tablet (25 mcg total) by mouth daily.    amoxicillin 500 mg capsule Use as directed prior to dental appointment (Patient not taking: Reported on 04/23/2024)     No current facility-administered medications for this visit.       LABORATORY:  Lab Results   Component Value Date    WBC 5.20 01/13/2024    HGB 13.5 01/13/2024    HCT 44.9 01/13/2024    MCV 73.6 (L) 01/13/2024    PLT 182 01/13/2024      Lab Results   Component Value Date    CREAT 0.85 01/13/2024    BUN 19 01/13/2024    NA 143 01/13/2024    K 4.4 01/13/2024    CL 105 01/13/2024    CO2 29 01/13/2024      Lab Results   Component Value Date    HGBA1C 6.2 (H) 01/13/2024      Lab Results   Component  Value Date    ALT 25 01/13/2024    AST 21 01/13/2024    ALKPHOS 92 01/13/2024    BILITOT 0.6 01/13/2024      Lab Results   Component Value Date    TSH 2.1 10/16/2023      Lab Results   Component Value Date    CALCIUM  9.4 01/13/2024    PHOS 4.1 09/23/2023      Lab Results   Component Value Date    CHOL 150 01/13/2024    CHOLHDL 46 01/13/2024    CHOLDLCAL 88 01/13/2024    CHOLDLQ 61 03/11/2012    TRIGLY 80 01/13/2024       Lab Results  (Last 360 days)        09/11/23 0158 08/05/23 1752 07/30/23 2146    Result             26                       52                       36                      DIAGNOSTIC STUDIES:  ECHO 04/20/2019:  1. Left ventricle is normal in size with mild hypertrophy. Left ventricular   function is preserved and visually estimated to be 60-65%. There are no wall   motion abnormalities.   2. Normal biatrial size.   3. Normal right ventricular size and systolic function.   4. Bioprosthetic valve (25-mm Carpentier-Edwards Magna Ease ; 1/13; Dr. Talitha)   is well seated in the aortic position. Peak velocity of 2.26 m/sec, mean   gradient of 11 mmHg, DI of 0.46, effective orifice area of 1.44 cm^2 (based   on LVOT diameter of 2cm)--consistent with normal prosthetic function.   5. Lack of an adequate TR signal precludes accurate estimation of right   ventricular systolic pressure. IVC diameter is = 2.1 cm with a > 50%   inspiratory collapse, suggestive of a right atrial pressure of 0-5 mmHg    ECHO 09/21/2020:   1. Normal LV size, mild septal hypertrophy, normal systolic function, normal LV diastolic function.   2. Left ventricular ejection fraction is approximately 60 to 65%.   3. Normal right ventricular size and normal systolic function.   4. A 25 mm Carpentier bioprosthetic valve is present in the aortic valve position. Echo findings are consistent with normal structure and function of the aortic prosthesis.   5. Normal pulmonary artery systolic pressure.   6. There are no prior echocardiograms available for comparison.    ECHO 09/06/2021:   1. Normal LV size, mild septal hypertrophy, normal wall motion and systolic function.   2. Left ventricular ejection fraction is approximately 60 to 65%.   3. Mild LV diastolic dysfunction (grade I, impaired relaxation).   4. Normal right ventricular size and grossly normal systolic function.   5. A 25 mm Carpentier bioprosthetic valve is present in the aortic valve position. It appears well seated. No paravalvular regurgitation. Peak aortic velocity is 1.9 m/sec and mean gradient is 8.0 mm Hg. By the continuity equation, the aortic valve area  is 1.74 cm? (Indexed AVA is 0.93 cm?/m?).   6. Upper normal aortic root (3.8 cm). Mildly enlarged mid ascending aorta (4.2 cm).   7. No significant valvular regurgitation.   8. Tricuspid regurgitant jet inadequate to  assess pulmonary artery systolic pressure.   9. A prior echo performed on 09/21/2020 was reviewed for comparison. No significant changes noted since the previous study.    ECHO 12/29/2021:   1. Normal LV size, mild concentric left ventricular hypertrophy, normal systolic function, indeterminate diastolic function.   2. Left ventricular ejection fraction is approximately 60 to 65%.   3. Normal right ventricular size and normal systolic function.   4. Status post Carpentier bioprosthetic placement in the aortic valve position without regurgitation. Echo findings are consistent with normal structure and function of the aortic prosthesis. The aortic valve area is 1.39 cm? (Indexed AVA is 0.74 cm?/m?).   5. Trace tricuspid regurgitation and normal pulmonary artery systolic pressure.   6. A prior echo performed on 09/06/2021 was reviewed for comparison. The mean AV gradient has increased from 8 mmHg to 12 mmHg. Aortic valve was 1.74 cm2 in the previous study.    ECHO 09/07/2022  1. Normal LV systolic function. Normal left ventricular regional wall motion.  Left ventricular ejection fraction is approximately 60-65%.  2. Small LV size and moderate concentric left ventricular hypertrophy.  3. Mild LV diastolic dysfunction (grade I, impaired relaxation).  4. Not well seen right ventricular size and normal systolic function.  5. Normal atrial sizes.  6. A 25 mm Carpentier bioprosthetic valve is present in the aortic valve  position. Echo findings are consistent with normal structure and function of  the aortic prosthesis with mean gradient of 14 mmHg and AVA 1.26cm2.  7. Trace mitral valve regurgitation.  8. Trace to mild tricuspid valve regurgitation.  9. Tricuspid regurgitant jet inadequate to assess pulmonary artery systolic  pressure.  10. Normal mean pulmonary artery pressure by PVAT.  11. Mildly enlarged mid ascending aorta (4.1 cm).  12. A prior echo performed on 12/29/2021 was reviewed for comparison.  Carpentier valve measurements and findings as above similar to prior study.    ECHO 09/16/2023   1. Technically very difficult study with suboptimal views.   2. Small LV size and severe concentric left ventricular hypertrophy.   3. Hyperdynamic LV systolic function. Normal left ventricular regional wall motion. Left ventricular ejection fraction is approximately >75%.   4. Mild LV diastolic dysfunction (grade I, impaired relaxation).   5. Normal right ventricular size. Normal RV systolic function.   6. A 25 mm Carpentier bioprosthetic valve is present in the aortic valve position. Echo findings are consistent with normal structure and function of the aortic prosthesis.   7. A prior echo performed on 08/08/2023 was reviewed for comparison. No significant changes noted since the previous study.    CT chest 09/21/2020:  Postoperative changes of aortic valve replacement. Ectasia of the ascending aorta measures up to 3.9 cm in the proximal arch grossly unchanged since 2013    PYP scan 09/20/2023  No scintigraphic evidence of ATTR cardiac amyloidosis.     ECG 06/07/2020:  Normal sinus rhythm at ventricular rate of 74 beats per minute.  Mild ST elevation in inferior leads.  Described before.     []  Reviewed/ordered radiology  []  Independently interpreted studies    [x]  Reviewed/ordered labs  [x]  Reviewed & summarized old records    [x]  Reviewed/ordered diag med test   []  Decided to get outside medical records      []  Obtained history from someone other than patient    ASSESSMENT/PLAN:  1. Aortic valve replacement in 2013  Normal valve function on most recent echocardiogram in 09/2023. Repeat echo yearly. Schedule  with our next office vist.   We discussed that some degeneration of the bioprosthetic valve can be expected 10 years after surgery so he will need close follow-up with serial echocardiograms  Continue aspirin     2. Bicuspid aortic valve  We discussed that there is an increased risk of having aortic aneurysm in patients with bicuspid aortic valve  Will need serial imaging. Most recent test in 08/2020. Stable from 2013. No change on echo in 09/2022 and 09/2023.   First-degree family members should be screened with echocardiograms    3. History of CVA  Residual leg weakness    4. Hyperlipidemia  On atorvastatin . LDL stable.     5. Abnormal EKG  Patient has ST elevations in the inferior leads which have been present before.  I reviewed the records.  No further testing is necessary in this regard.  He had 2 negative coronary angiograms    6. Hypertension  On metoprolol , losartan  and amlodipine . Increase losartan  to 75 mg daily. Take amlodipine  5 mg daily. Had better control on Coreg  in the past. Continue metoprolol  for rate control     7. Atrial fibrillation  8. Tachy brady syndrome  S/P PPM placement on 09/14/2023  Continue metoprolol   On anticoagulation with Eliquis   Follow up with Andrew [name]  On amiodarone . Dose decreased. Considered for ablation however no recent events.     9. GI bleed  Found to have antral ulcerations on EGD. S/P multiple clips  Stable       Please note, I spent a total of 30 minutes for this visit including chart review, test interpretation, history taking, physical examination, counseling, coordination of care and documentation.    Plan of care was discussed with the patient and family in detail. All questions answered.    Nikeshia Keetch, MD  Advanced Heart Failure and Transplantation Cardiology  Pager [phone]

## 2024-04-28 MED ORDER — OMEPRAZOLE 20 MG PO CPDR
20 mg | ORAL_CAPSULE | Freq: Two times a day (BID) | ORAL | 1 refills
Start: 2024-04-28 — End: ?

## 2024-04-29 ENCOUNTER — Telehealth: Payer: Commercial Managed Care - Pharmacy Benefit Manager

## 2024-04-29 MED ORDER — ALLOPURINOL 100 MG PO TABS
200 mg | ORAL_TABLET | Freq: Every day | ORAL | 1 refills
Start: 2024-04-29 — End: ?

## 2024-04-29 NOTE — Telephone Encounter
 Call Back Request      Reason for call back:  [name] wife Lanny would like to know if Dr. Jeana recommends patient to do any labs prior to his upcoming appointment on 08/05/24?    If so, caller would like the lab orders done at [facility].    Call back info:  [place]  [phone]    Any Symptoms:  []  Yes  [x]  No      If yes, what symptoms are you experiencing:    Duration of symptoms (how long):    Have you taken medication for symptoms (OTC or Rx):      If call was taken outside of clinic hours:    [] Patient or caller has been notified that this message was sent outside of normal clinic hours.     [] Patient or caller has been warm transferred to the physician's answering service. If applicable, patient or caller informed to please call us  back if symptoms progress.  Patient or caller has been notified of the turnaround time of 1-2 business day(s).

## 2024-04-30 MED ORDER — OMEPRAZOLE 20 MG PO CPDR
20 mg | ORAL_CAPSULE | Freq: Every day | ORAL | 3 refills | 30.00000 days | Status: AC
Start: 2024-04-30 — End: ?

## 2024-05-01 MED ORDER — ALLOPURINOL 100 MG PO TABS
200 mg | ORAL_TABLET | Freq: Every day | ORAL | 1 refills | 30.00000 days | Status: AC
Start: 2024-05-01 — End: ?
